# Patient Record
Sex: Female | Born: 1960 | Race: Black or African American | Hispanic: No | Marital: Married | Smoking: Never smoker
Health system: Southern US, Community
[De-identification: ages and names within clinical notes are randomized; demographics above are authoritative.]

## PROBLEM LIST (undated history)

## (undated) DIAGNOSIS — E739 Lactose intolerance, unspecified: Secondary | ICD-10-CM

## (undated) DIAGNOSIS — J439 Emphysema, unspecified: Secondary | ICD-10-CM

## (undated) DIAGNOSIS — M545 Low back pain, unspecified: Secondary | ICD-10-CM

## (undated) DIAGNOSIS — G43909 Migraine, unspecified, not intractable, without status migrainosus: Secondary | ICD-10-CM

## (undated) DIAGNOSIS — I10 Essential (primary) hypertension: Secondary | ICD-10-CM

## (undated) DIAGNOSIS — Z808 Family history of malignant neoplasm of other organs or systems: Secondary | ICD-10-CM

## (undated) DIAGNOSIS — J42 Unspecified chronic bronchitis: Secondary | ICD-10-CM

## (undated) DIAGNOSIS — M109 Gout, unspecified: Secondary | ICD-10-CM

## (undated) DIAGNOSIS — F32A Depression, unspecified: Secondary | ICD-10-CM

## (undated) DIAGNOSIS — K219 Gastro-esophageal reflux disease without esophagitis: Secondary | ICD-10-CM

## (undated) DIAGNOSIS — R609 Edema, unspecified: Secondary | ICD-10-CM

## (undated) DIAGNOSIS — Z8042 Family history of malignant neoplasm of prostate: Secondary | ICD-10-CM

## (undated) DIAGNOSIS — R569 Unspecified convulsions: Secondary | ICD-10-CM

## (undated) DIAGNOSIS — M069 Rheumatoid arthritis, unspecified: Secondary | ICD-10-CM

## (undated) DIAGNOSIS — F419 Anxiety disorder, unspecified: Secondary | ICD-10-CM

## (undated) DIAGNOSIS — F319 Bipolar disorder, unspecified: Secondary | ICD-10-CM

## (undated) DIAGNOSIS — E119 Type 2 diabetes mellitus without complications: Secondary | ICD-10-CM

## (undated) DIAGNOSIS — E785 Hyperlipidemia, unspecified: Secondary | ICD-10-CM

## (undated) DIAGNOSIS — R079 Chest pain, unspecified: Secondary | ICD-10-CM

## (undated) DIAGNOSIS — J449 Chronic obstructive pulmonary disease, unspecified: Secondary | ICD-10-CM

## (undated) DIAGNOSIS — M1990 Unspecified osteoarthritis, unspecified site: Secondary | ICD-10-CM

## (undated) DIAGNOSIS — Z800 Family history of malignant neoplasm of digestive organs: Secondary | ICD-10-CM

## (undated) DIAGNOSIS — J984 Other disorders of lung: Secondary | ICD-10-CM

## (undated) DIAGNOSIS — G8929 Other chronic pain: Secondary | ICD-10-CM

## (undated) DIAGNOSIS — F329 Major depressive disorder, single episode, unspecified: Secondary | ICD-10-CM

## (undated) DIAGNOSIS — Z6841 Body Mass Index (BMI) 40.0 and over, adult: Secondary | ICD-10-CM

## (undated) DIAGNOSIS — C50919 Malignant neoplasm of unspecified site of unspecified female breast: Secondary | ICD-10-CM

## (undated) DIAGNOSIS — Z9289 Personal history of other medical treatment: Secondary | ICD-10-CM

## (undated) DIAGNOSIS — Z803 Family history of malignant neoplasm of breast: Secondary | ICD-10-CM

## (undated) HISTORY — DX: Family history of malignant neoplasm of other organs or systems: Z80.8

## (undated) HISTORY — DX: Body Mass Index (BMI) 40.0 and over, adult: Z684

## (undated) HISTORY — DX: Malignant neoplasm of unspecified site of unspecified female breast: C50.919

## (undated) HISTORY — DX: Edema, unspecified: R60.9

## (undated) HISTORY — DX: Gout, unspecified: M10.9

## (undated) HISTORY — DX: Other disorders of lung: J98.4

## (undated) HISTORY — DX: Gastro-esophageal reflux disease without esophagitis: K21.9

## (undated) HISTORY — DX: Morbid (severe) obesity due to excess calories: E66.01

## (undated) HISTORY — DX: Hyperlipidemia, unspecified: E78.5

## (undated) HISTORY — DX: Lactose intolerance, unspecified: E73.9

## (undated) HISTORY — PX: BREAST LUMPECTOMY: SHX2

## (undated) HISTORY — DX: Bipolar disorder, unspecified: F31.9

## (undated) HISTORY — DX: Emphysema, unspecified: J43.9

## (undated) HISTORY — DX: Chest pain, unspecified: R07.9

## (undated) HISTORY — DX: Family history of malignant neoplasm of prostate: Z80.42

## (undated) HISTORY — DX: Other disorders of lung: J44.9

## (undated) HISTORY — DX: Family history of malignant neoplasm of breast: Z80.3

## (undated) HISTORY — DX: Chronic obstructive pulmonary disease, unspecified: J44.9

## (undated) HISTORY — DX: Personal history of other medical treatment: Z92.89

## (undated) HISTORY — PX: CRANIOTOMY: SHX93

## (undated) HISTORY — DX: Family history of malignant neoplasm of digestive organs: Z80.0

---

## 1998-02-14 ENCOUNTER — Encounter: Payer: Self-pay | Admitting: Pulmonary Disease

## 1998-02-14 ENCOUNTER — Ambulatory Visit (HOSPITAL_COMMUNITY): Admission: RE | Admit: 1998-02-14 | Discharge: 1998-02-14 | Payer: Self-pay | Admitting: Pulmonary Disease

## 2003-11-27 ENCOUNTER — Ambulatory Visit (HOSPITAL_COMMUNITY): Admission: RE | Admit: 2003-11-27 | Discharge: 2003-11-27 | Payer: Self-pay | Admitting: Family Medicine

## 2003-12-05 ENCOUNTER — Ambulatory Visit: Payer: Self-pay | Admitting: *Deleted

## 2003-12-07 ENCOUNTER — Emergency Department (HOSPITAL_COMMUNITY): Admission: EM | Admit: 2003-12-07 | Discharge: 2003-12-07 | Payer: Self-pay | Admitting: *Deleted

## 2003-12-19 ENCOUNTER — Ambulatory Visit: Payer: Self-pay | Admitting: *Deleted

## 2003-12-25 ENCOUNTER — Ambulatory Visit: Payer: Self-pay | Admitting: *Deleted

## 2004-01-23 ENCOUNTER — Ambulatory Visit: Payer: Self-pay | Admitting: Internal Medicine

## 2004-02-26 ENCOUNTER — Ambulatory Visit: Payer: Self-pay | Admitting: Internal Medicine

## 2004-05-10 ENCOUNTER — Ambulatory Visit (HOSPITAL_COMMUNITY): Admission: RE | Admit: 2004-05-10 | Discharge: 2004-05-10 | Payer: Self-pay | Admitting: Internal Medicine

## 2004-05-10 ENCOUNTER — Ambulatory Visit: Payer: Self-pay | Admitting: Family Medicine

## 2004-05-16 ENCOUNTER — Ambulatory Visit: Payer: Self-pay | Admitting: Family Medicine

## 2004-07-30 ENCOUNTER — Ambulatory Visit: Payer: Self-pay | Admitting: Internal Medicine

## 2004-08-01 ENCOUNTER — Ambulatory Visit: Payer: Self-pay | Admitting: Internal Medicine

## 2004-10-15 ENCOUNTER — Ambulatory Visit: Payer: Self-pay | Admitting: Internal Medicine

## 2004-12-23 ENCOUNTER — Ambulatory Visit: Payer: Self-pay | Admitting: Internal Medicine

## 2005-01-06 ENCOUNTER — Ambulatory Visit: Payer: Self-pay | Admitting: Family Medicine

## 2005-01-09 ENCOUNTER — Ambulatory Visit (HOSPITAL_COMMUNITY): Admission: RE | Admit: 2005-01-09 | Discharge: 2005-01-09 | Payer: Self-pay | Admitting: Internal Medicine

## 2005-04-21 ENCOUNTER — Ambulatory Visit: Payer: Self-pay | Admitting: Internal Medicine

## 2005-05-19 ENCOUNTER — Ambulatory Visit: Payer: Self-pay | Admitting: Internal Medicine

## 2005-05-20 ENCOUNTER — Ambulatory Visit: Payer: Self-pay | Admitting: Internal Medicine

## 2005-05-20 ENCOUNTER — Encounter: Payer: Self-pay | Admitting: Internal Medicine

## 2005-05-26 ENCOUNTER — Ambulatory Visit: Payer: Self-pay | Admitting: Family Medicine

## 2005-05-28 ENCOUNTER — Ambulatory Visit (HOSPITAL_COMMUNITY): Admission: RE | Admit: 2005-05-28 | Discharge: 2005-05-28 | Payer: Self-pay | Admitting: Family Medicine

## 2005-06-26 ENCOUNTER — Ambulatory Visit: Payer: Self-pay | Admitting: Internal Medicine

## 2005-07-09 ENCOUNTER — Ambulatory Visit: Payer: Self-pay | Admitting: Internal Medicine

## 2005-07-09 ENCOUNTER — Ambulatory Visit (HOSPITAL_COMMUNITY): Admission: RE | Admit: 2005-07-09 | Discharge: 2005-07-09 | Payer: Self-pay | Admitting: Internal Medicine

## 2005-10-29 ENCOUNTER — Ambulatory Visit: Payer: Self-pay | Admitting: Family Medicine

## 2005-12-23 ENCOUNTER — Ambulatory Visit: Payer: Self-pay | Admitting: Internal Medicine

## 2005-12-24 ENCOUNTER — Ambulatory Visit: Payer: Self-pay | Admitting: Family Medicine

## 2006-05-29 ENCOUNTER — Ambulatory Visit (HOSPITAL_COMMUNITY): Admission: RE | Admit: 2006-05-29 | Discharge: 2006-05-29 | Payer: Self-pay | Admitting: Family Medicine

## 2006-06-12 ENCOUNTER — Encounter: Admission: RE | Admit: 2006-06-12 | Discharge: 2006-06-12 | Payer: Self-pay | Admitting: Family Medicine

## 2006-06-16 ENCOUNTER — Ambulatory Visit: Payer: Self-pay | Admitting: Internal Medicine

## 2006-07-01 ENCOUNTER — Ambulatory Visit: Payer: Self-pay | Admitting: Family Medicine

## 2006-07-29 ENCOUNTER — Ambulatory Visit: Payer: Self-pay | Admitting: Family Medicine

## 2006-10-19 ENCOUNTER — Ambulatory Visit: Payer: Self-pay | Admitting: Internal Medicine

## 2006-10-20 DIAGNOSIS — I10 Essential (primary) hypertension: Secondary | ICD-10-CM

## 2006-10-20 DIAGNOSIS — E1159 Type 2 diabetes mellitus with other circulatory complications: Secondary | ICD-10-CM | POA: Insufficient documentation

## 2006-11-03 DIAGNOSIS — F3289 Other specified depressive episodes: Secondary | ICD-10-CM | POA: Insufficient documentation

## 2006-11-03 DIAGNOSIS — F329 Major depressive disorder, single episode, unspecified: Secondary | ICD-10-CM

## 2006-12-16 ENCOUNTER — Encounter (INDEPENDENT_AMBULATORY_CARE_PROVIDER_SITE_OTHER): Payer: Self-pay | Admitting: *Deleted

## 2007-01-26 ENCOUNTER — Ambulatory Visit: Payer: Self-pay | Admitting: Internal Medicine

## 2007-06-28 ENCOUNTER — Ambulatory Visit: Payer: Self-pay | Admitting: Internal Medicine

## 2007-07-15 ENCOUNTER — Encounter (INDEPENDENT_AMBULATORY_CARE_PROVIDER_SITE_OTHER): Payer: Self-pay | Admitting: Family Medicine

## 2007-07-15 ENCOUNTER — Ambulatory Visit: Payer: Self-pay | Admitting: Internal Medicine

## 2007-07-15 LAB — CONVERTED CEMR LAB
Albumin: 3.6 g/dL (ref 3.5–5.2)
CO2: 24 meq/L (ref 19–32)
Calcium: 8.9 mg/dL (ref 8.4–10.5)
Chloride: 105 meq/L (ref 96–112)
Creatinine, Ser: 0.76 mg/dL (ref 0.40–1.20)
Glucose, Bld: 96 mg/dL (ref 70–99)
Total Protein: 7.6 g/dL (ref 6.0–8.3)
Triglycerides: 182 mg/dL — ABNORMAL HIGH (ref <150)
VLDL: 36 mg/dL (ref 0–40)

## 2007-08-24 ENCOUNTER — Emergency Department (HOSPITAL_COMMUNITY): Admission: EM | Admit: 2007-08-24 | Discharge: 2007-08-24 | Payer: Self-pay | Admitting: Emergency Medicine

## 2007-09-08 ENCOUNTER — Ambulatory Visit: Payer: Self-pay | Admitting: Internal Medicine

## 2008-01-11 ENCOUNTER — Ambulatory Visit: Payer: Self-pay | Admitting: Internal Medicine

## 2008-01-11 LAB — CONVERTED CEMR LAB
ALT: 18 units/L (ref 0–35)
Albumin: 3.8 g/dL (ref 3.5–5.2)
Chloride: 105 meq/L (ref 96–112)
Creatinine, Ser: 0.71 mg/dL (ref 0.40–1.20)
Eosinophils Absolute: 0.1 10*3/uL (ref 0.0–0.7)
Glucose, Bld: 96 mg/dL (ref 70–99)
Hemoglobin: 12.6 g/dL (ref 12.0–15.0)
Lymphocytes Relative: 39 % (ref 12–46)
MCHC: 32.9 g/dL (ref 30.0–36.0)
MCV: 93 fL (ref 78.0–100.0)
Monocytes Relative: 10 % (ref 3–12)
Potassium: 4.3 meq/L (ref 3.5–5.3)
RDW: 15.8 % — ABNORMAL HIGH (ref 11.5–15.5)
Sodium: 139 meq/L (ref 135–145)
WBC: 7.1 10*3/uL (ref 4.0–10.5)

## 2008-01-14 ENCOUNTER — Encounter: Admission: RE | Admit: 2008-01-14 | Discharge: 2008-01-14 | Payer: Self-pay | Admitting: Internal Medicine

## 2008-01-31 ENCOUNTER — Ambulatory Visit: Payer: Self-pay | Admitting: Family Medicine

## 2008-02-15 ENCOUNTER — Ambulatory Visit: Payer: Self-pay | Admitting: Family Medicine

## 2008-03-02 ENCOUNTER — Ambulatory Visit: Payer: Self-pay | Admitting: Internal Medicine

## 2008-03-02 ENCOUNTER — Encounter (INDEPENDENT_AMBULATORY_CARE_PROVIDER_SITE_OTHER): Payer: Self-pay | Admitting: Adult Health

## 2008-03-02 LAB — CONVERTED CEMR LAB
Basophils Relative: 0 % (ref 0–1)
HCT: 41 % (ref 36.0–46.0)
Hemoglobin: 13.7 g/dL (ref 12.0–15.0)
Lymphocytes Relative: 45 % (ref 12–46)
MCHC: 33.4 g/dL (ref 30.0–36.0)
Platelets: 351 10*3/uL (ref 150–400)
RBC: 4.34 M/uL (ref 3.87–5.11)
WBC: 8.5 10*3/uL (ref 4.0–10.5)

## 2008-03-08 ENCOUNTER — Ambulatory Visit (HOSPITAL_COMMUNITY): Admission: RE | Admit: 2008-03-08 | Discharge: 2008-03-08 | Payer: Self-pay | Admitting: Internal Medicine

## 2008-03-14 ENCOUNTER — Ambulatory Visit (HOSPITAL_COMMUNITY): Admission: RE | Admit: 2008-03-14 | Discharge: 2008-03-14 | Payer: Self-pay | Admitting: Internal Medicine

## 2008-03-14 ENCOUNTER — Encounter: Payer: Self-pay | Admitting: Internal Medicine

## 2008-04-10 ENCOUNTER — Ambulatory Visit (HOSPITAL_COMMUNITY): Admission: RE | Admit: 2008-04-10 | Discharge: 2008-04-10 | Payer: Self-pay | Admitting: Internal Medicine

## 2008-08-18 ENCOUNTER — Ambulatory Visit: Payer: Self-pay | Admitting: Internal Medicine

## 2008-08-22 ENCOUNTER — Ambulatory Visit: Payer: Self-pay | Admitting: Internal Medicine

## 2008-09-29 ENCOUNTER — Ambulatory Visit: Payer: Self-pay | Admitting: Internal Medicine

## 2008-10-04 ENCOUNTER — Ambulatory Visit: Payer: Self-pay | Admitting: Internal Medicine

## 2008-11-23 ENCOUNTER — Telehealth (INDEPENDENT_AMBULATORY_CARE_PROVIDER_SITE_OTHER): Payer: Self-pay | Admitting: *Deleted

## 2008-11-23 ENCOUNTER — Ambulatory Visit: Payer: Self-pay | Admitting: Internal Medicine

## 2008-11-24 ENCOUNTER — Encounter (INDEPENDENT_AMBULATORY_CARE_PROVIDER_SITE_OTHER): Payer: Self-pay | Admitting: Adult Health

## 2008-11-24 ENCOUNTER — Ambulatory Visit: Payer: Self-pay | Admitting: Internal Medicine

## 2008-11-24 LAB — CONVERTED CEMR LAB
Lymphs Abs: 4 10*3/uL (ref 0.7–4.0)
MCV: 95.8 fL (ref 78.0–100.0)
Neutrophils Relative %: 42 % — ABNORMAL LOW (ref 43–77)
Platelets: 317 10*3/uL (ref 150–400)
RBC: 4.05 M/uL (ref 3.87–5.11)
RDW: 15.4 % (ref 11.5–15.5)
WBC: 7.6 10*3/uL (ref 4.0–10.5)

## 2008-11-28 ENCOUNTER — Encounter: Admission: RE | Admit: 2008-11-28 | Discharge: 2008-11-28 | Payer: Self-pay | Admitting: Internal Medicine

## 2008-12-07 ENCOUNTER — Ambulatory Visit: Payer: Self-pay | Admitting: Internal Medicine

## 2008-12-07 ENCOUNTER — Encounter (INDEPENDENT_AMBULATORY_CARE_PROVIDER_SITE_OTHER): Payer: Self-pay | Admitting: Adult Health

## 2008-12-07 LAB — CONVERTED CEMR LAB
ALT: 21 units/L (ref 0–35)
Albumin: 3.6 g/dL (ref 3.5–5.2)
BUN: 9 mg/dL (ref 6–23)
Calcium: 8.8 mg/dL (ref 8.4–10.5)
Cholesterol: 138 mg/dL (ref 0–200)
Glucose, Bld: 104 mg/dL — ABNORMAL HIGH (ref 70–99)
HDL: 71 mg/dL (ref >39)
LDL Cholesterol: 39 mg/dL (ref 0–99)
Sodium: 141 meq/L (ref 135–145)
Total Bilirubin: 0.2 mg/dL — ABNORMAL LOW (ref 0.3–1.2)
Total CHOL/HDL Ratio: 1.9
Triglycerides: 139 mg/dL (ref <150)
VLDL: 28 mg/dL (ref 0–40)

## 2009-01-01 ENCOUNTER — Ambulatory Visit: Payer: Self-pay | Admitting: Family Medicine

## 2009-01-31 ENCOUNTER — Ambulatory Visit: Payer: Self-pay | Admitting: Family Medicine

## 2009-01-31 ENCOUNTER — Encounter (INDEPENDENT_AMBULATORY_CARE_PROVIDER_SITE_OTHER): Payer: Self-pay | Admitting: Adult Health

## 2009-02-07 ENCOUNTER — Ambulatory Visit (HOSPITAL_COMMUNITY): Admission: RE | Admit: 2009-02-07 | Discharge: 2009-02-07 | Payer: Self-pay | Admitting: Internal Medicine

## 2009-02-14 ENCOUNTER — Ambulatory Visit: Payer: Self-pay | Admitting: Internal Medicine

## 2009-02-21 ENCOUNTER — Ambulatory Visit: Payer: Self-pay | Admitting: Internal Medicine

## 2009-02-21 ENCOUNTER — Encounter (INDEPENDENT_AMBULATORY_CARE_PROVIDER_SITE_OTHER): Payer: Self-pay | Admitting: Adult Health

## 2009-02-21 LAB — CONVERTED CEMR LAB: Valproic Acid Lvl: 50.8 ug/mL (ref 50.0–100.0)

## 2009-03-09 ENCOUNTER — Ambulatory Visit: Payer: Self-pay | Admitting: Family Medicine

## 2009-03-27 ENCOUNTER — Ambulatory Visit: Payer: Self-pay | Admitting: Internal Medicine

## 2009-05-08 ENCOUNTER — Encounter (INDEPENDENT_AMBULATORY_CARE_PROVIDER_SITE_OTHER): Payer: Self-pay | Admitting: Adult Health

## 2009-05-08 ENCOUNTER — Ambulatory Visit: Payer: Self-pay | Admitting: Internal Medicine

## 2009-05-08 LAB — CONVERTED CEMR LAB
Albumin: 4.1 g/dL (ref 3.5–5.2)
BUN: 7 mg/dL (ref 6–23)
HDL: 84 mg/dL (ref >39)
LDL Cholesterol: 35 mg/dL (ref 0–99)
Sodium: 140 meq/L (ref 135–145)
Total CHOL/HDL Ratio: 1.6
VLDL: 16 mg/dL (ref 0–40)

## 2009-05-11 ENCOUNTER — Ambulatory Visit: Payer: Self-pay | Admitting: Internal Medicine

## 2009-06-08 ENCOUNTER — Ambulatory Visit: Payer: Self-pay | Admitting: Internal Medicine

## 2009-06-08 ENCOUNTER — Encounter (INDEPENDENT_AMBULATORY_CARE_PROVIDER_SITE_OTHER): Payer: Self-pay | Admitting: Adult Health

## 2009-06-08 LAB — CONVERTED CEMR LAB
ALT: 19 U/L
AST: 20 U/L
Albumin: 3.8 g/dL
Alkaline Phosphatase: 59 U/L
BUN: 12 mg/dL
Basophils Absolute: 0 K/uL
Basophils Relative: 0 %
CO2: 24 meq/L
Calcium: 9.3 mg/dL
Chloride: 103 meq/L
Creatinine, Ser: 0.83 mg/dL
Eosinophils Absolute: 0.1 K/uL
Eosinophils Relative: 2 %
FSH: 39.9 m[IU]/mL
Glucose, Bld: 100 mg/dL — ABNORMAL HIGH
HCT: 38.7 %
Hemoglobin: 12.7 g/dL
LH: 23.2 m[IU]/mL
Lymphocytes Relative: 46 %
Lymphs Abs: 3 K/uL
MCHC: 32.8 g/dL
MCV: 94.4 fL
Monocytes Absolute: 0.6 K/uL
Monocytes Relative: 8 %
Neutro Abs: 2.8 K/uL
Neutrophils Relative %: 43 %
Platelets: 269 K/uL
Potassium: 3.9 meq/L
RBC: 4.1 M/uL
RDW: 14.3 %
Sodium: 138 meq/L
TSH: 1.296 u[IU]/mL
Total Bilirubin: 0.3 mg/dL
Total Protein: 7.3 g/dL
WBC: 6.5 10*3/microliter

## 2009-06-14 ENCOUNTER — Ambulatory Visit: Payer: Self-pay | Admitting: Internal Medicine

## 2009-07-16 ENCOUNTER — Ambulatory Visit: Payer: Self-pay | Admitting: Internal Medicine

## 2009-07-27 ENCOUNTER — Ambulatory Visit: Payer: Self-pay | Admitting: Internal Medicine

## 2009-07-31 ENCOUNTER — Ambulatory Visit: Payer: Self-pay | Admitting: Internal Medicine

## 2009-08-07 ENCOUNTER — Ambulatory Visit: Payer: Self-pay | Admitting: Internal Medicine

## 2009-08-08 ENCOUNTER — Ambulatory Visit: Payer: Self-pay | Admitting: Internal Medicine

## 2009-10-12 ENCOUNTER — Ambulatory Visit: Payer: Self-pay | Admitting: Internal Medicine

## 2009-10-17 ENCOUNTER — Ambulatory Visit: Payer: Self-pay | Admitting: Internal Medicine

## 2009-11-08 ENCOUNTER — Ambulatory Visit: Payer: Self-pay | Admitting: Internal Medicine

## 2009-12-12 ENCOUNTER — Ambulatory Visit: Payer: Self-pay | Admitting: Internal Medicine

## 2010-01-16 ENCOUNTER — Encounter (INDEPENDENT_AMBULATORY_CARE_PROVIDER_SITE_OTHER): Payer: Self-pay | Admitting: *Deleted

## 2010-01-16 LAB — CONVERTED CEMR LAB
ALT: 19 units/L (ref 0–35)
AST: 23 units/L (ref 0–37)
Albumin: 3.6 g/dL (ref 3.5–5.2)
Alkaline Phosphatase: 71 units/L (ref 39–117)
BUN: 13 mg/dL (ref 6–23)
Basophils Absolute: 0 10*3/uL (ref 0.0–0.1)
CO2: 25 meq/L (ref 19–32)
Calcium: 9.1 mg/dL (ref 8.4–10.5)
Chloride: 105 meq/L (ref 96–112)
Neutro Abs: 4.2 10*3/uL (ref 1.7–7.7)
Platelets: 346 10*3/uL (ref 150–400)
Sodium: 139 meq/L (ref 135–145)
Total Protein: 6.7 g/dL (ref 6.0–8.3)
Valproic Acid Lvl: 47.8 ug/mL — ABNORMAL LOW (ref 50.0–100.0)

## 2010-02-08 ENCOUNTER — Ambulatory Visit (HOSPITAL_COMMUNITY): Admission: RE | Admit: 2010-02-08 | Discharge: 2010-02-08 | Payer: Self-pay | Admitting: Internal Medicine

## 2010-02-15 ENCOUNTER — Ambulatory Visit (HOSPITAL_BASED_OUTPATIENT_CLINIC_OR_DEPARTMENT_OTHER)
Admission: RE | Admit: 2010-02-15 | Discharge: 2010-02-15 | Payer: Self-pay | Source: Home / Self Care | Admitting: Family Medicine

## 2010-02-23 ENCOUNTER — Ambulatory Visit: Payer: Self-pay | Admitting: Internal Medicine

## 2010-03-13 ENCOUNTER — Encounter (INDEPENDENT_AMBULATORY_CARE_PROVIDER_SITE_OTHER): Payer: Self-pay | Admitting: Family Medicine

## 2010-03-13 LAB — CONVERTED CEMR LAB
Alkaline Phosphatase: 79 units/L (ref 39–117)
CO2: 28 meq/L (ref 19–32)
Eosinophils Absolute: 0.1 10*3/uL (ref 0.0–0.7)
Eosinophils Relative: 2 % (ref 0–5)
HCT: 39.1 % (ref 36.0–46.0)
Hemoglobin: 12.6 g/dL (ref 12.0–15.0)
Lymphocytes Relative: 39 % (ref 12–46)
MCHC: 32.2 g/dL (ref 30.0–36.0)
MCV: 97 fL (ref 78.0–100.0)
Monocytes Relative: 9 % (ref 3–12)
Neutrophils Relative %: 50 % (ref 43–77)
Platelets: 358 10*3/uL (ref 150–400)
RBC: 4.03 M/uL (ref 3.87–5.11)
RDW: 14.9 % (ref 11.5–15.5)
Sodium: 141 meq/L (ref 135–145)
WBC: 7.7 10*3/uL (ref 4.0–10.5)

## 2010-07-19 ENCOUNTER — Ambulatory Visit (INDEPENDENT_AMBULATORY_CARE_PROVIDER_SITE_OTHER): Payer: Self-pay

## 2010-07-19 ENCOUNTER — Inpatient Hospital Stay (INDEPENDENT_AMBULATORY_CARE_PROVIDER_SITE_OTHER)
Admission: RE | Admit: 2010-07-19 | Discharge: 2010-07-19 | Disposition: A | Discharge status: Home / Self Care | Payer: Self-pay | Source: Ambulatory Visit | Attending: Family Medicine | Admitting: Family Medicine

## 2010-07-19 DIAGNOSIS — R05 Cough: Secondary | ICD-10-CM

## 2010-07-19 DIAGNOSIS — R059 Cough, unspecified: Secondary | ICD-10-CM

## 2010-07-19 LAB — POCT I-STAT, CHEM 8
Calcium, Ion: 1.21 mmol/L (ref 1.12–1.32)
Creatinine, Ser: 1.1 mg/dL (ref 0.4–1.2)
Glucose, Bld: 80 mg/dL (ref 70–99)
Sodium: 141 mEq/L (ref 135–145)

## 2010-07-19 LAB — GLUCOSE, CAPILLARY: Glucose-Capillary: 79 mg/dL (ref 70–99)

## 2010-07-21 ENCOUNTER — Emergency Department (HOSPITAL_COMMUNITY)
Admission: EM | Admit: 2010-07-21 | Discharge: 2010-07-21 | Disposition: A | Discharge status: Home / Self Care | Payer: Self-pay | Attending: Emergency Medicine | Admitting: Emergency Medicine

## 2010-07-21 DIAGNOSIS — R609 Edema, unspecified: Secondary | ICD-10-CM | POA: Insufficient documentation

## 2010-07-21 DIAGNOSIS — R071 Chest pain on breathing: Secondary | ICD-10-CM | POA: Insufficient documentation

## 2010-07-21 DIAGNOSIS — E785 Hyperlipidemia, unspecified: Secondary | ICD-10-CM | POA: Insufficient documentation

## 2010-07-21 DIAGNOSIS — R079 Chest pain, unspecified: Secondary | ICD-10-CM | POA: Insufficient documentation

## 2010-07-21 DIAGNOSIS — R51 Headache: Secondary | ICD-10-CM | POA: Insufficient documentation

## 2010-07-21 DIAGNOSIS — R634 Abnormal weight loss: Secondary | ICD-10-CM | POA: Insufficient documentation

## 2010-07-21 DIAGNOSIS — Z79899 Other long term (current) drug therapy: Secondary | ICD-10-CM | POA: Insufficient documentation

## 2010-07-21 DIAGNOSIS — R059 Cough, unspecified: Secondary | ICD-10-CM | POA: Insufficient documentation

## 2010-07-21 DIAGNOSIS — K117 Disturbances of salivary secretion: Secondary | ICD-10-CM | POA: Insufficient documentation

## 2010-07-21 DIAGNOSIS — R05 Cough: Secondary | ICD-10-CM | POA: Insufficient documentation

## 2010-07-21 DIAGNOSIS — R350 Frequency of micturition: Secondary | ICD-10-CM | POA: Insufficient documentation

## 2010-07-21 DIAGNOSIS — H16219 Exposure keratoconjunctivitis, unspecified eye: Secondary | ICD-10-CM | POA: Insufficient documentation

## 2010-07-21 DIAGNOSIS — E119 Type 2 diabetes mellitus without complications: Secondary | ICD-10-CM | POA: Insufficient documentation

## 2010-07-21 DIAGNOSIS — I10 Essential (primary) hypertension: Secondary | ICD-10-CM | POA: Insufficient documentation

## 2010-07-21 DIAGNOSIS — R3915 Urgency of urination: Secondary | ICD-10-CM | POA: Insufficient documentation

## 2010-07-21 LAB — URINALYSIS, ROUTINE W REFLEX MICROSCOPIC
Bilirubin Urine: NEGATIVE
Glucose, UA: NEGATIVE mg/dL
Hgb urine dipstick: NEGATIVE
Ketones, ur: NEGATIVE mg/dL
Nitrite: NEGATIVE
Protein, ur: NEGATIVE mg/dL
Urobilinogen, UA: 0.2 mg/dL (ref 0.0–1.0)
pH: 7.5 (ref 5.0–8.0)

## 2010-07-21 LAB — DIFFERENTIAL
Basophils Absolute: 0 10*3/uL (ref 0.0–0.1)
Basophils Relative: 0 % (ref 0–1)
Eosinophils Relative: 2 % (ref 0–5)
Lymphocytes Relative: 41 % (ref 12–46)
Monocytes Absolute: 0.7 10*3/uL (ref 0.1–1.0)
Neutrophils Relative %: 49 % (ref 43–77)

## 2010-07-21 LAB — BASIC METABOLIC PANEL
GFR calc Af Amer: >60 mL/min (ref >60)
GFR calc non Af Amer: >60 mL/min (ref >60)
Glucose, Bld: 89 mg/dL (ref 70–99)
Potassium: 3.5 mEq/L (ref 3.5–5.1)
Sodium: 139 mEq/L (ref 135–145)

## 2010-07-21 LAB — CBC
Hemoglobin: 13.9 g/dL (ref 12.0–15.0)
MCH: 32.2 pg (ref 26.0–34.0)
MCV: 92.8 fL (ref 78.0–100.0)
WBC: 9.2 10*3/uL (ref 4.0–10.5)

## 2010-07-21 LAB — D-DIMER, QUANTITATIVE: D-Dimer, Quant: <0.22 ug/mL-FEU (ref 0.00–0.48)

## 2010-10-30 HISTORY — PX: CATARACT EXTRACTION: SUR2

## 2010-11-20 ENCOUNTER — Ambulatory Visit (HOSPITAL_COMMUNITY)
Admission: RE | Admit: 2010-11-20 | Discharge: 2010-11-20 | Disposition: A | Discharge status: Home / Self Care | Payer: Self-pay | Source: Ambulatory Visit | Attending: Ophthalmology | Admitting: Ophthalmology

## 2010-11-20 DIAGNOSIS — Z01812 Encounter for preprocedural laboratory examination: Secondary | ICD-10-CM | POA: Insufficient documentation

## 2010-11-20 DIAGNOSIS — H269 Unspecified cataract: Secondary | ICD-10-CM | POA: Insufficient documentation

## 2010-11-20 DIAGNOSIS — G4733 Obstructive sleep apnea (adult) (pediatric): Secondary | ICD-10-CM | POA: Insufficient documentation

## 2010-11-20 DIAGNOSIS — K449 Diaphragmatic hernia without obstruction or gangrene: Secondary | ICD-10-CM | POA: Insufficient documentation

## 2010-11-20 DIAGNOSIS — I10 Essential (primary) hypertension: Secondary | ICD-10-CM | POA: Insufficient documentation

## 2010-11-20 DIAGNOSIS — Z01810 Encounter for preprocedural cardiovascular examination: Secondary | ICD-10-CM | POA: Insufficient documentation

## 2010-11-20 DIAGNOSIS — F319 Bipolar disorder, unspecified: Secondary | ICD-10-CM | POA: Insufficient documentation

## 2010-11-20 DIAGNOSIS — E119 Type 2 diabetes mellitus without complications: Secondary | ICD-10-CM | POA: Insufficient documentation

## 2010-11-20 LAB — BASIC METABOLIC PANEL
CO2: 28 mEq/L (ref 19–32)
Chloride: 105 mEq/L (ref 96–112)
Creatinine, Ser: 0.7 mg/dL (ref 0.50–1.10)
Potassium: 3.9 mEq/L (ref 3.5–5.1)
Sodium: 143 mEq/L (ref 135–145)

## 2010-11-20 LAB — CBC
Hemoglobin: 13.8 g/dL (ref 12.0–15.0)
MCH: 32.9 pg (ref 26.0–34.0)
RBC: 4.2 MIL/uL (ref 3.87–5.11)
WBC: 8 10*3/uL (ref 4.0–10.5)

## 2010-11-20 LAB — GLUCOSE, CAPILLARY
Glucose-Capillary: 58 mg/dL — ABNORMAL LOW (ref 70–99)
Glucose-Capillary: 59 mg/dL — ABNORMAL LOW (ref 70–99)
Glucose-Capillary: 92 mg/dL (ref 70–99)

## 2010-11-20 LAB — HCG, SERUM, QUALITATIVE: Preg, Serum: NEGATIVE

## 2010-12-18 NOTE — Op Note (Signed)
  NAMEBROOKS, STOTZ               ACCOUNT NO.:  [id number]  MEDICAL RECORD NO.:  [id number]  LOCATION:  SDSC                         FACILITY:  [facility]  PHYSICIAN:  Shade Flood, MD       DATE OF BIRTH:  [birth year]  DATE OF PROCEDURE:  11/20/2010 DATE OF DISCHARGE:                              OPERATIVE REPORT   PREOPERATIVE DIAGNOSIS:  Cataract, left eye.  POSTOPERATIVE DIAGNOSIS:  Cataract, left eye.  PROCEDURE PERFORMED:  Phacoemulsification with posterior chamber intraocular lens, left eye.  SURGEON:  Shade Flood, MD.  ESTIMATED BLOOD LOSS:  Less than 1 mL.  There no complications and no specimens for pathology.  The patient was prepared and draped in the usual fashion for ocular surgery on the left eye and a solid lid speculum was placed.  A peripheral clear corneal incision was made centered at the 11 o'clock meridian for 3.5 mm used using a Grieshaber O1 blade.  A separate stab incision was made at the 2:30 meridian of the clear cornea using a 15- degree blade.  The keratome was then used to make a self-sealing corneal incision centered at the 11 o'clock meridian.  Provisc was instilled into the anterior chamber.  A bent 25-gauge needle was used to perform a capsulorrhexis.  The lens was then hydrodissected and hydro-delineated using the irrigation cannula.  The Chang chopper was inserted and used to rotate the lens within the capsular bag.  The outer cortex was noted to be very adherent, though I was able to remove the nucleus within the outer cortical shell.  The Chang chopper and phaco handpiece were inserted and a combined phaco chop technique was employed fracturing the lens nucleus into sections with subsequent removal with the phaco handpiece.  The IA cannula was then used to remove the thick cortical shell.  It was very adherent to the capsule, but we were able to remove it uneventfully.  The wound was then enlarged to its full extent of 3.5 mm and Amvisc  was instilled.  A Monarch injector was used to place folded AcrySOf MA50BM lens +20.5 into the capsule bag.  The Sinskey lens hook was then used to dial the trailing haptic into the capsular bag. The IA cannula was then used to remove viscoelastic from the anterior chamber.  Balanced salt solution was placed through the stab incision at 2:30 and used to reconstitute globe volume.  The incision at 11 o'clock was tested with Weck-cel was found to be watertight.  The patient was given 4 mg of Decadron subconjunctivally, 100 mg of Ancef subconjunctivally using a 30-gauge needle with the tip of the needle visualized.  The lid speculum was then removed and the patient's eye was patched using polymyxin, bacitracin ophthalmic ointment.  A plastic shield was placed and she was transferred alert and conversant from the operating room to postoperative recovery area.          ______________________________ Shade Flood, MD     GG/MEDQ  D:  11/20/2010  T:  11/21/2010  Job:  [id number]  Electronically Signed by Shade Flood MD on 12/18/2010 02:36:48 PM

## 2010-12-25 LAB — CBC
Platelets: 359
RDW: 15.2
WBC: 9.7

## 2010-12-25 LAB — BASIC METABOLIC PANEL
BUN: 6
Creatinine, Ser: 0.72
GFR calc non Af Amer: >60
Glucose, Bld: 111 — ABNORMAL HIGH

## 2011-01-21 ENCOUNTER — Ambulatory Visit: Payer: Self-pay | Attending: Family Medicine | Admitting: Rehabilitative and Restorative Service Providers"

## 2011-01-21 DIAGNOSIS — IMO0001 Reserved for inherently not codable concepts without codable children: Secondary | ICD-10-CM | POA: Insufficient documentation

## 2011-01-21 DIAGNOSIS — R262 Difficulty in walking, not elsewhere classified: Secondary | ICD-10-CM | POA: Insufficient documentation

## 2011-01-21 DIAGNOSIS — M6281 Muscle weakness (generalized): Secondary | ICD-10-CM | POA: Insufficient documentation

## 2011-01-21 DIAGNOSIS — M25559 Pain in unspecified hip: Secondary | ICD-10-CM | POA: Insufficient documentation

## 2011-01-23 ENCOUNTER — Encounter: Payer: Self-pay | Admitting: Rehabilitative and Restorative Service Providers"

## 2011-01-29 ENCOUNTER — Ambulatory Visit: Payer: Self-pay | Admitting: Rehabilitation

## 2011-01-30 ENCOUNTER — Other Ambulatory Visit (HOSPITAL_COMMUNITY): Payer: Self-pay | Admitting: Family Medicine

## 2011-01-30 DIAGNOSIS — Z1231 Encounter for screening mammogram for malignant neoplasm of breast: Secondary | ICD-10-CM

## 2011-01-31 ENCOUNTER — Ambulatory Visit: Payer: Self-pay | Attending: Family Medicine | Admitting: Rehabilitation

## 2011-01-31 DIAGNOSIS — M25559 Pain in unspecified hip: Secondary | ICD-10-CM | POA: Insufficient documentation

## 2011-01-31 DIAGNOSIS — M6281 Muscle weakness (generalized): Secondary | ICD-10-CM | POA: Insufficient documentation

## 2011-01-31 DIAGNOSIS — R262 Difficulty in walking, not elsewhere classified: Secondary | ICD-10-CM | POA: Insufficient documentation

## 2011-01-31 DIAGNOSIS — IMO0001 Reserved for inherently not codable concepts without codable children: Secondary | ICD-10-CM | POA: Insufficient documentation

## 2011-02-03 ENCOUNTER — Ambulatory Visit: Payer: Self-pay | Admitting: Obstetrics and Gynecology

## 2011-02-03 ENCOUNTER — Ambulatory Visit: Payer: Self-pay | Admitting: Rehabilitative and Restorative Service Providers"

## 2011-02-05 ENCOUNTER — Ambulatory Visit: Payer: Self-pay | Admitting: Rehabilitative and Restorative Service Providers"

## 2011-02-10 ENCOUNTER — Ambulatory Visit: Payer: Self-pay | Admitting: Rehabilitative and Restorative Service Providers"

## 2011-02-12 ENCOUNTER — Ambulatory Visit: Payer: Self-pay | Admitting: Rehabilitative and Restorative Service Providers"

## 2011-02-17 ENCOUNTER — Ambulatory Visit: Payer: Self-pay | Admitting: Rehabilitative and Restorative Service Providers"

## 2011-02-18 ENCOUNTER — Ambulatory Visit: Payer: Self-pay | Admitting: Physical Therapy

## 2011-02-21 ENCOUNTER — Ambulatory Visit (HOSPITAL_COMMUNITY): Payer: Self-pay | Attending: Family Medicine

## 2011-02-24 ENCOUNTER — Ambulatory Visit: Payer: Self-pay

## 2011-02-28 ENCOUNTER — Ambulatory Visit: Payer: Self-pay | Admitting: Rehabilitation

## 2011-03-04 ENCOUNTER — Ambulatory Visit: Payer: Self-pay | Attending: Family Medicine | Admitting: Rehabilitative and Restorative Service Providers"

## 2011-03-04 DIAGNOSIS — R262 Difficulty in walking, not elsewhere classified: Secondary | ICD-10-CM | POA: Insufficient documentation

## 2011-03-04 DIAGNOSIS — M25559 Pain in unspecified hip: Secondary | ICD-10-CM | POA: Insufficient documentation

## 2011-03-04 DIAGNOSIS — M6281 Muscle weakness (generalized): Secondary | ICD-10-CM | POA: Insufficient documentation

## 2011-03-04 DIAGNOSIS — IMO0001 Reserved for inherently not codable concepts without codable children: Secondary | ICD-10-CM | POA: Insufficient documentation

## 2011-03-06 ENCOUNTER — Ambulatory Visit: Payer: Self-pay | Admitting: Rehabilitative and Restorative Service Providers"

## 2011-05-21 ENCOUNTER — Emergency Department (INDEPENDENT_AMBULATORY_CARE_PROVIDER_SITE_OTHER)
Admission: EM | Admit: 2011-05-21 | Discharge: 2011-05-21 | Disposition: A | Discharge status: Home / Self Care | Payer: Self-pay | Source: Home / Self Care | Attending: Emergency Medicine | Admitting: Emergency Medicine

## 2011-05-21 ENCOUNTER — Encounter (HOSPITAL_COMMUNITY): Payer: Self-pay | Admitting: *Deleted

## 2011-05-21 DIAGNOSIS — J069 Acute upper respiratory infection, unspecified: Secondary | ICD-10-CM

## 2011-05-21 HISTORY — DX: Essential (primary) hypertension: I10

## 2011-05-21 HISTORY — DX: Unspecified osteoarthritis, unspecified site: M19.90

## 2011-05-21 LAB — POCT I-STAT, CHEM 8
BUN: 12 mg/dL (ref 6–23)
Creatinine, Ser: 0.8 mg/dL (ref 0.50–1.10)
Hemoglobin: 14.3 g/dL (ref 12.0–15.0)
Potassium: 3.7 mEq/L (ref 3.5–5.1)
Sodium: 142 mEq/L (ref 135–145)
TCO2: 29 mmol/L (ref 0–100)

## 2011-05-21 LAB — POCT URINALYSIS DIP (DEVICE)
Glucose, UA: NEGATIVE mg/dL
Ketones, ur: NEGATIVE mg/dL
Leukocytes, UA: NEGATIVE
Specific Gravity, Urine: 1.015 (ref 1.005–1.030)
Urobilinogen, UA: 0.2 mg/dL (ref 0.0–1.0)

## 2011-05-21 LAB — RAPID URINE DRUG SCREEN, HOSP PERFORMED
Amphetamines: NOT DETECTED
Opiates: NOT DETECTED

## 2011-05-21 MED ORDER — BENZONATATE 100 MG PO CAPS: 100.0000 mg | ORAL_CAPSULE | Freq: Three times a day (TID) | ORAL | Status: AC

## 2011-05-21 NOTE — ED Notes (Signed)
Pt  Reports  For  The  Last  4  Days  She  Has  Had   Symptoms  Of  Cough/ congestion  With   Body  achea  And  Malaise         She  Also  Reports  Frequent  Urination as  Well       The  Pt  Reports  Pain in her  Chest  Associated  With  The  Coughing  Spells  She  Is  Awake  As  Well as   Alert  Oriented

## 2011-05-21 NOTE — Discharge Instructions (Signed)
You have been coughing for about 4 days with some respiratory congestion. Your exam was consistent with an upper respiratory viral infection this medicine can help you with your cough. As recommended you seem to be very sleepy and fatigue and tiredness U. been working at night have recommended that she sleep the next 24 hours and to not drive.    Antibiotic Nonuse  Your caregiver felt that the infection or problem was not one that would be helped with an antibiotic. Infections may be caused by viruses or bacteria. Only a caregiver can tell which one of these is the likely cause of an illness. A cold is the most common cause of infection in both adults and children. A cold is a virus. Antibiotic treatment will have no effect on a viral infection. Viruses can lead to many lost days of work caring for sick children and many missed days of school. Children may catch as many as 10 "colds" or "flus" per year during which they can be tearful, cranky, and uncomfortable. The goal of treating a virus is aimed at keeping the ill person comfortable. Antibiotics are medications used to help the body fight bacterial infections. There are relatively few types of bacteria that cause infections but there are hundreds of viruses. While both viruses and bacteria cause infection they are very different types of germs. A viral infection will typically go away by itself within 7 to 10 days. Bacterial infections may spread or get worse without antibiotic treatment. Examples of bacterial infections are:  Sore throats (like strep throat or tonsillitis).   Infection in the lung (pneumonia).   Ear and skin infections.  Examples of viral infections are:  Colds or flus.   Most coughs and bronchitis.   Sore throats not caused by Strep.   Runny noses.  It is often best not to take an antibiotic when a viral infection is the cause of the problem. Antibiotics can kill off the helpful bacteria that we have inside our body and  allow harmful bacteria to start growing. Antibiotics can cause side effects such as allergies, nausea, and diarrhea without helping to improve the symptoms of the viral infection. Additionally, repeated uses of antibiotics can cause bacteria inside of our body to become resistant. That resistance can be passed onto harmful bacterial. The next time you have an infection it may be harder to treat if antibiotics are used when they are not needed. Not treating with antibiotics allows our own immune system to develop and take care of infections more efficiently. Also, antibiotics will work better for [place] when they are prescribed for bacterial infections. Treatments for a child that is ill may include:  Give extra fluids throughout the day to stay hydrated.   Get plenty of rest.   Only give your child over-the-counter or prescription medicines for pain, discomfort, or fever as directed by your caregiver.   The use of a cool mist humidifier may help stuffy noses.   Cold medications if suggested by your caregiver.  Your caregiver may decide to start you on an antibiotic if:  The problem you were seen for today continues for a longer length of time than expected.   You develop a secondary bacterial infection.  SEEK MEDICAL CARE IF:  Fever lasts longer than 5 days.   Symptoms continue to get worse after 5 to 7 days or become severe.   Difficulty in breathing develops.   Signs of dehydration develop (poor drinking, rare urinating, dark colored urine).  Changes in behavior or worsening tiredness (listlessness or lethargy).  Document Released: 05/26/2001 Document Revised: 11/27/2010 Document Reviewed: 11/22/2008 [facility] Patient Information 2012 [place], [place].

## 2011-05-21 NOTE — ED Provider Notes (Signed)
History     CSN: [id number]  Arrival date & time 05/21/11  1147   First MD Initiated Contact with Patient 05/21/11 1320      Chief Complaint  Patient presents with  . Cough    (Consider location/radiation/quality/duration/timing/severity/associated sxs/prior treatment) HPI Comments: Patient presented to urgent care with a chief complaint of coughing episodes mainly trying nature been having some upper congestion especially of her nose, also some body aches and feeling a bit tired and fatigued. She describes she works third shift been feeling very tired and sleepy and she's been working long hours in early hours of the morning. Patient describes that when she's cough her chest hurts.  Patient is a 51 y.o. female presenting with cough. The history is provided by the patient.  Cough This is a new problem. The current episode started more than 2 days ago. The problem occurs every few minutes. The problem has been gradually worsening. The cough is non-productive. Associated symptoms include chest pain, rhinorrhea and myalgias. Pertinent negatives include no chills, no sweats, no weight loss, no ear congestion, no shortness of breath and no wheezing. She has tried decongestants for the symptoms. She is not a smoker.    Past Medical History  Diagnosis Date  . Diabetes mellitus   . Hypertension   . Arthritis   . Gout     Past Surgical History  Procedure Date  . Arthe     No family history on file.  History  Substance Use Topics  . Smoking status: Not on file  . Smokeless tobacco: Not on file  . Alcohol Use:     OB History    Grav Para Term Preterm Abortions TAB SAB Ect Mult Living                  Review of Systems  Constitutional: Negative for fever, chills, weight loss, diaphoresis and fatigue.  HENT: Positive for rhinorrhea.   Respiratory: Positive for cough. Negative for shortness of breath and wheezing.   Cardiovascular: Positive for chest pain.  Musculoskeletal:  Positive for myalgias.    Allergies  Oxycodone-acetaminophen and Penicillins  Home Medications   Current Outpatient Rx  Name Route Sig Dispense Refill  . ATENOLOL 25 MG PO TABS Oral Take 100 mg by mouth daily.     . BUPROPION HCL ER (SR) 150 MG PO TB12 Oral Take 150 mg by mouth 2 (two) times daily.    Marland Kitchen CLONIDINE HCL 0.1 MG PO TABS Oral Take 0.2 mg by mouth 2 (two) times daily.     Marland Kitchen DIVALPROEX SODIUM ER 500 MG PO TB24 Oral Take 500 mg by mouth daily.    . FUROSEMIDE 40 MG PO TABS Oral Take 20 mg by mouth daily.     Marland Kitchen HYDROXYZINE HCL 25 MG PO TABS Oral Take 25 mg by mouth at bedtime as needed.    Marland Kitchen LORATADINE 10 MG PO TABS Oral Take 10 mg by mouth daily.    Marland Kitchen METFORMIN HCL 500 MG PO TABS Oral Take 850 mg by mouth daily with breakfast.     . ROSUVASTATIN CALCIUM 40 MG PO TABS Oral Take 10 mg by mouth daily.     Marland Kitchen ZOLPIDEM TARTRATE 10 MG PO TABS Oral Take 10 mg by mouth at bedtime as needed.    Marland Kitchen BENZONATATE 100 MG PO CAPS Oral Take 1 capsule (100 mg total) by mouth every 8 (eight) hours. 21 capsule 0    BP 157/91  Pulse 63  Temp(Src) 98.6 F (37 C) (Oral)  Resp 18  SpO2 100%  Physical Exam  Nursing note and vitals reviewed. Constitutional: She appears well-developed and well-nourished. No distress.  HENT:  Head: Normocephalic.  Mouth/Throat: No oropharyngeal exudate.  Eyes: Conjunctivae are normal. Right eye exhibits no discharge. Left eye exhibits no discharge.  Neck: Neck supple. No JVD present.  Cardiovascular: Normal rate.   Pulmonary/Chest: Effort normal. No respiratory distress. She has no wheezes. She has no rales. She exhibits no tenderness.  Abdominal: Bowel sounds are normal. There is no tenderness. There is no rebound and no guarding.  Lymphadenopathy:    She has no cervical adenopathy.  Neurological: She is alert.  Skin: Skin is warm.    ED Course  Procedures (including critical care time)   Labs Reviewed  POCT URINALYSIS DIP (DEVICE)  POCT I-STAT, CHEM  8  URINE RAPID DRUG SCREEN (HOSP PERFORMED)   No results found.   1. Upper respiratory infection       MDM  Patient with upper respiratory symptoms which mainly include nasal congestion or stuffiness and mild dry cough. Patient suffered afebrile with a normal lung exam. Not dyspneic.        Jimmie Molly, MD 05/21/11 [phone]

## 2011-09-18 ENCOUNTER — Emergency Department (HOSPITAL_COMMUNITY)
Admission: EM | Admit: 2011-09-18 | Discharge: 2011-09-18 | Disposition: A | Discharge status: Home / Self Care | Payer: Self-pay | Source: Home / Self Care | Attending: Family Medicine | Admitting: Family Medicine

## 2011-09-18 ENCOUNTER — Encounter (HOSPITAL_COMMUNITY): Payer: Self-pay | Admitting: *Deleted

## 2011-09-18 DIAGNOSIS — S91309A Unspecified open wound, unspecified foot, initial encounter: Secondary | ICD-10-CM

## 2011-09-18 DIAGNOSIS — S91331A Puncture wound without foreign body, right foot, initial encounter: Secondary | ICD-10-CM

## 2011-09-18 MED ORDER — TETANUS-DIPHTH-ACELL PERTUSSIS 5-2.5-18.5 LF-MCG/0.5 IM SUSP
INTRAMUSCULAR | Status: AC
  Filled 2011-09-18: qty 0.5

## 2011-09-18 MED ORDER — TETANUS-DIPHTH-ACELL PERTUSSIS 5-2.5-18.5 LF-MCG/0.5 IM SUSP
0.5000 mL | Freq: Once | INTRAMUSCULAR | Status: AC
  Administered 2011-09-18: 0.5 mL via INTRAMUSCULAR

## 2011-09-18 NOTE — ED Notes (Signed)
Pt states she stepped on nail that punctured her right foot through her shoe today.  Pt states wound did not bleed.  No obvious injury.  Pt reports right great toe tingling, but says that could be from her gout or diabetes.

## 2011-09-18 NOTE — Discharge Instructions (Signed)
Return if further problems

## 2011-09-18 NOTE — ED Provider Notes (Signed)
History     CSN: [id number]  Arrival date & time 09/18/11  1706   First MD Initiated Contact with Patient 09/18/11 1715      Chief Complaint  Patient presents with  . Foot Injury    (Consider location/radiation/quality/duration/timing/severity/associated sxs/prior treatment) Patient is a 51 y.o. female presenting with foot injury. The history is provided by the patient.  Foot Injury  The incident occurred 6 to 12 hours ago. The incident occurred at work. Injury mechanism: puncture wound from nail  The pain is present in the right foot. Pertinent negatives include no inability to bear weight. She reports no foreign bodies (no bleeding.) present.    Past Medical History  Diagnosis Date  . Diabetes mellitus   . Hypertension   . Arthritis   . Gout     Past Surgical History  Procedure Date  . Arthe     History reviewed. No pertinent family history.  History  Substance Use Topics  . Smoking status: Never Smoker   . Smokeless tobacco: Not on file  . Alcohol Use: No    OB History    Grav Para Term Preterm Abortions TAB SAB Ect Mult Living                  Review of Systems  Constitutional: Negative.   Musculoskeletal: Negative.     Allergies  Oxycodone-acetaminophen and Penicillins  Home Medications   Current Outpatient Rx  Name Route Sig Dispense Refill  . ATENOLOL 25 MG PO TABS Oral Take 100 mg by mouth daily.     . BUPROPION HCL ER (SR) 150 MG PO TB12 Oral Take 150 mg by mouth 2 (two) times daily.    Marland Kitchen CLONIDINE HCL 0.1 MG PO TABS Oral Take 0.2 mg by mouth 2 (two) times daily.     Marland Kitchen DIVALPROEX SODIUM ER 500 MG PO TB24 Oral Take 500 mg by mouth daily.    . FUROSEMIDE 40 MG PO TABS Oral Take 20 mg by mouth daily.     Marland Kitchen HYDROXYZINE HCL 25 MG PO TABS Oral Take 25 mg by mouth at bedtime as needed.    Marland Kitchen LORATADINE 10 MG PO TABS Oral Take 10 mg by mouth daily.    Marland Kitchen METFORMIN HCL 500 MG PO TABS Oral Take 850 mg by mouth daily with breakfast.     . ROSUVASTATIN  CALCIUM 40 MG PO TABS Oral Take 10 mg by mouth daily.     Marland Kitchen ZOLPIDEM TARTRATE 10 MG PO TABS Oral Take 10 mg by mouth at bedtime as needed.      BP 127/73  Pulse 81  Temp 99.2 F (37.3 C) (Oral)  Resp 16  SpO2 96%  Physical Exam  Nursing note and vitals reviewed. Constitutional: She is oriented to person, place, and time. She appears well-developed and well-nourished.  Musculoskeletal: She exhibits edema. She exhibits no tenderness.  Neurological: She is alert and oriented to person, place, and time.  Skin: Skin is warm and dry.       No visible puncture wound on plantar surface of right foot, no bleeding, no erythema, nontender,     ED Course  Procedures (including critical care time)  Labs Reviewed - No data to display No results found.   1. Puncture wound of foot, right       MDM  Betadine soak of foot.        Linna Hoff, MD 09/18/11 [phone]

## 2012-01-26 ENCOUNTER — Encounter: Payer: Self-pay | Admitting: Internal Medicine

## 2012-01-26 ENCOUNTER — Ambulatory Visit (INDEPENDENT_AMBULATORY_CARE_PROVIDER_SITE_OTHER): Payer: Self-pay | Admitting: Internal Medicine

## 2012-01-26 VITALS — BP 160/95 | HR 50 | Temp 98.2°F | Ht 63.0 in | Wt 260.4 lb

## 2012-01-26 DIAGNOSIS — I10 Essential (primary) hypertension: Secondary | ICD-10-CM

## 2012-01-26 DIAGNOSIS — E785 Hyperlipidemia, unspecified: Secondary | ICD-10-CM

## 2012-01-26 DIAGNOSIS — F319 Bipolar disorder, unspecified: Secondary | ICD-10-CM

## 2012-01-26 DIAGNOSIS — Z0000 Encounter for general adult medical examination without abnormal findings: Secondary | ICD-10-CM

## 2012-01-26 DIAGNOSIS — E1169 Type 2 diabetes mellitus with other specified complication: Secondary | ICD-10-CM | POA: Insufficient documentation

## 2012-01-26 DIAGNOSIS — E669 Obesity, unspecified: Secondary | ICD-10-CM

## 2012-01-26 DIAGNOSIS — M069 Rheumatoid arthritis, unspecified: Secondary | ICD-10-CM

## 2012-01-26 DIAGNOSIS — N750 Cyst of Bartholin's gland: Secondary | ICD-10-CM

## 2012-01-26 DIAGNOSIS — Z23 Encounter for immunization: Secondary | ICD-10-CM

## 2012-01-26 DIAGNOSIS — Z124 Encounter for screening for malignant neoplasm of cervix: Secondary | ICD-10-CM

## 2012-01-26 MED ORDER — ROSUVASTATIN CALCIUM 10 MG PO TABS: 10.0000 mg | ORAL_TABLET | Freq: Every day | ORAL | Status: DC

## 2012-01-26 NOTE — Progress Notes (Signed)
Subjective:   Patient ID: Sabrina [name] female   DOB: 1961/01/17 51 y.o.   MRN: [id number]  HPI: [name] is a 51 y.o. woman with history of DM, HTN, ?RA, gout and HLD presents to establish herself as a patient, she was formerly followed at [facility]. She stresses that financial limitations play a large role in the difficulties in appropriately managing her medical conditions.    Her only acute complaint is a bump on her vaginal labia that has been growing and is irritating.  It sometimes itches, but she has not noticed any drainage.    Regarding her bipolar disorder, she is followed at [facility].    Past Medical History  Diagnosis Date  . Diabetes mellitus   . Hypertension   . Arthritis     [facility] indicate Rheumat[facility]  . Hyperlipidemia LDL goal < 100   . Gout   . Mixed restrictive and obstructive lung disease     [facility] chart suggests PFTs done 1/10  . Bipolar disorder     2 breakdowns - 1998, 2000 had to be hospitalized, followed at [facility]  . GERD (gastroesophageal reflux disease)    Current Outpatient Prescriptions  Medication Sig Dispense Refill  . atenolol (TENORMIN) 25 MG tablet Take 100 mg by mouth daily.       Marland Kitchen buPROPion (WELLBUTRIN SR) 150 MG 12 hr tablet Take 150 mg by mouth 2 (two) times daily.      . cloNIDine (CATAPRES) 0.1 MG tablet Take 0.2 mg by mouth 2 (two) times daily.       . divalproex (DEPAKOTE ER) 500 MG 24 hr tablet Take 500 mg by mouth daily.      . furosemide (LASIX) 40 MG tablet Take 20 mg by mouth daily.       . hydrOXYzine (ATARAX/VISTARIL) 25 MG tablet Take 25 mg by mouth at bedtime as needed.      . loratadine (CLARITIN) 10 MG tablet Take 10 mg by mouth daily.      . metFORMIN (GLUCOPHAGE) 500 MG tablet Take 850 mg by mouth daily with breakfast.       . rosuvastatin (CRESTOR) 40 MG tablet Take 10 mg by mouth daily.       Marland Kitchen zolpidem (AMBIEN) 10 MG tablet Take 10 mg by mouth at bedtime as needed.       No  family history on file. History   Social History  . Marital Status: Single    Spouse Name: N/A    Number of Children: N/A  . Years of Education: N/A   Social History Main Topics  . Smoking status: Never Smoker   . Smokeless tobacco: None  . Alcohol Use: No  . Drug Use: No  . Sexually Active: No   Other Topics Concern  . None   Social History Narrative  . None   Review of Systems: General: no fevers, chills, changes in weight, changes in appetite Skin: no rash HEENT: no blurry vision, hearing changes, sore throat Pulm: no dyspnea, coughing, wheezing CV: no chest pain, palpitations, shortness of breath Abd: no abdominal pain, nausea/vomiting, diarrhea/constipation GU: no dysuria, hematuria, polyuria Ext: no arthralgias, myalgias Neuro: no weakness, numbness, or tingling   Objective:  Physical Exam: Filed Vitals:   01/26/12 1554  BP: 160/95  Pulse: 50  Temp: 98.2 F (36.8 C)  TempSrc: Oral  Height: 5\' 3"  (1.6 m)  Weight: 260 lb 6.4 oz (118.117 kg)  SpO2: 100%  Constitutional: Vital signs reviewed.  Patient is an obese woman in no acute distress and cooperative with exam.  Mouth: no erythema or exudates, MMM Eyes: PERRL, EOMI, conjunctivae normal, No scleral icterus.   Cardiovascular: RRR, S1 normal, S2 normal, no MRG, pulses symmetric and intact bilaterally Pulmonary/Chest: CTAB, no wheezes, rales, or rhonchi Abdominal: Soft. Non-tender, non-distended, bowel sounds are normal, no masses, organomegaly, or guarding present.  Vaginal exam: 1 cm oval nodule at about 4-5 oclock (left side) without induration and unable to express any fluid.  No cervical motion tenderness.  No inguinal adenopathy. Neurological: A&O x3, Strength is normal and symmetric bilaterally, cranial nerve II-XII are grossly intact, no focal motor deficit, sensory intact to light touch bilaterally.  Skin: Warm, dry and intact. No rash, cyanosis, or clubbing.  Psychiatric: Depressed mood and  affect. speech and behavior is normal. Judgment and thought content normal. Cognition and memory are normal.   Assessment & Plan:  Case and care discussed with Dr. Criselda Peaches. Patient to return in approximately 1 month for further mgmt of BP and DM - this visit was limited as I needed to reconcile our records with [facility].

## 2012-01-26 NOTE — Assessment & Plan Note (Addendum)
She is not on a traditional regimen - clonidine 0.2 BID, atenlol 100 daily and lasix 20 daily.  [facility] note that she experienced cough with lisinopril.  I did not see that HCTZ was tried upon chart review, though possibly related to history of urinary incontinence (?urge, history of being on detrol but could not afford), but, she is also no lasix without history of heart or renal failure. I suspect lasix was start d/t complaints of b/l LE edema (for which compression stockings have been suggested in the past), but may consider d/c in setting of urinary incontinence.  Amlodipine would likely be a good choice for her, though she will have to be okay with side effect of LE edema.  I suspect clonidine was started when she began having menopausal symptoms ([facility] in [facility] suggest she was menopausal).  At one point, she was instructed to take atenolol at night d/t fatigue.    Her blood pressure is controlled, and changes will need to be made slowly, with financial constraints kept in mind. -She will return for labs, as we had to pursue a vaginal exam and did not have time to get labs today (CMET, lipids) -At follow up, after discussion with patient, consider d/c lasix and start amlodipine

## 2012-01-26 NOTE — Assessment & Plan Note (Signed)
She will return for lipid profile.  Continue crestor 10.

## 2012-01-26 NOTE — Patient Instructions (Addendum)
-  Please return later this week for labs.  -Continue your medications as you have been taking them, I have sent refills to [facility] on [address].  Please be sure to bring all of your medications with you to every visit.  Should you have any new or worsening symptoms, please be sure to call the clinic at [phone].

## 2012-01-26 NOTE — Assessment & Plan Note (Signed)
Managed at [facility].  Stable on depakote & trazodone.

## 2012-01-26 NOTE — Assessment & Plan Note (Addendum)
No recent A1c, but [facility] chart review suggsts A1c<7.  She has been taking metformin 850mg  daily.  Patient will return for A1c.  Continue mgmt with metformin 850mg  daily for now.

## 2012-01-27 ENCOUNTER — Encounter: Payer: Self-pay | Admitting: Internal Medicine

## 2012-01-27 DIAGNOSIS — Z0000 Encounter for general adult medical examination without abnormal findings: Secondary | ICD-10-CM | POA: Insufficient documentation

## 2012-01-27 DIAGNOSIS — D239 Other benign neoplasm of skin, unspecified: Secondary | ICD-10-CM | POA: Insufficient documentation

## 2012-01-27 DIAGNOSIS — M069 Rheumatoid arthritis, unspecified: Secondary | ICD-10-CM | POA: Insufficient documentation

## 2012-01-27 LAB — MICROALBUMIN / CREATININE URINE RATIO
Creatinine, Urine: 84.5 mg/dL
Microalb Creat Ratio: 5.9 mg/g (ref 0.0–30.0)

## 2012-01-27 MED ORDER — METFORMIN HCL 850 MG PO TABS: 850.0000 mg | ORAL_TABLET | Freq: Every day | ORAL | Status: DC

## 2012-01-27 NOTE — Assessment & Plan Note (Signed)
Records from [facility] suggest elevated RF, though I did not find a CCP.  Records also suggested she had been started on MTX 2.5mg  weekly with goal of 7.5weekly as well as sulindac, but not sure why this medication has fallen off, possibly d/t cost?  May consider anti CCP to confirm diagnosis if symptoms flare.

## 2012-01-27 NOTE — Assessment & Plan Note (Signed)
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  Patient is interested in drainage so we will refer to GYN.

## 2012-01-27 NOTE — Assessment & Plan Note (Signed)
Pap smear done today

## 2012-01-29 ENCOUNTER — Encounter: Payer: Self-pay | Admitting: Obstetrics & Gynecology

## 2012-02-16 ENCOUNTER — Ambulatory Visit (INDEPENDENT_AMBULATORY_CARE_PROVIDER_SITE_OTHER): Payer: Self-pay | Admitting: Obstetrics & Gynecology

## 2012-02-16 ENCOUNTER — Encounter: Payer: Self-pay | Admitting: Obstetrics & Gynecology

## 2012-02-16 VITALS — BP 183/91 | HR 50 | Temp 97.7°F | Resp 12 | Ht 64.0 in | Wt 256.3 lb

## 2012-02-16 DIAGNOSIS — Z0000 Encounter for general adult medical examination without abnormal findings: Secondary | ICD-10-CM

## 2012-02-16 DIAGNOSIS — D219 Benign neoplasm of connective and other soft tissue, unspecified: Secondary | ICD-10-CM

## 2012-02-16 DIAGNOSIS — Z01419 Encounter for gynecological examination (general) (routine) without abnormal findings: Secondary | ICD-10-CM

## 2012-02-16 LAB — POCT PREGNANCY, URINE: Preg Test, Ur: NEGATIVE

## 2012-02-16 LAB — GLUCOSE, CAPILLARY: Glucose-Capillary: 96 mg/dL (ref 70–99)

## 2012-02-16 NOTE — Progress Notes (Signed)
  Subjective:    Patient ID: Sabrina [name], female    DOB: 02-22-1961, 51 y.o.   MRN: [id number]  HPI [name] is a pleasant morbidly obese non-controlled diabetic who is seen here today with a 2 year history of a lump in her left labia majora. It is not painful, but annoying. She is also concerned that she could be pregnant. She has unprotected intercourse and has had no period for three years and has hot flashes. She is adament that she wants a pregnancy test.   Review of Systems     Objective:   Physical Exam  2-3 cm firm nodule in her left labium majus. Normal bimanual exam.      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 I have also mentioned that she may need surgical clearance for surgery (if we use anything other than just local anesthesia). She wants to take time to consider if she really wants to take on those risks.

## 2012-04-24 ENCOUNTER — Encounter (HOSPITAL_COMMUNITY): Payer: Self-pay | Admitting: Physical Medicine and Rehabilitation

## 2012-04-24 ENCOUNTER — Emergency Department (HOSPITAL_COMMUNITY)
Admission: EM | Admit: 2012-04-24 | Discharge: 2012-04-24 | Disposition: A | Discharge status: Home / Self Care | Payer: Worker's Compensation | Attending: Emergency Medicine | Admitting: Emergency Medicine

## 2012-04-24 DIAGNOSIS — Z8739 Personal history of other diseases of the musculoskeletal system and connective tissue: Secondary | ICD-10-CM | POA: Insufficient documentation

## 2012-04-24 DIAGNOSIS — Z8639 Personal history of other endocrine, nutritional and metabolic disease: Secondary | ICD-10-CM | POA: Insufficient documentation

## 2012-04-24 DIAGNOSIS — F319 Bipolar disorder, unspecified: Secondary | ICD-10-CM | POA: Insufficient documentation

## 2012-04-24 DIAGNOSIS — S39012A Strain of muscle, fascia and tendon of lower back, initial encounter: Secondary | ICD-10-CM

## 2012-04-24 DIAGNOSIS — S335XXA Sprain of ligaments of lumbar spine, initial encounter: Secondary | ICD-10-CM | POA: Insufficient documentation

## 2012-04-24 DIAGNOSIS — E785 Hyperlipidemia, unspecified: Secondary | ICD-10-CM | POA: Insufficient documentation

## 2012-04-24 DIAGNOSIS — Y92410 Unspecified street and highway as the place of occurrence of the external cause: Secondary | ICD-10-CM | POA: Insufficient documentation

## 2012-04-24 DIAGNOSIS — Y9389 Activity, other specified: Secondary | ICD-10-CM | POA: Insufficient documentation

## 2012-04-24 DIAGNOSIS — I10 Essential (primary) hypertension: Secondary | ICD-10-CM | POA: Insufficient documentation

## 2012-04-24 DIAGNOSIS — Z8709 Personal history of other diseases of the respiratory system: Secondary | ICD-10-CM | POA: Insufficient documentation

## 2012-04-24 DIAGNOSIS — Z862 Personal history of diseases of the blood and blood-forming organs and certain disorders involving the immune mechanism: Secondary | ICD-10-CM | POA: Insufficient documentation

## 2012-04-24 DIAGNOSIS — E119 Type 2 diabetes mellitus without complications: Secondary | ICD-10-CM | POA: Insufficient documentation

## 2012-04-24 DIAGNOSIS — Z79899 Other long term (current) drug therapy: Secondary | ICD-10-CM | POA: Insufficient documentation

## 2012-04-24 DIAGNOSIS — K219 Gastro-esophageal reflux disease without esophagitis: Secondary | ICD-10-CM | POA: Insufficient documentation

## 2012-04-24 MED ORDER — IBUPROFEN 400 MG PO TABS
600.0000 mg | ORAL_TABLET | Freq: Once | ORAL | Status: AC
  Administered 2012-04-24: 600 mg via ORAL
  Filled 2012-04-24: qty 2

## 2012-04-24 MED ORDER — METHOCARBAMOL 500 MG PO TABS: 500.0000 mg | ORAL_TABLET | Freq: Two times a day (BID) | ORAL | Status: DC

## 2012-04-24 MED ORDER — IBUPROFEN 600 MG PO TABS: 600.0000 mg | ORAL_TABLET | Freq: Four times a day (QID) | ORAL | Status: DC | PRN
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
## 2012-04-24 NOTE — ED Notes (Signed)
Pt educated on Robaxin and not to drive on medication.
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
## 2012-05-19 ENCOUNTER — Encounter: Payer: Self-pay | Admitting: Internal Medicine

## 2012-06-23 ENCOUNTER — Ambulatory Visit (INDEPENDENT_AMBULATORY_CARE_PROVIDER_SITE_OTHER): Payer: Self-pay | Admitting: Internal Medicine

## 2012-06-23 ENCOUNTER — Encounter: Payer: Self-pay | Admitting: Internal Medicine

## 2012-06-23 VITALS — BP 130/84 | HR 65 | Temp 98.1°F | Wt 261.9 lb

## 2012-06-23 DIAGNOSIS — E119 Type 2 diabetes mellitus without complications: Secondary | ICD-10-CM

## 2012-06-23 DIAGNOSIS — E785 Hyperlipidemia, unspecified: Secondary | ICD-10-CM

## 2012-06-23 DIAGNOSIS — E1169 Type 2 diabetes mellitus with other specified complication: Secondary | ICD-10-CM

## 2012-06-23 DIAGNOSIS — R5383 Other fatigue: Secondary | ICD-10-CM

## 2012-06-23 DIAGNOSIS — R5381 Other malaise: Secondary | ICD-10-CM

## 2012-06-23 DIAGNOSIS — D219 Benign neoplasm of connective and other soft tissue, unspecified: Secondary | ICD-10-CM

## 2012-06-23 DIAGNOSIS — E669 Obesity, unspecified: Secondary | ICD-10-CM

## 2012-06-23 DIAGNOSIS — I10 Essential (primary) hypertension: Secondary | ICD-10-CM

## 2012-06-23 DIAGNOSIS — Z0000 Encounter for general adult medical examination without abnormal findings: Secondary | ICD-10-CM

## 2012-06-23 DIAGNOSIS — M069 Rheumatoid arthritis, unspecified: Secondary | ICD-10-CM

## 2012-06-23 DIAGNOSIS — D239 Other benign neoplasm of skin, unspecified: Secondary | ICD-10-CM

## 2012-06-23 LAB — POCT GLYCOSYLATED HEMOGLOBIN (HGB A1C): Hemoglobin A1C: 5.7

## 2012-06-23 NOTE — Patient Instructions (Addendum)
-  Continue taking your medications as you have been  -Please get your orange card in order so we can work on your health maintenance   -Please make an appointment with the gynecologist for removal of the bump on your vagina --> [phone]  Please be sure to bring all of your medications with you to every visit.  Should you have any new or worsening symptoms, please be sure to call the clinic at [phone].
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
## 2012-06-24 LAB — COMPREHENSIVE METABOLIC PANEL
ALT: 16 U/L (ref 0–35)
AST: 19 U/L (ref 0–37)
Albumin: 3.7 g/dL (ref 3.5–5.2)
Alkaline Phosphatase: 62 U/L (ref 39–117)
Calcium: 9 mg/dL (ref 8.4–10.5)
Chloride: 105 mEq/L (ref 96–112)
Potassium: 3.9 mEq/L (ref 3.5–5.3)
Sodium: 140 mEq/L (ref 135–145)
Total Protein: 7.2 g/dL (ref 6.0–8.3)

## 2012-06-24 LAB — LIPID PANEL
Cholesterol: 185 mg/dL (ref 0–200)
LDL Cholesterol: 54 mg/dL (ref 0–99)
VLDL: 59 mg/dL — ABNORMAL HIGH (ref 0–40)

## 2012-06-24 NOTE — Assessment & Plan Note (Signed)
Lab Results  Component Value Date   HGBA1C 5.7 06/23/2012   HGBA1C 6.7* 05/08/2009     Assessment:  Diabetes control:  controlled  Progress toward A1C goal:   improved  Plan:  Medications:  continue current medications - may consider discontinuing at next visit if controlled.  Home glucose monitoring: only as needed   Instruction/counseling given: reminded to bring blood glucose meter & log to each visit, reminded to bring medications to each visit and discussed the need for weight loss  Educational resources provided: brochure

## 2012-06-24 NOTE — Assessment & Plan Note (Signed)
BP Readings from Last 3 Encounters:  06/23/12 130/84  04/24/12 173/80  02/16/12 183/91    Lab Results  Component Value Date   NA 140 06/23/2012   K 3.9 06/23/2012   CREATININE 0.87 06/23/2012    Assessment:  Blood pressure control:  controlled  Progress toward BP goal:   at goal  Comments: Please see discussion on 01/27/12 in regards to patient's BP regimen.  Plan:  Medications:  continue current medications - currently well controlled on clonidine 0.2 BID, atenolol 100 daily and lasix 20 daily.  Given her financial stressors, and she seems to be well controlled, I will not make changes now, but may consider d/c lasix and adding amlodipine in the future.  Educational resources provided: brochure

## 2012-06-24 NOTE — Assessment & Plan Note (Signed)
Per pt, Colonoscopy deferred due to cost at this time.

## 2012-06-24 NOTE — Assessment & Plan Note (Signed)
Has been seen by Gyn in 01/2012.  She would like to pursue removal since that visit (deferred at that time due to risks).  I asked that she schedule at an appt with GYN.

## 2012-06-24 NOTE — Assessment & Plan Note (Signed)
Patient's vague symptoms may be related to RA, but no obvious progressive disease. She may be at risk for other autoimmune disease, so I checked TSH which was wnl.  Symptoms may also be related to depression, but she is followed at mental health for that.  Currently, she is under quite a bit of financial constraint, and does not want to continue to be billed. We will defer further work up until she gets the orange card.

## 2012-09-08 ENCOUNTER — Encounter: Payer: Self-pay | Admitting: Dietician

## 2012-10-07 ENCOUNTER — Other Ambulatory Visit: Payer: Self-pay

## 2012-10-22 ENCOUNTER — Encounter (HOSPITAL_COMMUNITY): Payer: Self-pay | Admitting: Emergency Medicine

## 2012-10-22 ENCOUNTER — Emergency Department (HOSPITAL_COMMUNITY)
Admission: EM | Admit: 2012-10-22 | Discharge: 2012-10-22 | Disposition: A | Discharge status: Home / Self Care | Payer: Self-pay | Attending: Emergency Medicine | Admitting: Emergency Medicine

## 2012-10-22 ENCOUNTER — Emergency Department (HOSPITAL_COMMUNITY): Payer: Self-pay

## 2012-10-22 DIAGNOSIS — E785 Hyperlipidemia, unspecified: Secondary | ICD-10-CM | POA: Insufficient documentation

## 2012-10-22 DIAGNOSIS — J3489 Other specified disorders of nose and nasal sinuses: Secondary | ICD-10-CM | POA: Insufficient documentation

## 2012-10-22 DIAGNOSIS — M129 Arthropathy, unspecified: Secondary | ICD-10-CM | POA: Insufficient documentation

## 2012-10-22 DIAGNOSIS — M109 Gout, unspecified: Secondary | ICD-10-CM | POA: Insufficient documentation

## 2012-10-22 DIAGNOSIS — E119 Type 2 diabetes mellitus without complications: Secondary | ICD-10-CM | POA: Insufficient documentation

## 2012-10-22 DIAGNOSIS — F319 Bipolar disorder, unspecified: Secondary | ICD-10-CM | POA: Insufficient documentation

## 2012-10-22 DIAGNOSIS — I10 Essential (primary) hypertension: Secondary | ICD-10-CM | POA: Insufficient documentation

## 2012-10-22 DIAGNOSIS — E669 Obesity, unspecified: Secondary | ICD-10-CM | POA: Insufficient documentation

## 2012-10-22 DIAGNOSIS — J984 Other disorders of lung: Secondary | ICD-10-CM | POA: Insufficient documentation

## 2012-10-22 DIAGNOSIS — J4489 Other specified chronic obstructive pulmonary disease: Secondary | ICD-10-CM | POA: Insufficient documentation

## 2012-10-22 DIAGNOSIS — Z880 Allergy status to penicillin: Secondary | ICD-10-CM | POA: Insufficient documentation

## 2012-10-22 DIAGNOSIS — J069 Acute upper respiratory infection, unspecified: Secondary | ICD-10-CM

## 2012-10-22 DIAGNOSIS — Z79899 Other long term (current) drug therapy: Secondary | ICD-10-CM | POA: Insufficient documentation

## 2012-10-22 DIAGNOSIS — J449 Chronic obstructive pulmonary disease, unspecified: Secondary | ICD-10-CM | POA: Insufficient documentation

## 2012-10-22 DIAGNOSIS — K219 Gastro-esophageal reflux disease without esophagitis: Secondary | ICD-10-CM | POA: Insufficient documentation

## 2012-10-22 MED ORDER — BENZONATATE 100 MG PO CAPS: 200.0000 mg | ORAL_CAPSULE | Freq: Two times a day (BID) | ORAL | Status: DC | PRN

## 2012-10-22 NOTE — ED Notes (Signed)
Patient states she is homeless and has lost her voice x a few days.   Patient states she needs an antibiotic.   Patient asked for something to drink.  I advised nothing until PA comes and sees her.

## 2012-10-22 NOTE — ED Notes (Signed)
Is homeless and has lost voice and has a cough  1- 2  Weeks place where she is staying has this URI going around
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
## 2012-10-26 NOTE — ED Provider Notes (Signed)
Medical screening examination/treatment/procedure(s) were performed by non-physician practitioner and as supervising physician I was immediately available for consultation/collaboration.   Ranita Stjulien Ann Claudy Abdallah, MD 10/26/12 0713 

## 2012-12-30 ENCOUNTER — Encounter (HOSPITAL_BASED_OUTPATIENT_CLINIC_OR_DEPARTMENT_OTHER): Payer: Self-pay | Admitting: *Deleted

## 2012-12-30 ENCOUNTER — Emergency Department (HOSPITAL_BASED_OUTPATIENT_CLINIC_OR_DEPARTMENT_OTHER)
Admission: EM | Admit: 2012-12-30 | Discharge: 2012-12-31 | Disposition: A | Discharge status: Home / Self Care | Payer: Self-pay | Attending: Emergency Medicine | Admitting: Emergency Medicine

## 2012-12-30 DIAGNOSIS — E119 Type 2 diabetes mellitus without complications: Secondary | ICD-10-CM | POA: Insufficient documentation

## 2012-12-30 DIAGNOSIS — R251 Tremor, unspecified: Secondary | ICD-10-CM

## 2012-12-30 DIAGNOSIS — I10 Essential (primary) hypertension: Secondary | ICD-10-CM | POA: Insufficient documentation

## 2012-12-30 DIAGNOSIS — R259 Unspecified abnormal involuntary movements: Secondary | ICD-10-CM | POA: Insufficient documentation

## 2012-12-30 DIAGNOSIS — K219 Gastro-esophageal reflux disease without esophagitis: Secondary | ICD-10-CM | POA: Insufficient documentation

## 2012-12-30 DIAGNOSIS — Z8739 Personal history of other diseases of the musculoskeletal system and connective tissue: Secondary | ICD-10-CM | POA: Insufficient documentation

## 2012-12-30 DIAGNOSIS — J3489 Other specified disorders of nose and nasal sinuses: Secondary | ICD-10-CM | POA: Insufficient documentation

## 2012-12-30 DIAGNOSIS — Z862 Personal history of diseases of the blood and blood-forming organs and certain disorders involving the immune mechanism: Secondary | ICD-10-CM | POA: Insufficient documentation

## 2012-12-30 DIAGNOSIS — Z8639 Personal history of other endocrine, nutritional and metabolic disease: Secondary | ICD-10-CM | POA: Insufficient documentation

## 2012-12-30 DIAGNOSIS — Z880 Allergy status to penicillin: Secondary | ICD-10-CM | POA: Insufficient documentation

## 2012-12-30 DIAGNOSIS — R51 Headache: Secondary | ICD-10-CM | POA: Insufficient documentation

## 2012-12-30 DIAGNOSIS — Z8709 Personal history of other diseases of the respiratory system: Secondary | ICD-10-CM | POA: Insufficient documentation

## 2012-12-30 DIAGNOSIS — Z79899 Other long term (current) drug therapy: Secondary | ICD-10-CM | POA: Insufficient documentation

## 2012-12-30 DIAGNOSIS — Z8659 Personal history of other mental and behavioral disorders: Secondary | ICD-10-CM | POA: Insufficient documentation

## 2012-12-30 DIAGNOSIS — R519 Headache, unspecified: Secondary | ICD-10-CM

## 2012-12-30 DIAGNOSIS — R509 Fever, unspecified: Secondary | ICD-10-CM | POA: Insufficient documentation

## 2012-12-30 LAB — GLUCOSE, CAPILLARY: Glucose-Capillary: 86 mg/dL (ref 70–99)

## 2012-12-30 LAB — URINALYSIS, ROUTINE W REFLEX MICROSCOPIC
Bilirubin Urine: NEGATIVE
Hgb urine dipstick: NEGATIVE
Ketones, ur: NEGATIVE mg/dL
Nitrite: NEGATIVE
Urobilinogen, UA: 0.2 mg/dL (ref 0.0–1.0)

## 2012-12-30 MED ORDER — KETOROLAC TROMETHAMINE 60 MG/2ML IM SOLN
60.0000 mg | Freq: Once | INTRAMUSCULAR | Status: AC
  Administered 2012-12-31: 60 mg via INTRAMUSCULAR
  Filled 2012-12-30: qty 2

## 2012-12-30 MED ORDER — METOCLOPRAMIDE HCL 10 MG PO TABS
10.0000 mg | ORAL_TABLET | Freq: Once | ORAL | Status: AC
  Administered 2012-12-31: 10 mg via ORAL
  Filled 2012-12-30: qty 1

## 2012-12-30 NOTE — ED Notes (Signed)
Attempted to obtain labs x 2 by this RN and by second RN, both unsucessful, order obtained for arterial stick by RT
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
## 2012-12-30 NOTE — ED Notes (Addendum)
Pt c/o uncontrolled tremors bil arms and legs x 6 hrs ago pt also c/o h/a

## 2012-12-30 NOTE — ED Notes (Signed)
Pt reports that she had chinese food earlier in day, went to work and started to develop these tremors, pt states that she is having a lot of stress at work, she is having a hard time catching on to work, currently is homeless and living in homeless shelter

## 2012-12-31 MED ORDER — MOMETASONE FUROATE 50 MCG/ACT NA SUSP: 2.0000 | Freq: Every day | NASAL | Status: DC

## 2012-12-31 MED ORDER — OXYMETAZOLINE HCL 0.05 % NA SOLN: 2.0000 | Freq: Two times a day (BID) | NASAL | Status: DC

## 2012-12-31 NOTE — ED Notes (Signed)
Pt provided with meal prior to discharge.

## 2013-02-02 ENCOUNTER — Encounter (HOSPITAL_COMMUNITY): Payer: Self-pay | Admitting: Emergency Medicine

## 2013-02-02 ENCOUNTER — Emergency Department (HOSPITAL_COMMUNITY)
Admission: EM | Admit: 2013-02-02 | Discharge: 2013-02-02 | Disposition: A | Discharge status: Home / Self Care | Payer: Self-pay | Attending: Emergency Medicine | Admitting: Emergency Medicine

## 2013-02-02 DIAGNOSIS — K612 Anorectal abscess: Secondary | ICD-10-CM | POA: Insufficient documentation

## 2013-02-02 DIAGNOSIS — M129 Arthropathy, unspecified: Secondary | ICD-10-CM | POA: Insufficient documentation

## 2013-02-02 DIAGNOSIS — L0291 Cutaneous abscess, unspecified: Secondary | ICD-10-CM

## 2013-02-02 DIAGNOSIS — J4489 Other specified chronic obstructive pulmonary disease: Secondary | ICD-10-CM | POA: Insufficient documentation

## 2013-02-02 DIAGNOSIS — I10 Essential (primary) hypertension: Secondary | ICD-10-CM | POA: Insufficient documentation

## 2013-02-02 DIAGNOSIS — Z79899 Other long term (current) drug therapy: Secondary | ICD-10-CM | POA: Insufficient documentation

## 2013-02-02 DIAGNOSIS — F319 Bipolar disorder, unspecified: Secondary | ICD-10-CM | POA: Insufficient documentation

## 2013-02-02 DIAGNOSIS — Z880 Allergy status to penicillin: Secondary | ICD-10-CM | POA: Insufficient documentation

## 2013-02-02 DIAGNOSIS — E119 Type 2 diabetes mellitus without complications: Secondary | ICD-10-CM | POA: Insufficient documentation

## 2013-02-02 DIAGNOSIS — IMO0002 Reserved for concepts with insufficient information to code with codable children: Secondary | ICD-10-CM | POA: Insufficient documentation

## 2013-02-02 DIAGNOSIS — Z6841 Body Mass Index (BMI) 40.0 and over, adult: Secondary | ICD-10-CM | POA: Insufficient documentation

## 2013-02-02 DIAGNOSIS — J449 Chronic obstructive pulmonary disease, unspecified: Secondary | ICD-10-CM | POA: Insufficient documentation

## 2013-02-02 DIAGNOSIS — J984 Other disorders of lung: Secondary | ICD-10-CM | POA: Insufficient documentation

## 2013-02-02 DIAGNOSIS — K219 Gastro-esophageal reflux disease without esophagitis: Secondary | ICD-10-CM | POA: Insufficient documentation

## 2013-02-02 DIAGNOSIS — K625 Hemorrhage of anus and rectum: Secondary | ICD-10-CM | POA: Insufficient documentation

## 2013-02-02 LAB — COMPREHENSIVE METABOLIC PANEL
AST: 29 U/L (ref 0–37)
Albumin: 3.2 g/dL — ABNORMAL LOW (ref 3.5–5.2)
Alkaline Phosphatase: 57 U/L (ref 39–117)
BUN: 12 mg/dL (ref 6–23)
CO2: 24 mEq/L (ref 19–32)
Chloride: 101 mEq/L (ref 96–112)
Creatinine, Ser: 0.76 mg/dL (ref 0.50–1.10)
GFR calc non Af Amer: >90 mL/min (ref >90)
Potassium: 3.9 mEq/L (ref 3.5–5.1)
Total Bilirubin: 0.1 mg/dL — ABNORMAL LOW (ref 0.3–1.2)

## 2013-02-02 LAB — CBC WITH DIFFERENTIAL/PLATELET
Basophils Absolute: 0 10*3/uL (ref 0.0–0.1)
HCT: 37.6 % (ref 36.0–46.0)
Hemoglobin: 13.2 g/dL (ref 12.0–15.0)
Lymphocytes Relative: 46 % (ref 12–46)
Lymphs Abs: 4.3 10*3/uL — ABNORMAL HIGH (ref 0.7–4.0)
Monocytes Absolute: 0.9 10*3/uL (ref 0.1–1.0)
Monocytes Relative: 10 % (ref 3–12)
Neutro Abs: 4 10*3/uL (ref 1.7–7.7)
Neutrophils Relative %: 43 % (ref 43–77)
WBC: 9.4 10*3/uL (ref 4.0–10.5)

## 2013-02-02 LAB — URINALYSIS, ROUTINE W REFLEX MICROSCOPIC
Bilirubin Urine: NEGATIVE
Glucose, UA: NEGATIVE mg/dL
Hgb urine dipstick: NEGATIVE
Ketones, ur: NEGATIVE mg/dL
Protein, ur: NEGATIVE mg/dL
Urobilinogen, UA: 0.2 mg/dL (ref 0.0–1.0)
pH: 6.5 (ref 5.0–8.0)

## 2013-02-02 LAB — URINE MICROSCOPIC-ADD ON
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
## 2013-02-02 NOTE — ED Notes (Signed)
The pt has had bright red  Rectal bleeding for 4 days.  She just saw her regular doctor in the office earlier today but they did not treat her.  She does not know if she has hemorrhoida

## 2013-02-28 ENCOUNTER — Emergency Department (HOSPITAL_COMMUNITY): Payer: Self-pay

## 2013-02-28 ENCOUNTER — Encounter (HOSPITAL_COMMUNITY): Payer: Self-pay | Admitting: Emergency Medicine

## 2013-02-28 ENCOUNTER — Emergency Department (HOSPITAL_COMMUNITY)
Admission: EM | Admit: 2013-02-28 | Discharge: 2013-02-28 | Disposition: A | Discharge status: Home / Self Care | Payer: Self-pay | Attending: Emergency Medicine | Admitting: Emergency Medicine

## 2013-02-28 DIAGNOSIS — K5289 Other specified noninfective gastroenteritis and colitis: Secondary | ICD-10-CM | POA: Insufficient documentation

## 2013-02-28 DIAGNOSIS — M129 Arthropathy, unspecified: Secondary | ICD-10-CM | POA: Insufficient documentation

## 2013-02-28 DIAGNOSIS — K219 Gastro-esophageal reflux disease without esophagitis: Secondary | ICD-10-CM | POA: Insufficient documentation

## 2013-02-28 DIAGNOSIS — F319 Bipolar disorder, unspecified: Secondary | ICD-10-CM | POA: Insufficient documentation

## 2013-02-28 DIAGNOSIS — Z8709 Personal history of other diseases of the respiratory system: Secondary | ICD-10-CM | POA: Insufficient documentation

## 2013-02-28 DIAGNOSIS — Z880 Allergy status to penicillin: Secondary | ICD-10-CM | POA: Insufficient documentation

## 2013-02-28 DIAGNOSIS — E119 Type 2 diabetes mellitus without complications: Secondary | ICD-10-CM | POA: Insufficient documentation

## 2013-02-28 DIAGNOSIS — K529 Noninfective gastroenteritis and colitis, unspecified: Secondary | ICD-10-CM

## 2013-02-28 DIAGNOSIS — R51 Headache: Secondary | ICD-10-CM | POA: Insufficient documentation

## 2013-02-28 DIAGNOSIS — I10 Essential (primary) hypertension: Secondary | ICD-10-CM | POA: Insufficient documentation

## 2013-02-28 DIAGNOSIS — Z79899 Other long term (current) drug therapy: Secondary | ICD-10-CM | POA: Insufficient documentation

## 2013-02-28 DIAGNOSIS — R609 Edema, unspecified: Secondary | ICD-10-CM | POA: Insufficient documentation

## 2013-02-28 DIAGNOSIS — R519 Headache, unspecified: Secondary | ICD-10-CM

## 2013-02-28 DIAGNOSIS — E669 Obesity, unspecified: Secondary | ICD-10-CM | POA: Insufficient documentation

## 2013-02-28 LAB — URINALYSIS, ROUTINE W REFLEX MICROSCOPIC
Bilirubin Urine: NEGATIVE
Hgb urine dipstick: NEGATIVE
Ketones, ur: NEGATIVE mg/dL
Leukocytes, UA: NEGATIVE
Nitrite: NEGATIVE
Specific Gravity, Urine: 1.018 (ref 1.005–1.030)
Urobilinogen, UA: 0.2 mg/dL (ref 0.0–1.0)
pH: 7.5 (ref 5.0–8.0)

## 2013-02-28 LAB — GLUCOSE, CAPILLARY: Glucose-Capillary: 119 mg/dL — ABNORMAL HIGH (ref 70–99)

## 2013-02-28 MED ORDER — MORPHINE SULFATE 4 MG/ML IJ SOLN
4.0000 mg | Freq: Once | INTRAMUSCULAR | Status: AC
  Administered 2013-02-28: 4 mg via INTRAVENOUS
  Filled 2013-02-28: qty 1

## 2013-02-28 MED ORDER — PROMETHAZINE HCL 25 MG PO TABS: 25.0000 mg | ORAL_TABLET | Freq: Four times a day (QID) | ORAL | Status: DC | PRN

## 2013-02-28 MED ORDER — CLONIDINE HCL 0.1 MG PO TABS
0.2000 mg | ORAL_TABLET | Freq: Once | ORAL | Status: AC
  Administered 2013-02-28: 0.2 mg via ORAL
  Filled 2013-02-28: qty 2

## 2013-02-28 MED ORDER — SODIUM CHLORIDE 0.9 % IV BOLUS (SEPSIS)
1000.0000 mL | Freq: Once | INTRAVENOUS | Status: AC
  Administered 2013-02-28: 1000 mL via INTRAVENOUS

## 2013-02-28 MED ORDER — METOCLOPRAMIDE HCL 5 MG/ML IJ SOLN
10.0000 mg | Freq: Once | INTRAMUSCULAR | Status: AC
  Administered 2013-02-28: 10 mg via INTRAVENOUS
  Filled 2013-02-28: qty 2

## 2013-02-28 MED ORDER — PANTOPRAZOLE SODIUM 40 MG IV SOLR
40.0000 mg | Freq: Once | INTRAVENOUS | Status: AC
  Administered 2013-02-28: 40 mg via INTRAVENOUS
  Filled 2013-02-28: qty 40

## 2013-02-28 MED ORDER — ONDANSETRON HCL 4 MG/2ML IJ SOLN
4.0000 mg | Freq: Once | INTRAMUSCULAR | Status: DC
  Filled 2013-02-28: qty 2

## 2013-02-28 MED ORDER — HYDROCODONE-ACETAMINOPHEN 5-325 MG PO TABS: 1.0000 | ORAL_TABLET | ORAL | Status: DC | PRN

## 2013-02-28 MED ORDER — ONDANSETRON 8 MG PO TBDP
8.0000 mg | ORAL_TABLET | Freq: Once | ORAL | Status: AC
  Administered 2013-02-28: 8 mg via ORAL
  Filled 2013-02-28: qty 1

## 2013-02-28 NOTE — ED Provider Notes (Signed)
Medical screening examination/treatment/procedure(s) were performed by non-physician practitioner and as supervising physician I was immediately available for consultation/collaboration.  EKG Interpretation   None         Gavin Pound. Oletta Lamas, MD 02/28/13 [phone]
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
## 2013-02-28 NOTE — ED Notes (Signed)
Assisted pt to bathroom-pt states large amount loose stool-actively vomiting yellow colored fluid-PA at bedside

## 2013-02-28 NOTE — ED Notes (Signed)
Pt reports she had leftovers sitting in her car for several hours today before eating them, pt has been nauseated and vomited x6, pt denies diarrhea. Reports headache and abdominal pain. Pt also states she took her medication without food tonight.

## 2013-02-28 NOTE — ED Notes (Signed)
Bed: YN82 Expected date:  Expected time:  Means of arrival:  Comments: EMS/52 yo female with N/V

## 2013-04-20 ENCOUNTER — Other Ambulatory Visit: Payer: Self-pay | Admitting: Internal Medicine

## 2013-04-20 ENCOUNTER — Encounter: Payer: Self-pay | Admitting: Dietician

## 2013-04-20 ENCOUNTER — Ambulatory Visit (INDEPENDENT_AMBULATORY_CARE_PROVIDER_SITE_OTHER): Payer: No Typology Code available for payment source | Admitting: Dietician

## 2013-04-20 ENCOUNTER — Ambulatory Visit: Payer: Self-pay | Admitting: Internal Medicine

## 2013-04-20 DIAGNOSIS — E1169 Type 2 diabetes mellitus with other specified complication: Secondary | ICD-10-CM

## 2013-04-20 DIAGNOSIS — E119 Type 2 diabetes mellitus without complications: Secondary | ICD-10-CM

## 2013-04-20 DIAGNOSIS — E669 Obesity, unspecified: Secondary | ICD-10-CM

## 2013-04-20 LAB — GLUCOSE, CAPILLARY: Glucose-Capillary: 118 mg/dL — ABNORMAL HIGH (ref 70–99)

## 2013-04-20 LAB — POCT GLYCOSYLATED HEMOGLOBIN (HGB A1C): HEMOGLOBIN A1C: 5.6
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
## 2013-04-21 LAB — MICROALBUMIN / CREATININE URINE RATIO
CREATININE, URINE: 61.4 mg/dL
MICROALB UR: 0.5 mg/dL (ref 0.00–1.89)
Microalb Creat Ratio: 8.1 mg/g (ref 0.0–30.0)

## 2013-04-22 ENCOUNTER — Encounter: Payer: Self-pay | Admitting: Internal Medicine

## 2013-04-22 ENCOUNTER — Ambulatory Visit (INDEPENDENT_AMBULATORY_CARE_PROVIDER_SITE_OTHER): Payer: No Typology Code available for payment source | Admitting: Internal Medicine

## 2013-04-22 ENCOUNTER — Encounter (INDEPENDENT_AMBULATORY_CARE_PROVIDER_SITE_OTHER): Payer: Self-pay

## 2013-04-22 VITALS — BP 169/96 | HR 64 | Temp 97.5°F | Ht 64.0 in | Wt 263.8 lb

## 2013-04-22 DIAGNOSIS — IMO0001 Reserved for inherently not codable concepts without codable children: Secondary | ICD-10-CM

## 2013-04-22 DIAGNOSIS — A749 Chlamydial infection, unspecified: Secondary | ICD-10-CM

## 2013-04-22 DIAGNOSIS — D219 Benign neoplasm of connective and other soft tissue, unspecified: Secondary | ICD-10-CM

## 2013-04-22 DIAGNOSIS — E1169 Type 2 diabetes mellitus with other specified complication: Secondary | ICD-10-CM

## 2013-04-22 DIAGNOSIS — E669 Obesity, unspecified: Secondary | ICD-10-CM

## 2013-04-22 DIAGNOSIS — Z0000 Encounter for general adult medical examination without abnormal findings: Secondary | ICD-10-CM

## 2013-04-22 DIAGNOSIS — E119 Type 2 diabetes mellitus without complications: Secondary | ICD-10-CM

## 2013-04-22 DIAGNOSIS — R197 Diarrhea, unspecified: Secondary | ICD-10-CM

## 2013-04-22 DIAGNOSIS — I10 Essential (primary) hypertension: Secondary | ICD-10-CM

## 2013-04-22 DIAGNOSIS — D239 Other benign neoplasm of skin, unspecified: Secondary | ICD-10-CM

## 2013-04-22 DIAGNOSIS — Z1239 Encounter for other screening for malignant neoplasm of breast: Secondary | ICD-10-CM

## 2013-04-22 LAB — RPR

## 2013-04-22 LAB — GLUCOSE, CAPILLARY: GLUCOSE-CAPILLARY: 94 mg/dL (ref 70–99)

## 2013-04-22 LAB — HIV ANTIBODY (ROUTINE TESTING W REFLEX): HIV: NONREACTIVE
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
## 2013-04-22 NOTE — Assessment & Plan Note (Addendum)
Number for mammo scholarship given. PNA shot and pap deferred due to cost. Colon also deferred due to cost; retinal camera eye exam to be done in clinic once orange card approved - patient to meet with Marlana Latus for orange card.

## 2013-04-22 NOTE — Patient Instructions (Addendum)
Thanks for coming to see [place] today!  We covered a lot!!!!  -Regarding your diabetes, you're doing great!  Let us do a trial OFF of metformin - do not take this medication for the next 3 months. -We did a vaginal exam today, and I will check gonorrhea, chlamydia and trichomonas.  I will check your blood for syphilis and HIV.   -Today you received your pneumonia shot -We need your insurance situated so we can refer you for a colonoscopy, given your long history of diarrhea, but the good news is, your rectal tone is strong! -Please call the number given to you regarding the scholarship for mammogram - please have this done. -Regarding the bump on your vagina,  please call your gynecologist to re-discuss having it removed --> [phone]   Please be sure to bring all of your medications with you to every visit.  Should you have any new or worsening symptoms, please be sure to call the clinic at [phone].

## 2013-04-22 NOTE — Assessment & Plan Note (Addendum)
BP Readings from Last 3 Encounters:  04/22/13 169/96  02/28/13 175/90  02/02/13 159/91    Lab Results  Component Value Date   NA 137 02/02/2013   K 3.9 02/02/2013   CREATININE 0.76 02/02/2013    Assessment: Blood pressure control:  elevated Progress toward BP goal:   unchanged Comments: Not enough time to address this today - chronic issue  Plan: Medications:  continue current medications - she will continue clonidine, atenlol and furosemide for now; may consider changing furosemide to HCTZ for better ctrl (lasix on for LE edema, no h/o heart or kidney failure); eventually try to taper off clonidine ([facility] for [facility] sx) Educational resources provided: brochure;handout;video

## 2013-04-22 NOTE — Assessment & Plan Note (Signed)
Lab Results  Component Value Date   HGBA1C 5.6 04/20/2013   HGBA1C 5.7 06/23/2012   HGBA1C 6.7* 05/08/2009     Assessment: Diabetes control:  at goal Progress toward A1C goal:   unchanged Comments: doesn't check CBGs due to living situation  Plan: Medications:  trial off of metformin (has been taking 850mg  daily) Home glucose monitoring: not needed Instruction/counseling given:reminded to bring medications to each visit, discussed the need for weight loss and discussed diet Educational resources provided: brochure;handout

## 2013-04-22 NOTE — Assessment & Plan Note (Signed)
Re-refer to GYN, fibroma seems a bit larger.

## 2013-04-22 NOTE — Assessment & Plan Note (Addendum)
Patient reports chronic diarrhea (>1 year) with urge incontinence.  We will need to delve deeper into this issue, but of greatest concern is colorectal cancer - she is to meet with Marlana Latus - once orange card approved, will refer to GI for colonoscopy.  To note, strong rectal tone on exam.  Also, d/c metformin and monitor for resolution.

## 2013-04-22 NOTE — Progress Notes (Signed)
Case discussed with Dr. Sharda soon after the resident saw the patient.  We reviewed the resident's history and exam and pertinent patient test results.  I agree with the assessment, diagnosis, and plan of care documented in the resident's note. 

## 2013-04-27 DIAGNOSIS — A749 Chlamydial infection, unspecified: Secondary | ICD-10-CM | POA: Insufficient documentation

## 2013-04-27 MED ORDER — AZITHROMYCIN 500 MG PO TABS: 1000.0000 mg | ORAL_TABLET | Freq: Once | ORAL | Status: DC

## 2013-04-27 NOTE — Assessment & Plan Note (Signed)
Patient + for chlamydia, but - for gonorrhea.  Tried to call patient several times, no answer.  Phone went to voice mail, but there is no msg to indicate that is patient's phone. I called her pharmacy and confirmed her phone number. I also informed the pharmacy that I am sending in Azithro 1000mg  x 1 dose and if they get in touch with the patient, to please have her call our office.  I will also flag the front desk to try to call her.

## 2013-04-27 NOTE — Addendum Note (Signed)
Addended by: Othella Boyer on: 04/27/2013 09:55 AM   Modules accepted: Orders

## 2013-05-25 ENCOUNTER — Ambulatory Visit: Payer: Self-pay | Admitting: Internal Medicine

## 2013-05-31 ENCOUNTER — Encounter: Payer: Self-pay | Admitting: *Deleted

## 2013-07-07 ENCOUNTER — Encounter: Payer: Self-pay | Admitting: Obstetrics & Gynecology

## 2013-07-12 NOTE — Addendum Note (Signed)
Addended by: Hulan Fray on: 07/12/2013 06:16 PM   Modules accepted: Orders

## 2013-07-29 ENCOUNTER — Emergency Department (HOSPITAL_COMMUNITY)
Admission: EM | Admit: 2013-07-29 | Discharge: 2013-07-30 | Disposition: A | Discharge status: Home / Self Care | Payer: No Typology Code available for payment source | Attending: Emergency Medicine | Admitting: Emergency Medicine

## 2013-07-29 ENCOUNTER — Emergency Department (HOSPITAL_COMMUNITY): Payer: No Typology Code available for payment source

## 2013-07-29 ENCOUNTER — Encounter (HOSPITAL_COMMUNITY): Payer: Self-pay | Admitting: Emergency Medicine

## 2013-07-29 DIAGNOSIS — E119 Type 2 diabetes mellitus without complications: Secondary | ICD-10-CM | POA: Insufficient documentation

## 2013-07-29 DIAGNOSIS — R0789 Other chest pain: Secondary | ICD-10-CM | POA: Insufficient documentation

## 2013-07-29 DIAGNOSIS — E785 Hyperlipidemia, unspecified: Secondary | ICD-10-CM | POA: Insufficient documentation

## 2013-07-29 DIAGNOSIS — I10 Essential (primary) hypertension: Secondary | ICD-10-CM | POA: Insufficient documentation

## 2013-07-29 DIAGNOSIS — M109 Gout, unspecified: Secondary | ICD-10-CM | POA: Insufficient documentation

## 2013-07-29 DIAGNOSIS — F319 Bipolar disorder, unspecified: Secondary | ICD-10-CM | POA: Insufficient documentation

## 2013-07-29 DIAGNOSIS — Z880 Allergy status to penicillin: Secondary | ICD-10-CM | POA: Insufficient documentation

## 2013-07-29 DIAGNOSIS — K219 Gastro-esophageal reflux disease without esophagitis: Secondary | ICD-10-CM | POA: Insufficient documentation

## 2013-07-29 DIAGNOSIS — M129 Arthropathy, unspecified: Secondary | ICD-10-CM | POA: Insufficient documentation

## 2013-07-29 DIAGNOSIS — J984 Other disorders of lung: Secondary | ICD-10-CM | POA: Insufficient documentation

## 2013-07-29 DIAGNOSIS — Z6841 Body Mass Index (BMI) 40.0 and over, adult: Secondary | ICD-10-CM | POA: Insufficient documentation

## 2013-07-29 LAB — BASIC METABOLIC PANEL
BUN: 14 mg/dL (ref 6–23)
CO2: 25 mEq/L (ref 19–32)
CREATININE: 0.64 mg/dL (ref 0.50–1.10)
Calcium: 9.1 mg/dL (ref 8.4–10.5)
Chloride: 103 mEq/L (ref 96–112)
GFR calc Af Amer: >90 mL/min (ref >90)
GLUCOSE: 103 mg/dL — AB (ref 70–99)
POTASSIUM: 3.7 meq/L (ref 3.7–5.3)
Sodium: 141 mEq/L (ref 137–147)

## 2013-07-29 LAB — CBC
HEMATOCRIT: 36.2 % (ref 36.0–46.0)
HEMOGLOBIN: 12.6 g/dL (ref 12.0–15.0)
MCH: 32.2 pg (ref 26.0–34.0)
MCHC: 34.8 g/dL (ref 30.0–36.0)
MCV: 92.6 fL (ref 78.0–100.0)
Platelets: 307 10*3/uL (ref 150–400)
RBC: 3.91 MIL/uL (ref 3.87–5.11)
RDW: 13.9 % (ref 11.5–15.5)
WBC: 9.5 10*3/uL (ref 4.0–10.5)

## 2013-07-29 LAB — I-STAT TROPONIN, ED: Troponin i, poc: 0 ng/mL (ref 0.00–0.08)

## 2013-07-29 NOTE — ED Notes (Signed)
Dr. Lita Mains at Bedside.
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
## 2013-07-29 NOTE — ED Notes (Signed)
XR ar Bedside

## 2013-07-29 NOTE — ED Notes (Signed)
Pt has been living in her car and in the homeless shelter for a year trying very hard at her job so that she can get a place of her own, she states that she has been very stressed
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
## 2013-07-29 NOTE — ED Notes (Signed)
Patient is requesting a [place] sandwhich and drink.

## 2013-07-30 LAB — TROPONIN I

## 2013-07-30 LAB — PRO B NATRIURETIC PEPTIDE: Pro B Natriuretic peptide (BNP): 110.4 pg/mL (ref 0–125)

## 2013-07-30 MED ORDER — ACETAMINOPHEN 325 MG PO TABS
650.0000 mg | ORAL_TABLET | Freq: Once | ORAL | Status: AC
  Administered 2013-07-30: 650 mg via ORAL
  Filled 2013-07-30: qty 2

## 2013-07-30 NOTE — ED Notes (Signed)
Pt. Took shower. Appreciative.

## 2013-07-30 NOTE — Discharge Instructions (Signed)

## 2013-08-19 ENCOUNTER — Encounter: Payer: Self-pay | Admitting: Internal Medicine

## 2013-08-19 ENCOUNTER — Ambulatory Visit (INDEPENDENT_AMBULATORY_CARE_PROVIDER_SITE_OTHER): Payer: No Typology Code available for payment source | Admitting: Internal Medicine

## 2013-08-19 VITALS — BP 165/115 | HR 54 | Temp 97.4°F | Ht 64.0 in | Wt 263.2 lb

## 2013-08-19 DIAGNOSIS — E785 Hyperlipidemia, unspecified: Secondary | ICD-10-CM

## 2013-08-19 DIAGNOSIS — Z0000 Encounter for general adult medical examination without abnormal findings: Secondary | ICD-10-CM

## 2013-08-19 DIAGNOSIS — E1169 Type 2 diabetes mellitus with other specified complication: Secondary | ICD-10-CM

## 2013-08-19 DIAGNOSIS — E119 Type 2 diabetes mellitus without complications: Secondary | ICD-10-CM

## 2013-08-19 DIAGNOSIS — I10 Essential (primary) hypertension: Secondary | ICD-10-CM

## 2013-08-19 DIAGNOSIS — Z23 Encounter for immunization: Secondary | ICD-10-CM

## 2013-08-19 DIAGNOSIS — A749 Chlamydial infection, unspecified: Secondary | ICD-10-CM

## 2013-08-19 DIAGNOSIS — E669 Obesity, unspecified: Secondary | ICD-10-CM

## 2013-08-19 LAB — LIPID PANEL
CHOLESTEROL: 193 mg/dL (ref 0–200)
HDL: 89 mg/dL (ref >39)
LDL Cholesterol: 81 mg/dL (ref 0–99)
TRIGLYCERIDES: 114 mg/dL (ref <150)
Total CHOL/HDL Ratio: 2.2 Ratio
VLDL: 23 mg/dL (ref 0–40)

## 2013-08-19 LAB — GLUCOSE, CAPILLARY: Glucose-Capillary: 80 mg/dL (ref 70–99)

## 2013-08-19 LAB — POCT GLYCOSYLATED HEMOGLOBIN (HGB A1C): Hemoglobin A1C: 5.6

## 2013-08-19 MED ORDER — HYDROCHLOROTHIAZIDE 25 MG PO TABS: 25.0000 mg | ORAL_TABLET | Freq: Every day | ORAL | Status: DC

## 2013-08-19 MED ORDER — METFORMIN HCL 850 MG PO TABS: 850.0000 mg | ORAL_TABLET | Freq: Every morning | ORAL | Status: DC

## 2013-08-19 NOTE — Assessment & Plan Note (Signed)
Doesn't seem to be on crestor now - was taking in the past.  Lipid panel today.

## 2013-08-19 NOTE — Assessment & Plan Note (Signed)
BP Readings from Last 3 Encounters:  08/19/13 165/115  07/30/13 160/81  04/22/13 169/96    Lab Results  Component Value Date   NA 141 07/29/2013   K 3.7 07/29/2013   CREATININE 0.64 07/29/2013    Assessment: Blood pressure control: moderately elevated Progress toward BP goal:  unchanged Comments: Meds not taken today, likely experiencing a component of rebound HTN from missing clonidine dose  Plan: Medications:  d/c lasix, start hctz 25 daily, cont atenolol and clonidine for now with plans to change to coreg for better BP control, or ARB (lisinopril caused cough) Educational resources provided: brochure;handout;video

## 2013-08-19 NOTE — Assessment & Plan Note (Addendum)
I'm not clear if she received the treatment I sent in for chlamydia.  Vaginal exam revealed malodorous white discharge - wet prep and GC/C sent.  She confirmed her current phone number is correct.  ADDENDUM 08/25/13: patient positive for chlamydia and BV.  Needs Azithro 1000mg  x 1 dose and Flagyl 500 bid x 7days - attempted to call patient on 08/23/13 and 08/25/13 - no answer at mobile number, and she does not work at the work number listed.  Letter printed and mailed.
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
## 2013-08-19 NOTE — Progress Notes (Signed)
Case discussed with Dr. Sharda soon after the resident saw the patient.  We reviewed the resident's history and exam and pertinent patient test results.  I agree with the assessment, diagnosis, and plan of care documented in the resident's note. 

## 2013-08-19 NOTE — Assessment & Plan Note (Signed)
Lab Results  Component Value Date   HGBA1C 5.6 08/19/2013   HGBA1C 5.6 04/20/2013   HGBA1C 5.7 06/23/2012     Assessment: Diabetes control: good control (HgbA1C at goal) Progress toward A1C goal:  at goal Comments: we d/c metformin at last visit, but she reports she is still taking  Plan: Medications:  continue metformin 850 daily given patient's wishes.  We had d/c because I thought diarrhea may be secondary to metformin, but diarrhea seems to have improved Home glucose monitoring: Frequency: no home glucose monitoring Timing:   Instruction/counseling given: reminded to bring medications to each visit Educational resources provided: brochure;handout

## 2013-08-19 NOTE — Patient Instructions (Signed)
General Instructions: -Regarding your blood pressure, it is very elevated today.  Please discontinue lasix (also called furosemide) and start hydrochlorothiazide 25mg  daily.  When you return in 1 month, please bring your medications with you, and take your medications on day of appointment.  -I am checking your urine for gonorrhea and chlamydia, if it is positive and you need treatment, I will call you.  -Regarding your diabetes, we will continue metformin,but if you start to have worsening diarrhea, we can try stopping this medication  -Today we will give you your pneumonia shot  -Please schedule your mammogram.  Next time we will discuss colonoscopy.    Please bring your medicines with you each time you come to clinic.  Medicines may include prescription medications, over-the-counter medications, herbal remedies, eye drops, vitamins, or other pills.   Progress Toward Treatment Goals:  Treatment Goal 08/19/2013  Hemoglobin A1C at goal  Blood pressure unchanged    Self Care Goals & Plans:  Self Care Goal 08/19/2013  Manage my medications take my medicines as prescribed; bring my medications to every visit; refill my medications on time; follow the sick day instructions if I am sick  Eat healthy foods eat more vegetables; eat fruit for snacks and desserts; eat baked foods instead of fried foods; eat foods that are low in salt  Be physically active find an activity I enjoy  Meeting treatment goals maintain the current self-care plan    Home Blood Glucose Monitoring 08/19/2013  Check my blood sugar no home glucose monitoring

## 2013-08-19 NOTE — Assessment & Plan Note (Signed)
Patient to schedule mammo. Pneumovax today. Colonoscopy when patient feels she is able to schedule this.  Retinal camera broken today - retry next month.

## 2013-08-25 ENCOUNTER — Encounter: Payer: Self-pay | Admitting: Internal Medicine

## 2013-12-30 ENCOUNTER — Encounter (HOSPITAL_COMMUNITY): Payer: Self-pay | Admitting: Emergency Medicine

## 2013-12-30 ENCOUNTER — Emergency Department (HOSPITAL_COMMUNITY)
Admission: EM | Admit: 2013-12-30 | Discharge: 2013-12-30 | Disposition: A | Discharge status: Home / Self Care | Payer: Self-pay | Attending: Emergency Medicine | Admitting: Emergency Medicine

## 2013-12-30 DIAGNOSIS — R197 Diarrhea, unspecified: Secondary | ICD-10-CM | POA: Insufficient documentation

## 2013-12-30 DIAGNOSIS — I10 Essential (primary) hypertension: Secondary | ICD-10-CM | POA: Insufficient documentation

## 2013-12-30 DIAGNOSIS — M549 Dorsalgia, unspecified: Secondary | ICD-10-CM | POA: Insufficient documentation

## 2013-12-30 DIAGNOSIS — F32A Depression, unspecified: Secondary | ICD-10-CM

## 2013-12-30 DIAGNOSIS — E119 Type 2 diabetes mellitus without complications: Secondary | ICD-10-CM | POA: Insufficient documentation

## 2013-12-30 DIAGNOSIS — Z8709 Personal history of other diseases of the respiratory system: Secondary | ICD-10-CM | POA: Insufficient documentation

## 2013-12-30 DIAGNOSIS — F329 Major depressive disorder, single episode, unspecified: Secondary | ICD-10-CM | POA: Insufficient documentation

## 2013-12-30 DIAGNOSIS — R21 Rash and other nonspecific skin eruption: Secondary | ICD-10-CM | POA: Insufficient documentation

## 2013-12-30 DIAGNOSIS — K219 Gastro-esophageal reflux disease without esophagitis: Secondary | ICD-10-CM | POA: Insufficient documentation

## 2013-12-30 DIAGNOSIS — Z79899 Other long term (current) drug therapy: Secondary | ICD-10-CM | POA: Insufficient documentation

## 2013-12-30 DIAGNOSIS — Z880 Allergy status to penicillin: Secondary | ICD-10-CM | POA: Insufficient documentation

## 2013-12-30 DIAGNOSIS — R110 Nausea: Secondary | ICD-10-CM | POA: Insufficient documentation

## 2013-12-30 DIAGNOSIS — R45851 Suicidal ideations: Secondary | ICD-10-CM

## 2013-12-30 LAB — COMPREHENSIVE METABOLIC PANEL
ALT: 28 U/L (ref 0–35)
ANION GAP: 13 (ref 5–15)
AST: 35 U/L (ref 0–37)
Albumin: 3.4 g/dL — ABNORMAL LOW (ref 3.5–5.2)
Alkaline Phosphatase: 88 U/L (ref 39–117)
BUN: 9 mg/dL (ref 6–23)
CHLORIDE: 101 meq/L (ref 96–112)
CO2: 26 meq/L (ref 19–32)
CREATININE: 0.76 mg/dL (ref 0.50–1.10)
Calcium: 9.4 mg/dL (ref 8.4–10.5)
GFR calc Af Amer: >90 mL/min (ref >90)
GLUCOSE: 108 mg/dL — AB (ref 70–99)
Potassium: 4.3 mEq/L (ref 3.7–5.3)
Sodium: 140 mEq/L (ref 137–147)
Total Protein: 8.4 g/dL — ABNORMAL HIGH (ref 6.0–8.3)

## 2013-12-30 LAB — CBC
HEMATOCRIT: 36.5 % (ref 36.0–46.0)
Hemoglobin: 12.6 g/dL (ref 12.0–15.0)
MCH: 31.8 pg (ref 26.0–34.0)
MCHC: 34.5 g/dL (ref 30.0–36.0)
MCV: 92.2 fL (ref 78.0–100.0)
Platelets: 328 10*3/uL (ref 150–400)
RBC: 3.96 MIL/uL (ref 3.87–5.11)
RDW: 13.9 % (ref 11.5–15.5)
WBC: 7.6 10*3/uL (ref 4.0–10.5)

## 2013-12-30 LAB — URINALYSIS, ROUTINE W REFLEX MICROSCOPIC
Bilirubin Urine: NEGATIVE
Glucose, UA: NEGATIVE mg/dL
Hgb urine dipstick: NEGATIVE
KETONES UR: NEGATIVE mg/dL
LEUKOCYTES UA: NEGATIVE
NITRITE: NEGATIVE
PH: 6.5 (ref 5.0–8.0)
Protein, ur: NEGATIVE mg/dL
Specific Gravity, Urine: 1.021 (ref 1.005–1.030)
Urobilinogen, UA: 0.2 mg/dL (ref 0.0–1.0)

## 2013-12-30 LAB — RAPID URINE DRUG SCREEN, HOSP PERFORMED
Amphetamines: NOT DETECTED
BARBITURATES: NOT DETECTED
BENZODIAZEPINES: NOT DETECTED
Cocaine: NOT DETECTED
Opiates: NOT DETECTED
Tetrahydrocannabinol: NOT DETECTED

## 2013-12-30 LAB — ETHANOL

## 2013-12-30 LAB — ACETAMINOPHEN LEVEL: Acetaminophen (Tylenol), Serum: <15 ug/mL (ref 10–30)

## 2013-12-30 LAB — SALICYLATE LEVEL: Salicylate Lvl: <2 mg/dL — ABNORMAL LOW (ref 2.8–20.0)

## 2013-12-30 LAB — VALPROIC ACID LEVEL: Valproic Acid Lvl: 52.8 ug/mL (ref 50.0–100.0)

## 2013-12-30 NOTE — Discharge Instructions (Signed)
You are being discharged to [facility] for treatment.

## 2013-12-30 NOTE — ED Notes (Signed)
Lab work faxed to [facility].

## 2013-12-30 NOTE — ED Notes (Addendum)
Pt presents w/ [employer], after being IVC'd.  Pt needs medical clearance and can go back to [facility].  Per IVC paperwork, Pt has Bipolar Disorder and PTSD.  Pt reports increased depressive symptoms.  Pt is having difficulty coping with the loss of her mother x 2 weeks ago.  Pt reports hearing a voice that says "why can't you make it, what's wrong with you." Pt is stating "I just feel like giving up."  Pt is unable to contract for safety and is considered a danger to herself.     Pt c/o throbbing L flank pain x over a month.
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
## 2013-12-31 NOTE — ED Provider Notes (Signed)
Medical screening examination/treatment/procedure(s) were performed by non-physician practitioner and as supervising physician I was immediately available for consultation/collaboration.   Dorie Rank, MD 12/31/13 [phone]

## 2014-01-01 ENCOUNTER — Emergency Department (HOSPITAL_COMMUNITY)
Admission: EM | Admit: 2014-01-01 | Discharge: 2014-01-01 | Disposition: A | Discharge status: Home / Self Care | Payer: Self-pay | Attending: Emergency Medicine | Admitting: Emergency Medicine

## 2014-01-01 ENCOUNTER — Encounter (HOSPITAL_COMMUNITY): Payer: Self-pay | Admitting: Emergency Medicine

## 2014-01-01 DIAGNOSIS — Z880 Allergy status to penicillin: Secondary | ICD-10-CM | POA: Insufficient documentation

## 2014-01-01 DIAGNOSIS — I10 Essential (primary) hypertension: Secondary | ICD-10-CM | POA: Insufficient documentation

## 2014-01-01 DIAGNOSIS — E119 Type 2 diabetes mellitus without complications: Secondary | ICD-10-CM | POA: Insufficient documentation

## 2014-01-01 DIAGNOSIS — Z8709 Personal history of other diseases of the respiratory system: Secondary | ICD-10-CM | POA: Insufficient documentation

## 2014-01-01 DIAGNOSIS — Z6841 Body Mass Index (BMI) 40.0 and over, adult: Secondary | ICD-10-CM | POA: Insufficient documentation

## 2014-01-01 DIAGNOSIS — Z79899 Other long term (current) drug therapy: Secondary | ICD-10-CM | POA: Insufficient documentation

## 2014-01-01 DIAGNOSIS — M1990 Unspecified osteoarthritis, unspecified site: Secondary | ICD-10-CM | POA: Insufficient documentation

## 2014-01-01 DIAGNOSIS — E662 Morbid (severe) obesity with alveolar hypoventilation: Secondary | ICD-10-CM | POA: Insufficient documentation

## 2014-01-01 DIAGNOSIS — R45851 Suicidal ideations: Secondary | ICD-10-CM

## 2014-01-01 LAB — RAPID URINE DRUG SCREEN, HOSP PERFORMED
AMPHETAMINES: NOT DETECTED
BENZODIAZEPINES: NOT DETECTED
Barbiturates: NOT DETECTED
Cocaine: NOT DETECTED
OPIATES: NOT DETECTED
Tetrahydrocannabinol: NOT DETECTED

## 2014-01-01 LAB — CBC
HCT: 37.6 % (ref 36.0–46.0)
Hemoglobin: 12.8 g/dL (ref 12.0–15.0)
MCH: 31.6 pg (ref 26.0–34.0)
MCHC: 34 g/dL (ref 30.0–36.0)
MCV: 92.8 fL (ref 78.0–100.0)
PLATELETS: 319 10*3/uL (ref 150–400)
RBC: 4.05 MIL/uL (ref 3.87–5.11)
RDW: 14 % (ref 11.5–15.5)
WBC: 8 10*3/uL (ref 4.0–10.5)

## 2014-01-01 LAB — SALICYLATE LEVEL

## 2014-01-01 LAB — COMPREHENSIVE METABOLIC PANEL
ALT: 27 U/L (ref 0–35)
AST: 29 U/L (ref 0–37)
Albumin: 3.2 g/dL — ABNORMAL LOW (ref 3.5–5.2)
Alkaline Phosphatase: 77 U/L (ref 39–117)
Anion gap: 14 (ref 5–15)
BUN: 11 mg/dL (ref 6–23)
CALCIUM: 9.2 mg/dL (ref 8.4–10.5)
CO2: 25 mEq/L (ref 19–32)
CREATININE: 0.64 mg/dL (ref 0.50–1.10)
Chloride: 100 mEq/L (ref 96–112)
GFR calc Af Amer: >90 mL/min (ref >90)
GFR calc non Af Amer: >90 mL/min (ref >90)
Glucose, Bld: 104 mg/dL — ABNORMAL HIGH (ref 70–99)
Potassium: 3.9 mEq/L (ref 3.7–5.3)
Sodium: 139 mEq/L (ref 137–147)
Total Bilirubin: 0.2 mg/dL — ABNORMAL LOW (ref 0.3–1.2)
Total Protein: 7.9 g/dL (ref 6.0–8.3)

## 2014-01-01 LAB — ETHANOL

## 2014-01-01 LAB — ACETAMINOPHEN LEVEL: Acetaminophen (Tylenol), Serum: <15 ug/mL (ref 10–30)

## 2014-01-01 NOTE — Discharge Instructions (Signed)
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
## 2014-02-22 ENCOUNTER — Encounter: Payer: Self-pay | Admitting: Internal Medicine

## 2014-02-22 ENCOUNTER — Ambulatory Visit: Payer: Self-pay | Attending: Internal Medicine | Admitting: Internal Medicine

## 2014-02-22 VITALS — BP 147/85 | HR 56 | Temp 98.2°F | Resp 16 | Ht 64.0 in | Wt 273.0 lb

## 2014-02-22 DIAGNOSIS — R5383 Other fatigue: Secondary | ICD-10-CM | POA: Insufficient documentation

## 2014-02-22 DIAGNOSIS — E669 Obesity, unspecified: Secondary | ICD-10-CM

## 2014-02-22 DIAGNOSIS — L299 Pruritus, unspecified: Secondary | ICD-10-CM | POA: Insufficient documentation

## 2014-02-22 DIAGNOSIS — Z833 Family history of diabetes mellitus: Secondary | ICD-10-CM | POA: Insufficient documentation

## 2014-02-22 DIAGNOSIS — I10 Essential (primary) hypertension: Secondary | ICD-10-CM

## 2014-02-22 DIAGNOSIS — E668 Other obesity: Secondary | ICD-10-CM | POA: Insufficient documentation

## 2014-02-22 DIAGNOSIS — Z590 Homelessness: Secondary | ICD-10-CM | POA: Insufficient documentation

## 2014-02-22 DIAGNOSIS — F319 Bipolar disorder, unspecified: Secondary | ICD-10-CM | POA: Insufficient documentation

## 2014-02-22 DIAGNOSIS — L989 Disorder of the skin and subcutaneous tissue, unspecified: Secondary | ICD-10-CM | POA: Insufficient documentation

## 2014-02-22 DIAGNOSIS — R32 Unspecified urinary incontinence: Secondary | ICD-10-CM

## 2014-02-22 DIAGNOSIS — Z79899 Other long term (current) drug therapy: Secondary | ICD-10-CM | POA: Insufficient documentation

## 2014-02-22 DIAGNOSIS — E119 Type 2 diabetes mellitus without complications: Secondary | ICD-10-CM | POA: Insufficient documentation

## 2014-02-22 DIAGNOSIS — Z6841 Body Mass Index (BMI) 40.0 and over, adult: Secondary | ICD-10-CM | POA: Insufficient documentation

## 2014-02-22 DIAGNOSIS — E1169 Type 2 diabetes mellitus with other specified complication: Secondary | ICD-10-CM

## 2014-02-22 DIAGNOSIS — R0602 Shortness of breath: Secondary | ICD-10-CM | POA: Insufficient documentation

## 2014-02-22 DIAGNOSIS — K219 Gastro-esophageal reflux disease without esophagitis: Secondary | ICD-10-CM | POA: Insufficient documentation

## 2014-02-22 LAB — GLUCOSE, POCT (MANUAL RESULT ENTRY): POC Glucose: 104 mg/dl — AB (ref 70–99)

## 2014-02-22 LAB — POCT GLYCOSYLATED HEMOGLOBIN (HGB A1C): Hemoglobin A1C: 5.8

## 2014-02-22 MED ORDER — FUROSEMIDE 20 MG PO TABS: 20.0000 mg | ORAL_TABLET | Freq: Every day | ORAL | Status: DC

## 2014-02-22 MED ORDER — OXYBUTYNIN CHLORIDE 5 MG PO TABS: 5.0000 mg | ORAL_TABLET | Freq: Two times a day (BID) | ORAL | Status: DC

## 2014-02-22 MED ORDER — METFORMIN HCL 850 MG PO TABS: 850.0000 mg | ORAL_TABLET | Freq: Every morning | ORAL | Status: DC

## 2014-02-22 NOTE — Progress Notes (Signed)
Establish Care Complaining Joint pain. Itching on feet Swelling ankles

## 2014-02-22 NOTE — Patient Instructions (Signed)
Diabetes and Foot Care Diabetes may cause you to have problems because of poor blood supply (circulation) to your feet and legs. This may cause the skin on your feet to become thinner, break easier, and heal more slowly. Your skin may become dry, and the skin may peel and crack. You may also have nerve damage in your legs and feet causing decreased feeling in them. You may not notice minor injuries to your feet that could lead to infections or more serious problems. Taking care of your feet is one of the most important things you can do for yourself.  HOME CARE INSTRUCTIONS  Wear shoes at all times, even in the house. Do not go barefoot. Bare feet are easily injured.  Check your feet daily for blisters, cuts, and redness. If you cannot see the bottom of your feet, use a mirror or ask someone for help.  Wash your feet with warm water (do not use hot water) and mild soap. Then pat your feet and the areas between your toes until they are completely dry. Do not soak your feet as this can dry your skin.  Apply a moisturizing lotion or petroleum jelly (that does not contain alcohol and is unscented) to the skin on your feet and to dry, brittle toenails. Do not apply lotion between your toes.  Trim your toenails straight across. Do not dig under them or around the cuticle. File the edges of your nails with an emery board or nail file.  Do not cut corns or calluses or try to remove them with medicine.  Wear clean socks or stockings every day. Make sure they are not too tight. Do not wear knee-high stockings since they may decrease blood flow to your legs.  Wear shoes that fit properly and have enough cushioning. To break in new shoes, wear them for just a few hours a day. This prevents you from injuring your feet. Always look in your shoes before you put them on to be sure there are no objects inside.  Do not cross your legs. This may decrease the blood flow to your feet.  If you find a minor scrape,  cut, or break in the skin on your feet, keep it and the skin around it clean and dry. These areas may be cleansed with mild soap and water. Do not cleanse the area with peroxide, alcohol, or iodine.  When you remove an adhesive bandage, be sure not to damage the skin around it.  If you have a wound, look at it several times a day to make sure it is healing.  Do not use heating pads or hot water bottles. They may burn your skin. If you have lost feeling in your feet or legs, you may not know it is happening until it is too late.  Make sure your health care provider performs a complete foot exam at least annually or more often if you have foot problems. Report any cuts, sores, or bruises to your health care provider immediately. SEEK MEDICAL CARE IF:   You have an injury that is not healing.  You have cuts or breaks in the skin.  You have an ingrown nail.  You notice redness on your legs or feet.  You feel burning or tingling in your legs or feet.  You have pain or cramps in your legs and feet.  Your legs or feet are numb.  Your feet always feel cold. SEEK IMMEDIATE MEDICAL CARE IF:   There is increasing redness,   swelling, or pain in or around a wound.  There is a red line that goes up your leg.  Pus is coming from a wound.  You develop a fever or as directed by your health care provider.  You notice a bad smell coming from an ulcer or wound. Document Released: 03/14/2000 Document Revised: 11/17/2012 Document Reviewed: 08/24/2012 ExitCare Patient Information 2015 [employer]. This information is not intended to replace advice given to you by your health care provider. Make sure you discuss any questions you have with your health care provider.  
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
## 2014-02-27 ENCOUNTER — Ambulatory Visit: Payer: Self-pay | Attending: Internal Medicine | Admitting: Internal Medicine

## 2014-02-27 ENCOUNTER — Encounter: Payer: Self-pay | Admitting: Internal Medicine

## 2014-02-27 VITALS — BP 141/81 | HR 57 | Temp 97.9°F | Resp 18 | Ht 64.0 in | Wt 277.0 lb

## 2014-02-27 DIAGNOSIS — Z0001 Encounter for general adult medical examination with abnormal findings: Secondary | ICD-10-CM | POA: Insufficient documentation

## 2014-02-27 DIAGNOSIS — R609 Edema, unspecified: Secondary | ICD-10-CM | POA: Insufficient documentation

## 2014-02-27 DIAGNOSIS — E1169 Type 2 diabetes mellitus with other specified complication: Secondary | ICD-10-CM

## 2014-02-27 DIAGNOSIS — N750 Cyst of Bartholin's gland: Secondary | ICD-10-CM | POA: Insufficient documentation

## 2014-02-27 DIAGNOSIS — E669 Obesity, unspecified: Secondary | ICD-10-CM

## 2014-02-27 DIAGNOSIS — Z0000 Encounter for general adult medical examination without abnormal findings: Secondary | ICD-10-CM

## 2014-02-27 DIAGNOSIS — E119 Type 2 diabetes mellitus without complications: Secondary | ICD-10-CM | POA: Insufficient documentation

## 2014-02-27 DIAGNOSIS — Z6841 Body Mass Index (BMI) 40.0 and over, adult: Secondary | ICD-10-CM | POA: Insufficient documentation

## 2014-02-27 LAB — CERVICOVAGINAL ANCILLARY ONLY
WET PREP (BD AFFIRM): NEGATIVE
WET PREP (BD AFFIRM): POSITIVE — AB
Wet Prep (BD Affirm): NEGATIVE

## 2014-02-27 LAB — GLUCOSE, POCT (MANUAL RESULT ENTRY): POC GLUCOSE: 84 mg/dL (ref 70–99)

## 2014-02-27 MED ORDER — GLUCOSE BLOOD VI STRP: ORAL_STRIP | Status: DC

## 2014-02-27 MED ORDER — FREESTYLE SYSTEM KIT: 1.0000 | PACK | Status: DC | PRN

## 2014-02-27 MED ORDER — FREESTYLE LANCETS MISC: Status: DC

## 2014-02-27 NOTE — Patient Instructions (Signed)
Bartholin's Cyst or Abscess °Bartholin's glands are small glands located within the folds of skin (labia) along the sides of the lower opening of the vagina (birth canal). A cyst may develop when the duct of the gland becomes blocked. When this happens, fluid that accumulates within the cyst can become infected. This is known as an abscess. The Bartholin gland produces a mucous fluid to lubricate the outside of the vagina during sexual intercourse. °SYMPTOMS  °· Patients with a small cyst may not have any symptoms. °· Mild discomfort to severe pain depending on the size of the cyst and if it is infected (abscess). °· Pain, redness, and swelling around the lower opening of the vagina. °· Painful intercourse. °· Pressure in the perineal area. °· Swelling of the lips of the vagina (labia). °· The cyst or abscess can be on one side or both sides of the vagina. °DIAGNOSIS  °· A large swelling is seen in the lower vagina area by your caregiver. °· Painful to touch. °· Redness and pain, if it is an abscess. °TREATMENT  °· Sometimes the cyst will go away on its own. °· Apply warm wet compresses to the area or take hot sitz baths several times a day. °· An incision to drain the cyst or abscess with local anesthesia. °· Culture the pus, if it is an abscess. °· Antibiotic treatment, if it is an abscess. °· Cut open the gland and suture the edges to make the opening of the gland bigger (marsupialization). °· Remove the whole gland if the cyst or abscess returns. °PREVENTION  °· Practice good hygiene. °· Clean the vaginal area with a mild soap and soft cloth when bathing. °· Do not rub hard in the vaginal area when bathing. °· Protect the crotch area with a padded cushion if you take long bike rides or ride horses. °· Be sure you are well lubricated when you have sexual intercourse. °HOME CARE INSTRUCTIONS  °· If your cyst or abscess was opened, a small piece of gauze, or a drain, may have been placed in the wound to allow  drainage. Do not remove this gauze or drain unless directed by your caregiver. °· Wear feminine pads, not tampons, as needed for any drainage or bleeding. °· If antibiotics were prescribed, take them exactly as directed. Finish the entire course. °· Only take over-the-counter or prescription medicines for pain, discomfort, or fever as directed by your caregiver. °SEEK IMMEDIATE MEDICAL CARE IF:  °· You have an increase in pain, redness, swelling, or drainage. °· You have bleeding from the wound which results in the use of more than the number of pads suggested by your caregiver in 24 hours. °· You have chills. °· You have a fever. °· You develop any new problems (symptoms) or aggravation of your existing condition. °MAKE SURE YOU:  °· Understand these instructions. °· Will watch your condition. °· Will get help right away if you are not doing well or get worse. °Document Released: 03/17/2005 Document Revised: 06/09/2011 Document Reviewed: 11/03/2007 °ExitCare® Patient Information ©2015 [employer]. This information is not intended to replace advice given to you by your health care provider. Make sure you discuss any questions you have with your health care provider. ° °
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
## 2014-02-27 NOTE — Progress Notes (Signed)
Annual physical and pap smear Stated has a sore on vaginal area, itching, no pain

## 2014-02-28 ENCOUNTER — Ambulatory Visit: Payer: Self-pay | Attending: Internal Medicine

## 2014-02-28 DIAGNOSIS — Z0000 Encounter for general adult medical examination without abnormal findings: Secondary | ICD-10-CM

## 2014-02-28 LAB — CYTOLOGY - PAP

## 2014-02-28 LAB — LIPID PANEL
CHOL/HDL RATIO: 2.4 ratio
Cholesterol: 205 mg/dL — ABNORMAL HIGH (ref 0–200)
HDL: 87 mg/dL (ref >39)
LDL Cholesterol: 87 mg/dL (ref 0–99)
TRIGLYCERIDES: 155 mg/dL — AB (ref <150)
VLDL: 31 mg/dL (ref 0–40)

## 2014-02-28 LAB — CERVICOVAGINAL ANCILLARY ONLY
CHLAMYDIA, DNA PROBE: NEGATIVE
Neisseria Gonorrhea: NEGATIVE

## 2014-02-28 LAB — RPR

## 2014-03-01 ENCOUNTER — Encounter: Payer: Self-pay | Admitting: Obstetrics and Gynecology

## 2014-03-01 LAB — HIV ANTIBODY (ROUTINE TESTING W REFLEX): HIV: NONREACTIVE

## 2014-03-07 ENCOUNTER — Telehealth: Payer: Self-pay | Admitting: Emergency Medicine

## 2014-03-07 MED ORDER — METRONIDAZOLE 500 MG PO TABS: 500.0000 mg | ORAL_TABLET | Freq: Two times a day (BID) | ORAL | Status: DC

## 2014-03-07 NOTE — Telephone Encounter (Signed)
-----   Message from Lance Bosch, NP sent at 02/28/2014 10:44 AM EST ----- Patient positive for Bacterial Vaginosis . Please explain this is not a STD, but a imbalance of the vaginal pH. Please send Flagyl 500 mg BID for 7 days. No refills, no alcohol while on this medication.  Still awaiting other pap results

## 2014-03-07 NOTE — Telephone Encounter (Signed)
Pt given pap smear results Medication Flagyl 500 mg tab BID e-scribed to [facility] pharmacy Informed to refrain from alcohol while taking medication

## 2014-03-09 ENCOUNTER — Ambulatory Visit: Payer: Self-pay | Attending: Internal Medicine | Admitting: Internal Medicine

## 2014-03-09 ENCOUNTER — Encounter: Payer: Self-pay | Admitting: Internal Medicine

## 2014-03-09 VITALS — BP 149/105 | HR 64 | Temp 98.0°F | Resp 16 | Ht 64.0 in | Wt 276.0 lb

## 2014-03-09 DIAGNOSIS — Z79899 Other long term (current) drug therapy: Secondary | ICD-10-CM | POA: Insufficient documentation

## 2014-03-09 DIAGNOSIS — M069 Rheumatoid arthritis, unspecified: Secondary | ICD-10-CM | POA: Insufficient documentation

## 2014-03-09 DIAGNOSIS — I10 Essential (primary) hypertension: Secondary | ICD-10-CM | POA: Insufficient documentation

## 2014-03-09 DIAGNOSIS — Z880 Allergy status to penicillin: Secondary | ICD-10-CM | POA: Insufficient documentation

## 2014-03-09 DIAGNOSIS — Z885 Allergy status to narcotic agent status: Secondary | ICD-10-CM | POA: Insufficient documentation

## 2014-03-09 DIAGNOSIS — H538 Other visual disturbances: Secondary | ICD-10-CM | POA: Insufficient documentation

## 2014-03-09 DIAGNOSIS — J449 Chronic obstructive pulmonary disease, unspecified: Secondary | ICD-10-CM | POA: Insufficient documentation

## 2014-03-09 DIAGNOSIS — E785 Hyperlipidemia, unspecified: Secondary | ICD-10-CM | POA: Insufficient documentation

## 2014-03-09 DIAGNOSIS — Z888 Allergy status to other drugs, medicaments and biological substances status: Secondary | ICD-10-CM | POA: Insufficient documentation

## 2014-03-09 DIAGNOSIS — E119 Type 2 diabetes mellitus without complications: Secondary | ICD-10-CM | POA: Insufficient documentation

## 2014-03-09 DIAGNOSIS — Z6841 Body Mass Index (BMI) 40.0 and over, adult: Secondary | ICD-10-CM | POA: Insufficient documentation

## 2014-03-09 DIAGNOSIS — M109 Gout, unspecified: Secondary | ICD-10-CM | POA: Insufficient documentation

## 2014-03-09 DIAGNOSIS — K219 Gastro-esophageal reflux disease without esophagitis: Secondary | ICD-10-CM | POA: Insufficient documentation

## 2014-03-09 DIAGNOSIS — F319 Bipolar disorder, unspecified: Secondary | ICD-10-CM | POA: Insufficient documentation

## 2014-03-09 LAB — GLUCOSE, POCT (MANUAL RESULT ENTRY): POC Glucose: 116 mg/dl — AB (ref 70–99)
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
## 2014-03-09 NOTE — Progress Notes (Signed)
Pt is here today following up on her HTN, GOUTand diabetes. Pt wants a referral to a eye doctor. Pt has a puss filled bump on her vaginal area that has been there for 3 years.

## 2014-03-09 NOTE — Patient Instructions (Addendum)
Please call Ruth to get pricing on eye exam  Increase Clonidine 0.2mg  to three times per day

## 2014-03-10 ENCOUNTER — Ambulatory Visit (HOSPITAL_COMMUNITY)
Admission: RE | Admit: 2014-03-10 | Discharge: 2014-03-10 | Disposition: A | Discharge status: Home / Self Care | Payer: Self-pay | Source: Ambulatory Visit | Attending: Internal Medicine | Admitting: Internal Medicine

## 2014-03-10 DIAGNOSIS — R4182 Altered mental status, unspecified: Secondary | ICD-10-CM | POA: Insufficient documentation

## 2014-03-10 DIAGNOSIS — I10 Essential (primary) hypertension: Secondary | ICD-10-CM | POA: Insufficient documentation

## 2014-03-10 DIAGNOSIS — R609 Edema, unspecified: Secondary | ICD-10-CM | POA: Insufficient documentation

## 2014-03-10 DIAGNOSIS — I369 Nonrheumatic tricuspid valve disorder, unspecified: Secondary | ICD-10-CM

## 2014-03-10 DIAGNOSIS — E785 Hyperlipidemia, unspecified: Secondary | ICD-10-CM | POA: Insufficient documentation

## 2014-03-10 DIAGNOSIS — E119 Type 2 diabetes mellitus without complications: Secondary | ICD-10-CM | POA: Insufficient documentation

## 2014-03-10 NOTE — Progress Notes (Signed)
Echocardiogram 2D Echocardiogram has been performed.  Sabrina [name] 03/10/2014, 9:21 AM

## 2014-03-15 ENCOUNTER — Telehealth: Payer: Self-pay | Admitting: Emergency Medicine

## 2014-03-15 NOTE — Telephone Encounter (Signed)
Left message with normal Echo results 

## 2014-03-15 NOTE — Telephone Encounter (Signed)
-----   Message from Lance Bosch, NP sent at 03/12/2014 10:17 PM EST ----- Normal 2D echo.  Heart function is normal. Her heart has nothing to do with her BLE edema.

## 2014-03-21 ENCOUNTER — Telehealth: Payer: Self-pay | Admitting: Emergency Medicine

## 2014-03-21 NOTE — Telephone Encounter (Signed)
-----   Message from Lance Bosch, NP sent at 03/02/2014 11:29 PM EST ----- Labs are within normal limits Patient pap is negative for malignancies and infections. Will repeat in 3 years.

## 2014-03-21 NOTE — Telephone Encounter (Signed)
Left message for pt to call for lab results 

## 2014-04-03 ENCOUNTER — Ambulatory Visit: Payer: Self-pay

## 2014-04-13 ENCOUNTER — Encounter: Payer: Self-pay | Admitting: Obstetrics and Gynecology

## 2014-05-01 ENCOUNTER — Ambulatory Visit: Payer: Self-pay | Attending: Internal Medicine | Admitting: Internal Medicine

## 2014-05-01 ENCOUNTER — Other Ambulatory Visit: Payer: Self-pay | Admitting: Obstetrics and Gynecology

## 2014-05-01 ENCOUNTER — Encounter: Payer: Self-pay | Admitting: Internal Medicine

## 2014-05-01 VITALS — BP 167/83 | HR 74 | Temp 98.1°F | Ht 64.0 in | Wt 268.2 lb

## 2014-05-01 DIAGNOSIS — N750 Cyst of Bartholin's gland: Secondary | ICD-10-CM

## 2014-05-01 DIAGNOSIS — K219 Gastro-esophageal reflux disease without esophagitis: Secondary | ICD-10-CM

## 2014-05-01 DIAGNOSIS — I10 Essential (primary) hypertension: Secondary | ICD-10-CM

## 2014-05-01 DIAGNOSIS — Z1231 Encounter for screening mammogram for malignant neoplasm of breast: Secondary | ICD-10-CM

## 2014-05-01 MED ORDER — OMEPRAZOLE 20 MG PO CPDR: 20.0000 mg | DELAYED_RELEASE_CAPSULE | Freq: Every morning | ORAL | Status: DC

## 2014-05-01 NOTE — Progress Notes (Signed)
Patient presents today as a walk in for HTN. today's BP reading is 167/83. Patient states that yesterday reading was 820/813 and that systolic number has been staying at 213

## 2014-05-01 NOTE — Patient Instructions (Signed)
Please increase your Clonidine to three times per day. Please come back in 2 weeks for me to check your pressure while on the increased dose of medication.  Please take your medication on the day of your BP recheck so I may get a accurate reading of the medication.   DASH Eating Plan DASH stands for "Dietary Approaches to Stop Hypertension." The DASH eating plan is a healthy eating plan that has been shown to reduce high blood pressure (hypertension). Additional health benefits may include reducing the risk of type 2 diabetes mellitus, heart disease, and stroke. The DASH eating plan may also help with weight loss. WHAT DO I NEED TO KNOW ABOUT THE DASH EATING PLAN? For the DASH eating plan, you will follow these general guidelines:  Choose foods with a percent daily value for sodium of less than 5% (as listed on the food label).  Use salt-free seasonings or herbs instead of table salt or sea salt.  Check with your health care provider or pharmacist before using salt substitutes.  Eat lower-sodium products, often labeled as "lower sodium" or "no salt added."  Eat fresh foods.  Eat more vegetables, fruits, and low-fat dairy products.  Choose whole grains. Look for the word "whole" as the first word in the ingredient list.  Choose fish and skinless chicken or [place] more often than red meat. Limit fish, poultry, and meat to 6 oz (170 g) each day.  Limit sweets, desserts, sugars, and sugary drinks.  Choose heart-healthy fats.  Limit cheese to 1 oz (28 g) per day.  Eat more home-cooked food and less restaurant, buffet, and fast food.  Limit fried foods.  Cook foods using methods other than frying.  Limit canned vegetables. If you do use them, rinse them well to decrease the sodium.  When eating at a restaurant, ask that your food be prepared with less salt, or no salt if possible. WHAT FOODS CAN I EAT? Seek help from a dietitian for individual calorie needs. Grains Whole grain or  whole wheat bread. Brown rice. Whole grain or whole wheat pasta. Quinoa, bulgur, and whole grain cereals. Low-sodium cereals. Corn or whole wheat flour tortillas. Whole grain cornbread. Whole grain crackers. Low-sodium crackers. Vegetables Fresh or frozen vegetables (raw, steamed, roasted, or grilled). Low-sodium or reduced-sodium tomato and vegetable juices. Low-sodium or reduced-sodium tomato sauce and paste. Low-sodium or reduced-sodium canned vegetables.  Fruits All fresh, canned (in natural juice), or frozen fruits. Meat and Other Protein Products Ground beef (85% or leaner), grass-fed beef, or beef trimmed of fat. Skinless chicken or [place]. Ground chicken or [place]. Pork trimmed of fat. All fish and seafood. Eggs. Dried beans, peas, or lentils. Unsalted nuts and seeds. Unsalted canned beans. Dairy Low-fat dairy products, such as skim or 1% milk, 2% or reduced-fat cheeses, low-fat ricotta or cottage cheese, or plain low-fat yogurt. Low-sodium or reduced-sodium cheeses. Fats and Oils Tub margarines without trans fats. Light or reduced-fat mayonnaise and salad dressings (reduced sodium). Avocado. Safflower, olive, or canola oils. Natural peanut or almond butter. Other Unsalted popcorn and pretzels. The items listed above may not be a complete list of recommended foods or beverages. Contact your dietitian for more options. WHAT FOODS ARE NOT RECOMMENDED? Grains White bread. White pasta. White rice. Refined cornbread. Bagels and croissants. Crackers that contain trans fat. Vegetables Creamed or fried vegetables. Vegetables in a cheese sauce. Regular canned vegetables. Regular canned tomato sauce and paste. Regular tomato and vegetable juices. Fruits Dried fruits. Canned fruit in light or  heavy syrup. Fruit juice. Meat and Other Protein Products Fatty cuts of meat. Ribs, chicken wings, bacon, sausage, bologna, salami, chitterlings, fatback, hot dogs, bratwurst, and packaged luncheon meats.  Salted nuts and seeds. Canned beans with salt. Dairy Whole or 2% milk, cream, half-and-half, and cream cheese. Whole-fat or sweetened yogurt. Full-fat cheeses or blue cheese. Nondairy creamers and whipped toppings. Processed cheese, cheese spreads, or cheese curds. Condiments Onion and garlic salt, seasoned salt, table salt, and sea salt. Canned and packaged gravies. Worcestershire sauce. Tartar sauce. Barbecue sauce. Teriyaki sauce. Soy sauce, including reduced sodium. Steak sauce. Fish sauce. Oyster sauce. Cocktail sauce. Horseradish. Ketchup and mustard. Meat flavorings and tenderizers. Bouillon cubes. Hot sauce. Tabasco sauce. Marinades. Taco seasonings. Relishes. Fats and Oils Butter, stick margarine, lard, shortening, ghee, and bacon fat. Coconut, palm kernel, or palm oils. Regular salad dressings. Other Pickles and olives. Salted popcorn and pretzels. The items listed above may not be a complete list of foods and beverages to avoid. Contact your dietitian for more information. WHERE CAN I FIND MORE INFORMATION? National Heart, Lung, and Blood Institute: travelstabloid.com Document Released: 03/06/2011 Document Revised: 08/01/2013 Document Reviewed: 01/19/2013 [facility] Patient Information 2015 [place], [place]. This information is not intended to replace advice given to you by your health care provider. Make sure you discuss any questions you have with your health care provider.
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
## 2014-05-17 ENCOUNTER — Telehealth: Payer: Self-pay | Admitting: *Deleted

## 2014-05-17 NOTE — Telephone Encounter (Signed)
Pt left message stating that she had a referral appt due to bartholin's cyst which she did not keep. She would like to re-schedule her appt. Her doctor is Chari Manning @ [facility].  I called pt and advised her of appt rescheduled to 3/2 @ 1415.  Pt voiced understanding.

## 2014-05-18 ENCOUNTER — Ambulatory Visit (HOSPITAL_COMMUNITY)
Admission: RE | Admit: 2014-05-18 | Discharge: 2014-05-18 | Disposition: A | Discharge status: Home / Self Care | Payer: Self-pay | Source: Ambulatory Visit | Attending: Obstetrics and Gynecology | Admitting: Obstetrics and Gynecology

## 2014-05-18 ENCOUNTER — Encounter (HOSPITAL_COMMUNITY): Payer: Self-pay

## 2014-05-18 VITALS — BP 140/86 | Temp 97.9°F | Ht 64.0 in | Wt 267.4 lb

## 2014-05-18 DIAGNOSIS — Z1231 Encounter for screening mammogram for malignant neoplasm of breast: Secondary | ICD-10-CM

## 2014-05-18 DIAGNOSIS — Z1239 Encounter for other screening for malignant neoplasm of breast: Secondary | ICD-10-CM

## 2014-05-18 HISTORY — DX: Depression, unspecified: F32.A

## 2014-05-18 HISTORY — DX: Major depressive disorder, single episode, unspecified: F32.9

## 2014-05-18 NOTE — Progress Notes (Signed)
No complaints today.  Pap Smear:  Pap smear not completed today. Last Pap smear was 02/27/2014 at [facility] and normal. Per patient has no history of an abnormal Pap smear. Last two Pap smear results are in EPIC.  Physical exam: Breasts Breasts symmetrical. No skin abnormalities bilateral breasts. No nipple retraction bilateral breasts. No nipple discharge bilateral breasts. No lymphadenopathy. No lumps palpated bilateral breasts. No complaints of pain or tenderness on exam. Patient escorted to mammography for a screening mammogram.        Pelvic/Bimanual No Pap smear completed today since last Pap smear was 02/27/2014. Pap smear not indicated per BCCCP guidelines.

## 2014-05-18 NOTE — Patient Instructions (Signed)
Education materials on self breast awareness given. Explained to Sabrina [name] that she did not need a Pap smear today due to last Pap smear was 02/27/2014. Let her know BCCCP will cover Pap smears every 3 years unless has a history of abnormal Pap smears. Let [facility] know the [facility] will follow up with her within the next couple weeks with results of mammogram by letter or phone. [name] verbalized understanding. Patient escorted to mammography for a screening mammogram.  Genie Mirabal, Arvil Chaco, RN 9:02 AM

## 2014-05-29 ENCOUNTER — Telehealth: Payer: Self-pay | Admitting: Internal Medicine

## 2014-05-29 ENCOUNTER — Other Ambulatory Visit: Payer: Self-pay | Admitting: *Deleted

## 2014-05-29 ENCOUNTER — Other Ambulatory Visit: Payer: Self-pay | Admitting: Internal Medicine

## 2014-05-29 DIAGNOSIS — E1169 Type 2 diabetes mellitus with other specified complication: Secondary | ICD-10-CM

## 2014-05-29 DIAGNOSIS — R32 Unspecified urinary incontinence: Secondary | ICD-10-CM

## 2014-05-29 DIAGNOSIS — E669 Obesity, unspecified: Principal | ICD-10-CM

## 2014-05-29 MED ORDER — METFORMIN HCL 850 MG PO TABS: 850.0000 mg | ORAL_TABLET | Freq: Every morning | ORAL | Status: DC

## 2014-05-29 MED ORDER — OXYBUTYNIN CHLORIDE 5 MG PO TABS: 5.0000 mg | ORAL_TABLET | Freq: Two times a day (BID) | ORAL | Status: DC

## 2014-05-29 NOTE — Telephone Encounter (Signed)
Patient came into facility to request a med refill for Metformin, Oxybutynin, and Omeprazole. Please f/u with pt.

## 2014-05-31 ENCOUNTER — Telehealth: Payer: Self-pay | Admitting: Internal Medicine

## 2014-05-31 ENCOUNTER — Ambulatory Visit (INDEPENDENT_AMBULATORY_CARE_PROVIDER_SITE_OTHER): Payer: Self-pay | Admitting: Obstetrics & Gynecology

## 2014-05-31 ENCOUNTER — Encounter: Payer: Self-pay | Admitting: Obstetrics & Gynecology

## 2014-05-31 VITALS — BP 141/75 | HR 65 | Temp 98.1°F | Ht 64.0 in | Wt 269.5 lb

## 2014-05-31 DIAGNOSIS — D239 Other benign neoplasm of skin, unspecified: Secondary | ICD-10-CM

## 2014-05-31 DIAGNOSIS — D219 Benign neoplasm of connective and other soft tissue, unspecified: Secondary | ICD-10-CM

## 2014-05-31 DIAGNOSIS — R350 Frequency of micturition: Secondary | ICD-10-CM

## 2014-05-31 LAB — POCT URINALYSIS DIP (DEVICE)
BILIRUBIN URINE: NEGATIVE
Glucose, UA: NEGATIVE mg/dL
Hgb urine dipstick: NEGATIVE
Leukocytes, UA: NEGATIVE
Nitrite: NEGATIVE
PH: 6 (ref 5.0–8.0)
Protein, ur: NEGATIVE mg/dL
Specific Gravity, Urine: 1.015 (ref 1.005–1.030)
Urobilinogen, UA: 0.2 mg/dL (ref 0.0–1.0)

## 2014-05-31 NOTE — Progress Notes (Signed)
Here for bartholin cyst. Also c/o urinary frequency and wants urine checked for uti.

## 2014-05-31 NOTE — Patient Instructions (Signed)
Vulva Biopsy A vulva biopsy is a procedure where a small piece of tissue is taken from the vulva, or outside of the female genital area. The vulva includes the folds of skin (labia), the clitoris, and the openings of the urethra and vagina. This sample is taken to obtain more information or a diagnosis regarding a lesion, growth, rash, blister, or some abnormal discoloration. It can also be done to remove an unwanted mole or wart. LET YOUR CAREGIVER KNOW ABOUT:  Any allergies to food or medicine.  Medicines taken, including vitamins, herbs, eyedrops, and over-the-counter medicines and creams.  Use of steroids (by mouth or creams).  Previous problems with anesthetics or numbing medicines.  History of bleeding problems or blood clots.  Previous surgery.  Other health problems, including diabetes and kidney problems.  Possibility of pregnancy, if this applies. RISKS AND COMPLICATIONS Generally, vulva biopsy is a safe procedure. However, as with any surgical procedure, complications can occur. Possible complications include:  Bleeding from the biopsy site.  Infection.  Injury to organs or structures near the biopsy site.  Long-lasting (chronic) pain at the biopsy site. BEFORE THE PROCEDURE  Wear loose and comfortable pants and underwear to the hospital or clinic. This will lessen rubbing on the biopsy site once the procedure is finished.  Bring sanitary pads and panty liners to use after the procedure. PROCEDURE  The biopsy site will be cleaned with an antiseptic. You will be given an injection of an anesthetic medicine in the biopsy site using a needle. A small tissue sample will be removed (excised). This sample may be sent for further examination depending on why you are having a biopsy. A medicine may be applied to the biopsy site to help stop the bleeding. Finally, the biopsy site may be closed with dissolvable stitches (sutures).  AFTER THE PROCEDURE  You may be given pain  medicine to help with any discomfort.  You may notice a small amount of bleeding from the biopsy area. This is normal. Wear a sanitary pad.  Do not rub the biopsy area after urinating. Gently pat the area dry or use a bottle filled with warm water (peri-bottle) to clean the area.  Your sutures should dissolve within one week or may need to be removed. Document Released: 03/03/2012 Document Reviewed: 03/03/2012 [facility] Patient Information 2015 [place]. This information is not intended to replace advice given to you by your health care provider. Make sure you discuss any questions you have with your health care provider.

## 2014-05-31 NOTE — Telephone Encounter (Signed)
Nurse from Dr[facility]) needs medical clearance for pt's surgery. Please f/u with clinic at phone number provided.
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
## 2014-06-01 NOTE — Telephone Encounter (Signed)
Please call them and see if they have a form they want me to sign off on.

## 2014-06-03 ENCOUNTER — Emergency Department (HOSPITAL_COMMUNITY): Payer: Self-pay

## 2014-06-03 ENCOUNTER — Encounter (HOSPITAL_COMMUNITY): Payer: Self-pay | Admitting: *Deleted

## 2014-06-03 ENCOUNTER — Emergency Department (HOSPITAL_COMMUNITY)
Admission: EM | Admit: 2014-06-03 | Discharge: 2014-06-03 | Disposition: A | Discharge status: Home / Self Care | Payer: Self-pay | Attending: Emergency Medicine | Admitting: Emergency Medicine

## 2014-06-03 DIAGNOSIS — Z5900 Homelessness unspecified: Secondary | ICD-10-CM

## 2014-06-03 DIAGNOSIS — R52 Pain, unspecified: Secondary | ICD-10-CM

## 2014-06-03 DIAGNOSIS — J449 Chronic obstructive pulmonary disease, unspecified: Secondary | ICD-10-CM | POA: Insufficient documentation

## 2014-06-03 DIAGNOSIS — I10 Essential (primary) hypertension: Secondary | ICD-10-CM | POA: Insufficient documentation

## 2014-06-03 DIAGNOSIS — R079 Chest pain, unspecified: Secondary | ICD-10-CM | POA: Insufficient documentation

## 2014-06-03 DIAGNOSIS — Z79899 Other long term (current) drug therapy: Secondary | ICD-10-CM | POA: Insufficient documentation

## 2014-06-03 DIAGNOSIS — M1990 Unspecified osteoarthritis, unspecified site: Secondary | ICD-10-CM | POA: Insufficient documentation

## 2014-06-03 DIAGNOSIS — Z880 Allergy status to penicillin: Secondary | ICD-10-CM | POA: Insufficient documentation

## 2014-06-03 DIAGNOSIS — K219 Gastro-esophageal reflux disease without esophagitis: Secondary | ICD-10-CM | POA: Insufficient documentation

## 2014-06-03 DIAGNOSIS — F319 Bipolar disorder, unspecified: Secondary | ICD-10-CM | POA: Insufficient documentation

## 2014-06-03 DIAGNOSIS — E119 Type 2 diabetes mellitus without complications: Secondary | ICD-10-CM | POA: Insufficient documentation

## 2014-06-03 LAB — URINALYSIS, ROUTINE W REFLEX MICROSCOPIC
Bilirubin Urine: NEGATIVE
Glucose, UA: NEGATIVE mg/dL
HGB URINE DIPSTICK: NEGATIVE
KETONES UR: NEGATIVE mg/dL
LEUKOCYTES UA: NEGATIVE
Nitrite: NEGATIVE
PH: 7.5 (ref 5.0–8.0)
Protein, ur: NEGATIVE mg/dL
SPECIFIC GRAVITY, URINE: 1.006 (ref 1.005–1.030)
UROBILINOGEN UA: 0.2 mg/dL (ref 0.0–1.0)

## 2014-06-03 LAB — I-STAT CHEM 8, ED
BUN: 17 mg/dL (ref 6–23)
CREATININE: 0.8 mg/dL (ref 0.50–1.10)
Calcium, Ion: 1.17 mmol/L (ref 1.12–1.23)
Chloride: 103 mmol/L (ref 96–112)
Glucose, Bld: 100 mg/dL — ABNORMAL HIGH (ref 70–99)
HEMATOCRIT: 41 % (ref 36.0–46.0)
HEMOGLOBIN: 13.9 g/dL (ref 12.0–15.0)
Potassium: 4.3 mmol/L (ref 3.5–5.1)
Sodium: 141 mmol/L (ref 135–145)
TCO2: 27 mmol/L (ref 0–100)

## 2014-06-03 LAB — I-STAT TROPONIN, ED: TROPONIN I, POC: 0 ng/mL (ref 0.00–0.08)

## 2014-06-03 NOTE — Discharge Instructions (Signed)

## 2014-06-03 NOTE — ED Notes (Signed)
Pt. Is homeless and states yesterday she was having centralized chest pain with no radiation. 7/10 discomfort. Pt. States she has just been having chills and just needs to be checked out

## 2014-06-03 NOTE — ED Notes (Signed)
Pt to take a shower prior to discharge while waiting on SW to speak with her about referrals for homeless shelter
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
## 2014-06-06 ENCOUNTER — Encounter: Payer: Self-pay | Admitting: Internal Medicine

## 2014-06-06 NOTE — Telephone Encounter (Signed)
Please call patient and have her to come in for a nurse visit t9o have her BP rechecked. Please explain to patient that if her BP is not within normal limits I cannot release her for GYN surgical clearance. So make sure she takes her medication as prescribed for a few days before coming in for a nurse visit so I may get a accurate reading.

## 2014-06-06 NOTE — Progress Notes (Signed)
Patient ID: Sabrina [name], female   DOB: 1960-10-09, 53 y.o.   MRN: [id number]  Spoke with Nurse from [facility] and stated that if patients blood pressure is WNL she may have surgical clearance. I will attempt to call patient and have her come back for a BP recheck since last medication changes before releasing medical clearance.

## 2014-06-20 ENCOUNTER — Telehealth: Payer: Self-pay | Admitting: Internal Medicine

## 2014-06-20 ENCOUNTER — Other Ambulatory Visit: Payer: Self-pay | Admitting: *Deleted

## 2014-06-20 MED ORDER — CLONIDINE HCL 0.2 MG PO TABS: 0.2000 mg | ORAL_TABLET | Freq: Three times a day (TID) | ORAL | Status: DC

## 2014-06-20 NOTE — Telephone Encounter (Signed)
Patient came into facility to request a med refill for her blood pressure medication, patient states that she has a headache. Please f/u with pt.

## 2014-06-20 NOTE — Telephone Encounter (Signed)
Pt came into office requesting medication refill for cloNIDine (CATAPRES) 0.2 MG tablet. Pt states she is having headaches without this medication. Please f/u with pt

## 2014-06-21 ENCOUNTER — Ambulatory Visit: Payer: Self-pay | Attending: Internal Medicine | Admitting: Internal Medicine

## 2014-06-21 ENCOUNTER — Encounter: Payer: Self-pay | Admitting: Internal Medicine

## 2014-06-21 ENCOUNTER — Ambulatory Visit: Payer: Self-pay | Admitting: Internal Medicine

## 2014-06-21 VITALS — BP 129/87 | HR 73 | Temp 98.7°F | Resp 16 | Ht 64.0 in | Wt 274.0 lb

## 2014-06-21 DIAGNOSIS — E119 Type 2 diabetes mellitus without complications: Secondary | ICD-10-CM | POA: Insufficient documentation

## 2014-06-21 DIAGNOSIS — R609 Edema, unspecified: Secondary | ICD-10-CM | POA: Insufficient documentation

## 2014-06-21 DIAGNOSIS — I10 Essential (primary) hypertension: Secondary | ICD-10-CM | POA: Insufficient documentation

## 2014-06-21 LAB — POCT GLYCOSYLATED HEMOGLOBIN (HGB A1C): Hemoglobin A1C: 5.7

## 2014-06-21 LAB — GLUCOSE, POCT (MANUAL RESULT ENTRY): POC GLUCOSE: 117 mg/dL — AB (ref 70–99)

## 2014-06-21 NOTE — Progress Notes (Signed)
Pt is here following up on her diabetes and HTN. Pt injured her right foot and wants it to be checked out.
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
## 2014-06-21 NOTE — Patient Instructions (Signed)
Please increase your lasix to one pill twice per day for the next 5 days. You will return in 6-7 days for a follow up with RN for re-evaluation and blood work. It is very important that you have your potassium checked at this time.

## 2014-06-29 ENCOUNTER — Ambulatory Visit: Payer: Self-pay | Attending: Internal Medicine

## 2014-06-29 ENCOUNTER — Ambulatory Visit: Payer: Self-pay

## 2014-06-29 DIAGNOSIS — R609 Edema, unspecified: Secondary | ICD-10-CM

## 2014-06-29 LAB — BASIC METABOLIC PANEL
BUN: 11 mg/dL (ref 6–23)
CALCIUM: 8.8 mg/dL (ref 8.4–10.5)
CO2: 26 meq/L (ref 19–32)
Chloride: 104 mEq/L (ref 96–112)
Creat: 0.68 mg/dL (ref 0.50–1.10)
GLUCOSE: 100 mg/dL — AB (ref 70–99)
Potassium: 4.1 mEq/L (ref 3.5–5.3)
SODIUM: 140 meq/L (ref 135–145)

## 2014-06-29 NOTE — Progress Notes (Unsigned)
Patient ID: Sabrina [name], female   DOB: October 02, 1960, 54 y.o.   MRN: [id number]  Pt came in today for blood work today. While waiting in lobby, Environmental education officer noticed pt tired and weak. Pt is homeless living in her car. States she didn't sleep well.swelling noted more in the left lower leg. Denies Sob or chest pain. States she is complaint with taking all medications daily BP- 128/87 73 Pt given warm blankets to rest before catching her bus

## 2014-06-30 ENCOUNTER — Telehealth: Payer: Self-pay | Admitting: *Deleted

## 2014-06-30 NOTE — Telephone Encounter (Signed)
-----   Message from Lance Bosch, NP sent at 06/29/2014 11:12 PM EDT ----- Potassium and kidney function is normal

## 2014-06-30 NOTE — Telephone Encounter (Signed)
Left VM to return my call

## 2014-07-14 ENCOUNTER — Encounter (HOSPITAL_COMMUNITY): Payer: Self-pay | Admitting: Emergency Medicine

## 2014-07-14 ENCOUNTER — Emergency Department (HOSPITAL_COMMUNITY): Payer: Self-pay

## 2014-07-14 ENCOUNTER — Emergency Department (HOSPITAL_COMMUNITY)
Admission: EM | Admit: 2014-07-14 | Discharge: 2014-07-14 | Disposition: A | Discharge status: Home / Self Care | Payer: Self-pay | Attending: Emergency Medicine | Admitting: Emergency Medicine

## 2014-07-14 DIAGNOSIS — F329 Major depressive disorder, single episode, unspecified: Secondary | ICD-10-CM | POA: Insufficient documentation

## 2014-07-14 DIAGNOSIS — W010XXA Fall on same level from slipping, tripping and stumbling without subsequent striking against object, initial encounter: Secondary | ICD-10-CM | POA: Insufficient documentation

## 2014-07-14 DIAGNOSIS — S6991XA Unspecified injury of right wrist, hand and finger(s), initial encounter: Secondary | ICD-10-CM | POA: Insufficient documentation

## 2014-07-14 DIAGNOSIS — Y9389 Activity, other specified: Secondary | ICD-10-CM | POA: Insufficient documentation

## 2014-07-14 DIAGNOSIS — M1990 Unspecified osteoarthritis, unspecified site: Secondary | ICD-10-CM | POA: Insufficient documentation

## 2014-07-14 DIAGNOSIS — Z880 Allergy status to penicillin: Secondary | ICD-10-CM | POA: Insufficient documentation

## 2014-07-14 DIAGNOSIS — E119 Type 2 diabetes mellitus without complications: Secondary | ICD-10-CM | POA: Insufficient documentation

## 2014-07-14 DIAGNOSIS — Y998 Other external cause status: Secondary | ICD-10-CM | POA: Insufficient documentation

## 2014-07-14 DIAGNOSIS — K219 Gastro-esophageal reflux disease without esophagitis: Secondary | ICD-10-CM | POA: Insufficient documentation

## 2014-07-14 DIAGNOSIS — Z79899 Other long term (current) drug therapy: Secondary | ICD-10-CM | POA: Insufficient documentation

## 2014-07-14 DIAGNOSIS — Y92091 Bathroom in other non-institutional residence as the place of occurrence of the external cause: Secondary | ICD-10-CM | POA: Insufficient documentation

## 2014-07-14 DIAGNOSIS — I10 Essential (primary) hypertension: Secondary | ICD-10-CM | POA: Insufficient documentation

## 2014-07-14 DIAGNOSIS — W19XXXA Unspecified fall, initial encounter: Secondary | ICD-10-CM

## 2014-07-14 DIAGNOSIS — M109 Gout, unspecified: Secondary | ICD-10-CM | POA: Insufficient documentation

## 2014-07-14 MED ORDER — HYDROCODONE-ACETAMINOPHEN 5-325 MG PO TABS
2.0000 | ORAL_TABLET | Freq: Once | ORAL | Status: AC
  Administered 2014-07-14: 2 via ORAL
  Filled 2014-07-14: qty 2

## 2014-07-14 MED ORDER — NAPROXEN 500 MG PO TABS: 500.0000 mg | ORAL_TABLET | Freq: Two times a day (BID) | ORAL | Status: DC

## 2014-07-14 MED ORDER — ACETAMINOPHEN 325 MG PO TABS: 650.0000 mg | ORAL_TABLET | Freq: Once | ORAL | Status: DC

## 2014-07-14 MED ORDER — HYDROCODONE-ACETAMINOPHEN 5-325 MG PO TABS: 2.0000 | ORAL_TABLET | ORAL | Status: DC | PRN

## 2014-07-14 NOTE — ED Notes (Signed)
[facility] presents with a 54 yo female, client at [facility] when pt was coming out of bathroom slipped and fell on unknown liquid on floor.  Pt got up with assistance from floor and walked approximately 15 feet to chair.  Pt has swelling in lower extremities from HTN but pt feels like swelling in right knee is somewhat more than usual.  Pt c/o of right knee and right hand pain from fall.  Rates pain at 8/10 on 0 to 10 pain scale. No LOC, No neck/back pain, No obvious deformities and can stand and pivot per [facility].  Pt hypertensive but stable upon arrival.

## 2014-07-14 NOTE — Discharge Instructions (Signed)
Fall Prevention and Home Safety Follow up with your primary care provider. Take naproxen and hydrocodone for pain.  Falls cause injuries and can affect all age groups. It is possible to prevent falls.  HOW TO PREVENT FALLS  Wear shoes with rubber soles that do not have an opening for your toes.  Keep the inside and outside of your house well lit.  Use night lights throughout your home.  Remove clutter from floors.  Clean up floor spills.  Remove throw rugs or fasten them to the floor with carpet tape.  Do not place electrical cords across pathways.  Put grab bars by your tub, shower, and toilet. Do not use towel bars as grab bars.  Put handrails on both sides of the stairway. Fix loose handrails.  Do not climb on stools or stepladders, if possible.  Do not wax your floors.  Repair uneven or unsafe sidewalks, walkways, or stairs.  Keep items you use a lot within reach.  Be aware of pets.  Keep emergency numbers next to the telephone.  Put smoke detectors in your home and near bedrooms. Ask your doctor what other things you can do to prevent falls. Document Released: 01/11/2009 Document Revised: 09/16/2011 Document Reviewed: 06/17/2011 [facility] Patient Information 2015 [place], [place]. This information is not intended to replace advice given to you by your health care provider. Make sure you discuss any questions you have with your health care provider.
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
## 2014-07-14 NOTE — ED Notes (Signed)
Bed: WA21 Expected date:  Expected time:  Means of arrival:  Comments: EMS- slip and fall, knee pain

## 2014-07-18 ENCOUNTER — Ambulatory Visit: Payer: Self-pay | Admitting: Internal Medicine

## 2014-07-18 ENCOUNTER — Telehealth: Payer: Self-pay | Admitting: Internal Medicine

## 2014-07-18 MED ORDER — HYDROXYZINE PAMOATE 25 MG PO CAPS: 25.0000 mg | ORAL_CAPSULE | Freq: Every day | ORAL | Status: DC

## 2014-07-18 NOTE — Telephone Encounter (Signed)
Pt missed her appointment. Pt made another appointment next week. I refilled her medication.

## 2014-07-18 NOTE — Telephone Encounter (Signed)
Patient is requesting a med refill for hydrOXYzine (VISTARIL) 25 MG capsule. Please f/u with pt.

## 2014-07-25 ENCOUNTER — Ambulatory Visit: Payer: Self-pay | Admitting: Internal Medicine

## 2014-08-07 ENCOUNTER — Encounter: Payer: Self-pay | Admitting: Internal Medicine

## 2014-08-07 ENCOUNTER — Ambulatory Visit: Payer: Self-pay | Attending: Internal Medicine | Admitting: Internal Medicine

## 2014-08-07 VITALS — BP 128/75 | HR 68 | Temp 98.6°F | Resp 16 | Ht 64.0 in | Wt 275.0 lb

## 2014-08-07 DIAGNOSIS — E669 Obesity, unspecified: Secondary | ICD-10-CM | POA: Insufficient documentation

## 2014-08-07 DIAGNOSIS — I10 Essential (primary) hypertension: Secondary | ICD-10-CM | POA: Insufficient documentation

## 2014-08-07 DIAGNOSIS — R600 Localized edema: Secondary | ICD-10-CM

## 2014-08-07 DIAGNOSIS — F319 Bipolar disorder, unspecified: Secondary | ICD-10-CM | POA: Insufficient documentation

## 2014-08-07 DIAGNOSIS — Z6841 Body Mass Index (BMI) 40.0 and over, adult: Secondary | ICD-10-CM | POA: Insufficient documentation

## 2014-08-07 DIAGNOSIS — Z590 Homelessness: Secondary | ICD-10-CM | POA: Insufficient documentation

## 2014-08-07 DIAGNOSIS — E119 Type 2 diabetes mellitus without complications: Secondary | ICD-10-CM

## 2014-08-07 DIAGNOSIS — Z79899 Other long term (current) drug therapy: Secondary | ICD-10-CM

## 2014-08-07 DIAGNOSIS — K219 Gastro-esophageal reflux disease without esophagitis: Secondary | ICD-10-CM | POA: Insufficient documentation

## 2014-08-07 DIAGNOSIS — Z791 Long term (current) use of non-steroidal anti-inflammatories (NSAID): Secondary | ICD-10-CM | POA: Insufficient documentation

## 2014-08-07 LAB — POCT URINALYSIS DIPSTICK
Bilirubin, UA: NEGATIVE
Blood, UA: NEGATIVE
GLUCOSE UA: NEGATIVE
Ketones, UA: NEGATIVE
LEUKOCYTES UA: NEGATIVE
Nitrite, UA: NEGATIVE
SPEC GRAV UA: 1.025
UROBILINOGEN UA: 0.2
pH, UA: 5.5

## 2014-08-07 LAB — GLUCOSE, POCT (MANUAL RESULT ENTRY): POC GLUCOSE: 112 mg/dL — AB (ref 70–99)
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
## 2014-08-07 NOTE — Progress Notes (Signed)
Pt is here today for med management.  Pt is c/o urinary frequency.

## 2014-08-08 LAB — BASIC METABOLIC PANEL
BUN: 13 mg/dL (ref 6–23)
CHLORIDE: 105 meq/L (ref 96–112)
CO2: 26 mEq/L (ref 19–32)
CREATININE: 0.88 mg/dL (ref 0.50–1.10)
Calcium: 9.1 mg/dL (ref 8.4–10.5)
Glucose, Bld: 90 mg/dL (ref 70–99)
POTASSIUM: 4.5 meq/L (ref 3.5–5.3)
Sodium: 141 mEq/L (ref 135–145)

## 2014-08-09 ENCOUNTER — Telehealth: Payer: Self-pay | Admitting: *Deleted

## 2014-08-09 NOTE — Telephone Encounter (Signed)
-----   Message from Lance Bosch, NP sent at 08/08/2014  7:45 PM EDT ----- Potassium level is normal

## 2014-08-09 NOTE — Telephone Encounter (Signed)
Left VM for pt to return my call  

## 2014-08-14 ENCOUNTER — Encounter (HOSPITAL_COMMUNITY)
Admission: RE | Admit: 2014-08-14 | Discharge: 2014-08-14 | Disposition: A | Discharge status: Home / Self Care | Payer: Self-pay | Source: Ambulatory Visit | Attending: Obstetrics & Gynecology | Admitting: Obstetrics & Gynecology

## 2014-08-14 DIAGNOSIS — Z01812 Encounter for preprocedural laboratory examination: Secondary | ICD-10-CM | POA: Insufficient documentation

## 2014-08-14 LAB — BASIC METABOLIC PANEL WITH GFR
Anion gap: 9 (ref 5–15)
BUN: 12 mg/dL (ref 6–20)
CO2: 26 mmol/L (ref 22–32)
Calcium: 9 mg/dL (ref 8.9–10.3)
Chloride: 103 mmol/L (ref 101–111)
Creatinine, Ser: 0.73 mg/dL (ref 0.44–1.00)
GFR calc Af Amer: >60 mL/min
GFR calc non Af Amer: >60 mL/min
Glucose, Bld: 104 mg/dL — ABNORMAL HIGH (ref 65–99)
Potassium: 3.7 mmol/L (ref 3.5–5.1)
Sodium: 138 mmol/L (ref 135–145)

## 2014-08-14 LAB — CBC
HCT: 35.8 % — ABNORMAL LOW (ref 36.0–46.0)
Hemoglobin: 12.2 g/dL (ref 12.0–15.0)
MCH: 32.1 pg (ref 26.0–34.0)
MCHC: 34.1 g/dL (ref 30.0–36.0)
MCV: 94.2 fL (ref 78.0–100.0)
Platelets: 314 10*3/uL (ref 150–400)
RBC: 3.8 MIL/uL — ABNORMAL LOW (ref 3.87–5.11)
RDW: 14.5 % (ref 11.5–15.5)
WBC: 7.8 10*3/uL (ref 4.0–10.5)

## 2014-08-14 NOTE — Progress Notes (Signed)
PT WAS FALLING ASLEEP DURING PRE-OP VISIT.. THE PT WAS ASKED IF SHE WAS OK AND SHE STATED" I DIDN'T EAT A LOT TODAY" PT ALERT WHEN TALKING TO HER DIRECTLY AND VOICED SHE UNDERSTOOD HER INSTRUCTIONS. PT STATED "I WILL WORK ON GETTING SOMEONE TO DRIVE ME HOME AFTER MY SURGERY". [place] (IN THE CLINIC) WAS NOTIFIED.

## 2014-08-14 NOTE — Patient Instructions (Addendum)
   Your procedure is scheduled on: MAY 26 AT 145PM  Enter through the Main Entrance of [facility] at: 1215PM  Pick up the phone at the desk and dial [phone] and inform us of your arrival.  Please call this number if you have any problems the morning of surgery: [phone]  Remember: Do not eat food after midnight: 5AM DAY OF SURGERY (SMALL BREAKFAST)   Do not drink clear liquids after:930AM DAY OF SURGERY   Take these medicines the morning of surgery with a SIP OF WATER: DO NOT TAKE METFORMIN DAY OF SURGERY.Marland Kitchen   TAKE ATENOLOL AND CLONDINE DAY OF SURGERY  Do not wear jewelry, make-up, or FINGER nail polish No metal in your hair or on your body. Do not wear lotions, powders, perfumes.  You may wear deodorant.  Do not bring valuables to the hospital. Contacts, dentures or bridgework may not be worn into surgery.  Leave suitcase in the car. After Surgery it may be brought to your room. For patients being admitted to the hospital, checkout time is 11:00am the day of discharge.    Patients discharged on the day of surgery will not be allowed to drive home.

## 2014-08-18 ENCOUNTER — Other Ambulatory Visit: Payer: Self-pay | Admitting: *Deleted

## 2014-08-18 DIAGNOSIS — I10 Essential (primary) hypertension: Secondary | ICD-10-CM

## 2014-08-18 MED ORDER — FUROSEMIDE 20 MG PO TABS: 20.0000 mg | ORAL_TABLET | Freq: Every day | ORAL | Status: DC

## 2014-08-22 NOTE — Anesthesia Preprocedure Evaluation (Addendum)
Anesthesia Evaluation  Patient identified by MRN, date of birth, ID band Patient awake    Reviewed: Allergy & Precautions, NPO status , Patient's Chart, lab work & pertinent test results, reviewed documented beta blocker date and time   Airway Mallampati: III   Neck ROM: Full    Dental  (+) Partial Upper, Dental Advisory Given, Teeth Intact   Pulmonary COPD breath sounds clear to auscultation        Cardiovascular hypertension, Pt. on medications Rhythm:Regular  ECHO 2015 EF 60%, EKG LVH   Neuro/Psych Depression Bipolar Disorder    GI/Hepatic Neg liver ROS, GERD-  Medicated,  Endo/Other  diabetes, Type 2, Oral Hypoglycemic AgentsMorbid obesity  Renal/GU negative Renal ROS     Musculoskeletal   Abdominal (+) + obese,   Peds  Hematology   Anesthesia Other Findings   Reproductive/Obstetrics                            Anesthesia Physical Anesthesia Plan  ASA: III  Anesthesia Plan: General   Post-op Pain Management:    Induction: Intravenous and Rapid sequence  Airway Management Planned: Oral ETT and Video Laryngoscope Planned  Additional Equipment:   Intra-op Plan:   Post-operative Plan: Extubation in OR  Informed Consent: I have reviewed the patients History and Physical, chart, labs and discussed the procedure including the risks, benefits and alternatives for the proposed anesthesia with the patient or authorized representative who has indicated his/her understanding and acceptance.     Plan Discussed with:   Anesthesia Plan Comments: (Glide available, modified rapid sequence)        Anesthesia Quick Evaluation

## 2014-08-24 ENCOUNTER — Encounter (HOSPITAL_COMMUNITY)
Admission: RE | Disposition: A | Discharge status: Home / Self Care | Payer: Self-pay | Source: Ambulatory Visit | Attending: Obstetrics & Gynecology

## 2014-08-24 ENCOUNTER — Ambulatory Visit (HOSPITAL_COMMUNITY): Payer: Self-pay | Admitting: Anesthesiology

## 2014-08-24 ENCOUNTER — Observation Stay (HOSPITAL_COMMUNITY)
Admission: RE | Admit: 2014-08-24 | Discharge: 2014-08-25 | Disposition: A | Discharge status: Home / Self Care | Payer: Self-pay | Source: Ambulatory Visit | Attending: Obstetrics & Gynecology | Admitting: Obstetrics & Gynecology

## 2014-08-24 ENCOUNTER — Encounter (HOSPITAL_COMMUNITY): Payer: Self-pay | Admitting: Anesthesiology

## 2014-08-24 DIAGNOSIS — R001 Bradycardia, unspecified: Secondary | ICD-10-CM | POA: Insufficient documentation

## 2014-08-24 DIAGNOSIS — M109 Gout, unspecified: Secondary | ICD-10-CM | POA: Insufficient documentation

## 2014-08-24 DIAGNOSIS — R079 Chest pain, unspecified: Secondary | ICD-10-CM | POA: Diagnosis not present

## 2014-08-24 DIAGNOSIS — R0789 Other chest pain: Secondary | ICD-10-CM | POA: Insufficient documentation

## 2014-08-24 DIAGNOSIS — D280 Benign neoplasm of vulva: Secondary | ICD-10-CM

## 2014-08-24 DIAGNOSIS — M069 Rheumatoid arthritis, unspecified: Secondary | ICD-10-CM | POA: Insufficient documentation

## 2014-08-24 DIAGNOSIS — Z888 Allergy status to other drugs, medicaments and biological substances status: Secondary | ICD-10-CM | POA: Insufficient documentation

## 2014-08-24 DIAGNOSIS — F319 Bipolar disorder, unspecified: Secondary | ICD-10-CM | POA: Insufficient documentation

## 2014-08-24 DIAGNOSIS — Z885 Allergy status to narcotic agent status: Secondary | ICD-10-CM | POA: Insufficient documentation

## 2014-08-24 DIAGNOSIS — N9489 Other specified conditions associated with female genital organs and menstrual cycle: Secondary | ICD-10-CM

## 2014-08-24 DIAGNOSIS — J449 Chronic obstructive pulmonary disease, unspecified: Secondary | ICD-10-CM | POA: Insufficient documentation

## 2014-08-24 DIAGNOSIS — E119 Type 2 diabetes mellitus without complications: Secondary | ICD-10-CM

## 2014-08-24 DIAGNOSIS — I129 Hypertensive chronic kidney disease with stage 1 through stage 4 chronic kidney disease, or unspecified chronic kidney disease: Secondary | ICD-10-CM | POA: Insufficient documentation

## 2014-08-24 DIAGNOSIS — N189 Chronic kidney disease, unspecified: Secondary | ICD-10-CM

## 2014-08-24 DIAGNOSIS — K219 Gastro-esophageal reflux disease without esophagitis: Secondary | ICD-10-CM | POA: Insufficient documentation

## 2014-08-24 DIAGNOSIS — E785 Hyperlipidemia, unspecified: Secondary | ICD-10-CM | POA: Insufficient documentation

## 2014-08-24 DIAGNOSIS — Z880 Allergy status to penicillin: Secondary | ICD-10-CM | POA: Insufficient documentation

## 2014-08-24 DIAGNOSIS — N9089 Other specified noninflammatory disorders of vulva and perineum: Secondary | ICD-10-CM

## 2014-08-24 DIAGNOSIS — Z6841 Body Mass Index (BMI) 40.0 and over, adult: Secondary | ICD-10-CM | POA: Insufficient documentation

## 2014-08-24 HISTORY — PX: LESION REMOVAL: SHX5196

## 2014-08-24 LAB — GLUCOSE, CAPILLARY
Glucose-Capillary: 91 mg/dL (ref 65–99)
Glucose-Capillary: 72 mg/dL (ref 65–99)
Glucose-Capillary: 112 mg/dL — ABNORMAL HIGH (ref 65–99)

## 2014-08-24 LAB — TROPONIN I: Troponin I: <0.03 ng/mL (ref <0.031)

## 2014-08-24 LAB — MRSA PCR SCREENING: MRSA by PCR: POSITIVE — AB

## 2014-08-24 SURGERY — EXCISION, LESION, VAGINA
Anesthesia: General | Site: Vagina | Laterality: Left

## 2014-08-24 MED ORDER — SCOPOLAMINE 1 MG/3DAYS TD PT72: 1.0000 | MEDICATED_PATCH | Freq: Once | TRANSDERMAL | Status: DC

## 2014-08-24 MED ORDER — FUROSEMIDE 20 MG PO TABS
20.0000 mg | ORAL_TABLET | Freq: Every day | ORAL | Status: DC
  Administered 2014-08-25: 20 mg via ORAL
  Filled 2014-08-24 (×2): qty 1

## 2014-08-24 MED ORDER — MORPHINE SULFATE 4 MG/ML IJ SOLN
2.0000 mg | INTRAMUSCULAR | Status: DC | PRN
  Administered 2014-08-24 (×4): 2 mg via INTRAVENOUS

## 2014-08-24 MED ORDER — METFORMIN HCL 850 MG PO TABS
850.0000 mg | ORAL_TABLET | Freq: Every day | ORAL | Status: DC
Start: 2014-08-25 — End: 2014-08-25
  Administered 2014-08-25: 850 mg via ORAL
  Filled 2014-08-24 (×2): qty 1

## 2014-08-24 MED ORDER — LIDOCAINE HCL (CARDIAC) 20 MG/ML IV SOLN
INTRAVENOUS | Status: AC
  Filled 2014-08-24: qty 5

## 2014-08-24 MED ORDER — FENTANYL CITRATE (PF) 100 MCG/2ML IJ SOLN: 25.0000 ug | INTRAMUSCULAR | Status: DC | PRN

## 2014-08-24 MED ORDER — HYDROXYZINE HCL 25 MG PO TABS
25.0000 mg | ORAL_TABLET | Freq: Four times a day (QID) | ORAL | Status: DC | PRN
  Filled 2014-08-24: qty 1

## 2014-08-24 MED ORDER — TRAZODONE 25 MG HALF TABLET
25.0000 mg | ORAL_TABLET | Freq: Every day | ORAL | Status: DC | PRN
  Filled 2014-08-24: qty 1

## 2014-08-24 MED ORDER — PROPOFOL 10 MG/ML IV BOLUS
INTRAVENOUS | Status: AC
  Filled 2014-08-24: qty 20

## 2014-08-24 MED ORDER — MORPHINE SULFATE 4 MG/ML IJ SOLN
INTRAMUSCULAR | Status: AC
  Filled 2014-08-24: qty 1

## 2014-08-24 MED ORDER — LIDOCAINE HCL (CARDIAC) 20 MG/ML IV SOLN
INTRAVENOUS | Status: DC | PRN
  Administered 2014-08-24: 100 mg via INTRAVENOUS

## 2014-08-24 MED ORDER — HYDROMORPHONE HCL 1 MG/ML IJ SOLN
INTRAMUSCULAR | Status: DC | PRN
  Administered 2014-08-24: .1 mg via INTRAVENOUS
  Administered 2014-08-24: .3 mg via INTRAVENOUS
  Administered 2014-08-24: .2 mg via INTRAVENOUS
  Administered 2014-08-24: .1 mg via INTRAVENOUS

## 2014-08-24 MED ORDER — ONDANSETRON HCL 4 MG/2ML IJ SOLN
INTRAMUSCULAR | Status: AC
  Filled 2014-08-24: qty 2

## 2014-08-24 MED ORDER — FENTANYL CITRATE (PF) 250 MCG/5ML IJ SOLN
INTRAMUSCULAR | Status: AC
  Filled 2014-08-24: qty 5

## 2014-08-24 MED ORDER — BUPIVACAINE-EPINEPHRINE (PF) 0.5% -1:200000 IJ SOLN
INTRAMUSCULAR | Status: AC
  Filled 2014-08-24: qty 30

## 2014-08-24 MED ORDER — PANTOPRAZOLE SODIUM 40 MG PO TBEC
40.0000 mg | DELAYED_RELEASE_TABLET | Freq: Every day | ORAL | Status: DC
  Administered 2014-08-25: 40 mg via ORAL
  Filled 2014-08-24: qty 1

## 2014-08-24 MED ORDER — DEXAMETHASONE SODIUM PHOSPHATE 4 MG/ML IJ SOLN
INTRAMUSCULAR | Status: AC
  Filled 2014-08-24: qty 1

## 2014-08-24 MED ORDER — ONDANSETRON HCL 4 MG/2ML IJ SOLN
INTRAMUSCULAR | Status: AC
Start: 2014-08-24 — End: 2014-08-24
  Filled 2014-08-24: qty 2

## 2014-08-24 MED ORDER — HYDRALAZINE HCL 20 MG/ML IJ SOLN
INTRAMUSCULAR | Status: DC | PRN
  Administered 2014-08-24 (×2): 10 mg via INTRAVENOUS

## 2014-08-24 MED ORDER — MORPHINE SULFATE 4 MG/ML IJ SOLN
INTRAMUSCULAR | Status: AC
  Administered 2014-08-24: 2 mg
  Filled 2014-08-24: qty 1

## 2014-08-24 MED ORDER — PROPOFOL 10 MG/ML IV BOLUS
INTRAVENOUS | Status: DC | PRN
  Administered 2014-08-24: 200 mg via INTRAVENOUS

## 2014-08-24 MED ORDER — MIDAZOLAM HCL 2 MG/2ML IJ SOLN
INTRAMUSCULAR | Status: AC
  Filled 2014-08-24: qty 2

## 2014-08-24 MED ORDER — FENTANYL CITRATE (PF) 100 MCG/2ML IJ SOLN
INTRAMUSCULAR | Status: DC | PRN
Start: 2014-08-24 — End: 2014-08-24
  Administered 2014-08-24: 25 ug via INTRAVENOUS

## 2014-08-24 MED ORDER — HYDROXYZINE HCL 25 MG PO TABS
25.0000 mg | ORAL_TABLET | Freq: Every day | ORAL | Status: DC
Start: 2014-08-24 — End: 2014-08-24
  Filled 2014-08-24: qty 1

## 2014-08-24 MED ORDER — LACTATED RINGERS IV SOLN
INTRAVENOUS | Status: DC
  Administered 2014-08-24 (×3): via INTRAVENOUS

## 2014-08-24 MED ORDER — MIDAZOLAM HCL 2 MG/2ML IJ SOLN
INTRAMUSCULAR | Status: DC | PRN
  Administered 2014-08-24: 1 mg via INTRAVENOUS

## 2014-08-24 MED ORDER — MEPERIDINE HCL 25 MG/ML IJ SOLN: 6.2500 mg | INTRAMUSCULAR | Status: DC | PRN

## 2014-08-24 MED ORDER — IBUPROFEN 600 MG PO TABS: 600.0000 mg | ORAL_TABLET | Freq: Four times a day (QID) | ORAL | Status: DC | PRN

## 2014-08-24 MED ORDER — ONDANSETRON HCL 4 MG/2ML IJ SOLN
INTRAMUSCULAR | Status: DC | PRN
  Administered 2014-08-24: 4 mg via INTRAVENOUS

## 2014-08-24 MED ORDER — ATENOLOL 25 MG PO TABS
50.0000 mg | ORAL_TABLET | Freq: Every day | ORAL | Status: DC
  Filled 2014-08-24 (×2): qty 1

## 2014-08-24 MED ORDER — LACTATED RINGERS IV SOLN: INTRAVENOUS | Status: DC

## 2014-08-24 MED ORDER — NITROGLYCERIN 0.4 MG SL SUBL
0.4000 mg | SUBLINGUAL_TABLET | SUBLINGUAL | Status: AC | PRN
  Administered 2014-08-24 (×3): 0.4 mg via SUBLINGUAL

## 2014-08-24 MED ORDER — NAPROXEN 500 MG PO TABS
500.0000 mg | ORAL_TABLET | Freq: Two times a day (BID) | ORAL | Status: DC
  Administered 2014-08-25 (×2): 500 mg via ORAL
  Filled 2014-08-24 (×4): qty 1

## 2014-08-24 MED ORDER — HYDROMORPHONE HCL 1 MG/ML IJ SOLN
INTRAMUSCULAR | Status: AC
  Filled 2014-08-24: qty 1

## 2014-08-24 MED ORDER — ROCURONIUM BROMIDE 100 MG/10ML IV SOLN
INTRAVENOUS | Status: AC
  Filled 2014-08-24: qty 1

## 2014-08-24 MED ORDER — NITROGLYCERIN 0.4 MG SL SUBL
SUBLINGUAL_TABLET | SUBLINGUAL | Status: AC
  Filled 2014-08-24: qty 1

## 2014-08-24 MED ORDER — ATENOLOL 50 MG PO TABS
50.0000 mg | ORAL_TABLET | Freq: Every day | ORAL | Status: DC
  Administered 2014-08-25: 50 mg via ORAL
  Filled 2014-08-24 (×2): qty 1

## 2014-08-24 MED ORDER — PROMETHAZINE HCL 25 MG/ML IJ SOLN
6.2500 mg | INTRAMUSCULAR | Status: DC | PRN
  Administered 2014-08-24: 12.5 mg via INTRAVENOUS

## 2014-08-24 MED ORDER — MORPHINE SULFATE 4 MG/ML IJ SOLN
4.0000 mg | Freq: Once | INTRAMUSCULAR | Status: AC
  Administered 2014-08-24: 2 mg via INTRAVENOUS

## 2014-08-24 MED ORDER — DIVALPROEX SODIUM ER 500 MG PO TB24
1000.0000 mg | ORAL_TABLET | Freq: Every day | ORAL | Status: DC
  Administered 2014-08-24: 1000 mg via ORAL
  Filled 2014-08-24 (×2): qty 2

## 2014-08-24 MED ORDER — BUPIVACAINE HCL (PF) 0.5 % IJ SOLN
INTRAMUSCULAR | Status: DC | PRN
  Administered 2014-08-24: 8 mL

## 2014-08-24 MED ORDER — PRENATAL MULTIVITAMIN CH
1.0000 | ORAL_TABLET | Freq: Every day | ORAL | Status: DC
  Administered 2014-08-25: 1 via ORAL
  Filled 2014-08-24: qty 1

## 2014-08-24 MED ORDER — PROMETHAZINE HCL 25 MG/ML IJ SOLN
INTRAMUSCULAR | Status: AC
  Administered 2014-08-24: 12.5 mg via INTRAVENOUS
  Filled 2014-08-24: qty 1

## 2014-08-24 MED ORDER — BUPIVACAINE HCL (PF) 0.5 % IJ SOLN
INTRAMUSCULAR | Status: AC
  Filled 2014-08-24: qty 30

## 2014-08-24 MED ORDER — CLONIDINE HCL 0.2 MG PO TABS
0.2000 mg | ORAL_TABLET | Freq: Three times a day (TID) | ORAL | Status: DC
  Administered 2014-08-24 – 2014-08-25 (×3): 0.2 mg via ORAL
  Filled 2014-08-24 (×5): qty 1

## 2014-08-24 SURGICAL SUPPLY — 13 items
BLADE SURG 15 STRL LF C SS BP (BLADE) ×1 IMPLANT
BLADE SURG 15 STRL SS (BLADE) ×2
CLOTH BEACON ORANGE TIMEOUT ST (SAFETY) ×2 IMPLANT
CONTAINER PREFILL 10% NBF 60ML (FORM) ×1 IMPLANT
GLOVE BIO SURGEON STRL SZ 6.5 (GLOVE) ×2 IMPLANT
GOWN STRL REUS W/TWL LRG LVL3 (GOWN DISPOSABLE) ×4 IMPLANT
PACK VAGINAL MINOR WOMEN LF (CUSTOM PROCEDURE TRAY) ×2 IMPLANT
PAD PREP 24X48 CUFFED NSTRL (MISCELLANEOUS) ×2 IMPLANT
SUT CHROMIC 3 0 CT 36 (SUTURE) ×1 IMPLANT
SUT CHROMIC 3 0 SH 27 (SUTURE) IMPLANT
SUT VIC AB 4-0 PS2 27 (SUTURE) ×1 IMPLANT
TOWEL OR 17X24 6PK STRL BLUE (TOWEL DISPOSABLE) ×4 IMPLANT
WATER STERILE IRR 1000ML POUR (IV SOLUTION) ×2 IMPLANT
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
## 2014-08-24 NOTE — Anesthesia Procedure Notes (Signed)
Procedure Name: LMA Insertion Date/Time: 08/24/2014 2:19 PM Performed by: Flossie Dibble Pre-anesthesia Checklist: Patient identified, Timeout performed, Emergency Drugs available, Patient being monitored and Suction available Patient Re-evaluated:Patient Re-evaluated prior to inductionOxygen Delivery Method: Circle system utilized Preoxygenation: Pre-oxygenation with 100% oxygen Intubation Type: IV induction LMA: LMA with gastric port inserted LMA Size: 4.0 Number of attempts: 1 Placement Confirmation: breath sounds checked- equal and bilateral and positive ETCO2 Tube secured with: Tape Dental Injury: Teeth and Oropharynx as per pre-operative assessment

## 2014-08-24 NOTE — Anesthesia Postprocedure Evaluation (Addendum)
  Anesthesia Post-op Note  Patient: Sabrina [name]  Procedure(s) Performed: Procedure(s): EXCISION VAGINAL LESION (Left)  Patient Location: PACU  Anesthesia Type:General  Level of Consciousness: awake  Airway and Oxygen Therapy: Patient Spontanous Breathing  Post-op Pain: mild  Post-op Assessment: Post-op Vital signs reviewed  Post-op Vital Signs: Reviewed and stable  Last Vitals:  Filed Vitals:   08/24/14 1519  BP:   Temp: 36.4 C  Resp:     Complications: No apparent anesthesia complications. After about 45 min in PACU the patient started complaining of substernal chest pain non-radiating, 8/10. 12 lead EKG obtained which shows inverted T waves in II,III, aVF, V2-V5 which are new changes from 12 lead EKG done in 05/2014. She was given NTG SL without relief. She was also given Morphine SO4 with slight improvement of her pain. Troponins were ordered and drawn. Discussed with Dr. Roselie Awkward, Cardiology consult requested.

## 2014-08-24 NOTE — Op Note (Signed)
Procedure: Resection of left labial mass Preoperative diagnosis: Solid mass on the left labium minor Postoperative diagnosis: Same Surgeon: Dr. Emeterio Reeve Anesthesia: Gen. with LMA and local anesthetic Specimen: Mass from left labium Estimated blood loss: Less than 10 mL Drains: None Complications: None Counts correct  Patient gave written consent for resection of a left labial mass that had been in place for about 2 years. It was not tender but was solid and measured about 2.5 x 2 cm. Patient identification was confirmed and she was brought to the operating room and placed in dorsal lithotomy position. Gen. anesthesia with LMA was induced. The perineum and vagina were sterilely prepped and draped. Previous described labial mass was identified. Percent Marcaine with 1 200,000 epinephrine was infiltrated around the mass. #13 blade was used to incise the skin. The mass was quite adherent to the skin. Made a elliptical incision and the knife dissected the mass and removed it. Skin was trimmed so that no portions of the lesion were remaining. The deep layer of the defect was closed with interrupted sutures with 3-0 chromic gut. Skin was closed with subcuticular suture with 4-0 Vicryl. Specimen was sent to pathology. There was minimal bleeding. Was no bleeding at the end of the procedure. She was brought in stable condition to PACU  Dr. Emeterio Reeve  08/24/2014  4:23 PM
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
## 2014-08-24 NOTE — Progress Notes (Signed)
Day of Surgery Procedure(s) (LRB): EXCISION VAGINAL LESION (Left)  Subjective: Patient reports nausea.  Chest discomfort similar to what she experiences with exertion  Objective: I have reviewed patient's vital signs and intake and output.  General: alert, cooperative and no distress Resp: clear to auscultation bilaterally  Assessment: s/p Procedure(s): EXCISION VAGINAL LESION (Left): Chest discomfort and EKG change   Plan: Cardiology consult and cardiac enzymes ordered, NTG and morphine now     Jaidin Ugarte 08/24/2014, 5:02 PM

## 2014-08-24 NOTE — Transfer of Care (Signed)
Immediate Anesthesia Transfer of Care Note  Patient: Ardean Larsen  Procedure(s) Performed: Procedure(s): EXCISION VAGINAL LESION (Left)  Patient Location: PACU  Anesthesia Type:General  Level of Consciousness: awake, alert  and oriented  Airway & Oxygen Therapy: Patient Spontanous Breathing and Patient connected to face mask oxygen  Post-op Assessment: Report given to RN and Post -op Vital signs reviewed and stable  Post vital signs: Reviewed and stable  Last Vitals:  Filed Vitals:   08/24/14 1519  BP:   Temp: 36.4 C  Resp:     Complications: No apparent anesthesia complications

## 2014-08-25 ENCOUNTER — Encounter (HOSPITAL_COMMUNITY): Payer: Self-pay | Admitting: Obstetrics & Gynecology

## 2014-08-25 DIAGNOSIS — N9089 Other specified noninflammatory disorders of vulva and perineum: Secondary | ICD-10-CM

## 2014-08-25 DIAGNOSIS — R079 Chest pain, unspecified: Secondary | ICD-10-CM

## 2014-08-25 DIAGNOSIS — R072 Precordial pain: Secondary | ICD-10-CM

## 2014-08-25 LAB — GLUCOSE, CAPILLARY
Glucose-Capillary: 86 mg/dL (ref 65–99)
Glucose-Capillary: 109 mg/dL — ABNORMAL HIGH (ref 65–99)

## 2014-08-25 LAB — TROPONIN I: Troponin I: <0.03 ng/mL (ref <0.031)

## 2014-08-25 MED ORDER — MUPIROCIN 2 % EX OINT
1.0000 "application " | TOPICAL_OINTMENT | Freq: Two times a day (BID) | CUTANEOUS | Status: DC
  Administered 2014-08-25: 1 via NASAL
  Filled 2014-08-25: qty 22

## 2014-08-25 MED ORDER — LIDOCAINE 5 % EX OINT
TOPICAL_OINTMENT | Freq: Four times a day (QID) | CUTANEOUS | Status: DC | PRN
  Filled 2014-08-25: qty 35.44

## 2014-08-25 MED ORDER — BENZOCAINE-MENTHOL 20-0.5 % EX AERO
1.0000 "application " | INHALATION_SPRAY | Freq: Four times a day (QID) | CUTANEOUS | Status: DC | PRN
  Administered 2014-08-25: 1 via TOPICAL
  Filled 2014-08-25: qty 56

## 2014-08-25 MED ORDER — CHLORHEXIDINE GLUCONATE CLOTH 2 % EX PADS
6.0000 | MEDICATED_PAD | Freq: Every day | CUTANEOUS | Status: DC
  Administered 2014-08-25: 6 via TOPICAL

## 2014-08-25 MED ORDER — MUPIROCIN 2 % EX OINT: 1.0000 "application " | TOPICAL_OINTMENT | Freq: Two times a day (BID) | CUTANEOUS | Status: DC

## 2014-08-25 MED ORDER — BENZOCAINE-MENTHOL 20-0.5 % EX AERO: 1.0000 "application " | INHALATION_SPRAY | Freq: Four times a day (QID) | CUTANEOUS | Status: DC | PRN

## 2014-08-25 MED ORDER — LIDOCAINE 5 % EX OINT: TOPICAL_OINTMENT | Freq: Four times a day (QID) | CUTANEOUS | Status: DC | PRN

## 2014-08-25 NOTE — Progress Notes (Signed)
CSW received call from [facility] requesting [place] visiting since patient is currently living in a homeless shelter and RN was concerned about access to care once patient is discharged.   CSW spent approximately one hour with patient in order to provide supportive listening.  Patient presented as easily engaged and receptive to the visit. She displayed a full range in affect.  Patient displayed a tangential thought process and required frequently re-direction and guidance since she was otherwise unable to have a linear and goal orientated thought process.  Patient openly processed and shared her numerous psychosocial stressors since childhood, including family relational stress, mental health concerns, grief and loss, unemployment, and homelessness.  She shared that she cannot understand why she continues to be homeless and unemployed since she is a "good person", and expressed frustration with family members and friends in life who are "not good people" but continue to have good luck and fortune.  She admits that she has previously (not recently) thought about wanting to "end it all", but she shared belief that there continues to be hope for her to have successes in life.  CSW assisted the patient to review personal strengths in order to help her to continue to remain motivated to put forth effort to secure independent housing and employment.  Patient presents as persistent and motivated to continue to apply for jobs.   Patient confirmed that she is currently staying at [facility] (homeless shelter) and is able to return at time of discharge.  Patient shared that she needs to call them today (5/27) in order to ensure that her bed will remain available to her.  Patient was calling shelter as CSW left patient's room.  Patient confirmed that she has access to transportation. She stated that at time of discharge her cousin can pick her up, and she shared that her cousin will ensure that she attends all follow up  appointments.   No additional CSW interventions needed at this time.  CSW updated RN with information.  Patient expressed appreciation for CSW's time since she found it helpful to have someone talk to about her story.    Contact CSW if additional needs arise.  Lucita Ferrara, [place] [facility] [phone]

## 2014-08-25 NOTE — Progress Notes (Signed)
Patient Profile: 54 y.o. obese black female with DM, HTN and HLD. No previous CAD. S/P fibroma resection from left vulva 08/24/14. Post operative course complicated by SSCP and labile BP.   Subjective: Feeling better. Still with intermittent atypical, sharp left sided chest pain. Non radiating. No dyspnea. Has a HA.   Objective: Vital signs in last 24 hours: Temp:  [97.6 F (36.4 C)-98.8 F (37.1 C)] 98.3 F (36.8 C) (05/27 0500) Pulse Rate:  [55-89] 55 (05/27 0742) Resp:  [13-29] 18 (05/27 0742) BP: (114-190)/(47-104) 118/65 mmHg (05/27 0700) SpO2:  [96 %-100 %] 100 % (05/27 0742) Weight:  [270 lb 4.8 oz (122.607 kg)] 270 lb 4.8 oz (122.607 kg) (05/26 2000) Last BM Date: 08/22/14  Intake/Output from previous day: 05/26 0701 - 05/27 0700 In: 2630 [P.O.:480; I.V.:2150] Out: 2150 [Urine:2150] Intake/Output this shift:    Medications Current Facility-Administered Medications  Medication Dose Route Frequency Provider Last Rate Last Dose  . atenolol (TENORMIN) tablet 50 mg  50 mg Oral Daily Woodroe Mode, MD      . Chlorhexidine Gluconate Cloth 2 % PADS 6 each  6 each Topical Q0600 Woodroe Mode, MD   6 each at 08/25/14 [phone]  . cloNIDine (CATAPRES) tablet 0.2 mg  0.2 mg Oral TID Woodroe Mode, MD   0.2 mg at 08/24/14 2143  . divalproex (DEPAKOTE ER) 24 hr tablet 1,000 mg  1,000 mg Oral QHS Woodroe Mode, MD   1,000 mg at 08/24/14 2143  . furosemide (LASIX) tablet 20 mg  20 mg Oral Daily Woodroe Mode, MD      . hydrOXYzine (ATARAX/VISTARIL) tablet 25 mg  25 mg Oral Q6H PRN Manya Silvas, CNM      . metFORMIN (GLUCOPHAGE) tablet 850 mg  850 mg Oral Q breakfast Woodroe Mode, MD      . mupirocin ointment (BACTROBAN) 2 % 1 application  1 application Nasal BID Woodroe Mode, MD      . naproxen (NAPROSYN) tablet 500 mg  500 mg Oral BID Woodroe Mode, MD   500 mg at 08/25/14 0515  . pantoprazole (PROTONIX) EC tablet 40 mg  40 mg Oral Daily Woodroe Mode, MD      . prenatal  multivitamin tablet 1 tablet  1 tablet Oral Q1200 Woodroe Mode, MD      . traZODone (DESYREL) tablet 25 mg  25 mg Oral Daily PRN Woodroe Mode, MD        PE: General appearance: alert, cooperative, no distress and moderately obese Neck: no carotid bruit and no JVD Lungs: clear to auscultation bilaterally Heart: regular rate and rhythm, S1, S2 normal, no murmur, click, rub or gallop Extremities: no LEE Pulses: 2+ and symmetric Skin: warm and dry Neurologic: Grossly normal  Lab Results:  No results for input(s): WBC, HGB, HCT, PLT in the last 72 hours. BMET No results for input(s): NA, K, CL, CO2, GLUCOSE, BUN, CREATININE, CALCIUM in the last 72 hours. PT/INR No results for input(s): LABPROT, INR in the last 72 hours. Cholesterol No results for input(s): CHOL in the last 72 hours. Cardiac Panel (last 3 results)  Recent Labs  08/24/14 1705  TROPONINI <0.03    Assessment/Plan  Active Problems:   Vulvar lesion   Chest pain   1. Chest Pain: Aytypical. Sharp intermittent pain. No dyspnea.  O2 sats 100%. EKG this am shows sinus bradycardia. HR 53 bpm. No ischemia.  Telemetry shows NSR with rate in  the 60s. No arrhythmias. Troponin negative x 1. Serial enzymes were ordered but not cycled x 3. Will reorder x 1. If second troponin is normal, ok for discharge with outpatient 2 day nuclear stress test.   2. HTN: Systolic BP better controlled. Continue atenolol, clonidine and furosemide.   MD to follow with further recommendations.   Brittainy M. Ladoris Gene 08/25/2014 8:02 AM   I have personally seen and examined this patient with Lyda Jester, PA-C. I agree with the assessment and plan as outlined above. Chest pain is atypical. If troponin is negative today, could d/c home from cardiac standpoint. Will need f/u with DR. Nishan. I will contact our office to call her for f/u appt and to arrange stress testing. If questions arise later today, call Dr. Haroldine Laws our on call  doctor at [phone] until 6:30 then on call after that for the night will be [phone]. Will sign off.   MCALHANY,CHRISTOPHER 08/25/2014 12:35 PM

## 2014-08-25 NOTE — Progress Notes (Signed)
Pt discharged back to [facility] accompanied by cousin, Eliane Decree, who was present to review d/c instructions & will make sure that pt goes to follow up appts.

## 2014-08-25 NOTE — Progress Notes (Signed)
1 Day Post-Op Procedure(s) (LRB): EXCISION VAGINAL LESION (Left)  Subjective: Patient reports minimal pain in chest.  She denies exertional pain. She reports some soreness in her vulva but no other sx.  She is eating well. No N/V.  She is voiding without difficulty.      Her troponins overnight were normal Objective: I have reviewed patient's vital signs, intake and output, medications and labs.  General: alert and no distress Resp: clear to auscultation bilaterally Cardio: regular rate and rhythm, S1, S2 normal, no murmur, click, rub or gallop GI: soft, non-tender; bowel sounds normal; no masses,  no organomegaly Extremities: extremities normal, atraumatic, no cyanosis or edema   CBG (last 3)   Recent Labs  08/24/14 1222 08/24/14 1538 08/24/14 1831  GLUCAP 91 72 112*     Assessment: s/p Procedure(s): EXCISION VAGINAL LESION (Left): progressing well Acute chest pain after procedure- Cardiology ot come and round on pt this am  Plan: cont current meds  Cont observation until seen by Cardiology. Discharge planning per cardiologist recommendation       [name], Sabrina Leas 08/25/2014, 7:25 AM

## 2014-08-25 NOTE — Discharge Instructions (Signed)
Vulva Biopsy Care After Refer to this sheet in the next few weeks. These instructions provide you with information on caring for yourself after your procedure. Your caregiver may also give you more specific instructions. Your treatment has been planned according to current medical practices, but problems sometimes occur. Call your caregiver if you have any problems or questions after your procedure.  HOME CARE INSTRUCTIONS  Only take over-the-counter or prescription medicines for pain or discomfort as directed by your caregiver.  Do not rub the biopsy area after urinating. Gently pat the area dry or use a bottle filled with warm water (peri-bottle) to clean the area. Gently wipe from front to back.  Clean the area using water and mild soap. Gently pat the area dry.  Check your biopsy site with a handheld mirror daily to be sure it is healing properly.  Do not have intercourse for 3-4 days or as directed by your caregiver.  Avoid exercise, such as running or biking, for 2-3 days or as directed by your caregiver.  You may shower after the procedure. Avoid bathing and swimming until the sutures dissolve and healing is complete.  Wear loose cotton underwear. Do not wear tight pants.  Keep all follow-up appointments with your caregiver.  Contact your caregiver to obtain your test results. SEEK IMMEDIATE MEDICAL CARE IF:  Your pain increases and medicine does not help it.  Your vaginal area becomes swollen or red.  You have fluid or an abnormally bad smell coming from your vagina.  You have a fever. MAKE SURE YOU:  Understand these instructions.  Will watch your condition.  Will get help right away if you are not doing well or get worse. Document Released: 03/03/2012 Document Reviewed: 03/03/2012 [facility] Patient Information 2015 [place]. This information is not intended to replace advice given to you by your health care provider. Make sure you discuss any questions you  have with your health care provider. Chest Pain (Nonspecific) It is often hard to give a specific diagnosis for the cause of chest pain. There is always a chance that your pain could be related to something serious, such as a heart attack or a blood clot in the lungs. You need to follow up with your health care provider for further evaluation. CAUSES   Heartburn.  Pneumonia or bronchitis.  Anxiety or stress.  Inflammation around your heart (pericarditis) or lung (pleuritis or pleurisy).  A blood clot in the lung.  A collapsed lung (pneumothorax). It can develop suddenly on its own (spontaneous pneumothorax) or from trauma to the chest.  Shingles infection (herpes zoster virus). The chest wall is composed of bones, muscles, and cartilage. Any of these can be the source of the pain.  The bones can be bruised by injury.  The muscles or cartilage can be strained by coughing or overwork.  The cartilage can be affected by inflammation and become sore (costochondritis). DIAGNOSIS  Lab tests or other studies may be needed to find the cause of your pain. Your health care provider may have you take a test called an ambulatory electrocardiogram (ECG). An ECG records your heartbeat patterns over a 24-hour period. You may also have other tests, such as:  Transthoracic echocardiogram (TTE). During echocardiography, sound waves are used to evaluate how blood flows through your heart.  Transesophageal echocardiogram (TEE).  Cardiac monitoring. This allows your health care provider to monitor your heart rate and rhythm in real time.  Holter monitor. This is a portable device that records your  heartbeat and can help diagnose heart arrhythmias. It allows your health care provider to track your heart activity for several days, if needed.  Stress tests by exercise or by giving medicine that makes the heart beat faster. TREATMENT   Treatment depends on what may be causing your chest pain. Treatment  may include:  Acid blockers for heartburn.  Anti-inflammatory medicine.  Pain medicine for inflammatory conditions.  Antibiotics if an infection is present.  You may be advised to change lifestyle habits. This includes stopping smoking and avoiding alcohol, caffeine, and chocolate.  You may be advised to keep your head raised (elevated) when sleeping. This reduces the chance of acid going backward from your stomach into your esophagus. Most of the time, nonspecific chest pain will improve within 2-3 days with rest and mild pain medicine.  HOME CARE INSTRUCTIONS   If antibiotics were prescribed, take them as directed. Finish them even if you start to feel better.  For the next few days, avoid physical activities that bring on chest pain. Continue physical activities as directed.  Do not use any tobacco products, including cigarettes, chewing tobacco, or electronic cigarettes.  Avoid drinking alcohol.  Only take medicine as directed by your health care provider.  Follow your health care provider's suggestions for further testing if your chest pain does not go away.  Keep any follow-up appointments you made. If you do not go to an appointment, you could develop lasting (chronic) problems with pain. If there is any problem keeping an appointment, call to reschedule. SEEK MEDICAL CARE IF:   Your chest pain does not go away, even after treatment.  You have a rash with blisters on your chest.  You have a fever. SEEK IMMEDIATE MEDICAL CARE IF:   You have increased chest pain or pain that spreads to your arm, neck, jaw, back, or abdomen.  You have shortness of breath.  You have an increasing cough, or you cough up blood.  You have severe back or abdominal pain.  You feel nauseous or vomit.  You have severe weakness.  You faint.  You have chills. This is an emergency. Do not wait to see if the pain will go away. Get medical help at once. Call your local emergency services  (911 in [place] Do not drive yourself to the hospital. MAKE SURE YOU:   Understand these instructions.  Will watch your condition.  Will get help right away if you are not doing well or get worse. Document Released: 12/25/2004 Document Revised: 03/22/2013 Document Reviewed: 10/21/2007 [facility] Patient Information 2015 [place], [place]. This information is not intended to replace advice given to you by your health care provider. Make sure you discuss any questions you have with your health care provider.

## 2014-08-25 NOTE — Plan of Care (Signed)
Problem: Phase II Progression Outcomes Goal: Stress Test if indicated Outcome: Not Applicable Date Met:  26/94/85 To be done as Outpatient

## 2014-08-25 NOTE — Anesthesia Postprocedure Evaluation (Signed)
  Anesthesia Post-op Note  Patient: Sabrina [name]  Procedure(s) Performed: Procedure(s): EXCISION VAGINAL LESION (Left)  Patient Location: A-ICU  Anesthesia Type:General  Level of Consciousness: awake, alert , oriented and patient cooperative  Airway and Oxygen Therapy: Patient Spontanous Breathing and Patient connected to nasal cannula oxygen  Post-op Pain: none  Post-op Assessment: Post-op Vital signs reviewed, Patient's Cardiovascular Status Stable, Respiratory Function Stable, Patent Airway, No signs of Nausea or vomiting, Adequate PO intake, Pain level controlled, No headache, No backache, No residual numbness and No residual motor weakness  Post-op Vital Signs: Reviewed and stable  Last Vitals:  Filed Vitals:   08/25/14 0809  BP:   Pulse:   Temp: 36.4 C  Resp:     Complications: No apparent anesthesia complications

## 2014-08-25 NOTE — Discharge Summary (Signed)
Physician Discharge Summary  Patient ID: Sabrina [name] MRN: [id number] DOB/AGE: 05-26-1960 54 y.o.  Admit date: 08/24/2014 Discharge date:   Admission Diagnoses:  Active Problems:   Vulvar lesion   Chest pain   Discharge Diagnoses:  Same  Past Medical History  Diagnosis Date  . Diabetes mellitus   . Hypertension   . Arthritis     Health serve records indicate Rheumatoid  . Hyperlipidemia LDL goal < 100   . Gout   . Mixed restrictive and obstructive lung disease     Health serve chart suggests PFTs done 1/10  . Bipolar disorder     2 breakdowns - 1998, 2000 had to be hospitalized, followed at [facility]  . GERD (gastroesophageal reflux disease)   . Morbid obesity with BMI of 40.0-44.9, adult   . Depression     Surgeries: Procedure(s): EXCISION VAGINAL LESION on 08/24/2014   Consultants: Treatment Team:  Rounding Lbcardiology, MD  Discharged Condition: Improved  Hospital Course: COLLINS DIMARIA is an 54 y.o. female [place] who was admitted 08/24/2014 with a chief complaint of vulvar lesion. They were brought to the operating room on 08/24/2014 and underwent the above named procedures.   They were given sequential compression devices, early ambulation, and chemoprophylaxis for DVT prophylaxis. Postoperatively she developed chest pain. She was given NTG and Morphine. Cardiology consult was obtained.  She had serial EKG's and Troponin's and was ruled out for AMI. She will f/u with them for further w/u and testing on an outpatient basis. Pain control was done with Dermoplast.  They benefited maximally from their hospital stay and there were no complications.    Recent vital signs:  Filed Vitals:   08/25/14 1425  BP:   Pulse:   Temp:   Resp: 14    Recent laboratory studies:  Results for orders placed or performed during the hospital encounter of 08/24/14  MRSA PCR Screening  Result Value Ref Range   MRSA by PCR POSITIVE (A) NEGATIVE  Glucose, capillary  Result  Value Ref Range   Glucose-Capillary 91 65 - 99 mg/dL  Glucose, capillary  Result Value Ref Range   Glucose-Capillary 72 65 - 99 mg/dL  Troponin I (q 6hr x 3)  Result Value Ref Range   Troponin I <0.03 <0.031 ng/mL  Glucose, capillary  Result Value Ref Range   Glucose-Capillary 112 (H) 65 - 99 mg/dL  Glucose, capillary  Result Value Ref Range   Glucose-Capillary 86 65 - 99 mg/dL  Troponin I  Result Value Ref Range   Troponin I <0.03 <0.031 ng/mL  Glucose, capillary  Result Value Ref Range   Glucose-Capillary 109 (H) 65 - 99 mg/dL   Comment 1 Notify RN    Comment 2 Document in Chart     Discharge Medications:     Medication List    TAKE these medications        atenolol 100 MG tablet  Commonly known as:  TENORMIN  Take 100 mg by mouth every morning.     benzocaine-Menthol 20-0.5 % Aero  Commonly known as:  DERMOPLAST  Apply 1 application topically 4 (four) times daily as needed for irritation.     cloNIDine 0.2 MG tablet  Commonly known as:  CATAPRES  Take 1 tablet (0.2 mg total) by mouth 3 (three) times daily.     divalproex 500 MG 24 hr tablet  Commonly known as:  DEPAKOTE ER  Take 1,000 mg by mouth at bedtime.     furosemide 20 MG  tablet  Commonly known as:  LASIX  Take 1 tablet (20 mg total) by mouth daily.     HYDROcodone-acetaminophen 5-325 MG per tablet  Commonly known as:  NORCO/VICODIN  Take 2 tablets by mouth every 4 (four) hours as needed.     hydrOXYzine 25 MG capsule  Commonly known as:  VISTARIL  Take 1 capsule (25 mg total) by mouth at bedtime.     ibuprofen 600 MG tablet  Commonly known as:  ADVIL,MOTRIN  Take 1 tablet (600 mg total) by mouth every 6 (six) hours as needed.     lidocaine 5 % ointment  Commonly known as:  XYLOCAINE  Apply topically 4 (four) times daily as needed (Apply to vulva incision if pain unrelieved by Dermoplast spray).     loratadine 10 MG tablet  Commonly known as:  CLARITIN  Take 10 mg by mouth daily as needed  for allergies.     metFORMIN 850 MG tablet  Commonly known as:  GLUCOPHAGE  Take 1 tablet (850 mg total) by mouth every morning.     mupirocin ointment 2 %  Commonly known as:  BACTROBAN  Place 1 application into the nose 2 (two) times daily.     naproxen 500 MG tablet  Commonly known as:  NAPROSYN  Take 1 tablet (500 mg total) by mouth 2 (two) times daily.     omeprazole 20 MG capsule  Commonly known as:  PRILOSEC  Take 1 capsule (20 mg total) by mouth every morning.     oxybutynin 5 MG tablet  Commonly known as:  DITROPAN  Take 1 tablet (5 mg total) by mouth 2 (two) times daily.     traZODone 50 MG tablet  Commonly known as:  DESYREL  Take 25 mg by mouth daily as needed for sleep (sleep).        Diagnostic Studies: troponins < 0.03 x 2, CBG 109 MRSA positive  Disposition: 01-Home or Self Care      Discharge Instructions    Call MD for:  persistant nausea and vomiting    Complete by:  As directed      Call MD for:  severe uncontrolled pain    Complete by:  As directed      Call MD for:  temperature >100.4    Complete by:  As directed      Diet - low sodium heart healthy    Complete by:  As directed      Increase activity slowly    Complete by:  As directed      No dressing needed    Complete by:  As directed   Keep area clean and dry           Follow-up Information    Follow up with [facility] In 2 weeks.   Specialty:  Obstetrics and Gynecology   Why:  postop check, they will call you with appointment   Contact information:   [place] [phone]      Follow up with [facility].   Why:  Hospital follow-up, they will call you further instructions   Contact information:   [address] [place] [address] [phone]       Signed: Donnamae Jude 08/25/2014, 2:45 PM

## 2014-08-29 ENCOUNTER — Other Ambulatory Visit: Payer: Self-pay | Admitting: *Deleted

## 2014-08-29 ENCOUNTER — Telehealth: Payer: Self-pay | Admitting: Internal Medicine

## 2014-08-29 ENCOUNTER — Other Ambulatory Visit: Payer: Self-pay | Admitting: Internal Medicine

## 2014-08-29 DIAGNOSIS — E1165 Type 2 diabetes mellitus with hyperglycemia: Secondary | ICD-10-CM

## 2014-08-29 DIAGNOSIS — R32 Unspecified urinary incontinence: Secondary | ICD-10-CM

## 2014-08-29 DIAGNOSIS — R0789 Other chest pain: Secondary | ICD-10-CM

## 2014-08-29 MED ORDER — OXYBUTYNIN CHLORIDE 5 MG PO TABS: 5.0000 mg | ORAL_TABLET | Freq: Two times a day (BID) | ORAL | Status: DC

## 2014-08-29 NOTE — Telephone Encounter (Signed)
Please refill.

## 2014-08-29 NOTE — Telephone Encounter (Signed)
Patient came into office requesting medication refill for metFORMIN (GLUCOPHAGE) 850 MG tablet,oxybutynin (DITROPAN) 5 MG tablet. Please f/u with patient.  Patient is in clinic

## 2014-09-06 ENCOUNTER — Encounter: Payer: Self-pay | Admitting: Obstetrics & Gynecology

## 2014-09-06 ENCOUNTER — Ambulatory Visit (INDEPENDENT_AMBULATORY_CARE_PROVIDER_SITE_OTHER): Payer: Self-pay | Admitting: Obstetrics & Gynecology

## 2014-09-06 VITALS — BP 144/88 | HR 69 | Temp 98.4°F | Ht 62.0 in | Wt 272.2 lb

## 2014-09-06 DIAGNOSIS — Z9889 Other specified postprocedural states: Secondary | ICD-10-CM

## 2014-09-06 DIAGNOSIS — D219 Benign neoplasm of connective and other soft tissue, unspecified: Secondary | ICD-10-CM

## 2014-09-06 NOTE — Progress Notes (Signed)
Patient ID: Sabrina [name], female   DOB: 04/24/1960, 54 y.o.   MRN: [id number] Subjective:   cc: still sore at op site left vulva 2 week postop  Sabrina [name] is a 54 y.o. 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 Eating a regular diet without difficulty. Bowel movements are normal. Pain is controlled without any medications.  The following portions of the patient's history were reviewed and updated as appropriate: allergies, current medications, past family history, past medical history, past social history, past surgical history and problem list.  Review of Systems  SOB and CP with exertion mild vulvar soreness   Objective:    BP 144/88 mmHg  Pulse 69  Temp(Src) 98.4 F (36.9 C) (Oral)  Ht 5\' 2"  (1.575 m)  Wt 123.469 kg (272 lb 3.2 oz)  BMI 49.77 kg/m2 General:  alert, cooperative and no distress     Incision:   healing well, no drainage, no erythema, no hernia, no seroma, no swelling, no dehiscence, incision well approximated     Assessment:    Doing well postoperatively. Operative findings again reviewed. Pathology report discussed. Not malignant  atypical CP evaluated postop Plan:    1. Continue any current medications. 2. Wound care discussed. 3. Activity restrictions: no climbing stairs 4. Anticipated return to work: 1-2 weeks. 5. Follow up: 6 mo prn IM and cards f/u  Woodroe Mode, MD 09/06/2014

## 2014-09-06 NOTE — Patient Instructions (Signed)

## 2014-09-13 ENCOUNTER — Telehealth (HOSPITAL_COMMUNITY): Payer: Self-pay | Admitting: *Deleted

## 2014-09-13 NOTE — Telephone Encounter (Signed)
Left message on voicemail in reference to upcoming appointment scheduled for 09/18/14. Phone number given for a call back so details instructions can be given. Hubbard Mandler, RN

## 2014-09-18 ENCOUNTER — Ambulatory Visit (HOSPITAL_COMMUNITY): Payer: No Typology Code available for payment source | Attending: Cardiology

## 2014-09-18 DIAGNOSIS — R9439 Abnormal result of other cardiovascular function study: Secondary | ICD-10-CM | POA: Insufficient documentation

## 2014-09-18 DIAGNOSIS — R0789 Other chest pain: Secondary | ICD-10-CM

## 2014-09-18 MED ORDER — TECHNETIUM TC 99M SESTAMIBI GENERIC - CARDIOLITE
33.0000 | Freq: Once | INTRAVENOUS | Status: AC | PRN
  Administered 2014-09-18: 33 via INTRAVENOUS

## 2014-09-19 ENCOUNTER — Encounter: Payer: Self-pay | Admitting: Cardiovascular Disease

## 2014-09-19 ENCOUNTER — Ambulatory Visit (HOSPITAL_COMMUNITY): Payer: No Typology Code available for payment source | Attending: Cardiology

## 2014-09-19 DIAGNOSIS — R0789 Other chest pain: Secondary | ICD-10-CM | POA: Insufficient documentation

## 2014-09-19 LAB — MYOCARDIAL PERFUSION IMAGING
CHL CUP NUCLEAR SDS: 5
CHL CUP RESTING HR STRESS: 53 {beats}/min
LHR: 0.21
LV sys vol: 30 mL
LVDIAVOL: 79 mL
Peak HR: 93 {beats}/min
SRS: 3
SSS: 8
TID: 1.02

## 2014-09-19 MED ORDER — REGADENOSON 0.4 MG/5ML IV SOLN
0.4000 mg | Freq: Once | INTRAVENOUS | Status: AC
  Administered 2014-09-19: 0.4 mg via INTRAVENOUS

## 2014-09-19 MED ORDER — TECHNETIUM TC 99M SESTAMIBI GENERIC - CARDIOLITE
33.0000 | Freq: Once | INTRAVENOUS | Status: AC | PRN
  Administered 2014-09-19: 33 via INTRAVENOUS

## 2014-09-25 ENCOUNTER — Telehealth: Payer: Self-pay | Admitting: Cardiovascular Disease

## 2014-09-25 NOTE — Telephone Encounter (Signed)
New message      Pt returning call regarding test results.  Please advise

## 2014-09-25 NOTE — Telephone Encounter (Signed)
[email or web address] 

## 2014-09-29 NOTE — Telephone Encounter (Signed)
I attempted to call the patient at her contact #. Phone rang multiple times and then beeped- unsure if this was for voice mail, but attempted to leave a voice mail for the patient to call back.

## 2014-10-03 ENCOUNTER — Emergency Department (HOSPITAL_COMMUNITY): Payer: No Typology Code available for payment source

## 2014-10-03 ENCOUNTER — Encounter (HOSPITAL_COMMUNITY): Payer: Self-pay | Admitting: *Deleted

## 2014-10-03 ENCOUNTER — Emergency Department (HOSPITAL_COMMUNITY)
Admission: EM | Admit: 2014-10-03 | Discharge: 2014-10-03 | Disposition: A | Discharge status: Home / Self Care | Payer: No Typology Code available for payment source | Attending: Emergency Medicine | Admitting: Emergency Medicine

## 2014-10-03 DIAGNOSIS — R059 Cough, unspecified: Secondary | ICD-10-CM

## 2014-10-03 DIAGNOSIS — F329 Major depressive disorder, single episode, unspecified: Secondary | ICD-10-CM | POA: Insufficient documentation

## 2014-10-03 DIAGNOSIS — Z8739 Personal history of other diseases of the musculoskeletal system and connective tissue: Secondary | ICD-10-CM | POA: Insufficient documentation

## 2014-10-03 DIAGNOSIS — Z880 Allergy status to penicillin: Secondary | ICD-10-CM | POA: Insufficient documentation

## 2014-10-03 DIAGNOSIS — H9203 Otalgia, bilateral: Secondary | ICD-10-CM | POA: Insufficient documentation

## 2014-10-03 DIAGNOSIS — Z791 Long term (current) use of non-steroidal anti-inflammatories (NSAID): Secondary | ICD-10-CM | POA: Insufficient documentation

## 2014-10-03 DIAGNOSIS — M1990 Unspecified osteoarthritis, unspecified site: Secondary | ICD-10-CM | POA: Insufficient documentation

## 2014-10-03 DIAGNOSIS — K219 Gastro-esophageal reflux disease without esophagitis: Secondary | ICD-10-CM | POA: Insufficient documentation

## 2014-10-03 DIAGNOSIS — I10 Essential (primary) hypertension: Secondary | ICD-10-CM | POA: Insufficient documentation

## 2014-10-03 DIAGNOSIS — R0789 Other chest pain: Secondary | ICD-10-CM | POA: Insufficient documentation

## 2014-10-03 DIAGNOSIS — R05 Cough: Secondary | ICD-10-CM

## 2014-10-03 DIAGNOSIS — Z79899 Other long term (current) drug therapy: Secondary | ICD-10-CM | POA: Insufficient documentation

## 2014-10-03 DIAGNOSIS — E119 Type 2 diabetes mellitus without complications: Secondary | ICD-10-CM | POA: Insufficient documentation

## 2014-10-03 DIAGNOSIS — J069 Acute upper respiratory infection, unspecified: Secondary | ICD-10-CM

## 2014-10-03 LAB — BASIC METABOLIC PANEL
ANION GAP: 8 (ref 5–15)
BUN: 8 mg/dL (ref 6–20)
CALCIUM: 9.2 mg/dL (ref 8.9–10.3)
CO2: 26 mmol/L (ref 22–32)
CREATININE: 0.8 mg/dL (ref 0.44–1.00)
Chloride: 102 mmol/L (ref 101–111)
Glucose, Bld: 107 mg/dL — ABNORMAL HIGH (ref 65–99)
Potassium: 4 mmol/L (ref 3.5–5.1)
SODIUM: 136 mmol/L (ref 135–145)

## 2014-10-03 LAB — CBC
HEMATOCRIT: 38.4 % (ref 36.0–46.0)
HEMOGLOBIN: 12.9 g/dL (ref 12.0–15.0)
MCH: 31.3 pg (ref 26.0–34.0)
MCHC: 33.6 g/dL (ref 30.0–36.0)
MCV: 93.2 fL (ref 78.0–100.0)
Platelets: 289 10*3/uL (ref 150–400)
RBC: 4.12 MIL/uL (ref 3.87–5.11)
RDW: 14 % (ref 11.5–15.5)
WBC: 9 10*3/uL (ref 4.0–10.5)

## 2014-10-03 LAB — I-STAT TROPONIN, ED: Troponin i, poc: 0 ng/mL (ref 0.00–0.08)

## 2014-10-03 MED ORDER — BENZONATATE 100 MG PO CAPS: 100.0000 mg | ORAL_CAPSULE | Freq: Three times a day (TID) | ORAL | Status: DC | PRN

## 2014-10-03 MED ORDER — BENZONATATE 100 MG PO CAPS
100.0000 mg | ORAL_CAPSULE | Freq: Once | ORAL | Status: AC
  Administered 2014-10-03: 100 mg via ORAL
  Filled 2014-10-03: qty 1

## 2014-10-03 NOTE — ED Notes (Signed)
The pt is c/o body aches headache  Cough especially at night.  She lives in a shelter but has been out -of -town for the past few days
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
## 2014-10-03 NOTE — Discharge Instructions (Signed)
Chest Wall Pain Chest wall pain is pain in or around the bones and muscles of your chest. It may take up to 6 weeks to get better. It may take longer if you must stay physically active in your work and activities.  CAUSES  Chest wall pain may happen on its own. However, it may be caused by:  A viral illness like the flu.  Injury.  Coughing.  Exercise.  Arthritis.  Fibromyalgia.  Shingles. HOME CARE INSTRUCTIONS   Avoid overtiring physical activity. Try not to strain or perform activities that cause pain. This includes any activities using your chest or your abdominal and side muscles, especially if heavy weights are used.  Put ice on the sore area.  Put ice in a plastic bag.  Place a towel between your skin and the bag.  Leave the ice on for 15-20 minutes per hour while awake for the first 2 days.  Only take over-the-counter or prescription medicines for pain, discomfort, or fever as directed by your caregiver. SEEK IMMEDIATE MEDICAL CARE IF:   Your pain increases, or you are very uncomfortable.  You have a fever.  Your chest pain becomes worse.  You have new, unexplained symptoms.  You have nausea or vomiting.  You feel sweaty or lightheaded.  You have a cough with phlegm (sputum), or you cough up blood. MAKE SURE YOU:   Understand these instructions.  Will watch your condition.  Will get help right away if you are not doing well or get worse. Document Released: 03/17/2005 Document Revised: 06/09/2011 Document Reviewed: 11/11/2010 [facility] Patient Information 2015 [place], [place]. This information is not intended to replace advice given to you by your health care provider. Make sure you discuss any questions you have with your health care provider.  Cough, Adult  A cough is a reflex that helps clear your throat and airways. It can help heal the body or may be a reaction to an irritated airway. A cough may only last 2 or 3 weeks (acute) or may last more than 8  weeks (chronic).  CAUSES Acute cough:  Viral or bacterial infections. Chronic cough:  Infections.  Allergies.  Asthma.  Post-nasal drip.  Smoking.  Heartburn or acid reflux.  Some medicines.  Chronic lung problems (COPD).  Cancer. SYMPTOMS   Cough.  Fever.  Chest pain.  Increased breathing rate.  High-pitched whistling sound when breathing (wheezing).  Colored mucus that you cough up (sputum). TREATMENT   A bacterial cough may be treated with antibiotic medicine.  A viral cough must run its course and will not respond to antibiotics.  Your caregiver may recommend other treatments if you have a chronic cough. HOME CARE INSTRUCTIONS   Only take over-the-counter or prescription medicines for pain, discomfort, or fever as directed by your caregiver. Use cough suppressants only as directed by your caregiver.  Use a cold steam vaporizer or humidifier in your bedroom or home to help loosen secretions.  Sleep in a semi-upright position if your cough is worse at night.  Rest as needed.  Stop smoking if you smoke. SEEK IMMEDIATE MEDICAL CARE IF:   You have pus in your sputum.  Your cough starts to worsen.  You cannot control your cough with suppressants and are losing sleep.  You begin coughing up blood.  You have difficulty breathing.  You develop pain which is getting worse or is uncontrolled with medicine.  You have a fever. MAKE SURE YOU:   Understand these instructions.  Will watch your  condition.  Will get help right away if you are not doing well or get worse. Document Released: 09/13/2010 Document Revised: 06/09/2011 Document Reviewed: 09/13/2010 [facility] Patient Information 2015 [place], [place]. This information is not intended to replace advice given to you by your health care provider. Make sure you discuss any questions you have with your health care provider.  Upper Respiratory Infection, Adult An upper respiratory infection (URI) is  also sometimes known as the common cold. The upper respiratory tract includes the nose, sinuses, throat, trachea, and bronchi. Bronchi are the airways leading to the lungs. Most people improve within 1 week, but symptoms can last up to 2 weeks. A residual cough may last even longer.  CAUSES Many different viruses can infect the tissues lining the upper respiratory tract. The tissues become irritated and inflamed and often become very moist. Mucus production is also common. A cold is contagious. You can easily spread the virus to others by oral contact. This includes kissing, sharing a glass, coughing, or sneezing. Touching your mouth or nose and then touching a surface, which is then touched by another person, can also spread the virus. SYMPTOMS  Symptoms typically develop 1 to 3 days after you come in contact with a cold virus. Symptoms vary from person to person. They may include:  Runny nose.  Sneezing.  Nasal congestion.  Sinus irritation.  Sore throat.  Loss of voice (laryngitis).  Cough.  Fatigue.  Muscle aches.  Loss of appetite.  Headache.  Low-grade fever. DIAGNOSIS  You might diagnose your own cold based on familiar symptoms, since most people get a cold 2 to 3 times a year. Your caregiver can confirm this based on your exam. Most importantly, your caregiver can check that your symptoms are not due to another disease such as strep throat, sinusitis, pneumonia, asthma, or epiglottitis. Blood tests, throat tests, and X-rays are not necessary to diagnose a common cold, but they may sometimes be helpful in excluding other more serious diseases. Your caregiver will decide if any further tests are required. RISKS AND COMPLICATIONS  You may be at risk for a more severe case of the common cold if you smoke cigarettes, have chronic heart disease (such as heart failure) or lung disease (such as asthma), or if you have a weakened immune system. The very young and very old are also at  risk for more serious infections. Bacterial sinusitis, middle ear infections, and bacterial pneumonia can complicate the common cold. The common cold can worsen asthma and chronic obstructive pulmonary disease (COPD). Sometimes, these complications can require emergency medical care and may be life-threatening. PREVENTION  The best way to protect against getting a cold is to practice good hygiene. Avoid oral or hand contact with people with cold symptoms. Wash your hands often if contact occurs. There is no clear evidence that vitamin C, vitamin E, echinacea, or exercise reduces the chance of developing a cold. However, it is always recommended to get plenty of rest and practice good nutrition. TREATMENT  Treatment is directed at relieving symptoms. There is no cure. Antibiotics are not effective, because the infection is caused by a virus, not by bacteria. Treatment may include:  Increased fluid intake. Sports drinks offer valuable electrolytes, sugars, and fluids.  Breathing heated mist or steam (vaporizer or shower).  Eating chicken soup or other clear broths, and maintaining good nutrition.  Getting plenty of rest.  Using gargles or lozenges for comfort.  Controlling fevers with ibuprofen or acetaminophen as directed by your  caregiver.  Increasing usage of your inhaler if you have asthma. Zinc gel and zinc lozenges, taken in the first 24 hours of the common cold, can shorten the duration and lessen the severity of symptoms. Pain medicines may help with fever, muscle aches, and throat pain. A variety of non-prescription medicines are available to treat congestion and runny nose. Your caregiver can make recommendations and may suggest nasal or lung inhalers for other symptoms.  HOME CARE INSTRUCTIONS   Only take over-the-counter or prescription medicines for pain, discomfort, or fever as directed by your caregiver.  Use a warm mist humidifier or inhale steam from a shower to increase air  moisture. This may keep secretions moist and make it easier to breathe.  Drink enough water and fluids to keep your urine clear or pale yellow.  Rest as needed.  Return to work when your temperature has returned to normal or as your caregiver advises. You may need to stay home longer to avoid infecting others. You can also use a face mask and careful hand washing to prevent spread of the virus. SEEK MEDICAL CARE IF:   After the first few days, you feel you are getting worse rather than better.  You need your caregiver's advice about medicines to control symptoms.  You develop chills, worsening shortness of breath, or brown or red sputum. These may be signs of pneumonia.  You develop yellow or brown nasal discharge or pain in the face, especially when you bend forward. These may be signs of sinusitis.  You develop a fever, swollen neck glands, pain with swallowing, or white areas in the back of your throat. These may be signs of strep throat. SEEK IMMEDIATE MEDICAL CARE IF:   You have a fever.  You develop severe or persistent headache, ear pain, sinus pain, or chest pain.  You develop wheezing, a prolonged cough, cough up blood, or have a change in your usual mucus (if you have chronic lung disease).  You develop sore muscles or a stiff neck. Document Released: 09/10/2000 Document Revised: 06/09/2011 Document Reviewed: 06/22/2013 [facility] Patient Information 2015 [place], [place]. This information is not intended to replace advice given to you by your health care provider. Make sure you discuss any questions you have with your health care provider.

## 2014-10-03 NOTE — ED Notes (Signed)
Pt c/o cough x 3 days with associated chest pain and chills.

## 2014-10-03 NOTE — ED Notes (Signed)
The pt is  Sleeping and her sats drop.  She denies sleep apnea she reports that she has been checked for that.  sats 97% when she is not sleeping

## 2014-10-03 NOTE — ED Notes (Signed)
Pt asking for  A note to give the shelter [facility]  Inside instgead of staying outside.  No from dr Lita Mains

## 2014-10-03 NOTE — ED Notes (Signed)
Hoarse cough

## 2014-10-05 ENCOUNTER — Ambulatory Visit: Payer: No Typology Code available for payment source | Attending: Internal Medicine | Admitting: Internal Medicine

## 2014-10-05 ENCOUNTER — Encounter: Payer: Self-pay | Admitting: Internal Medicine

## 2014-10-05 VITALS — BP 134/92 | HR 85 | Temp 94.0°F | Ht 64.0 in | Wt 269.0 lb

## 2014-10-05 DIAGNOSIS — J302 Other seasonal allergic rhinitis: Secondary | ICD-10-CM | POA: Insufficient documentation

## 2014-10-05 DIAGNOSIS — E119 Type 2 diabetes mellitus without complications: Secondary | ICD-10-CM | POA: Insufficient documentation

## 2014-10-05 LAB — GLUCOSE, POCT (MANUAL RESULT ENTRY): POC Glucose: 127 mg/dl — AB (ref 70–99)

## 2014-10-05 LAB — POCT GLYCOSYLATED HEMOGLOBIN (HGB A1C): Hemoglobin A1C: 5.7

## 2014-10-05 MED ORDER — LORATADINE 10 MG PO TABS: 10.0000 mg | ORAL_TABLET | Freq: Every day | ORAL | Status: DC
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
## 2014-10-05 NOTE — Progress Notes (Signed)
Patient following up after trip to ED for URI. She was given tessalon pearls for cough Needs refill on her lidocaine ointment

## 2014-10-05 NOTE — Telephone Encounter (Signed)
Attempted to call pt back X 3. Phone rings several times, then stops no answer service. Pt has been call several times since 09/20/14 to give the Myocardial Perfusion Imaging which  is abnormal. Pt needs to be follow by a PA/NP asap. Pt has an appointment with Dr. Johnsie Cancel on 11/30/14. Pt needs to be seen sooner.

## 2014-10-05 NOTE — Patient Instructions (Signed)
Begin Claritin for allergy symptoms of cough, watery eyes, and itchy throat. Continue taking cough medication

## 2014-10-05 NOTE — Telephone Encounter (Signed)
Follow Up  Pt returning RN phone call

## 2014-10-06 LAB — MICROALBUMIN, URINE: Microalb, Ur: 0.9 mg/dL (ref <2.0)

## 2014-10-06 NOTE — Telephone Encounter (Signed)
MAY HAVE  LEFT  VOICE MAIL  RE THE  NEED FOR  PT TO CALL BACK  TO  SCHEDULE  A FOLLOW UP APPT .Adonis Housekeeper

## 2014-10-11 ENCOUNTER — Encounter: Payer: Self-pay | Admitting: *Deleted

## 2014-10-11 NOTE — Telephone Encounter (Signed)
ANOTHER  MESSAGE  WAS LEFT  RESULTS LETTER  MAILED TO  PT   UNABLE TO REACH PT .Adonis Housekeeper

## 2014-10-13 ENCOUNTER — Telehealth: Payer: Self-pay | Admitting: Cardiovascular Disease

## 2014-10-13 NOTE — Telephone Encounter (Signed)
New message      Returning a nurses call to get lab results.  Please call before 2:45 or after 4

## 2014-10-13 NOTE — Telephone Encounter (Signed)
Called patient back to give Myoview perfusion results and let her know that it is abnormal and Dr. Johnsie Cancel would like to see her in the office.  VM left to call back office

## 2014-10-17 ENCOUNTER — Encounter: Payer: Self-pay | Admitting: Internal Medicine

## 2014-10-19 NOTE — Telephone Encounter (Signed)
LEFT MESSAGE ONCE AGAIN   AS  WELL AS   RESULTS  LETTER HAS  BEEN  MAILED TO   PT RE MYOVIEW  RESULTS    WILL AWAIT   RETURN CALL FROM PT .Adonis Housekeeper

## 2014-11-02 ENCOUNTER — Other Ambulatory Visit: Payer: Self-pay | Admitting: Internal Medicine

## 2014-11-03 ENCOUNTER — Other Ambulatory Visit: Payer: Self-pay | Admitting: Internal Medicine

## 2014-11-03 ENCOUNTER — Ambulatory Visit: Payer: No Typology Code available for payment source | Attending: Internal Medicine | Admitting: Pharmacist

## 2014-11-03 ENCOUNTER — Telehealth: Payer: Self-pay | Admitting: Cardiovascular Disease

## 2014-11-03 ENCOUNTER — Encounter (HOSPITAL_BASED_OUTPATIENT_CLINIC_OR_DEPARTMENT_OTHER): Payer: No Typology Code available for payment source | Admitting: Clinical

## 2014-11-03 VITALS — BP 161/96 | HR 52 | Wt 268.2 lb

## 2014-11-03 DIAGNOSIS — Z659 Problem related to unspecified psychosocial circumstances: Secondary | ICD-10-CM

## 2014-11-03 DIAGNOSIS — I10 Essential (primary) hypertension: Secondary | ICD-10-CM

## 2014-11-03 MED ORDER — CLONIDINE HCL 0.2 MG PO TABS: 0.2000 mg | ORAL_TABLET | Freq: Three times a day (TID) | ORAL | Status: DC
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
## 2014-11-03 NOTE — Telephone Encounter (Signed)
LM TO CALL BACK ./CY 

## 2014-11-03 NOTE — Patient Instructions (Signed)
It was great to see you today!  Keep going - we are here to help you!  Make an appointment with cardiology NOW  Make an appointment with our social worker, Roselyn Reef.   Come back in 2 weeks for a blood pressure check

## 2014-11-03 NOTE — Telephone Encounter (Signed)
SPOKE WITH  PT  EXPLAINED  HAVE BEEN  TRYING TO REACH  SINCE END OF  June  RE  ABNORMAL  MYOVIEW  PER  DR Lawrence  NEEDS APPT  TO DISCUSS  CATH   APPT  MADE  WITH [facility]  FOR  11-10-14 AT  11:00 AM  INSTRUCTED  IF  S/S  WORSEN TO  GO  TO ER  FOR EVAL AND  TREATMENT PT  VERBALIZED  UNDERSTANDING .CY

## 2014-11-03 NOTE — Progress Notes (Signed)
S:    Patient arrives in poor spirits. She presents to the clinic for hypertension evaluation.   Patient denies complete adherence with medications. She reports that she will sometimes skip doses of her medications because she does not want to take them due to feeling down. She also does not take furosemide on Wednesday due to work - furosemide increases her urination and it makes it difficult to work.   Current BP Medications include:  Atenolol 100 mg daily, clonidine 0.2 mg TID, furosemide 20 mg daily  Patient took her 2nd dose of clonidine for the day during the visit.   Patient reports significant stress due to her living conditions and inability to find a job. She reports that this makes her feel very depressed and stressed which she feels makes her health problems worse.    O:   Last 3 Office BP readings: BP Readings from Last 3 Encounters:  11/03/14 161/96  10/05/14 134/92  10/03/14 180/88    BMET    Component Value Date/Time   NA 136 10/03/2014 0047   K 4.0 10/03/2014 0047   CL 102 10/03/2014 0047   CO2 26 10/03/2014 0047   GLUCOSE 107* 10/03/2014 0047   BUN 8 10/03/2014 0047   CREATININE 0.80 10/03/2014 0047   CREATININE 0.88 08/07/2014 1522   CALCIUM 9.2 10/03/2014 0047   GFRNONAA >60 10/03/2014 0047   GFRAA >60 10/03/2014 0047    A/P:  Hypertension: currently is uncontrolled on current medications likely due to noncompliance and stress. The full effect of the second dose of the clonidine was unlikely able to be seen during the visit. Patient needs a follow up with cardiology so recommended that patient follow up ASAP with them. Also referred patient to our social worker, Roselyn Reef, for psychosocial issues. Will not make any changes to blood pressure medications at this time. Patient also reports issues with voiding bladder - recommended that she follow up with Chari Manning.     Results reviewed and written information provided.   F/U Clinic Visit with nurse/pharmacist  in two weeks for blood pressure check.  Total time in face-to-face counseling 35 minutes.  Patient seen with Bennye Alm, PharmD Resident.

## 2014-11-03 NOTE — Telephone Encounter (Signed)
New message    Pt having some SOB and labored breathing  Pt c/o Shortness Of Breath: STAT if SOB developed within the last 24 hours or pt is noticeably SOB on the phone  1. Are you currently SOB (can you hear that pt is SOB on the phone)? No  2. How long have you been experiencing SOB? 1 month  3. Are you SOB when sitting or when up moving around? Both but more when walking  4. Are you currently experiencing any other symptoms? Pain around heart  Please call to discuss

## 2014-11-06 ENCOUNTER — Ambulatory Visit: Payer: No Typology Code available for payment source | Attending: Internal Medicine | Admitting: Clinical

## 2014-11-06 DIAGNOSIS — F319 Bipolar disorder, unspecified: Secondary | ICD-10-CM

## 2014-11-06 DIAGNOSIS — Z659 Problem related to unspecified psychosocial circumstances: Secondary | ICD-10-CM

## 2014-11-06 NOTE — Progress Notes (Signed)
ASSESSMENT: Pt currently experiencing symptoms of bipolar disorder, along with psychosocial problems. She needs to f/u with PCP and keep [facility] appointment. She would benefit from continued supportive counseling to cope with symptoms of bipolar disorder and psychosocial problems. Stage of Change: precontemplative  PLAN: 1. F/U with behavioral health consultant in as needed 2. Psychiatric Medications: Trazadone. 3. Behavioral recommendation(s):   -Go to upcoming [facility] appointment on 11-07-14 -Schedule financial counseling appointment with [employer] to obtain orange card (to qualify for reduced fare bus and orange card transportation) - Social worker housing -Consider [facility] for [employer] referral (clothing, shoes) -Optometrist counseling ([employer] thrift store voucher for shoes)  SUBJECTIVE: Pt. referred by self for talk therapy  Pt. reports the following symptoms/concerns: Pt states that she is still at [employer], and is thinking about other housing options, but does not have financial means for other housing at this time; needs boots for upcoming winter, and does not have transportation. Pt is talking non-stop for entire hour, says she is crocheting to help her stay relaxed, and will see psychiatrist at [facility] in the morning.  Duration of problem: one month Severity: mild  OBJECTIVE: Orientation & Cognition: Oriented x3. Thought processes normal and appropriate to situation. Mood: expansive, excessively talkative. Affect: appropriate Appearance: appropriate Risk of harm to self or others: no risk of harm to self or others Substance use: none Assessments administered: PHQ9: 4/ GAD7: 3  Diagnosis: Bipolar disorder / Problem related to psychosocial circumstances CPT Code: F31.9/ Z65.9 -------------------------------------------- Other(s) present in the room: none  Time spent with patient in exam room: 60 minutes
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
## 2014-11-10 ENCOUNTER — Encounter: Payer: Self-pay | Admitting: Physician Assistant

## 2014-11-10 ENCOUNTER — Ambulatory Visit (INDEPENDENT_AMBULATORY_CARE_PROVIDER_SITE_OTHER): Payer: No Typology Code available for payment source | Admitting: Physician Assistant

## 2014-11-10 ENCOUNTER — Encounter: Payer: Self-pay | Admitting: *Deleted

## 2014-11-10 VITALS — BP 142/84 | HR 61 | Ht 64.0 in | Wt 268.4 lb

## 2014-11-10 DIAGNOSIS — E119 Type 2 diabetes mellitus without complications: Secondary | ICD-10-CM

## 2014-11-10 DIAGNOSIS — R0789 Other chest pain: Secondary | ICD-10-CM

## 2014-11-10 DIAGNOSIS — R9439 Abnormal result of other cardiovascular function study: Secondary | ICD-10-CM

## 2014-11-10 DIAGNOSIS — I10 Essential (primary) hypertension: Secondary | ICD-10-CM

## 2014-11-10 DIAGNOSIS — E1169 Type 2 diabetes mellitus with other specified complication: Secondary | ICD-10-CM

## 2014-11-10 DIAGNOSIS — E669 Obesity, unspecified: Secondary | ICD-10-CM

## 2014-11-10 DIAGNOSIS — E785 Hyperlipidemia, unspecified: Secondary | ICD-10-CM

## 2014-11-10 DIAGNOSIS — R931 Abnormal findings on diagnostic imaging of heart and coronary circulation: Secondary | ICD-10-CM

## 2014-11-10 LAB — CBC WITH DIFFERENTIAL/PLATELET
Basophils Absolute: 0 10*3/uL (ref 0.0–0.1)
Basophils Relative: 0.4 % (ref 0.0–3.0)
EOS PCT: 0.9 % (ref 0.0–5.0)
Eosinophils Absolute: 0.1 10*3/uL (ref 0.0–0.7)
HEMATOCRIT: 37.2 % (ref 36.0–46.0)
HEMOGLOBIN: 12.4 g/dL (ref 12.0–15.0)
LYMPHS ABS: 3.7 10*3/uL (ref 0.7–4.0)
LYMPHS PCT: 43.2 % (ref 12.0–46.0)
MCHC: 33.4 g/dL (ref 30.0–36.0)
MCV: 94.8 fl (ref 78.0–100.0)
MONOS PCT: 10.4 % (ref 3.0–12.0)
Monocytes Absolute: 0.9 10*3/uL (ref 0.1–1.0)
Neutro Abs: 3.9 10*3/uL (ref 1.4–7.7)
Neutrophils Relative %: 45.1 % (ref 43.0–77.0)
PLATELETS: 294 10*3/uL (ref 150.0–400.0)
RBC: 3.92 Mil/uL (ref 3.87–5.11)
RDW: 15.3 % (ref 11.5–15.5)
WBC: 8.7 10*3/uL (ref 4.0–10.5)

## 2014-11-10 LAB — BASIC METABOLIC PANEL
BUN: 11 mg/dL (ref 6–23)
CALCIUM: 9.5 mg/dL (ref 8.4–10.5)
CO2: 30 meq/L (ref 19–32)
CREATININE: 0.85 mg/dL (ref 0.40–1.20)
Chloride: 101 mEq/L (ref 96–112)
GFR: 90.01 mL/min (ref >60.00)
GLUCOSE: 87 mg/dL (ref 70–99)
Potassium: 3.5 mEq/L (ref 3.5–5.1)
Sodium: 138 mEq/L (ref 135–145)

## 2014-11-10 LAB — PROTIME-INR
INR: 1.1 ratio — AB (ref 0.8–1.0)
Prothrombin Time: 12.2 s (ref 9.6–13.1)

## 2014-11-10 MED ORDER — NITROGLYCERIN 0.4 MG SL SUBL: 0.4000 mg | SUBLINGUAL_TABLET | SUBLINGUAL | Status: DC | PRN

## 2014-11-10 MED ORDER — ASPIRIN EC 81 MG PO TBEC: 81.0000 mg | DELAYED_RELEASE_TABLET | Freq: Every day | ORAL | Status: DC

## 2014-11-10 NOTE — Patient Instructions (Signed)
Medication Instructions:  1. START ASPIRIN 81 MG DAILY  2. AN RX FOR NITROGLYCERIN HAS BEEN SENT IN AND YOU HAVE BEEN ADVISED BY SCOTT WEAVER, PAC AS TO HOW AND WHEN TO USE NTG  Labwork: TODAY BMET, CBC W/DIFF, PT/INR  Testing/Procedures: Your physician has requested that you have a cardiac catheterization ON 11/15/14. Cardiac catheterization is used to diagnose and/or treat various heart conditions. Doctors may recommend this procedure for a number of different reasons. The most common reason is to evaluate chest pain. Chest pain can be a symptom of coronary artery disease (CAD), and cardiac catheterization can show whether plaque is narrowing or blocking your heart's arteries. This procedure is also used to evaluate the valves, as well as measure the blood flow and oxygen levels in different parts of your heart. For further information please visit [email or web address]. Please follow instruction sheet, as given.   Follow-Up: 11/29/14 @ 12:10 WITH SCOTT WEAVER, PAC; POST CATH APPT  Any Other Special Instructions Will Be Listed Below (If Applicable).

## 2014-11-13 ENCOUNTER — Telehealth: Payer: Self-pay | Admitting: *Deleted

## 2014-11-13 NOTE — Telephone Encounter (Signed)
Pt notfied of lab results ok for cath. Pt verbalized understanding by phone.

## 2014-11-15 ENCOUNTER — Encounter (HOSPITAL_COMMUNITY)
Admission: RE | Disposition: A | Discharge status: Home / Self Care | Payer: Self-pay | Source: Ambulatory Visit | Attending: Cardiovascular Disease

## 2014-11-15 ENCOUNTER — Telehealth: Payer: Self-pay | Admitting: Cardiovascular Disease

## 2014-11-15 ENCOUNTER — Encounter (HOSPITAL_COMMUNITY): Payer: Self-pay | Admitting: General Practice

## 2014-11-15 ENCOUNTER — Observation Stay (HOSPITAL_COMMUNITY)
Admission: RE | Admit: 2014-11-15 | Discharge: 2014-11-16 | Disposition: A | Discharge status: Home / Self Care | Payer: No Typology Code available for payment source | Source: Ambulatory Visit | Attending: Cardiovascular Disease | Admitting: Cardiovascular Disease

## 2014-11-15 ENCOUNTER — Ambulatory Visit: Payer: Self-pay | Admitting: Cardiovascular Disease

## 2014-11-15 DIAGNOSIS — E1165 Type 2 diabetes mellitus with hyperglycemia: Secondary | ICD-10-CM

## 2014-11-15 DIAGNOSIS — F419 Anxiety disorder, unspecified: Secondary | ICD-10-CM | POA: Insufficient documentation

## 2014-11-15 DIAGNOSIS — F319 Bipolar disorder, unspecified: Secondary | ICD-10-CM | POA: Insufficient documentation

## 2014-11-15 DIAGNOSIS — R0789 Other chest pain: Secondary | ICD-10-CM

## 2014-11-15 DIAGNOSIS — K219 Gastro-esophageal reflux disease without esophagitis: Secondary | ICD-10-CM | POA: Insufficient documentation

## 2014-11-15 DIAGNOSIS — I152 Hypertension secondary to endocrine disorders: Secondary | ICD-10-CM | POA: Diagnosis present

## 2014-11-15 DIAGNOSIS — Z79899 Other long term (current) drug therapy: Secondary | ICD-10-CM | POA: Insufficient documentation

## 2014-11-15 DIAGNOSIS — E119 Type 2 diabetes mellitus without complications: Secondary | ICD-10-CM | POA: Insufficient documentation

## 2014-11-15 DIAGNOSIS — E1159 Type 2 diabetes mellitus with other circulatory complications: Secondary | ICD-10-CM | POA: Diagnosis present

## 2014-11-15 DIAGNOSIS — E669 Obesity, unspecified: Secondary | ICD-10-CM

## 2014-11-15 DIAGNOSIS — Z590 Homelessness: Secondary | ICD-10-CM | POA: Insufficient documentation

## 2014-11-15 DIAGNOSIS — Z7982 Long term (current) use of aspirin: Secondary | ICD-10-CM | POA: Insufficient documentation

## 2014-11-15 DIAGNOSIS — R9439 Abnormal result of other cardiovascular function study: Secondary | ICD-10-CM | POA: Insufficient documentation

## 2014-11-15 DIAGNOSIS — Z79891 Long term (current) use of opiate analgesic: Secondary | ICD-10-CM | POA: Insufficient documentation

## 2014-11-15 DIAGNOSIS — R931 Abnormal findings on diagnostic imaging of heart and coronary circulation: Secondary | ICD-10-CM

## 2014-11-15 DIAGNOSIS — E785 Hyperlipidemia, unspecified: Secondary | ICD-10-CM | POA: Insufficient documentation

## 2014-11-15 DIAGNOSIS — R079 Chest pain, unspecified: Principal | ICD-10-CM | POA: Insufficient documentation

## 2014-11-15 DIAGNOSIS — I10 Essential (primary) hypertension: Secondary | ICD-10-CM | POA: Insufficient documentation

## 2014-11-15 DIAGNOSIS — Z791 Long term (current) use of non-steroidal anti-inflammatories (NSAID): Secondary | ICD-10-CM | POA: Insufficient documentation

## 2014-11-15 DIAGNOSIS — E1169 Type 2 diabetes mellitus with other specified complication: Secondary | ICD-10-CM

## 2014-11-15 DIAGNOSIS — Z6841 Body Mass Index (BMI) 40.0 and over, adult: Secondary | ICD-10-CM | POA: Insufficient documentation

## 2014-11-15 DIAGNOSIS — J449 Chronic obstructive pulmonary disease, unspecified: Secondary | ICD-10-CM | POA: Insufficient documentation

## 2014-11-15 DIAGNOSIS — A749 Chlamydial infection, unspecified: Secondary | ICD-10-CM | POA: Diagnosis present

## 2014-11-15 HISTORY — DX: Low back pain, unspecified: M54.50

## 2014-11-15 HISTORY — DX: Type 2 diabetes mellitus without complications: E11.9

## 2014-11-15 HISTORY — DX: Unspecified convulsions: R56.9

## 2014-11-15 HISTORY — PX: CARDIAC CATHETERIZATION: SHX172

## 2014-11-15 HISTORY — DX: Anxiety disorder, unspecified: F41.9

## 2014-11-15 HISTORY — DX: Migraine, unspecified, not intractable, without status migrainosus: G43.909

## 2014-11-15 HISTORY — DX: Rheumatoid arthritis, unspecified: M06.9

## 2014-11-15 HISTORY — DX: Other chronic pain: G89.29

## 2014-11-15 HISTORY — DX: Unspecified chronic bronchitis: J42

## 2014-11-15 HISTORY — DX: Low back pain: M54.5

## 2014-11-15 LAB — GLUCOSE, CAPILLARY
GLUCOSE-CAPILLARY: 90 mg/dL (ref 65–99)
GLUCOSE-CAPILLARY: 74 mg/dL (ref 65–99)

## 2014-11-15 LAB — MRSA PCR SCREENING: MRSA BY PCR: NEGATIVE

## 2014-11-15 SURGERY — LEFT HEART CATH AND CORONARY ANGIOGRAPHY
Anesthesia: LOCAL

## 2014-11-15 MED ORDER — CLONIDINE HCL 0.2 MG PO TABS
0.2000 mg | ORAL_TABLET | Freq: Once | ORAL | Status: AC
  Administered 2014-11-15: 0.2 mg via ORAL

## 2014-11-15 MED ORDER — MIDAZOLAM HCL 2 MG/2ML IJ SOLN
INTRAMUSCULAR | Status: AC
  Filled 2014-11-15: qty 4

## 2014-11-15 MED ORDER — SODIUM CHLORIDE 0.9 % IV SOLN: INTRAVENOUS | Status: DC

## 2014-11-15 MED ORDER — SODIUM CHLORIDE 0.9 % IJ SOLN
INTRAMUSCULAR | Status: DC | PRN
  Administered 2014-11-15: 13:00:00 via INTRA_ARTERIAL

## 2014-11-15 MED ORDER — DIAZEPAM 5 MG PO TABS
ORAL_TABLET | ORAL | Status: AC
  Filled 2014-11-15: qty 1

## 2014-11-15 MED ORDER — DIAZEPAM 5 MG PO TABS
5.0000 mg | ORAL_TABLET | Freq: Once | ORAL | Status: AC
  Administered 2014-11-15: 5 mg via ORAL

## 2014-11-15 MED ORDER — SODIUM CHLORIDE 0.9 % IJ SOLN: 3.0000 mL | Freq: Two times a day (BID) | INTRAMUSCULAR | Status: DC

## 2014-11-15 MED ORDER — SODIUM CHLORIDE 0.9 % IV SOLN: 250.0000 mL | INTRAVENOUS | Status: DC | PRN

## 2014-11-15 MED ORDER — ACETAMINOPHEN 325 MG PO TABS
650.0000 mg | ORAL_TABLET | Freq: Once | ORAL | Status: AC
Start: 2014-11-15 — End: 2014-11-15
  Administered 2014-11-15: 650 mg via ORAL

## 2014-11-15 MED ORDER — ASPIRIN 81 MG PO CHEW
CHEWABLE_TABLET | ORAL | Status: AC
  Filled 2014-11-15: qty 1

## 2014-11-15 MED ORDER — ACETAMINOPHEN 325 MG PO TABS
ORAL_TABLET | ORAL | Status: AC
Start: 2014-11-15 — End: 2014-11-16
  Filled 2014-11-15: qty 2

## 2014-11-15 MED ORDER — SODIUM CHLORIDE 0.9 % IJ SOLN: 3.0000 mL | INTRAMUSCULAR | Status: DC | PRN

## 2014-11-15 MED ORDER — HEPARIN (PORCINE) IN NACL 2-0.9 UNIT/ML-% IJ SOLN
INTRAMUSCULAR | Status: DC | PRN
  Administered 2014-11-15: 14:00:00

## 2014-11-15 MED ORDER — ASPIRIN 81 MG PO CHEW
81.0000 mg | CHEWABLE_TABLET | ORAL | Status: AC
  Administered 2014-11-15: 81 mg via ORAL

## 2014-11-15 MED ORDER — FENTANYL CITRATE (PF) 100 MCG/2ML IJ SOLN
INTRAMUSCULAR | Status: AC
  Filled 2014-11-15: qty 4

## 2014-11-15 MED ORDER — HYDRALAZINE HCL 20 MG/ML IJ SOLN: 20.0000 mg | Freq: Once | INTRAMUSCULAR | Status: DC

## 2014-11-15 MED ORDER — ACETAMINOPHEN 325 MG PO TABS
ORAL_TABLET | ORAL | Status: AC
  Filled 2014-11-15: qty 1

## 2014-11-15 MED ORDER — HEPARIN SODIUM (PORCINE) 1000 UNIT/ML IJ SOLN
INTRAMUSCULAR | Status: DC | PRN
  Administered 2014-11-15: 6000 [IU] via INTRAVENOUS

## 2014-11-15 MED ORDER — SODIUM CHLORIDE 0.9 % IV SOLN
INTRAVENOUS | Status: DC
  Administered 2014-11-15: 10:00:00 via INTRAVENOUS

## 2014-11-15 MED ORDER — MIDAZOLAM HCL 2 MG/2ML IJ SOLN
INTRAMUSCULAR | Status: DC | PRN
  Administered 2014-11-15: 2 mg via INTRAVENOUS

## 2014-11-15 MED ORDER — PROMETHAZINE HCL 25 MG PO TABS: 25.0000 mg | ORAL_TABLET | Freq: Once | ORAL | Status: DC

## 2014-11-15 MED ORDER — LIDOCAINE HCL (PF) 1 % IJ SOLN
INTRAMUSCULAR | Status: AC
  Filled 2014-11-15: qty 30

## 2014-11-15 MED ORDER — IOHEXOL 300 MG/ML  SOLN
INTRAMUSCULAR | Status: DC | PRN
  Administered 2014-11-15: 100 mL via INTRA_ARTERIAL

## 2014-11-15 MED ORDER — FENTANYL CITRATE (PF) 100 MCG/2ML IJ SOLN
INTRAMUSCULAR | Status: DC | PRN
  Administered 2014-11-15: 25 ug via INTRAVENOUS

## 2014-11-15 MED ORDER — HYDRALAZINE HCL 20 MG/ML IJ SOLN
INTRAMUSCULAR | Status: DC | PRN
  Administered 2014-11-15: 20 mg via INTRAVENOUS

## 2014-11-15 SURGICAL SUPPLY — 14 items
CATH INFINITI 5 FR JL3.5 (CATHETERS) ×2 IMPLANT
CATH INFINITI 5FR ANG PIGTAIL (CATHETERS) ×2 IMPLANT
CATH INFINITI JR4 5F (CATHETERS) ×2 IMPLANT
CATH LAUNCHER 5F JL3 (CATHETERS) IMPLANT
CATH OPTITORQUE TIG 4.0 5F (CATHETERS) ×1 IMPLANT
CATHETER LAUNCHER 5F JL3 (CATHETERS) ×2
DEVICE RAD COMP TR BAND LRG (VASCULAR PRODUCTS) ×2 IMPLANT
GLIDESHEATH SLEND SS 6F .021 (SHEATH) ×2 IMPLANT
KIT HEART LEFT (KITS) ×2 IMPLANT
PACK CARDIAC CATHETERIZATION (CUSTOM PROCEDURE TRAY) ×2 IMPLANT
SYR MEDRAD MARK V 150ML (SYRINGE) ×2 IMPLANT
TRANSDUCER W/STOPCOCK (MISCELLANEOUS) ×2 IMPLANT
TUBING CIL FLEX 10 FLL-RA (TUBING) ×2 IMPLANT
WIRE SAFE-T 1.5MM-J .035X260CM (WIRE) ×2 IMPLANT

## 2014-11-15 NOTE — Progress Notes (Signed)
Dr Angelena Form in to see client and client up going to bathroom and breathing labored and O2 sat 100%; notified Dr Angelena Form of B/P and per Dr Angelena Form will not give clonidine at present
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
## 2014-11-15 NOTE — Telephone Encounter (Signed)
Spoke with pt around 8:15 but EPIC went down during documentation.  I told pt she should take AM medications (except Lasix and metformin) with small amount of water.

## 2014-11-15 NOTE — Telephone Encounter (Signed)
New message      Pt is having a cath today.  She has a question about taking her medications without food; and she has a dry mouth----can she have more than a sip of water.  She will not eat candy if she can have more than a sip of water.  Please advise

## 2014-11-15 NOTE — Discharge Instructions (Signed)
Resume metformin on Friday afternoon

## 2014-11-15 NOTE — Progress Notes (Signed)
Transferred via bed to 3-E-02 and report to Tai

## 2014-11-15 NOTE — Progress Notes (Signed)
C/O 8/10 chest pain and states short of breath and Dr Angelena Form notified and orders noted; and med given

## 2014-11-15 NOTE — Research (Signed)
[place] Informed Consent   Subject Name: Sabrina [name]  Subject met inclusion and exclusion criteria.  The informed consent form, study requirements and expectations were reviewed with the subject and questions and concerns were addressed prior to the signing of the consent form.  The subject verbalized understanding of the trail requirements.  The subject agreed to participate in the CADLAB trial and signed the informed consent.  The informed consent was obtained prior to performance of any protocol-specific procedures for the subject.  A copy of the signed informed consent was given to the subject and a copy was placed in the subject's medical record.  Sandie Ano 11/15/2014, 11:00

## 2014-11-15 NOTE — Progress Notes (Signed)
Dr  Angelena Form notified client c/o "chest thumping, headache, and feeling icky"  Orders noted

## 2014-11-15 NOTE — Progress Notes (Signed)
C/O nausea and and states does not want to take anymore pills

## 2014-11-15 NOTE — Progress Notes (Signed)
Called to Short Stay to see patient several times this afternoon for increased anxiety, c/o chest pain dyspnea, headache. Pt states that she feels "icky" with "thumping in chest", also c/o headache. No focal neurological findings. BP stable 140-180. She also has nausea but is refusing Zofran or Phenergan. She does not think she can go home tonight feeling like this. Her cardiac cath today was a simple, uncomplicated procedure. She has normal coronary arteries and normal LV function.   I will admit to telemetry overnight. Resume home meds.   MCALHANY,CHRISTOPHER 11/15/2014 4:19 PM

## 2014-11-15 NOTE — Interval H&P Note (Signed)
History and Physical Interval Note:  11/15/2014 12:47 PM  Sabrina [name]  has presented today for cardiac cath with the diagnosis of unstable angina, abnormal stress myoview  The various methods of treatment have been discussed with the patient and family. After consideration of risks, benefits and other options for treatment, the patient has consented to  Procedure(s): Left Heart Cath and Coronary Angiography (N/A) as a surgical intervention .  The patient's history has been reviewed, patient examined, no change in status, stable for surgery.  I have reviewed the patient's chart and labs.  Questions were answered to the patient's satisfaction.    Cath Lab Visit (complete for each Cath Lab visit)  Clinical Evaluation Leading to the Procedure:   ACS: No.  Non-ACS:    Anginal Classification: CCS III  Anti-ischemic medical therapy: Minimal Therapy (1 class of medications)  Non-Invasive Test Results: Intermediate-risk stress test findings: cardiac mortality 1-3%/year  Prior CABG: No previous CABG         Jerre Vandrunen

## 2014-11-16 ENCOUNTER — Encounter (HOSPITAL_COMMUNITY): Payer: Self-pay | Admitting: Cardiovascular Disease

## 2014-11-16 DIAGNOSIS — F310 Bipolar disorder, current episode hypomanic: Secondary | ICD-10-CM

## 2014-11-16 DIAGNOSIS — E119 Type 2 diabetes mellitus without complications: Secondary | ICD-10-CM

## 2014-11-16 DIAGNOSIS — I209 Angina pectoris, unspecified: Secondary | ICD-10-CM

## 2014-11-16 DIAGNOSIS — E669 Obesity, unspecified: Secondary | ICD-10-CM

## 2014-11-16 LAB — BASIC METABOLIC PANEL
ANION GAP: 7 (ref 5–15)
BUN: 9 mg/dL (ref 6–20)
CO2: 24 mmol/L (ref 22–32)
Calcium: 9.2 mg/dL (ref 8.9–10.3)
Chloride: 107 mmol/L (ref 101–111)
Creatinine, Ser: 0.79 mg/dL (ref 0.44–1.00)
GFR calc Af Amer: >60 mL/min (ref >60)
Glucose, Bld: 126 mg/dL — ABNORMAL HIGH (ref 65–99)
POTASSIUM: 3.3 mmol/L — AB (ref 3.5–5.1)
SODIUM: 138 mmol/L (ref 135–145)

## 2014-11-16 LAB — CBC
HCT: 36.4 % (ref 36.0–46.0)
Hemoglobin: 12.4 g/dL (ref 12.0–15.0)
MCH: 31.6 pg (ref 26.0–34.0)
MCHC: 34.1 g/dL (ref 30.0–36.0)
MCV: 92.6 fL (ref 78.0–100.0)
PLATELETS: 323 10*3/uL (ref 150–400)
RBC: 3.93 MIL/uL (ref 3.87–5.11)
RDW: 14.6 % (ref 11.5–15.5)
WBC: 9.9 10*3/uL (ref 4.0–10.5)

## 2014-11-16 MED ORDER — ACETAMINOPHEN 325 MG PO TABS
650.0000 mg | ORAL_TABLET | ORAL | Status: DC | PRN
  Filled 2014-11-16: qty 2

## 2014-11-16 MED ORDER — OXYBUTYNIN CHLORIDE 5 MG PO TABS
5.0000 mg | ORAL_TABLET | Freq: Two times a day (BID) | ORAL | Status: DC
  Administered 2014-11-16: 5 mg via ORAL
  Filled 2014-11-16: qty 1

## 2014-11-16 MED ORDER — NAPROXEN 500 MG PO TABS
500.0000 mg | ORAL_TABLET | Freq: Every day | ORAL | Status: DC | PRN
  Filled 2014-11-16: qty 1

## 2014-11-16 MED ORDER — DIVALPROEX SODIUM ER 500 MG PO TB24: 1000.0000 mg | ORAL_TABLET | Freq: Every day | ORAL | Status: DC

## 2014-11-16 MED ORDER — ATENOLOL 100 MG PO TABS
100.0000 mg | ORAL_TABLET | Freq: Every morning | ORAL | Status: DC
  Filled 2014-11-16: qty 1

## 2014-11-16 MED ORDER — TRAZODONE HCL 50 MG PO TABS: 25.0000 mg | ORAL_TABLET | Freq: Every day | ORAL | Status: DC | PRN

## 2014-11-16 MED ORDER — PANTOPRAZOLE SODIUM 40 MG PO TBEC
40.0000 mg | DELAYED_RELEASE_TABLET | Freq: Every day | ORAL | Status: DC
  Administered 2014-11-16: 40 mg via ORAL
  Filled 2014-11-16: qty 1

## 2014-11-16 MED ORDER — NITROGLYCERIN 0.4 MG SL SUBL: 0.4000 mg | SUBLINGUAL_TABLET | SUBLINGUAL | Status: DC | PRN

## 2014-11-16 MED ORDER — ASPIRIN EC 81 MG PO TBEC
81.0000 mg | DELAYED_RELEASE_TABLET | Freq: Every day | ORAL | Status: DC
  Administered 2014-11-16: 81 mg via ORAL
  Filled 2014-11-16: qty 1

## 2014-11-16 MED ORDER — ONDANSETRON HCL 4 MG/2ML IJ SOLN: 4.0000 mg | Freq: Four times a day (QID) | INTRAMUSCULAR | Status: DC | PRN

## 2014-11-16 MED ORDER — FUROSEMIDE 20 MG PO TABS
20.0000 mg | ORAL_TABLET | Freq: Every day | ORAL | Status: DC
  Administered 2014-11-16: 20 mg via ORAL
  Filled 2014-11-16: qty 1

## 2014-11-16 MED ORDER — HEPARIN SODIUM (PORCINE) 5000 UNIT/ML IJ SOLN: 5000.0000 [IU] | Freq: Three times a day (TID) | INTRAMUSCULAR | Status: DC

## 2014-11-16 MED ORDER — MUPIROCIN 2 % EX OINT
1.0000 "application " | TOPICAL_OINTMENT | Freq: Two times a day (BID) | CUTANEOUS | Status: DC
  Filled 2014-11-16: qty 22

## 2014-11-16 MED ORDER — PROMETHAZINE HCL 25 MG PO TABS: 25.0000 mg | ORAL_TABLET | Freq: Four times a day (QID) | ORAL | Status: DC | PRN

## 2014-11-16 MED ORDER — METFORMIN HCL 850 MG PO TABS: 850.0000 mg | ORAL_TABLET | Freq: Every morning | ORAL | Status: DC

## 2014-11-16 MED ORDER — CLONIDINE HCL 0.2 MG PO TABS
0.2000 mg | ORAL_TABLET | Freq: Three times a day (TID) | ORAL | Status: DC
  Administered 2014-11-16: 0.2 mg via ORAL
  Filled 2014-11-16 (×2): qty 1

## 2014-11-16 NOTE — Clinical Social Work Note (Signed)
Clinical Social Work Assessment  Patient Details  Name: Sabrina [name] MRN: [id number] Date of Birth: [birth year]  Date of referral:  11/16/14               Reason for consult:  Housing Concerns/Homelessness                Permission sought to share information with:    Permission granted to share information::  No  Name::        Agency::     Relationship::     Contact Information:     Housing/Transportation Living arrangements for the past 2 months:  Barrister's clerk of Information:  Patient Patient Interpreter Needed:  None Criminal Activity/Legal Involvement Pertinent to Current Situation/Hospitalization:  No - Comment as needed Significant Relationships:  Other Family Members Lives with:  Other (Comment) (Shelter) Do you feel safe going back to the place where you live?  Yes Need for family participation in patient care:  No (Coment)  Care giving concerns:  Pt admitted from [facility]  Social Worker assessment / plan:  CSW visited pt room and confirmed with pt she was at [facility] prior to admission and plans to return there today after discharge. Pt reports that her cousin will be coming to [place] around 4pm and will provide transportation back to shelter at this time. CSW did notify pt that should ride not come around that time we may need to look at alternative options. Pt aware that she will not be eligible for a cab voucher but CSW can provide a bus pass if needed. Pt nurse to notify CSW if pt ride is not here by 5pm. At this time, pt has no further hospital social work needs. CSW signing off.  Employment status:  Unemployed Forensic scientist:  Self Pay (Medicaid Pending) PT Recommendations:  Not assessed at this time Information / Referral to community resources:   (None needed)  Patient/Family's Response to care:  Pt plans to return to [employer] at [employer].  Patient/Family's Understanding of and Emotional Response to Diagnosis, Current Treatment, and  Prognosis:  Pt seems to have a good understanding of her medical condition. Pt with appropriate emotional response.   Emotional Assessment Appearance:  Appears stated age, Well-Groomed Attitude/Demeanor/Rapport:  Other (Cooperative) Affect (typically observed):  Calm, Appropriate Orientation:  Oriented to Self, Oriented to Place, Oriented to  Time, Oriented to Situation Alcohol / Substance use:  Not Applicable Psych involvement (Current and /or in the community):  No (Comment)  Discharge Needs  Concerns to be addressed:  Homelessness Readmission within the last 30 days:  No Current discharge risk:  None Barriers to Discharge:  No Barriers Identified   [place], [place]

## 2014-11-16 NOTE — Progress Notes (Signed)
Pt for d/c this afternoon. Ambulating in hallway without difficulty.

## 2014-11-16 NOTE — Discharge Summary (Signed)
Discharge Summary   Patient ID: Sabrina [name] MRN: [id number], DOB/AGE: 53-Nov-1962 53 y.o. Admit date: 11/15/2014 D/C date:     11/16/2014  Primary Cardiologist: Dr. Johnsie Cancel  Principal Problem:   Chest pain Active Problems:   Abnormal nuclear stress test   HTN (hypertension)   HLD (hyperlipidemia)   Bipolar disorder   Diabetes mellitus type 2 in obese   Chlamydia    Admission Dates: 11/15/14-11/16/14 Discharge Diagnosis: chest pain/abnormal myoview s/p LHC with no CAD and normal LV function   HPI: Sabrina [name] is a 54 y.o. female with a history of  DM, HTN, HL, bipolar d/o, homelessness, and chronic LE edema who presented to [facility] on 11/15/14 for outpatient coronary angiography after an abnormal outpatient myoview. After the procedure she c/o chest pain dyspnea, headache and anxiety. Pt stated that she felt "icky" with "thumping in chest," so she was kept overnight for further observation.   She was seen by Dr. Jenkins Rouge at [facility]' in consultation in 07/2014 after Gyn surgery. BP was labile during surgery and Dr. Johnsie Cancel saw her for evaluation of chest pain post op. CEs remained normal. OP myoview was recommended. Myoview was done in 08/2014. This was abnormal with images demonstrating anterior and apical ischemia. She was seen by Richardson Dopp PA-C on 11/09/14 to discuss abnormal stress test and cardiac catheterization was set up for 11/15/14   She is here alone. She lives at the [facility]. She no longer has a car. She struggles with anxiety. She described exertional chest discomfort over the past 6 months associated w/ dyspnea, nausea and diaphoresis. She has chronic dependent LE edema and occasional palpitations. Her palpitations are rapid and last for several minutes.    Hospital Course  Chest pain- she underwent cardiac catheterization on 11/15/14. This was a simple, uncomplicated procedure. She has normal coronary arteries and normal LV function. --  No cardiology follow up required    HTN- continue home meds and follow with PCP  DM- holding metformin for 48 hours post cardiac catheterization.   HA/Anxiety- this is improving. She feels ready to go home today. She does live in a shelter but her cousin can pick her up today. She understands that her heart is healthy and her cardiac catheterization was clean.    The patient has had an uncomplicated hospital course and is recovering well. The radial catheter site is stable. She has been seen by Dr. Tamala Julian today and deemed ready for discharge home. Discharge medications are listed below.   Discharge Vitals: Blood pressure 173/55, pulse 51, temperature 97.9 F (36.6 C), temperature source Oral, resp. rate 18, height 5\' 4"  (1.626 m), weight 261 lb 8 oz (118.616 kg), SpO2 99 %.  Labs: Lab Results  Component Value Date   WBC 9.9 11/16/2014   HGB 12.4 11/16/2014   HCT 36.4 11/16/2014   MCV 92.6 11/16/2014   PLT 323 11/16/2014     Recent Labs Lab 11/10/14 1324  NA 138  K 3.5  CL 101  CO2 30  BUN 11  CREATININE 0.85  CALCIUM 9.5  GLUCOSE 87   No results for input(s): CKTOTAL, CKMB, TROPONINI in the last 72 hours. Lab Results  Component Value Date   CHOL 205* 02/28/2014   HDL 87 02/28/2014   LDLCALC 87 02/28/2014   TRIG 155* 02/28/2014     Diagnostic Studies/Procedures   Myoview 09/19/14  T wave inversion was noted during stress in the inferior and inferolateral leads (II,  III, aVF, V5 and V6).  Defect 1: There is a medium defect of moderate severity present in the basal anterior, mid anterior and apex location.  Findings consistent with ischemia.  This is an intermediate risk study.  The left ventricular ejection fraction is normal (55-65%).  Myocardial perfusion is abnormal.  Sensitivity and specificity of study reduced by breast attenuation.  Echo 03/10/14 Mild LVH, EF 55-60%, mild LAE, PASP 36 mmHg  LHC 11/15/14  Conclusion  The left ventricular systolic  function is normal. 1. No angiographic evidence of CAD 2. Normal LV systolic function Recommendations: No further ischemic workup. Primary care follow up for BP control.   Discharge Medications     Medication List    TAKE these medications        aspirin EC 81 MG tablet  Take 1 tablet (81 mg total) by mouth daily.     atenolol 100 MG tablet  Commonly known as:  TENORMIN  Take 100 mg by mouth every morning.     benzonatate 100 MG capsule  Commonly known as:  TESSALON  Take 1 capsule (100 mg total) by mouth 3 (three) times daily as needed for cough.     cloNIDine 0.2 MG tablet  Commonly known as:  CATAPRES  Take 1 tablet (0.2 mg total) by mouth 3 (three) times daily.     divalproex 500 MG 24 hr tablet  Commonly known as:  DEPAKOTE ER  Take 1,000 mg by mouth at bedtime.     furosemide 20 MG tablet  Commonly known as:  LASIX  Take 1 tablet (20 mg total) by mouth daily.     ibuprofen 600 MG tablet  Commonly known as:  ADVIL,MOTRIN  Take 1 tablet (600 mg total) by mouth every 6 (six) hours as needed.     metFORMIN 850 MG tablet  Commonly known as:  GLUCOPHAGE  Take 1 tablet (850 mg total) by mouth every morning.  Start taking on:  11/18/2014     mupirocin ointment 2 %  Commonly known as:  BACTROBAN  Place 1 application into the nose 2 (two) times daily.     naproxen 500 MG tablet  Commonly known as:  NAPROSYN  Take 500 mg by mouth daily as needed for mild pain, moderate pain or headache.     nitroGLYCERIN 0.4 MG SL tablet  Commonly known as:  NITROSTAT  Place 1 tablet (0.4 mg total) under the tongue every 5 (five) minutes as needed for chest pain.     omeprazole 20 MG capsule  Commonly known as:  PRILOSEC  Take 20 mg by mouth daily as needed (acid reflux).     oxybutynin 5 MG tablet  Commonly known as:  DITROPAN  Take 1 tablet (5 mg total) by mouth 2 (two) times daily.     promethazine 25 MG tablet  Commonly known as:  PHENERGAN  Take 25 mg by mouth every 6  (six) hours as needed for nausea or vomiting.     traZODone 50 MG tablet  Commonly known as:  DESYREL  Take 25 mg by mouth daily as needed for sleep (sleep).        Disposition   The patient will be discharged in stable condition to home.  Follow-up Information    Follow up with Lance Bosch, NP.   Specialty:  Internal Medicine   Why:  Please follow up with PCP to follow your diabetes and high blood pressure   Contact information:   [address]  [place] [address] [phone]         Duration of Discharge Encounter: Greater than 30 minutes including physician and PA time.  Mable Fill R PA-C 11/16/2014, 10:11 AM  ]

## 2014-11-21 ENCOUNTER — Encounter: Payer: No Typology Code available for payment source | Admitting: Pharmacist

## 2014-11-23 ENCOUNTER — Ambulatory Visit: Payer: No Typology Code available for payment source | Attending: Internal Medicine | Admitting: Pharmacist

## 2014-11-23 VITALS — BP 135/88 | HR 51

## 2014-11-23 DIAGNOSIS — I10 Essential (primary) hypertension: Secondary | ICD-10-CM | POA: Insufficient documentation

## 2014-11-23 MED ORDER — LOSARTAN POTASSIUM 50 MG PO TABS: 50.0000 mg | ORAL_TABLET | Freq: Every day | ORAL | Status: DC

## 2014-11-23 NOTE — Patient Instructions (Signed)
Thank you for coming in today Please start Losartan 50 mg by mouth once daily  Please return to clinic in 2 weeks

## 2014-11-23 NOTE — Progress Notes (Signed)
Patient ID: Sabrina [name], female   DOB: November 21, 1960, 54 y.o.   MRN: [id number]   Patient arrives in good spirits. She presents to the clinic for hypertension evaluation.    Patient reports adherence with BP medications. States that misses some of her other medications. Pt states has not been taking ASA 81 mg.   Current BP Medications include: Clonidine 0.2 mg TID, Atenolol 100 mg daily, Furosemide 20 mg daily   Antihypertensives tried in the past include: Lisinopril d/c due to cough   O:   Last 3 Office BP readings: BP Readings from Last 3 Encounters:  11/23/14 135/88  11/16/14 163/80  11/10/14 142/84    BMET    Component Value Date/Time   NA 138 11/16/2014 0835   K 3.3* 11/16/2014 0835   CL 107 11/16/2014 0835   CO2 24 11/16/2014 0835   GLUCOSE 126* 11/16/2014 0835   BUN 9 11/16/2014 0835   CREATININE 0.79 11/16/2014 0835   CREATININE 0.88 08/07/2014 1522   CALCIUM 9.2 11/16/2014 0835   GFRNONAA >60 11/16/2014 0835   GFRAA >60 11/16/2014 0835   10 year ASCVD risk with BP 135/88: 6.7% 10 year ASCVD risk with BP 163/80: 12.5%  A/P:  History of which is currently controlled on current medications. Will add low dose Losartan 50 mg daily for renal protection with diabetes.  Blood pressure should be able to tolerate and patient has had borderline low potassium in the past so this should help to keep it normal. Will f/u BMET in 2 weeks for electrolytes/renal function. Counseled pt on possible side effects including hypotension and angioedema. Pt states she is taking naproxen very rarely. Counseled pt to avoid NSAIDs while taking losartan to avoid kidney injury.   Instructed pt to continue ASA 81 mg daily and counseled on the decreased risk of ASCVD with ASA and the importance of adherence with her medications.  Will consider statin at next visit for ASCVD risk reduction with diabetes.   Pt requested refill of tessalon and note for elevator use at the homeless shelter due to CV  conditions.  Pt's only CV condition is HTN and tessalon was prescribed for possible URI on 7/5.  Instructed pt to follow-up on both issues with Chari Manning, NP for her next scheduled visit on 11/27/14.    Results reviewed and written information provided.   F/U Clinic Visit with pharmacist in 2 weeks.  Total time in face-to-face counseling 30 minutes.  Patient seen with Bennye Alm, PharmD, Pharmacy Resident

## 2014-11-27 ENCOUNTER — Ambulatory Visit: Payer: No Typology Code available for payment source | Attending: Internal Medicine | Admitting: Internal Medicine

## 2014-11-27 ENCOUNTER — Encounter: Payer: Self-pay | Admitting: Internal Medicine

## 2014-11-27 VITALS — BP 148/81 | HR 61 | Temp 98.0°F | Resp 16 | Ht 64.0 in | Wt 262.2 lb

## 2014-11-27 DIAGNOSIS — K5900 Constipation, unspecified: Secondary | ICD-10-CM

## 2014-11-27 DIAGNOSIS — E669 Obesity, unspecified: Secondary | ICD-10-CM

## 2014-11-27 DIAGNOSIS — R682 Dry mouth, unspecified: Secondary | ICD-10-CM

## 2014-11-27 DIAGNOSIS — E1169 Type 2 diabetes mellitus with other specified complication: Secondary | ICD-10-CM

## 2014-11-27 DIAGNOSIS — E119 Type 2 diabetes mellitus without complications: Secondary | ICD-10-CM

## 2014-11-27 LAB — GLUCOSE, POCT (MANUAL RESULT ENTRY): POC Glucose: 105 mg/dl — AB (ref 70–99)

## 2014-11-27 NOTE — Progress Notes (Signed)
Patient here for follow up Complains of having a dry mouth Some constipation with black tarry stools and a queasy feeling in  Her stomach

## 2014-11-27 NOTE — Patient Instructions (Signed)
Drink plenty of water to help with dry mouth. If you suffer from occasional constipation may try a over the counter stool softener.   Call me when you get the hospital discount and I will place order for colonoscopy.
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
## 2014-11-29 ENCOUNTER — Ambulatory Visit (INDEPENDENT_AMBULATORY_CARE_PROVIDER_SITE_OTHER): Payer: No Typology Code available for payment source | Admitting: Physician Assistant

## 2014-11-29 ENCOUNTER — Encounter: Payer: Self-pay | Admitting: Physician Assistant

## 2014-11-29 VITALS — BP 140/78 | HR 54 | Ht 64.0 in | Wt 264.8 lb

## 2014-11-29 DIAGNOSIS — R0683 Snoring: Secondary | ICD-10-CM

## 2014-11-29 DIAGNOSIS — Z9889 Other specified postprocedural states: Secondary | ICD-10-CM

## 2014-11-29 DIAGNOSIS — R079 Chest pain, unspecified: Secondary | ICD-10-CM

## 2014-11-29 DIAGNOSIS — I10 Essential (primary) hypertension: Secondary | ICD-10-CM

## 2014-11-29 NOTE — Patient Instructions (Signed)
Medication Instructions:  Your physician recommends that you continue on your current medications as directed. Please refer to the Current Medication list given to you today.  Labwork: NONE  Testing/Procedures: NONE  Follow-Up: Your physician recommends that you schedule a follow-up appointment as needed with your Cardiologist.   Any Other Special Instructions Will Be Listed Below (If Applicable).
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
## 2014-11-30 ENCOUNTER — Encounter: Payer: No Typology Code available for payment source | Admitting: Cardiovascular Disease

## 2014-12-05 ENCOUNTER — Ambulatory Visit: Payer: No Typology Code available for payment source | Attending: Internal Medicine

## 2014-12-05 ENCOUNTER — Other Ambulatory Visit: Payer: Self-pay | Admitting: Internal Medicine

## 2014-12-06 ENCOUNTER — Encounter: Payer: Self-pay | Admitting: Cardiovascular Disease

## 2014-12-07 ENCOUNTER — Ambulatory Visit: Payer: No Typology Code available for payment source | Attending: Internal Medicine | Admitting: Pharmacist

## 2014-12-07 ENCOUNTER — Other Ambulatory Visit: Payer: Self-pay | Admitting: Internal Medicine

## 2014-12-07 ENCOUNTER — Encounter: Payer: Self-pay | Admitting: Pharmacist

## 2014-12-07 VITALS — BP 138/86 | Wt 263.0 lb

## 2014-12-07 DIAGNOSIS — E1165 Type 2 diabetes mellitus with hyperglycemia: Secondary | ICD-10-CM

## 2014-12-07 DIAGNOSIS — I10 Essential (primary) hypertension: Secondary | ICD-10-CM

## 2014-12-07 LAB — BASIC METABOLIC PANEL
BUN: 10 mg/dL (ref 7–25)
CALCIUM: 9.3 mg/dL (ref 8.6–10.4)
CO2: 26 mmol/L (ref 20–31)
CREATININE: 0.69 mg/dL (ref 0.50–1.05)
Chloride: 105 mmol/L (ref 98–110)
GLUCOSE: 97 mg/dL (ref 65–99)
Potassium: 4.6 mmol/L (ref 3.5–5.3)
SODIUM: 140 mmol/L (ref 135–146)

## 2014-12-07 MED ORDER — METFORMIN HCL 850 MG PO TABS
850.0000 mg | ORAL_TABLET | Freq: Every morning | ORAL | Status: DC
Start: 2014-12-07 — End: 2015-05-21

## 2014-12-07 NOTE — Patient Instructions (Signed)
It was great to see you today!  I know you are having to deal with a lot a stress but I think your positive attitude is really amazing!  Keep taking your medications as directed.  We will call you with your lab work.  Come back and see [place] as needed.

## 2014-12-07 NOTE — Progress Notes (Signed)
S:    Patient arrives talkative and in good spirits. She seems to have a dry, hacking cough and states it has been present for the past couple of months. Patient also expresses some concern over her slight weight gain today. She presents to the clinic for hypertension evaluation.  Diagnosed with Hypertension in the year of 2008.    Patient reports adherence with medications. However, she continues to skip her furosemide dose on Wednesdays when she is working so that she doesn't have to urinate as much.   Current BP Medications include:  Losartan 50mg  daily, atenolol 100mg  daily, and clonidine 0.2mg  TID  Antihypertensives tried in the past include: do not see lisinopril on her med history but has a listed intolerance to it.  Patient continues to report significant stress in trying to find a job and from living in the shelter. She is very frustrated with her situation but is trying to find ways to remain positive.  Unfortunately diet is limited to what she is provided with at the shelter. She tries to avoid pork but otherwise has to eat what she is provided with.    O:   Last 3 Office BP readings: BP Readings from Last 3 Encounters:  12/07/14 138/86  11/29/14 140/78  11/27/14 148/81    BMET    Component Value Date/Time   NA 138 11/16/2014 0835   K 3.3* 11/16/2014 0835   CL 107 11/16/2014 0835   CO2 24 11/16/2014 0835   GLUCOSE 126* 11/16/2014 0835   BUN 9 11/16/2014 0835   CREATININE 0.79 11/16/2014 0835   CREATININE 0.88 08/07/2014 1522   CALCIUM 9.2 11/16/2014 0835   GFRNONAA >60 11/16/2014 0835   GFRAA >60 11/16/2014 0835    A/P:  History of hypertension since 2008 which currently is controlled on current medications. Patient had a BMP  Drawn last month with a potassium level of 3.3 however, the specimen was hemolyzed. Losartan was started at previous visit for blood pressure control and to aid in boosting her potassium. BP controlled today at 138/86 - I expect it is lower  out of the office when she isn't talking about stressful situations. I do not think the cough is related to losartan as this occurred prior to initiation of the medication. Will continue her on her current regimen and draw an additional BMP for confirmation of previous results. Will make any changes if needed once the lab results are received.     Results reviewed and written information provided.   F/U PRN or with Chari Manning in regard to cough.  Total time in face-to-face counseling 35 minutes.  Patient seen with Stephens November, PharmD Resident.

## 2014-12-14 ENCOUNTER — Encounter (HOSPITAL_COMMUNITY): Payer: Self-pay | Admitting: Emergency Medicine

## 2014-12-14 ENCOUNTER — Emergency Department (HOSPITAL_COMMUNITY)
Admission: EM | Admit: 2014-12-14 | Discharge: 2014-12-14 | Disposition: A | Discharge status: Home / Self Care | Payer: No Typology Code available for payment source | Attending: Emergency Medicine | Admitting: Emergency Medicine

## 2014-12-14 DIAGNOSIS — E119 Type 2 diabetes mellitus without complications: Secondary | ICD-10-CM | POA: Insufficient documentation

## 2014-12-14 DIAGNOSIS — S8992XA Unspecified injury of left lower leg, initial encounter: Secondary | ICD-10-CM | POA: Insufficient documentation

## 2014-12-14 DIAGNOSIS — W19XXXA Unspecified fall, initial encounter: Secondary | ICD-10-CM

## 2014-12-14 DIAGNOSIS — Y9289 Other specified places as the place of occurrence of the external cause: Secondary | ICD-10-CM | POA: Insufficient documentation

## 2014-12-14 DIAGNOSIS — G8929 Other chronic pain: Secondary | ICD-10-CM | POA: Insufficient documentation

## 2014-12-14 DIAGNOSIS — F419 Anxiety disorder, unspecified: Secondary | ICD-10-CM | POA: Insufficient documentation

## 2014-12-14 DIAGNOSIS — Z7982 Long term (current) use of aspirin: Secondary | ICD-10-CM | POA: Insufficient documentation

## 2014-12-14 DIAGNOSIS — M79605 Pain in left leg: Secondary | ICD-10-CM

## 2014-12-14 DIAGNOSIS — Z880 Allergy status to penicillin: Secondary | ICD-10-CM | POA: Insufficient documentation

## 2014-12-14 DIAGNOSIS — I10 Essential (primary) hypertension: Secondary | ICD-10-CM | POA: Insufficient documentation

## 2014-12-14 DIAGNOSIS — W1839XA Other fall on same level, initial encounter: Secondary | ICD-10-CM | POA: Insufficient documentation

## 2014-12-14 DIAGNOSIS — E785 Hyperlipidemia, unspecified: Secondary | ICD-10-CM | POA: Insufficient documentation

## 2014-12-14 DIAGNOSIS — Z79899 Other long term (current) drug therapy: Secondary | ICD-10-CM | POA: Insufficient documentation

## 2014-12-14 DIAGNOSIS — M79604 Pain in right leg: Secondary | ICD-10-CM

## 2014-12-14 DIAGNOSIS — K219 Gastro-esophageal reflux disease without esophagitis: Secondary | ICD-10-CM | POA: Insufficient documentation

## 2014-12-14 DIAGNOSIS — G43909 Migraine, unspecified, not intractable, without status migrainosus: Secondary | ICD-10-CM | POA: Insufficient documentation

## 2014-12-14 DIAGNOSIS — F319 Bipolar disorder, unspecified: Secondary | ICD-10-CM | POA: Insufficient documentation

## 2014-12-14 DIAGNOSIS — Y998 Other external cause status: Secondary | ICD-10-CM | POA: Insufficient documentation

## 2014-12-14 DIAGNOSIS — S8991XA Unspecified injury of right lower leg, initial encounter: Secondary | ICD-10-CM | POA: Insufficient documentation

## 2014-12-14 DIAGNOSIS — Y9389 Activity, other specified: Secondary | ICD-10-CM | POA: Insufficient documentation

## 2014-12-14 NOTE — ED Notes (Signed)
Pt sts fall on Monday with leg pain; pt sts continued pain so wanted to have checked; pt sts lymphedema per norm and did not take htn meds this am

## 2014-12-14 NOTE — ED Notes (Signed)
Pt stable, ambulatory, states understanding of discharge instructions 
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
## 2014-12-14 NOTE — Discharge Instructions (Signed)
Naprosyn for pain and inflammation. Tylenol for additional pain relief. Must elevate legs as much as able. Avoid any increased/strenuous activity. Restricted for 5-7 days. Follow up with primary care doctor as needed.   Muscle Strain A muscle strain is an injury that occurs when a muscle is stretched beyond its normal length. Usually a small number of muscle fibers are torn when this happens. Muscle strain is rated in degrees. First-degree strains have the least amount of muscle fiber tearing and pain. Second-degree and third-degree strains have increasingly more tearing and pain.  Usually, recovery from muscle strain takes 1-2 weeks. Complete healing takes 5-6 weeks.  CAUSES  Muscle strain happens when a sudden, violent force placed on a muscle stretches it too far. This may occur with lifting, sports, or a fall.  RISK FACTORS Muscle strain is especially common in athletes.  SIGNS AND SYMPTOMS At the site of the muscle strain, there may be:  Pain.  Bruising.  Swelling.  Difficulty using the muscle due to pain or lack of normal function. DIAGNOSIS  Your health care provider will perform a physical exam and ask about your medical history. TREATMENT  Often, the best treatment for a muscle strain is resting, icing, and applying cold compresses to the injured area.  HOME CARE INSTRUCTIONS   Use the PRICE method of treatment to promote muscle healing during the first 2-3 days after your injury. The PRICE method involves:  Protecting the muscle from being injured again.  Restricting your activity and resting the injured body part.  Icing your injury. To do this, put ice in a plastic bag. Place a towel between your skin and the bag. Then, apply the ice and leave it on from 15-20 minutes each hour. After the third day, switch to moist heat packs.  Apply compression to the injured area with a splint or elastic bandage. Be careful not to wrap it too tightly. This may interfere with blood  circulation or increase swelling.  Elevate the injured body part above the level of your heart as often as you can.  Only take over-the-counter or prescription medicines for pain, discomfort, or fever as directed by your health care provider.  Warming up prior to exercise helps to prevent future muscle strains. SEEK MEDICAL CARE IF:   You have increasing pain or swelling in the injured area.  You have numbness, tingling, or a significant loss of strength in the injured area. MAKE SURE YOU:   Understand these instructions.  Will watch your condition.  Will get help right away if you are not doing well or get worse. Document Released: 03/17/2005 Document Revised: 01/05/2013 Document Reviewed: 10/14/2012 [facility] Patient Information 2015 [place], [place]. This information is not intended to replace advice given to you by your health care provider. Make sure you discuss any questions you have with your health care provider.

## 2014-12-26 ENCOUNTER — Encounter: Payer: Self-pay | Admitting: Physician Assistant

## 2014-12-26 ENCOUNTER — Ambulatory Visit (INDEPENDENT_AMBULATORY_CARE_PROVIDER_SITE_OTHER): Payer: No Typology Code available for payment source | Admitting: Physician Assistant

## 2014-12-26 VITALS — BP 160/90 | HR 52 | Ht 64.0 in | Wt 262.0 lb

## 2014-12-26 DIAGNOSIS — R931 Abnormal findings on diagnostic imaging of heart and coronary circulation: Secondary | ICD-10-CM

## 2014-12-26 DIAGNOSIS — R9439 Abnormal result of other cardiovascular function study: Secondary | ICD-10-CM

## 2014-12-26 DIAGNOSIS — Z5900 Homelessness unspecified: Secondary | ICD-10-CM

## 2014-12-26 DIAGNOSIS — I10 Essential (primary) hypertension: Secondary | ICD-10-CM

## 2014-12-26 NOTE — Assessment & Plan Note (Signed)
Patient's blood pressure is elevated today. She did not take her losartan because she only had one to left. She needs to go get it filled. She also had 2 hot dogs for lunch. It's difficult living in a homeless shelter and trying to eat a low-salt diet. Recommend 2 g sodium diet. Continue to take her medications on a regular basis. Follow-up with primary care

## 2014-12-26 NOTE — Assessment & Plan Note (Signed)
Follow-up cardiac cath showed normal coronary arteries and LV function
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
## 2014-12-26 NOTE — Patient Instructions (Signed)
Medication Instructions:  Your physician recommends that you continue on your current medications as directed. Please refer to the Current Medication list given to you today.  Labwork: NONE  Testing/Procedures: NONE  Follow-Up: Your physician recommends that you schedule a follow-up appointment with your primary care doctor.

## 2014-12-26 NOTE — Assessment & Plan Note (Signed)
This is a major obstacle in her taking care of herself. She is trying to find more work so she can live on her own and cook for herself.

## 2015-01-01 ENCOUNTER — Other Ambulatory Visit: Payer: Self-pay | Admitting: Internal Medicine

## 2015-01-15 ENCOUNTER — Other Ambulatory Visit: Payer: Self-pay | Admitting: Family Medicine

## 2015-01-23 ENCOUNTER — Telehealth: Payer: Self-pay

## 2015-01-23 NOTE — Telephone Encounter (Signed)
Nurse called patient, reached voicemail. Left message for patient to call Sabrina [name] with [facility], at [phone].  

## 2015-01-23 NOTE — Telephone Encounter (Signed)
-----   Message from Sabrina Garter, MD sent at 12/15/2014  9:42 AM EDT ----- Please inform patient that her lab result is normal including her potassium level.

## 2015-01-26 ENCOUNTER — Other Ambulatory Visit: Payer: Self-pay | Admitting: Internal Medicine

## 2015-01-29 NOTE — Telephone Encounter (Signed)
CMA called pt, pt didn't answer. Pt will be sent out a letter with lab results as this the 2nd attempt to reach patient.

## 2015-02-09 ENCOUNTER — Ambulatory Visit: Payer: No Typology Code available for payment source | Admitting: Cardiovascular Disease

## 2015-02-28 ENCOUNTER — Encounter: Payer: Self-pay | Admitting: Internal Medicine

## 2015-02-28 ENCOUNTER — Ambulatory Visit: Payer: No Typology Code available for payment source | Attending: Internal Medicine | Admitting: Internal Medicine

## 2015-02-28 VITALS — BP 134/80 | HR 58 | Temp 98.0°F | Resp 16 | Ht 64.0 in | Wt 256.0 lb

## 2015-02-28 DIAGNOSIS — E119 Type 2 diabetes mellitus without complications: Secondary | ICD-10-CM

## 2015-02-28 DIAGNOSIS — M79675 Pain in left toe(s): Secondary | ICD-10-CM

## 2015-02-28 DIAGNOSIS — E1169 Type 2 diabetes mellitus with other specified complication: Secondary | ICD-10-CM

## 2015-02-28 DIAGNOSIS — E669 Obesity, unspecified: Secondary | ICD-10-CM

## 2015-02-28 DIAGNOSIS — L84 Corns and callosities: Secondary | ICD-10-CM

## 2015-02-28 DIAGNOSIS — M79674 Pain in right toe(s): Secondary | ICD-10-CM

## 2015-02-28 LAB — URIC ACID: URIC ACID, SERUM: 6.8 mg/dL (ref 2.4–7.0)

## 2015-02-28 LAB — BASIC METABOLIC PANEL
BUN: 14 mg/dL (ref 7–25)
CHLORIDE: 105 mmol/L (ref 98–110)
CO2: 26 mmol/L (ref 20–31)
Calcium: 9.1 mg/dL (ref 8.6–10.4)
Creat: 0.64 mg/dL (ref 0.50–1.05)
Glucose, Bld: 88 mg/dL (ref 65–99)
POTASSIUM: 4.1 mmol/L (ref 3.5–5.3)
Sodium: 139 mmol/L (ref 135–146)

## 2015-02-28 LAB — GLUCOSE, POCT (MANUAL RESULT ENTRY): POC Glucose: 112 mg/dl — AB (ref 70–99)

## 2015-02-28 LAB — POCT GLYCOSYLATED HEMOGLOBIN (HGB A1C): HEMOGLOBIN A1C: 5.6

## 2015-02-28 NOTE — Patient Instructions (Signed)
Someone will call you with a date for the podiatry clinic

## 2015-02-28 NOTE — Progress Notes (Signed)
Patient ID: Sabrina [name], female   DOB: December 15, 1960, 54 y.o.   MRN: [id number] SUBJECTIVE: 54 y.o. female for follow up of diabetes and toe pain/. She has a past medical history of bipolar disorder, homelessness, and hypertension. Diabetic Review of Systems - medication compliance: compliant all of the time, diabetic diet compliance: noncompliant some of the time, further diabetic ROS: no polyuria or polydipsia, no chest pain, dyspnea or TIA's, no unusual visual symptoms, no hypoglycemia.  Other symptoms and concerns: Patient complains of pain in her right and left great toe for 2 months after getting a pedicure at a nail salon. She states that the pain is sharp and shooting.   Current Outpatient Prescriptions  Medication Sig Dispense Refill  . aspirin EC 81 MG tablet Take 1 tablet (81 mg total) by mouth daily.    Marland Kitchen atenolol (TENORMIN) 100 MG tablet Take 100 mg by mouth every morning.    Marland Kitchen atenolol (TENORMIN) 50 MG tablet TAKE 2 TABLETS BY MOUTH DAILY 180 tablet 2  . cloNIDine (CATAPRES) 0.2 MG tablet Take 1 tablet (0.2 mg total) by mouth 3 (three) times daily. 90 tablet 3  . divalproex (DEPAKOTE ER) 500 MG 24 hr tablet Take 1,000 mg by mouth at bedtime.     . furosemide (LASIX) 20 MG tablet TAKE 1 TABLET BY MOUTH DAILY. 30 tablet 2  . ibuprofen (ADVIL,MOTRIN) 600 MG tablet Take 1 tablet (600 mg total) by mouth every 6 (six) hours as needed. 30 tablet 0  . losartan (COZAAR) 50 MG tablet Take 1 tablet (50 mg total) by mouth daily. 90 tablet 3  . metFORMIN (GLUCOPHAGE) 850 MG tablet Take 1 tablet (850 mg total) by mouth every morning. 30 tablet 4  . naproxen (NAPROSYN) 500 MG tablet Take 500 mg by mouth daily as needed for mild pain, moderate pain or headache.    . nitroGLYCERIN (NITROSTAT) 0.4 MG SL tablet Place 1 tablet (0.4 mg total) under the tongue every 5 (five) minutes as needed for chest pain. 25 tablet 3  . omeprazole (PRILOSEC) 20 MG capsule Take 20 mg by mouth daily as needed (acid reflux).     Marland Kitchen oxybutynin (DITROPAN) 5 MG tablet TAKE 1 TABLET BY MOUTH TWICE DAILY. 60 tablet 2  . traZODone (DESYREL) 50 MG tablet Take 25 mg by mouth daily as needed for sleep (sleep).     Marland Kitchen omeprazole (PRILOSEC) 20 MG capsule TAKE ONE CAPSULE BY MOUTH EVERY MORNING (Patient not taking: Reported on 02/28/2015) 30 capsule 5  . promethazine (PHENERGAN) 25 MG tablet Take 25 mg by mouth every 6 (six) hours as needed for nausea or vomiting.     No current facility-administered medications for this visit.    OBJECTIVE: Appearance: alert, well appearing, and in no distress, oriented to person, place, and time and overweight. BP 134/80 mmHg  Pulse 58  Temp(Src) 98 F (36.7 C) (Oral)  Resp 16  Ht 5\' 4"  (1.626 m)  Wt 256 lb (116.121 kg)  BMI 43.92 kg/m2  SpO2 98%  Exam: heart sounds normal rate, regular rhythm, normal S1, S2, no murmurs, rubs, clicks or gallops, no JVD, chest clear, no hepatosplenomegaly, no carotid bruits, feet: warm, good capillary refill, no trophic changes or ulcerative lesions, normal DP and PT pulses, normal monofilament exam, normal sensory exam and callus.  ASSESSMENT: Sabrina [name] was seen today for toe pain.  Diagnoses and all orders for this visit:  Toe pain, bilateral -     Uric Acid I will check for  gout. Does not appear to be paronychia.   Callus of foot -     Ambulatory referral to Podiatry Will refer to podiatry, needs foot care  Diabetes mellitus type 2 in obese ([place]) -     POCT glycosylated hemoglobin (Hb A1C) -     POCT glucose (manual entry) -     Vitamin D, 25-hydroxy -     Basic Metabolic Panel Patients diabetes is well control as evidence by consistently low a1c.  Patient will continue with current therapy and continue to make necessary lifestyle changes.  Reviewed foot care, diet, exercise, annual health maintenance with patient.    Return in about 3 months (around 05/29/2015) for Diabetes Mellitus.   Lance Bosch, NP 02/28/2015 6:18 PM

## 2015-02-28 NOTE — Addendum Note (Signed)
Addended by: Chari Manning A on: 02/28/2015 06:18 PM   Modules accepted: Level of Service

## 2015-02-28 NOTE — Progress Notes (Signed)
C/C swelling on ankles and rt and lt toe  F/U DM  Pain scale #3 No tobacco user  No suicide thought in the past two weeks

## 2015-03-01 LAB — VITAMIN D 25 HYDROXY (VIT D DEFICIENCY, FRACTURES): VIT D 25 HYDROXY: 14 ng/mL — AB (ref 30–100)

## 2015-03-02 ENCOUNTER — Telehealth: Payer: Self-pay

## 2015-03-02 MED ORDER — VITAMIN D (ERGOCALCIFEROL) 1.25 MG (50000 UNIT) PO CAPS: 50000.0000 [IU] | ORAL_CAPSULE | ORAL | Status: DC

## 2015-03-02 NOTE — Telephone Encounter (Signed)
-----   Message from Lance Bosch, NP sent at 03/01/2015  8:33 AM EST ----- Vitamin D is low. Please send drisdol 50,000 IU to take once weekly for 12 weeks. 12 tablets no refills. This may contribute to fatigue.  All other labs normal

## 2015-03-02 NOTE — Telephone Encounter (Signed)
Attempted to contact patient  Patient not available Left message on voice mail to return our call

## 2015-03-19 ENCOUNTER — Other Ambulatory Visit: Payer: Self-pay | Admitting: Internal Medicine

## 2015-03-20 ENCOUNTER — Encounter (HOSPITAL_COMMUNITY): Payer: Self-pay

## 2015-03-20 ENCOUNTER — Telehealth: Payer: Self-pay

## 2015-03-20 ENCOUNTER — Emergency Department (HOSPITAL_COMMUNITY): Payer: No Typology Code available for payment source

## 2015-03-20 ENCOUNTER — Emergency Department (HOSPITAL_COMMUNITY): Payer: Self-pay

## 2015-03-20 ENCOUNTER — Ambulatory Visit (INDEPENDENT_AMBULATORY_CARE_PROVIDER_SITE_OTHER): Payer: No Typology Code available for payment source | Admitting: Cardiovascular Disease

## 2015-03-20 ENCOUNTER — Emergency Department (HOSPITAL_COMMUNITY)
Admission: EM | Admit: 2015-03-20 | Discharge: 2015-03-20 | Disposition: A | Discharge status: Home / Self Care | Payer: Self-pay | Attending: Emergency Medicine | Admitting: Emergency Medicine

## 2015-03-20 DIAGNOSIS — I10 Essential (primary) hypertension: Secondary | ICD-10-CM | POA: Insufficient documentation

## 2015-03-20 DIAGNOSIS — F8081 Childhood onset fluency disorder: Secondary | ICD-10-CM | POA: Insufficient documentation

## 2015-03-20 DIAGNOSIS — Z8709 Personal history of other diseases of the respiratory system: Secondary | ICD-10-CM | POA: Insufficient documentation

## 2015-03-20 DIAGNOSIS — M069 Rheumatoid arthritis, unspecified: Secondary | ICD-10-CM | POA: Insufficient documentation

## 2015-03-20 DIAGNOSIS — E785 Hyperlipidemia, unspecified: Secondary | ICD-10-CM

## 2015-03-20 DIAGNOSIS — E119 Type 2 diabetes mellitus without complications: Secondary | ICD-10-CM | POA: Insufficient documentation

## 2015-03-20 DIAGNOSIS — R0989 Other specified symptoms and signs involving the circulatory and respiratory systems: Secondary | ICD-10-CM

## 2015-03-20 DIAGNOSIS — Z7982 Long term (current) use of aspirin: Secondary | ICD-10-CM | POA: Insufficient documentation

## 2015-03-20 DIAGNOSIS — K219 Gastro-esophageal reflux disease without esophagitis: Secondary | ICD-10-CM | POA: Insufficient documentation

## 2015-03-20 DIAGNOSIS — F319 Bipolar disorder, unspecified: Secondary | ICD-10-CM | POA: Insufficient documentation

## 2015-03-20 DIAGNOSIS — Z880 Allergy status to penicillin: Secondary | ICD-10-CM | POA: Insufficient documentation

## 2015-03-20 DIAGNOSIS — I639 Cerebral infarction, unspecified: Secondary | ICD-10-CM

## 2015-03-20 DIAGNOSIS — M159 Polyosteoarthritis, unspecified: Secondary | ICD-10-CM | POA: Insufficient documentation

## 2015-03-20 DIAGNOSIS — F419 Anxiety disorder, unspecified: Secondary | ICD-10-CM | POA: Insufficient documentation

## 2015-03-20 DIAGNOSIS — R4781 Slurred speech: Secondary | ICD-10-CM

## 2015-03-20 DIAGNOSIS — R51 Headache: Secondary | ICD-10-CM

## 2015-03-20 DIAGNOSIS — M109 Gout, unspecified: Secondary | ICD-10-CM | POA: Insufficient documentation

## 2015-03-20 DIAGNOSIS — G8929 Other chronic pain: Secondary | ICD-10-CM | POA: Insufficient documentation

## 2015-03-20 DIAGNOSIS — G43909 Migraine, unspecified, not intractable, without status migrainosus: Secondary | ICD-10-CM | POA: Insufficient documentation

## 2015-03-20 DIAGNOSIS — Z7984 Long term (current) use of oral hypoglycemic drugs: Secondary | ICD-10-CM | POA: Insufficient documentation

## 2015-03-20 DIAGNOSIS — Z79899 Other long term (current) drug therapy: Secondary | ICD-10-CM | POA: Insufficient documentation

## 2015-03-20 DIAGNOSIS — R519 Headache, unspecified: Secondary | ICD-10-CM

## 2015-03-20 DIAGNOSIS — Z9889 Other specified postprocedural states: Secondary | ICD-10-CM | POA: Insufficient documentation

## 2015-03-20 LAB — COMPREHENSIVE METABOLIC PANEL
ALBUMIN: 3.7 g/dL (ref 3.5–5.0)
ALT: 36 U/L (ref 14–54)
ANION GAP: 7 (ref 5–15)
AST: 40 U/L (ref 15–41)
Alkaline Phosphatase: 75 U/L (ref 38–126)
BUN: 9 mg/dL (ref 6–20)
CO2: 27 mmol/L (ref 22–32)
Calcium: 9.5 mg/dL (ref 8.9–10.3)
Chloride: 107 mmol/L (ref 101–111)
Creatinine, Ser: 0.75 mg/dL (ref 0.44–1.00)
GFR calc Af Amer: >60 mL/min (ref >60)
GFR calc non Af Amer: >60 mL/min (ref >60)
GLUCOSE: 84 mg/dL (ref 65–99)
POTASSIUM: 4 mmol/L (ref 3.5–5.1)
SODIUM: 141 mmol/L (ref 135–145)
Total Bilirubin: 0.5 mg/dL (ref 0.3–1.2)
Total Protein: 7.7 g/dL (ref 6.5–8.1)

## 2015-03-20 LAB — CBC WITH DIFFERENTIAL/PLATELET
BASOS ABS: 0 10*3/uL (ref 0.0–0.1)
BASOS PCT: 0 %
Eosinophils Absolute: 0 10*3/uL (ref 0.0–0.7)
Eosinophils Relative: 1 %
HCT: 39.2 % (ref 36.0–46.0)
HEMOGLOBIN: 12.9 g/dL (ref 12.0–15.0)
LYMPHS PCT: 41 %
Lymphs Abs: 3.2 10*3/uL (ref 0.7–4.0)
MCH: 31.6 pg (ref 26.0–34.0)
MCHC: 32.9 g/dL (ref 30.0–36.0)
MCV: 96.1 fL (ref 78.0–100.0)
MONOS PCT: 8 %
Monocytes Absolute: 0.6 10*3/uL (ref 0.1–1.0)
NEUTROS ABS: 3.8 10*3/uL (ref 1.7–7.7)
NEUTROS PCT: 50 %
PLATELETS: 290 10*3/uL (ref 150–400)
RBC: 4.08 MIL/uL (ref 3.87–5.11)
RDW: 14 % (ref 11.5–15.5)
WBC: 7.6 10*3/uL (ref 4.0–10.5)

## 2015-03-20 LAB — URINALYSIS, ROUTINE W REFLEX MICROSCOPIC
Bilirubin Urine: NEGATIVE
GLUCOSE, UA: NEGATIVE mg/dL
Hgb urine dipstick: NEGATIVE
Ketones, ur: NEGATIVE mg/dL
LEUKOCYTES UA: NEGATIVE
Nitrite: NEGATIVE
PH: 7 (ref 5.0–8.0)
PROTEIN: NEGATIVE mg/dL
Specific Gravity, Urine: 1.005 (ref 1.005–1.030)

## 2015-03-20 LAB — PROTIME-INR
INR: 1.07 (ref 0.00–1.49)
PROTHROMBIN TIME: 14.1 s (ref 11.6–15.2)

## 2015-03-20 LAB — I-STAT TROPONIN, ED
TROPONIN I, POC: 0 ng/mL (ref 0.00–0.08)
Troponin i, poc: 0 ng/mL (ref 0.00–0.08)

## 2015-03-20 LAB — RAPID URINE DRUG SCREEN, HOSP PERFORMED
Amphetamines: NOT DETECTED
BARBITURATES: NOT DETECTED
BENZODIAZEPINES: NOT DETECTED
Cocaine: NOT DETECTED
Opiates: NOT DETECTED
TETRAHYDROCANNABINOL: NOT DETECTED

## 2015-03-20 LAB — APTT: APTT: 34 s (ref 24–37)

## 2015-03-20 LAB — ETHANOL

## 2015-03-20 MED ORDER — LORAZEPAM 2 MG/ML IJ SOLN
1.0000 mg | Freq: Once | INTRAMUSCULAR | Status: DC
  Filled 2015-03-20: qty 1

## 2015-03-20 MED ORDER — CLONIDINE HCL 0.2 MG PO TABS
0.2000 mg | ORAL_TABLET | Freq: Once | ORAL | Status: AC
  Administered 2015-03-20: 0.2 mg via ORAL
  Filled 2015-03-20: qty 1

## 2015-03-20 MED ORDER — HYDRALAZINE HCL 20 MG/ML IJ SOLN
10.0000 mg | Freq: Once | INTRAMUSCULAR | Status: AC
  Administered 2015-03-20: 10 mg via INTRAVENOUS
  Filled 2015-03-20: qty 1

## 2015-03-20 MED ORDER — CLONIDINE HCL 0.2 MG PO TABS: 0.2000 mg | ORAL_TABLET | Freq: Two times a day (BID) | ORAL | Status: DC

## 2015-03-20 NOTE — Telephone Encounter (Signed)
Nurse called patient, reached voicemail. Left message for patient to call Marli Diego with [facility], at [phone].  

## 2015-03-20 NOTE — ED Provider Notes (Addendum)
The patient is a 54 year old female, she has a known history of hypertension, she presents today with severe hypertension and a headache, she is also having some stuttering speech and mild expressive aphasia. On exam the patient has bilateral equal grips, has preserved ability to lift all 4 extremities with normal strength, normal finger-nose-finger, she has stuttering speech and some difficulty expressing herself however she is able to get the correct words out eventually. There is no facial droop, cranial nerves III through XII are grossly intact. We'll pursue stroke workup, discussed with neurology, possibly psychiatric cause. Certainly she has severe hypertension which will need attention with both oral medications that she has not taken her clonidine and some time as well as hydralazine, if this does not work she may need a drip.   EKG Interpretation  Date/Time:  Tuesday March 20 2015 11:13:40 EST Ventricular Rate:  50 PR Interval:  132 QRS Duration: 80 QT Interval:  448 QTC Calculation: 408 R Axis:   20 Text Interpretation:  Sinus bradycardia Minimal voltage criteria for LVH, may be normal variant Cannot rule out Anterior infarct , age undetermined Abnormal ECG since last tracing no significant change Confirmed by Sabra Heck  MD, Nene Aranas ([id number]) on 03/20/2015 11:42:56 AM      Medical screening examination/treatment/procedure(s) were conducted as a shared visit with non-physician practitioner(s) and myself.  I personally evaluated the patient during the encounter.  Clinical Impression:   Final diagnoses:  Essential hypertension  Nonintractable headache, unspecified chronicity pattern, unspecified headache type  .       Noemi Chapel, MD 03/21/15 2126  Noemi Chapel, MD 03/31/15 2204

## 2015-03-20 NOTE — Discharge Planning (Signed)
Spoke to patient regarding primary care resources and her [place] orange card. Patient is a current orange card holder and established with care at the [facility]. Follow up appointment made for the patient for Tuesday Jan 3,2017 @ 2:00pm with her pcp, pt verbalized understanding. [facility] [place] [facility] provided as well as my contact information for any future questions or concerns. No other [place] [facility]ist needs identified at this time.  [place] Specialist [facility] [phone]
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
## 2015-03-20 NOTE — ED Notes (Signed)
CT called and made aware of pt

## 2015-03-20 NOTE — ED Notes (Addendum)
Patient here with complaint of hypertension. Reports that she has been out of 1 of her BP meds clonidine x 3 days; normally takes that 3 times a day. Complains of headache and diaphoresis x 3 days. No CP, alert and oriented

## 2015-03-20 NOTE — ED Provider Notes (Signed)
Care of patient taken over from Alecia Lemming at 34.  54 yo who presents with headache for 2 days with increased BP with systolics in the [id number].  During interview patient had differing speech patterns.  Due to this CT head performed which was unchanged.  Neuro has seen patient and advised MRI brain.  If negative patient can be discharged home with clonidine.  Pt currently with improved BPs after hydralazine and oral clonidine.  Headache has improved.  MRI without significant new abnormality.  D/C home in stable condition.  Follow up with PCP.  Esaw Grandchild, MD 03/20/15 [place], MD 03/20/15 [phone]

## 2015-03-20 NOTE — ED Notes (Signed)
Neurology at bedside.
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
## 2015-03-20 NOTE — ED Notes (Signed)
Miller MD at bedside. 

## 2015-03-20 NOTE — Telephone Encounter (Signed)
Pt. Called requesting to speak to the nurse because her BP his high it was 233. Pt. Stated she does not know what to do, has hot flashes and thinks it can be symptoms of a stroke. Please f/u with pt.

## 2015-03-20 NOTE — Discharge Instructions (Signed)
Please read and follow all provided instructions.  Your diagnoses today include:  1. Essential hypertension   2. CVA (cerebral infarction)   3. Nonintractable headache, unspecified chronicity pattern, unspecified headache type     Tests performed today include:  MRI of your brain  Vital signs. See below for your results today.   Medications:   Clonidine - medication for blood pressure  Take any prescribed medications only as directed.  Additional information:  Follow any educational materials contained in this packet.  You are having a headache. No specific cause was found today for your headache. It may have been a migraine or other cause of headache. Stress, anxiety, fatigue, and depression are common triggers for headaches.   Your headache today does not appear to be life-threatening or require hospitalization, but often the exact cause of headaches is not determined in the emergency department. Therefore, follow-up with your doctor is very important to find out what may have caused your headache and whether or not you need any further diagnostic testing or treatment.   Sometimes headaches can appear benign (not harmful), but then more serious symptoms can develop which should prompt an immediate re-evaluation by your doctor or the emergency department.  BE VERY CAREFUL not to take multiple medicines containing Tylenol (also called acetaminophen). Doing so can lead to an overdose which can damage your liver and cause liver failure and possibly death.   Follow-up instructions: Please follow-up with your primary care provider in the next 3 days for further evaluation of your symptoms.   Return instructions:   Please return to the Emergency Department if you experience worsening symptoms.  Return if the medications do not resolve your headache, if it recurs, or if you have multiple episodes of vomiting or cannot keep down fluids.  Return if you have a change from the usual  headache.  RETURN IMMEDIATELY IF you:  Develop a sudden, severe headache  Develop confusion or become poorly responsive or faint  Develop a fever above 100.56F or problem breathing  Have a change in speech, vision, swallowing, or understanding  Develop new weakness, numbness, tingling, incoordination in your arms or legs  Have a seizure  Please return if you have any other emergent concerns.  Additional Information:  Your vital signs today were: BP 173/85 mmHg   Pulse 48   Temp(Src) 98.1 F (36.7 C) (Oral)   Resp 22   SpO2 100% If your blood pressure (BP) was elevated above 135/85 this visit, please have this repeated by your doctor within one month. --------------

## 2015-03-20 NOTE — ED Notes (Addendum)
PA came to RN and reports pt was now having difficulty speaking.  When pt brought back to room pt was fluent with no aphasia and was able to follow all commands.  Upon reassessment pt is having trouble getting her words out but her responses are appropriate.

## 2015-03-20 NOTE — ED Provider Notes (Signed)
Signed out by Dr Sabra Heck at [phone] that d/c instructions are complete, and that MRI is pending, if MRI neg acute, d/c to home.  MRI neg acute.  Pt alert, content, nad.   Pt d/c to home per earlier teams instructions, and is instructed to f/u closely w pcp.     Lajean Saver, MD 03/20/15 [phone]
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
## 2015-03-20 NOTE — Telephone Encounter (Signed)
Patient called nurse, left message on nurses voicemail explaining patient thinks she is having stroke symptoms and does not want to call ambulance due to cost. Nurse called patient, reached voicemail. Left message for patient to call Rontae Inglett with [facility], at [phone].

## 2015-03-21 ENCOUNTER — Other Ambulatory Visit: Payer: Self-pay | Admitting: Internal Medicine

## 2015-03-21 DIAGNOSIS — I10 Essential (primary) hypertension: Secondary | ICD-10-CM

## 2015-03-21 MED ORDER — CLONIDINE HCL 0.2 MG PO TABS: 0.2000 mg | ORAL_TABLET | Freq: Three times a day (TID) | ORAL | Status: DC

## 2015-03-23 ENCOUNTER — Encounter: Payer: Self-pay | Admitting: Cardiovascular Disease

## 2015-04-03 ENCOUNTER — Ambulatory Visit: Payer: No Typology Code available for payment source | Admitting: Internal Medicine
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
## 2015-04-09 MED FILL — ?OMEPRAZOLE DR 20 MG CAPSUL: 20 | 30 days supply | Qty: 30 | Fill #2

## 2015-04-10 ENCOUNTER — Encounter: Payer: Self-pay | Admitting: Cardiovascular Disease

## 2015-04-10 ENCOUNTER — Ambulatory Visit (INDEPENDENT_AMBULATORY_CARE_PROVIDER_SITE_OTHER): Payer: No Typology Code available for payment source | Admitting: Cardiovascular Disease

## 2015-04-10 VITALS — BP 152/86 | HR 60 | Resp 18 | Ht 64.0 in | Wt 256.0 lb

## 2015-04-10 DIAGNOSIS — I10 Essential (primary) hypertension: Secondary | ICD-10-CM

## 2015-04-10 MED ORDER — LOSARTAN POTASSIUM 100 MG PO TABS: 100.0000 mg | ORAL_TABLET | Freq: Every day | ORAL | Status: DC

## 2015-04-10 NOTE — Patient Instructions (Addendum)
Medication Instructions:  Your physician has recommended you make the following change in your medication:  1-Increase Losartan to 100 mg by mouth daily.  Labwork: NONE  Testing/Procedures: NONE  Follow-Up: Your physician recommends that you schedule a follow-up as needed with primary care doctor.   If you need a refill on your cardiac medications before your next appointment, please call your pharmacy.

## 2015-04-16 ENCOUNTER — Telehealth: Payer: Self-pay

## 2015-04-16 NOTE — Telephone Encounter (Signed)
Called patient this am  Patient complains of having a cough and would  Really like an appointment to be seen Call transferred to the front desk

## 2015-04-17 MED FILL — ?METFORMIN HCL 850 MG TABLE: 850 | 30 days supply | Qty: 30 | Fill #4

## 2015-04-17 MED FILL — FUROSEMIDE 20 MG TABLET: 20 | 30 days supply | Qty: 30 | Fill #3

## 2015-04-17 MED FILL — OXYBUTYNIN 5 MG TABLET: 5 | 30 days supply | Qty: 60 | Fill #3

## 2015-04-17 MED FILL — cloNIDine HCL 0.2 MG TABS: 0.2 | 30 days supply | Qty: 90 | Fill #1

## 2015-04-17 MED FILL — LOSARTAN POTASSIUM 100 MG T: 100 | 30 days supply | Qty: 30 | Fill #0

## 2015-04-19 ENCOUNTER — Ambulatory Visit: Payer: No Typology Code available for payment source | Attending: Internal Medicine | Admitting: Internal Medicine

## 2015-04-19 ENCOUNTER — Encounter: Payer: Self-pay | Admitting: Internal Medicine

## 2015-04-19 VITALS — BP 156/86 | HR 56 | Temp 98.0°F | Resp 16 | Ht 64.0 in | Wt 257.0 lb

## 2015-04-19 DIAGNOSIS — R51 Headache: Secondary | ICD-10-CM | POA: Insufficient documentation

## 2015-04-19 DIAGNOSIS — R059 Cough, unspecified: Secondary | ICD-10-CM

## 2015-04-19 DIAGNOSIS — R05 Cough: Secondary | ICD-10-CM | POA: Insufficient documentation

## 2015-04-19 DIAGNOSIS — R0602 Shortness of breath: Secondary | ICD-10-CM | POA: Insufficient documentation

## 2015-04-19 MED ORDER — BENZONATATE 200 MG PO CAPS: 200.0000 mg | ORAL_CAPSULE | Freq: Two times a day (BID) | ORAL | Status: DC | PRN

## 2015-04-19 MED ORDER — BECLOMETHASONE DIPROPIONATE 40 MCG/ACT IN AERS: 2.0000 | INHALATION_SPRAY | Freq: Two times a day (BID) | RESPIRATORY_TRACT | Status: DC

## 2015-04-19 MED ORDER — ALBUTEROL SULFATE HFA 108 (90 BASE) MCG/ACT IN AERS: 2.0000 | INHALATION_SPRAY | Freq: Four times a day (QID) | RESPIRATORY_TRACT | Status: DC | PRN

## 2015-04-19 MED FILL — !QVAR 40 MCG ORAL INHALER: 40MCG | 30 days supply | Qty: 1 | Fill #0

## 2015-04-19 MED FILL — VENTOLIN HFA 90 MCG INHALER: 108 (90 BAS | 25 days supply | Qty: 18 | Fill #0

## 2015-04-19 MED FILL — BENZONATATE 100 MG CAPSULE: 100 | 30 days supply | Qty: 120 | Fill #0

## 2015-04-19 NOTE — Progress Notes (Signed)
   Subjective:    Patient ID: Sabrina [name], female    DOB: 1960-06-15, 55 y.o.   MRN: [id number]  Cough This is a recurrent problem. The current episode started more than 1 month ago. The problem has been gradually worsening. The problem occurs every few hours. The cough is non-productive. Associated symptoms include headaches and shortness of breath. Pertinent negatives include no chest pain, heartburn, hemoptysis, rhinorrhea or wheezing. Nothing aggravates the symptoms. She has tried OTC cough suppressant, rest and cool air for the symptoms. The treatment provided no relief. Her past medical history is significant for environmental allergies. There is no history of COPD.    Review of Systems  HENT: Negative for rhinorrhea.   Respiratory: Positive for cough and shortness of breath. Negative for hemoptysis and wheezing.   Cardiovascular: Negative for chest pain.  Gastrointestinal: Negative for heartburn.  Allergic/Immunologic: Positive for environmental allergies.  Neurological: Positive for headaches.  All other systems reviewed and are negative.     Objective:   Physical Exam  Constitutional: She is oriented to person, place, and time.  HENT:  Right Ear: External ear normal.  Left Ear: External ear normal.  Mouth/Throat: Oropharynx is clear and moist.  Eyes: Conjunctivae are normal. Pupils are equal, round, and reactive to light. Right eye exhibits no discharge.  Cardiovascular: Normal rate, regular rhythm and normal heart sounds.   Pulmonary/Chest: Effort normal and breath sounds normal. She has no wheezes. She exhibits no tenderness.  Abdominal: There is no tenderness.  Neurological: She is alert and oriented to person, place, and time.  Skin: Skin is warm and dry.      Assessment & Plan:  [name] was seen today for cough.  Diagnoses and all orders for this visit:  Cough -     DG Chest 2 View; Future -     beclomethasone (QVAR) 40 MCG/ACT inhaler; Inhale 2 puffs into the  lungs 2 (two) times daily. -     albuterol (PROVENTIL HFA;VENTOLIN HFA) 108 (90 Base) MCG/ACT inhaler; Inhale 2 puffs into the lungs every 6 (six) hours as needed for wheezing or shortness of breath. -     benzonatate (TESSALON) 200 MG capsule; Take 1 capsule (200 mg total) by mouth 2 (two) times daily as needed for cough. I will start her on inhalers to treat for bronchitis and some possible asthma. I will follow up with her in 10 days to see if she has had improvement  Return in about 10 days (around 04/29/2015) for cough f/u.   Lance Bosch, NP 04/19/2015 6:11 PM

## 2015-04-19 NOTE — Progress Notes (Signed)
Patient complains of having an excruciating cough that has Become disruptive for her She coughs in church in her crochet class and has become annoying

## 2015-04-20 ENCOUNTER — Telehealth: Payer: Self-pay

## 2015-04-20 ENCOUNTER — Ambulatory Visit (HOSPITAL_COMMUNITY)
Admission: RE | Admit: 2015-04-20 | Discharge: 2015-04-20 | Disposition: A | Discharge status: Home / Self Care | Payer: No Typology Code available for payment source | Source: Ambulatory Visit | Attending: Internal Medicine | Admitting: Internal Medicine

## 2015-04-20 DIAGNOSIS — R059 Cough, unspecified: Secondary | ICD-10-CM

## 2015-04-20 DIAGNOSIS — R05 Cough: Secondary | ICD-10-CM | POA: Insufficient documentation

## 2015-04-20 NOTE — Telephone Encounter (Signed)
Patient returned phone call and is aware of her x ray results

## 2015-04-20 NOTE — Telephone Encounter (Signed)
-----   Message from Lance Bosch, NP sent at 04/20/2015 12:41 PM EST ----- Normal chest xray

## 2015-04-20 NOTE — Telephone Encounter (Signed)
Tried to call patient  Patient not available Left message on voice mail to return our call 

## 2015-04-30 ENCOUNTER — Ambulatory Visit: Payer: No Typology Code available for payment source | Admitting: Internal Medicine

## 2015-05-08 MED FILL — ?OMEPRAZOLE DR 20 MG CAPSUL: 20 | 30 days supply | Qty: 30 | Fill #3

## 2015-05-21 ENCOUNTER — Encounter: Payer: Self-pay | Admitting: Internal Medicine

## 2015-05-21 ENCOUNTER — Other Ambulatory Visit: Payer: Self-pay | Admitting: Internal Medicine

## 2015-05-21 ENCOUNTER — Encounter: Payer: Self-pay | Admitting: Gastroenterology

## 2015-05-21 ENCOUNTER — Ambulatory Visit: Payer: Self-pay | Attending: Internal Medicine | Admitting: Internal Medicine

## 2015-05-21 VITALS — BP 170/92 | HR 65 | Temp 98.0°F | Resp 16 | Ht 64.0 in | Wt 259.0 lb

## 2015-05-21 DIAGNOSIS — K219 Gastro-esophageal reflux disease without esophagitis: Secondary | ICD-10-CM | POA: Insufficient documentation

## 2015-05-21 DIAGNOSIS — R059 Cough, unspecified: Secondary | ICD-10-CM

## 2015-05-21 DIAGNOSIS — Z7982 Long term (current) use of aspirin: Secondary | ICD-10-CM | POA: Insufficient documentation

## 2015-05-21 DIAGNOSIS — Z1211 Encounter for screening for malignant neoplasm of colon: Secondary | ICD-10-CM

## 2015-05-21 DIAGNOSIS — Z6841 Body Mass Index (BMI) 40.0 and over, adult: Secondary | ICD-10-CM | POA: Insufficient documentation

## 2015-05-21 DIAGNOSIS — J449 Chronic obstructive pulmonary disease, unspecified: Secondary | ICD-10-CM | POA: Insufficient documentation

## 2015-05-21 DIAGNOSIS — E785 Hyperlipidemia, unspecified: Secondary | ICD-10-CM | POA: Insufficient documentation

## 2015-05-21 DIAGNOSIS — R05 Cough: Secondary | ICD-10-CM | POA: Insufficient documentation

## 2015-05-21 DIAGNOSIS — F319 Bipolar disorder, unspecified: Secondary | ICD-10-CM | POA: Insufficient documentation

## 2015-05-21 DIAGNOSIS — I10 Essential (primary) hypertension: Secondary | ICD-10-CM | POA: Insufficient documentation

## 2015-05-21 DIAGNOSIS — E119 Type 2 diabetes mellitus without complications: Secondary | ICD-10-CM | POA: Insufficient documentation

## 2015-05-21 DIAGNOSIS — Z880 Allergy status to penicillin: Secondary | ICD-10-CM | POA: Insufficient documentation

## 2015-05-21 DIAGNOSIS — Z1159 Encounter for screening for other viral diseases: Secondary | ICD-10-CM

## 2015-05-21 DIAGNOSIS — G8929 Other chronic pain: Secondary | ICD-10-CM | POA: Insufficient documentation

## 2015-05-21 DIAGNOSIS — M069 Rheumatoid arthritis, unspecified: Secondary | ICD-10-CM | POA: Insufficient documentation

## 2015-05-21 DIAGNOSIS — M109 Gout, unspecified: Secondary | ICD-10-CM | POA: Insufficient documentation

## 2015-05-21 DIAGNOSIS — Z79899 Other long term (current) drug therapy: Secondary | ICD-10-CM | POA: Insufficient documentation

## 2015-05-21 DIAGNOSIS — Z590 Homelessness: Secondary | ICD-10-CM | POA: Insufficient documentation

## 2015-05-21 DIAGNOSIS — M1990 Unspecified osteoarthritis, unspecified site: Secondary | ICD-10-CM | POA: Insufficient documentation

## 2015-05-21 DIAGNOSIS — Z7984 Long term (current) use of oral hypoglycemic drugs: Secondary | ICD-10-CM | POA: Insufficient documentation

## 2015-05-21 LAB — GLUCOSE, POCT (MANUAL RESULT ENTRY): POC GLUCOSE: 107 mg/dL — AB (ref 70–99)

## 2015-05-21 LAB — POCT GLYCOSYLATED HEMOGLOBIN (HGB A1C): HEMOGLOBIN A1C: 5.5

## 2015-05-21 MED ORDER — GUAIFENESIN-CODEINE 100-10 MG/5ML PO SYRP: 5.0000 mL | ORAL_SOLUTION | Freq: Three times a day (TID) | ORAL | Status: DC | PRN

## 2015-05-21 MED FILL — GUAIFENESIN AC COUGH SYRUP: 100-10 | 8 days supply | Qty: 120 | Fill #0

## 2015-05-21 NOTE — Patient Instructions (Addendum)
[place] ?Call to schedule appointment Podiatrist in [place], [facility] Address: [address], [place],  [address] Phone: [phone] Hours: Open toda y  8AM-5PM  I have placed pulmonology referral and colonoscopy
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
## 2015-05-21 NOTE — Progress Notes (Signed)
Patient here for her persistent cough Patient states she coughs so much she gets a headache Also complains of pain to her right great tow Patient's blood pressure is elevated  Patient literally just took her catapress here in the office

## 2015-05-22 LAB — HEPATITIS PANEL, ACUTE
HCV AB: NEGATIVE
HEP B C IGM: NONREACTIVE
Hep A IgM: NONREACTIVE
Hepatitis B Surface Ag: NEGATIVE

## 2015-05-22 MED FILL — metFORMIN HCL 850 MG TABS: 850 | 30 days supply | Qty: 30 | Fill #0

## 2015-05-24 ENCOUNTER — Ambulatory Visit: Payer: No Typology Code available for payment source | Attending: Internal Medicine

## 2015-05-24 MED FILL — ATENOLOL 50 MG TABLET: 50 | 30 days supply | Qty: 60 | Fill #3

## 2015-05-24 NOTE — Progress Notes (Unsigned)
Patient has returned back to the office to have her ppd read Ppd is negative with (0) duration

## 2015-05-25 ENCOUNTER — Other Ambulatory Visit: Payer: Self-pay | Admitting: Internal Medicine

## 2015-05-25 ENCOUNTER — Other Ambulatory Visit: Payer: Self-pay

## 2015-05-25 ENCOUNTER — Telehealth: Payer: Self-pay

## 2015-05-25 DIAGNOSIS — I10 Essential (primary) hypertension: Secondary | ICD-10-CM

## 2015-05-25 MED ORDER — CLONIDINE HCL 0.2 MG PO TABS: 0.2000 mg | ORAL_TABLET | Freq: Three times a day (TID) | ORAL | Status: DC

## 2015-05-25 MED FILL — cloNIDine HCL 0.2 MG TABS: 0.2 | 30 days supply | Qty: 90 | Fill #0

## 2015-05-25 NOTE — Telephone Encounter (Signed)
-----   Message from Lance Bosch, NP sent at 05/24/2015  2:25 PM EST ----- The cough medication I gave her has codeine in it so it should help. She may use some tylenol with it to help with soreness. Has Pulmonology called her yet? ----- Message -----    From: Dorothe Pea, LPN    Sent: [id number]   2:22 PM      To: Lance Bosch, NP  Patient is requesting an RX for pain medication due to all her coughing thanks

## 2015-05-25 NOTE — Telephone Encounter (Signed)
Tried to contact patient in reference to her request for  Pain medication Patient not available Message left on voice mail to return our call

## 2015-05-28 ENCOUNTER — Ambulatory Visit: Payer: No Typology Code available for payment source | Admitting: Internal Medicine

## 2015-05-28 ENCOUNTER — Other Ambulatory Visit: Payer: Self-pay | Admitting: Internal Medicine

## 2015-05-28 ENCOUNTER — Telehealth: Payer: Self-pay

## 2015-05-28 ENCOUNTER — Telehealth: Payer: Self-pay | Admitting: Internal Medicine

## 2015-05-28 MED FILL — LOSARTAN POTASSIUM 100 MG T: 100 | 30 days supply | Qty: 30 | Fill #1

## 2015-05-28 MED FILL — BENZONATATE 100 MG CAPSULE: 100 | 30 days supply | Qty: 120 | Fill #1

## 2015-05-28 NOTE — Telephone Encounter (Signed)
Tried to contact patient this am Patient not available  Message left on voice mail to return our call 

## 2015-05-28 NOTE — Telephone Encounter (Signed)
error 

## 2015-05-28 NOTE — Telephone Encounter (Signed)
-----   Message from Lance Bosch, NP sent at 05/27/2015  5:22 PM EST ----- Negative Hep panel

## 2015-06-06 ENCOUNTER — Telehealth: Payer: Self-pay | Admitting: Pulmonary Disease

## 2015-06-06 ENCOUNTER — Ambulatory Visit (INDEPENDENT_AMBULATORY_CARE_PROVIDER_SITE_OTHER): Payer: No Typology Code available for payment source | Admitting: Pulmonary Disease

## 2015-06-06 ENCOUNTER — Encounter: Payer: Self-pay | Admitting: Pulmonary Disease

## 2015-06-06 VITALS — BP 132/80 | HR 60 | Ht 64.0 in | Wt 259.6 lb

## 2015-06-06 DIAGNOSIS — R059 Cough, unspecified: Secondary | ICD-10-CM | POA: Insufficient documentation

## 2015-06-06 DIAGNOSIS — R609 Edema, unspecified: Secondary | ICD-10-CM

## 2015-06-06 DIAGNOSIS — R05 Cough: Secondary | ICD-10-CM

## 2015-06-06 DIAGNOSIS — R0600 Dyspnea, unspecified: Secondary | ICD-10-CM

## 2015-06-06 DIAGNOSIS — M059 Rheumatoid arthritis with rheumatoid factor, unspecified: Secondary | ICD-10-CM

## 2015-06-06 MED ORDER — BENZONATATE 100 MG PO CAPS: 100.0000 mg | ORAL_CAPSULE | Freq: Three times a day (TID) | ORAL | Status: DC | PRN

## 2015-06-06 MED ORDER — RANITIDINE HCL 150 MG PO TABS: 150.0000 mg | ORAL_TABLET | Freq: Every day | ORAL | Status: DC

## 2015-06-06 MED FILL — BENZONATATE 100 MG CAPSULE: 100 | 30 days supply | Qty: 120 | Fill #0

## 2015-06-06 MED FILL — raNITIdine HCL 150 MG TABS: 150 | 30 days supply | Qty: 30 | Fill #0

## 2015-06-06 MED FILL — ?OMEPRAZOLE DR 20 MG CAPSUL: 20 | 30 days supply | Qty: 30 | Fill #4
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
## 2015-06-06 NOTE — Patient Instructions (Signed)
   Remember to use your Ventolin (albuterol) inhaler as we practiced. You can use 2 puffs every 4 hours as needed for your cough.  Please try to avoid eating or drinking anything within 2 hours of bedtime. Hopefully this in addition to your Zantac that I am starting today will help her cough and reflux.  Please continue taking Prilosec (omeprazole) as prescribed.  You can discontinue use of your Qvar inhaler  I will see you back in 4-8 weeks with a breathing test at that time. Call me if you have any further questions or concerns.  TESTS ORDERED: 1. Full pulmonary function testing at next appointment

## 2015-06-06 NOTE — Telephone Encounter (Signed)
IMAGING CXR PA/LAT 04/20/15 (personally reviewed by me): Low lung volumes. No parenchymal opacity or mass appreciated. No pleural effusion or thickening appreciated. Heart normal in size. Normal mediastinal contour.  CARDIAC TTE (03/10/14): LV normal in size with mild LVH. EF 55-60%. LA mildly dilated. RA normal in size. RV normal in size and function. RV systolic pressure 36 mmHg. No aortic stenosis. Trivial mitral regurgitation. No atrial septal defect or PFO. Trivial pulmonic regurgitation. Mild tricuspid regurgitation.  LABS 03/20/15 CBC: 7.6/12.9/39.2/290 BMP: 141/4.0/107/27/9/0.75/84/9.5 LFT: 3.7/7.7/0.5/75/40/36  02/28/14 HIV:  Nonreactive   12/3/9 RF:  69

## 2015-06-11 MED FILL — FUROSEMIDE 20 MG TABLET: 20 | 30 days supply | Qty: 30 | Fill #0

## 2015-06-13 ENCOUNTER — Other Ambulatory Visit: Payer: Self-pay

## 2015-06-13 ENCOUNTER — Ambulatory Visit: Payer: No Typology Code available for payment source | Attending: Family Medicine | Admitting: Family Medicine

## 2015-06-13 ENCOUNTER — Encounter: Payer: Self-pay | Admitting: Family Medicine

## 2015-06-13 VITALS — BP 160/98 | HR 53 | Temp 98.3°F | Resp 16 | Ht 64.0 in | Wt 259.0 lb

## 2015-06-13 DIAGNOSIS — E669 Obesity, unspecified: Secondary | ICD-10-CM

## 2015-06-13 DIAGNOSIS — Y9342 Activity, yoga: Secondary | ICD-10-CM

## 2015-06-13 DIAGNOSIS — R079 Chest pain, unspecified: Secondary | ICD-10-CM | POA: Insufficient documentation

## 2015-06-13 DIAGNOSIS — M729 Fibroblastic disorder, unspecified: Secondary | ICD-10-CM | POA: Insufficient documentation

## 2015-06-13 DIAGNOSIS — M21619 Bunion of unspecified foot: Secondary | ICD-10-CM | POA: Insufficient documentation

## 2015-06-13 DIAGNOSIS — M21611 Bunion of right foot: Secondary | ICD-10-CM | POA: Insufficient documentation

## 2015-06-13 DIAGNOSIS — M25561 Pain in right knee: Secondary | ICD-10-CM

## 2015-06-13 DIAGNOSIS — L600 Ingrowing nail: Secondary | ICD-10-CM | POA: Insufficient documentation

## 2015-06-13 DIAGNOSIS — I10 Essential (primary) hypertension: Secondary | ICD-10-CM | POA: Insufficient documentation

## 2015-06-13 DIAGNOSIS — E1169 Type 2 diabetes mellitus with other specified complication: Secondary | ICD-10-CM

## 2015-06-13 DIAGNOSIS — M25562 Pain in left knee: Secondary | ICD-10-CM

## 2015-06-13 DIAGNOSIS — E119 Type 2 diabetes mellitus without complications: Secondary | ICD-10-CM | POA: Insufficient documentation

## 2015-06-13 LAB — GLUCOSE, POCT (MANUAL RESULT ENTRY): POC GLUCOSE: 84 mg/dL (ref 70–99)

## 2015-06-13 NOTE — Patient Instructions (Signed)
It is a pleasure to see you today.   For the chest pain, we are getting an ECG.  Continue your medications as you are taking them.  Referral to Cardiology for follow up of the Myoview from last June.    X-rays of knees for evaluation of knee pain.   Referral to Podiatrist for evaluation and treatment of the bunion and ingrown nail on the Right great toe; plantar foot pain.   Follow up in the coming 1 month or sooner as needed.

## 2015-06-13 NOTE — Progress Notes (Signed)
   Subjective:    Patient ID: Sabrina [name], female    DOB: 02/12/1961, 55 y.o.   MRN: [id number]  HPI  Patient presents for complaint of bilateral knee pain that has come on recently, worse on the L than R knee. Worse when she works at [employer] on her feet all day.  Also with R foot plantar foot pain and bunion that causes pain in that foot.    Previously she took Naproxen for musculoskeletal pain, helped.  Also took hydrocodone in the past for same.   Upon ROS, patient reports continued intermittent chest pain, comes and goes.  Epic shows that she had a Myoview in June 2016; note from Dr Johnsie Cancel 08/2014, requesting patient be scheduled for cath.   PMHx DM, HTN, obesity. Problem LIst reviewed.   Social Hx; Patient never-smoker, no alcohol.  Tenuous living situation, currently in motel. 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   Review of Systems Today she reports intermittent low-intensity R sided chest pain that is episodic, associates it with "itching", no associated diaphoresis or dyspnea. Ebbs and gone by end of interview.     Objective:   Physical Exam Well appearing, no distress HEENT neck supple, no cervical adenopathy COR Regular [id number], no pain illicited with palpation of chest/sternum.  PULM Clear bilaterally, no rales or wheezes ABD soft.  MSK: Pain along L knee medial and lateral joint spaces. No effusion noted. Full active ROM and strength testing of both knees.  Significant lymphedema on R ankle. No clonus. Palpable dp pulses bilaterally. Noticeable bunion R great toe with ingrown nail. Brisk cap refill bilaterally in great toes, sensation grossly intact bilateral feet.        Assessment & Plan:  1. Chest pain, atypical in presentation but with several risk factors and prior Myoview with recommendation for followup cath.  To refer back to Cardiology for this; ECG today in the office given her high BP and reported intermittent chest pain which has dissipated by  the end of my interview with her.  EKG done in office, no acute changes noted when compared to prior ECG from 03/20/15.  2. HTN: continue current BP regimen.  She had not taken her Clonidine dose this afternoon, which may have a bearing on her elevated BP at the time of her arrival.  Given Clonidine 0.2mg  in office today.   3. Bilat Knee Pain: Xrays to start; suspect DJD worsening due to R foot pain.   4. R foot pain: bunion, ingrown nail, plantar pain (fasciitis). For DPM evaluation.   Dalbert Mayotte, MD

## 2015-06-13 NOTE — Progress Notes (Signed)
Patient c/o Bilateral knee pain, requesting referral to podiatrist.  Patient c/o pain in knees. States it feels like someone is taking an axe and chopping away at her knees.  Patient reports it's more painful with movement, some slight pain w/o movement. Pain rated 10/10  Patient has not taken her BP meds this afternoon.  Per Dr. Lindell Noe, patient will be given 0.2 clonidine in clinic.

## 2015-06-13 NOTE — Congregational Nurse Program (Signed)
Congregational Nurse Program Note  Date of Encounter: 06/13/2015  Past Medical History: Past Medical History  Diagnosis Date  . Hypertension   . Hyperlipidemia LDL goal < 100     "not on RX" (11/15/2014)  . Gout   . Bipolar disorder ([place])     2 breakdowns - 1998, 2000 had to be hospitalized, followed at [facility]  . GERD (gastroesophageal reflux disease)   . Morbid obesity with BMI of 40.0-44.9, adult ([place])   . Depression   . Mixed restrictive and obstructive lung disease ([place])     Health serve chart suggests PFTs done 1/10  . Chronic bronchitis ([place])     "get it q yr"  . Type II diabetes mellitus ([place])   . Migraine     "monthly" (11/16/2014)  . Seizures ([place])     "might have had 1; I'm on depakote" (11/16/2014)  . Rheumatoid arthritis [facility])     Health serve records indicate Rheumatoid  . Arthritis     "legs, knees, hands" (11/16/2014)  . Chronic lower back pain   . Anxiety   . Chest pain     a. Myoview 6/16:  anterior and apical ischemia, EF 55-65%;  b. LHC 8/16:  no CAD, Normal EF  . History of echocardiogram     a. Echo 12/15:  Mild LVH, EF 55-60%, mild LAE, PASP 36 mmHg    Encounter Details:     CNP Questionnaire - 06/13/15 1534    Patient Demographics   Is this a new or existing patient? New   Patient is considered a/an Not Applicable   Race African-American/Black   Patient Assistance   Location of Patient Assistance Not Applicable   Patient's financial/insurance status Medicaid   Uninsured Patient No   Patient referred to apply for the following financial assistance Not Applicable   Food insecurities addressed Not Applicable   Transportation assistance No   Assistance securing medications No   Educational health offerings Exercise/physical activity   Encounter Details   Primary purpose of visit Education/Health Concerns   Was an Emergency Department visit averted? Not Applicable   Does patient have a medical provider? Yes   Patient referred to Not Applicable    Was a mental health screening completed? (GAINS tool) No   Does patient have dental issues? No   Does patient have vision issues? No   Since previous encounter, have you referred patient for abnormal blood pressure that resulted in a new diagnosis or medication change? No   Since previous encounter, have you referred patient for abnormal blood glucose that resulted in a new diagnosis or medication change? No   For Abstraction Use Only   Does patient have insurance? Yes       Attended Chair Yoga Class and Lunch and Learn Class on Emotional Health.

## 2015-06-18 ENCOUNTER — Other Ambulatory Visit: Payer: Self-pay | Admitting: Family Medicine

## 2015-06-18 ENCOUNTER — Ambulatory Visit (HOSPITAL_COMMUNITY)
Admission: RE | Admit: 2015-06-18 | Discharge: 2015-06-18 | Disposition: A | Discharge status: Home / Self Care | Payer: No Typology Code available for payment source | Source: Ambulatory Visit | Attending: Family Medicine | Admitting: Family Medicine

## 2015-06-18 DIAGNOSIS — M1711 Unilateral primary osteoarthritis, right knee: Secondary | ICD-10-CM | POA: Insufficient documentation

## 2015-06-18 DIAGNOSIS — M25562 Pain in left knee: Secondary | ICD-10-CM | POA: Insufficient documentation

## 2015-06-18 DIAGNOSIS — M25561 Pain in right knee: Secondary | ICD-10-CM | POA: Insufficient documentation

## 2015-06-19 ENCOUNTER — Telehealth: Payer: Self-pay

## 2015-06-19 NOTE — Telephone Encounter (Signed)
CMA called patient, patient verified name and DOB. Patient was given lab results, verbalized she understood with no further questions.

## 2015-06-19 NOTE — Telephone Encounter (Signed)
-----   Message from Willeen Niece, MD sent at 06/19/2015  9:03 AM EDT ----- Please let patient know her x-rays of knees shows osteoarthritis.  May follow up with her primary provider for further management.  Dalbert Mayotte, MD

## 2015-06-26 ENCOUNTER — Other Ambulatory Visit: Payer: Self-pay | Admitting: Internal Medicine

## 2015-06-26 MED FILL — OXYBUTYNIN 5 MG TABLET: 5 | 30 days supply | Qty: 60 | Fill #0

## 2015-06-26 MED FILL — LOSARTAN POTASSIUM 100 MG T: 100 | 30 days supply | Qty: 30 | Fill #2

## 2015-06-26 MED FILL — metFORMIN HCL 850 MG TABS: 850 | 30 days supply | Qty: 30 | Fill #1

## 2015-06-26 MED FILL — cloNIDine HCL 0.2 MG TABS: 0.2 | 30 days supply | Qty: 90 | Fill #1

## 2015-06-26 NOTE — Telephone Encounter (Signed)
This encounter was created in error - please disregard.

## 2015-06-28 ENCOUNTER — Telehealth: Payer: Self-pay | Admitting: *Deleted

## 2015-06-28 NOTE — Telephone Encounter (Signed)
Instructed pt we need her to see cardio and get cardiac clearance prior to her screening colon per Dr Havery Moros due to her complaints of chest pain. Instructed will cancel her PV 07-03-15 and her colon 07-17-15 and after she sees cardio and gets cleared she needs to call us back and RS her colon and PV . Pt verbalized understanding and states she has no more chest pain but she has back pain.  Instructed after she sees cardio and he completes all his testing we can then do her screening colon.  Pt.Verbalized understanding  Sabrina [name]

## 2015-06-28 NOTE — Telephone Encounter (Signed)
Dr Havery Moros, This lady is scheduled for a direct colon with you 07-17-15, Tuesday. She has no GI hx.  She was seen by her PCP 05-2015 and was noted to have chest pain, unspecified and atypical in presentation. She was referred back to cardiology and has an Ov with Dr Johnsie Cancel 08-07-2015 Tuesday.  She has a hx of a cardiac cath 11-15-2014 EF 55-65 % , no other issues , she had an echo 05-11-13 EF 55-60%, No AVS noted but the PCP not states she had a myoview that recommended repeat cardiac  cath.  Do you want her cleared by cardiology prior to her colon? Please advise and thanks for your time,  Marijean Niemann

## 2015-06-28 NOTE — Telephone Encounter (Signed)
On review of her records she did have a cardiac cath to follow up the abnormal myoview last year and the cardiac cath was negative. I would think she is okay however if this is a screening colonoscopy and her primary care wanted a follow up with cardiology for chest pain, it may be best to postpone her colonoscopy.

## 2015-06-29 LAB — GLUCOSE, POCT (MANUAL RESULT ENTRY): POC GLUCOSE: 98 mg/dL (ref 70–99)

## 2015-07-02 ENCOUNTER — Ambulatory Visit (INDEPENDENT_AMBULATORY_CARE_PROVIDER_SITE_OTHER): Payer: No Typology Code available for payment source | Admitting: Physician Assistant

## 2015-07-02 ENCOUNTER — Encounter: Payer: Self-pay | Admitting: Gastroenterology

## 2015-07-02 ENCOUNTER — Encounter: Payer: Self-pay | Admitting: Physician Assistant

## 2015-07-02 VITALS — BP 144/84 | HR 57 | Ht 64.0 in | Wt 259.0 lb

## 2015-07-02 DIAGNOSIS — R079 Chest pain, unspecified: Secondary | ICD-10-CM

## 2015-07-02 DIAGNOSIS — I10 Essential (primary) hypertension: Secondary | ICD-10-CM

## 2015-07-02 DIAGNOSIS — Z01818 Encounter for other preprocedural examination: Secondary | ICD-10-CM
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
## 2015-07-02 NOTE — Assessment & Plan Note (Signed)
Patient is here for preoperative cardiac clearance before undergoing colonoscopy. She had a normal cardiac catheterization in August 2017. She has musculoskeletal chest pain when she coughs. She has occasional dyspnea on exertion but is overweight. Discussed with Dr. Dorris Carnes who concurs no further cardiac workup is necessary. Follow-up with Dr. Johnsie Cancel as needed.

## 2015-07-02 NOTE — Patient Instructions (Signed)
Medication Instructions:  Your physician recommends that you continue on your current medications as directed. Please refer to the Current Medication list given to you today.   Labwork: None.  Testing/Procedures: None.  Follow-Up: Follow up as needed.   Any Other Special Instructions Will Be Listed Below (If Applicable).     If you need a refill on your cardiac medications before your next appointment, please call your pharmacy.   

## 2015-07-02 NOTE — Assessment & Plan Note (Signed)
Blood Pressures a little high today. Follow-up with primary care

## 2015-07-02 NOTE — Assessment & Plan Note (Signed)
Patient has musculoskeletal type chest pain when she coughs. She has no anginal symptoms. She had a normal cardiac catheterization 11/15/14 after an abnormal Myoview. Discussed this patient with Dr. Dorris Carnes who concurs that she can proceed with colonoscopy without any further cardiac workup. Follow-up with Dr.Nishan when necessary.

## 2015-07-05 MED FILL — ?OMEPRAZOLE DR 20 MG CAPSUL: 20 | 30 days supply | Qty: 30 | Fill #5

## 2015-07-09 NOTE — Progress Notes (Unsigned)
Patient ID: Sabrina [name], female   DOB: 04-07-1960, 54 y.o.   MRN: [id number] Patient called wanting to speak with clinical staff concerning her cough that hasn't gotten better, she states that her sxs returned with R side of throat pain and bodyaches.   Patient said that when she coughs, it causes HA's. Patient requesting to come in for f/up visit. Patient was transferred to the front to see if she could make an appt.

## 2015-07-10 ENCOUNTER — Ambulatory Visit (INDEPENDENT_AMBULATORY_CARE_PROVIDER_SITE_OTHER): Payer: No Typology Code available for payment source | Admitting: Podiatry

## 2015-07-10 ENCOUNTER — Encounter: Payer: Self-pay | Admitting: Podiatry

## 2015-07-10 VITALS — BP 156/84 | HR 62 | Resp 12

## 2015-07-10 DIAGNOSIS — E11628 Type 2 diabetes mellitus with other skin complications: Secondary | ICD-10-CM

## 2015-07-10 DIAGNOSIS — B351 Tinea unguium: Secondary | ICD-10-CM

## 2015-07-10 DIAGNOSIS — E119 Type 2 diabetes mellitus without complications: Secondary | ICD-10-CM

## 2015-07-10 DIAGNOSIS — L84 Corns and callosities: Secondary | ICD-10-CM

## 2015-07-10 NOTE — Progress Notes (Signed)
   Subjective:    Patient ID: Sabrina [name], female    DOB: 1960/11/29, 55 y.o.   MRN: [id number]  HPI    Patient presents today describing a 20+ year history of some thickened skin on the plantar aspect of her right foot, hallux and heel area that she treats with Vaseline on daily basis. She thinks that the dryness is worsening over time. Patient also is requesting debridement of a callus on right great toe and her incurvated toenails. Patient describes generalized foot pain when she works part-time job on the weekends requiring standing and walking. She works in [employer] pl[employer] and has tried some soft shoe cushions inside the shoes, however, still complains of generalized foot pain when weightbearing and standing for prolonged periods of time Patient denies any recent podiatric care Patient is diabetic and denies history of ulceration, claudication or amputation   Review of Systems  Cardiovascular: Positive for leg swelling.  Musculoskeletal: Positive for myalgias, joint swelling and gait problem.  Skin: Positive for color change.       Objective:   Physical Exam  Orientated 3  Vascular: DP pulses 2/4 bilaterally PT pulses 2/4 bilaterally Capillary reflex immediate bilaterally  Neurological: Sensation to 10 g monofilament wire intact 5/5 bilaterally Vibratory sensation equal reactive bilaterally Ankle reflex equal and reactive bilaterally  Dermatological: No open skin lesions bilaterally Dry skin bilaterally without fissuring bilaterally Well-organized callus medial plantar right hallux Incurvated toenails with mild hypertrophy and color changes distally 6-10  Musculoskeletal: No deformities noted bilaterally There is no restriction or crepitus on range of motion of ankle, subtalar, midtarsal, metatarsophalangeal joints bilaterally      Assessment & Plan:   Assessment: Satisfactory neurovascular status Diabetic without foot complications Incurvated  mycotic toenails 6-10 Callus 1 Mild xerosis bilaterally  Plan: Today reviewed results of the diabetic foot screen with patient today and made aware that she had satisfactory circulation and feeling in her feet. I suggested that she wear an athletic style shoe when standing walking. Also suggested over-the-counter skin moisturizing cream such as cocoa butter to be used once or twice daily  The toenails 6-10 are debrided mechanically and electrically without a bleeding Callus 1 debrided without a bleeding Generalized diabetic foot care provided to patient and after visit summary  Reappoint when necessary at patient's request or yearly intervals

## 2015-07-10 NOTE — Patient Instructions (Signed)
Diabetes and Foot Care Diabetes may cause you to have problems because of poor blood supply (circulation) to your feet and legs. This may cause the skin on your feet to become thinner, break easier, and heal more slowly. Your skin may become dry, and the skin may peel and crack. You may also have nerve damage in your legs and feet causing decreased feeling in them. You may not notice minor injuries to your feet that could lead to infections or more serious problems. Taking care of your feet is one of the most important things you can do for yourself.  HOME CARE INSTRUCTIONS  Wear shoes at all times, even in the house. Do not go barefoot. Bare feet are easily injured.  Check your feet daily for blisters, cuts, and redness. If you cannot see the bottom of your feet, use a mirror or ask someone for help.  Wash your feet with warm water (do not use hot water) and mild soap. Then pat your feet and the areas between your toes until they are completely dry. Do not soak your feet as this can dry your skin.  Apply a moisturizing lotion or petroleum jelly (that does not contain alcohol and is unscented) to the skin on your feet and to dry, brittle toenails. Do not apply lotion between your toes.  Trim your toenails straight across. Do not dig under them or around the cuticle. File the edges of your nails with an emery board or nail file.  Do not cut corns or calluses or try to remove them with medicine.  Wear clean socks or stockings every day. Make sure they are not too tight. Do not wear knee-high stockings since they may decrease blood flow to your legs.  Wear shoes that fit properly and have enough cushioning. To break in new shoes, wear them for just a few hours a day. This prevents you from injuring your feet. Always look in your shoes before you put them on to be sure there are no objects inside.  Do not cross your legs. This may decrease the blood flow to your feet.  If you find a minor scrape,  cut, or break in the skin on your feet, keep it and the skin around it clean and dry. These areas may be cleansed with mild soap and water. Do not cleanse the area with peroxide, alcohol, or iodine.  When you remove an adhesive bandage, be sure not to damage the skin around it.  If you have a wound, look at it several times a day to make sure it is healing.  Do not use heating pads or hot water bottles. They may burn your skin. If you have lost feeling in your feet or legs, you may not know it is happening until it is too late.  Make sure your health care provider performs a complete foot exam at least annually or more often if you have foot problems. Report any cuts, sores, or bruises to your health care provider immediately. SEEK MEDICAL CARE IF:   You have an injury that is not healing.  You have cuts or breaks in the skin.  You have an ingrown nail.  You notice redness on your legs or feet.  You feel burning or tingling in your legs or feet.  You have pain or cramps in your legs and feet.  Your legs or feet are numb.  Your feet always feel cold. SEEK IMMEDIATE MEDICAL CARE IF:   There is increasing redness,   swelling, or pain in or around a wound.  There is a red line that goes up your leg.  Pus is coming from a wound.  You develop a fever or as directed by your health care provider.  You notice a bad smell coming from an ulcer or wound.   This information is not intended to replace advice given to you by your health care provider. Make sure you discuss any questions you have with your health care provider.   Document Released: 03/14/2000 Document Revised: 11/17/2012 Document Reviewed: 08/24/2012 Elsevier Interactive Patient Education 2016 [employer]  

## 2015-07-12 ENCOUNTER — Encounter: Payer: Self-pay | Admitting: Family Medicine

## 2015-07-12 ENCOUNTER — Ambulatory Visit: Payer: No Typology Code available for payment source | Attending: Internal Medicine | Admitting: Family Medicine

## 2015-07-12 DIAGNOSIS — E119 Type 2 diabetes mellitus without complications: Secondary | ICD-10-CM | POA: Insufficient documentation

## 2015-07-12 DIAGNOSIS — Z7984 Long term (current) use of oral hypoglycemic drugs: Secondary | ICD-10-CM | POA: Insufficient documentation

## 2015-07-12 DIAGNOSIS — Z79899 Other long term (current) drug therapy: Secondary | ICD-10-CM | POA: Insufficient documentation

## 2015-07-12 DIAGNOSIS — R05 Cough: Secondary | ICD-10-CM

## 2015-07-12 DIAGNOSIS — K219 Gastro-esophageal reflux disease without esophagitis: Secondary | ICD-10-CM | POA: Insufficient documentation

## 2015-07-12 DIAGNOSIS — Z6841 Body Mass Index (BMI) 40.0 and over, adult: Secondary | ICD-10-CM | POA: Insufficient documentation

## 2015-07-12 DIAGNOSIS — Z7982 Long term (current) use of aspirin: Secondary | ICD-10-CM | POA: Insufficient documentation

## 2015-07-12 DIAGNOSIS — M1990 Unspecified osteoarthritis, unspecified site: Secondary | ICD-10-CM | POA: Insufficient documentation

## 2015-07-12 DIAGNOSIS — I10 Essential (primary) hypertension: Secondary | ICD-10-CM

## 2015-07-12 DIAGNOSIS — E1169 Type 2 diabetes mellitus with other specified complication: Secondary | ICD-10-CM

## 2015-07-12 DIAGNOSIS — R350 Frequency of micturition: Secondary | ICD-10-CM | POA: Insufficient documentation

## 2015-07-12 DIAGNOSIS — R509 Fever, unspecified: Secondary | ICD-10-CM | POA: Insufficient documentation

## 2015-07-12 DIAGNOSIS — Z7722 Contact with and (suspected) exposure to environmental tobacco smoke (acute) (chronic): Secondary | ICD-10-CM | POA: Insufficient documentation

## 2015-07-12 DIAGNOSIS — R059 Cough, unspecified: Secondary | ICD-10-CM

## 2015-07-12 DIAGNOSIS — E669 Obesity, unspecified: Secondary | ICD-10-CM | POA: Insufficient documentation

## 2015-07-12 LAB — BASIC METABOLIC PANEL
BUN: 11 mg/dL (ref 7–25)
CALCIUM: 9.2 mg/dL (ref 8.6–10.4)
CO2: 25 mmol/L (ref 20–31)
Chloride: 102 mmol/L (ref 98–110)
Creat: 0.79 mg/dL (ref 0.50–1.05)
Glucose, Bld: 88 mg/dL (ref 65–99)
POTASSIUM: 3.8 mmol/L (ref 3.5–5.3)
SODIUM: 140 mmol/L (ref 135–146)

## 2015-07-12 MED ORDER — OMEPRAZOLE 20 MG PO CPDR: 20.0000 mg | DELAYED_RELEASE_CAPSULE | Freq: Two times a day (BID) | ORAL | Status: DC

## 2015-07-12 MED ORDER — METFORMIN HCL 850 MG PO TABS: 850.0000 mg | ORAL_TABLET | Freq: Every morning | ORAL | Status: DC

## 2015-07-12 MED ORDER — SPIRONOLACTONE 25 MG PO TABS: 25.0000 mg | ORAL_TABLET | Freq: Every day | ORAL | Status: DC

## 2015-07-12 MED ORDER — ATENOLOL 100 MG PO TABS: 100.0000 mg | ORAL_TABLET | Freq: Every morning | ORAL | Status: DC

## 2015-07-12 MED ORDER — GUAIFENESIN 100 MG/5ML PO SOLN: 5.0000 mL | ORAL | Status: DC | PRN

## 2015-07-12 MED ORDER — GUAIFENESIN-CODEINE 100-10 MG/5ML PO SOLN: 5.0000 mL | Freq: Three times a day (TID) | ORAL | Status: DC | PRN

## 2015-07-12 MED ORDER — CETIRIZINE HCL 10 MG PO TABS
10.0000 mg | ORAL_TABLET | Freq: Every day | ORAL | Status: DC
Start: 2015-07-12 — End: 2015-07-24

## 2015-07-12 MED ORDER — CLONIDINE HCL 0.2 MG PO TABS: 0.2000 mg | ORAL_TABLET | Freq: Three times a day (TID) | ORAL | Status: DC

## 2015-07-12 MED FILL — ?CETIRIZINE HCL 10 MG TABLE: 10 | 30 days supply | Qty: 30 | Fill #0

## 2015-07-12 MED FILL — ROBAFEN 100 MG/5 ML SYRUP: 100 | 4 days supply | Qty: 120 | Fill #0

## 2015-07-12 MED FILL — ?SPIRONOLACTONE 25 MG TABLE: 25 | 30 days supply | Qty: 30 | Fill #0

## 2015-07-12 MED FILL — ?CLONIDINE HCL 0.2 MG TABLE: 0.2 MG | 30 days supply | Qty: 90 | Fill #0

## 2015-07-12 MED FILL — ATENOLOL 50 MG TABLET: 50 | 30 days supply | Qty: 60 | Fill #0

## 2015-07-12 NOTE — Progress Notes (Signed)
Dry cough x 1 week  Pain scale # 10 knee and leg pain No tobacco user  No suicidal thoughts in the past two weeks

## 2015-07-12 NOTE — Assessment & Plan Note (Signed)
A; uncontrolled HTN P: Stop lasix Add aldactone continue clonidine, atenolol and losartan BMP today Close f/u in 4 weeks for recheck K+ and BP check

## 2015-07-12 NOTE — Patient Instructions (Addendum)
Desera was seen today for cough.  Diagnoses and all orders for this visit:  Cough -     cetirizine (ZYRTEC) 10 MG tablet; Take 1 tablet (10 mg total) by mouth daily. -     Discontinue: guaiFENesin (ROBITUSSIN) 100 MG/5ML SOLN; Take 5 mLs (100 mg total) by mouth every 4 (four) hours as needed for cough or to loosen phlegm. -     Discontinue: guaiFENesin (ROBITUSSIN) 100 MG/5ML SOLN; Take 5 mLs (100 mg total) by mouth every 4 (four) hours as needed for cough or to loosen phlegm. -     Discontinue: guaiFENesin (ROBITUSSIN) 100 MG/5ML SOLN; Take 5 mLs (100 mg total) by mouth every 4 (four) hours as needed for cough or to loosen phlegm. -     guaiFENesin-codeine 100-10 MG/5ML syrup; Take 5 mLs by mouth 3 (three) times daily as needed for cough.  Diabetes mellitus type 2 in obese [facility]) -     Ambulatory referral to Ophthalmology -     metFORMIN (GLUCOPHAGE) 850 MG tablet; Take 1 tablet (850 mg total) by mouth every morning.  Essential hypertension -     spironolactone (ALDACTONE) 25 MG tablet; Take 1 tablet (25 mg total) by mouth daily. -     cloNIDine (CATAPRES) 0.2 MG tablet; Take 1 tablet (0.2 mg total) by mouth 3 (three) times daily. -     atenolol (TENORMIN) 100 MG tablet; Take 1 tablet (100 mg total) by mouth every morning.  Gastroesophageal reflux disease, esophagitis presence not specified -     omeprazole (PRILOSEC) 20 MG capsule; Take 1 capsule (20 mg total) by mouth 2 (two) times daily before a meal.   STOP lasix  Replace with aldactone  Add zyrtec for treatment of chronic cough. Keep pulmonology referral.   Please f/u in 4 weeks for cough and diabetes   Dr. Adrian Blackwater

## 2015-07-12 NOTE — Assessment & Plan Note (Signed)
Chronic cough in patient with known hx of allergies. Suspect allergy mediated cough.   Refilled guaifenesin with codeine Add zyrtec

## 2015-07-12 NOTE — Progress Notes (Signed)
Subjective:  Patient ID: Sabrina [name], female    DOB: November 17, 1960  Age: 55 y.o. MRN: [id number]  CC: Cough   HPI Sabrina [name] has obesity, HTN, diabetes, arthritis  presents for   1. Chronic cough: x 6 months. Improved with guaifenesin AC. She request a refill. Did not improve with tessalon perles. She has had a normal CXR. Cough is sometimes productive of white sputums. She reports fever, chills, night sweats. No weight. No exposure to sick person or someone travelling outside of the country. She does have reflux and takes omeprazole 20 mg daily. She admits to cat allergy, nickel allergy, seasonal allergies.   2. CHRONIC HYPERTENSION  Disease Monitoring  Blood pressure range: not checking   Chest pain: no   Dyspnea: no   Claudication: no   Medication compliance: yes  Medication Side Effects  Lightheadedness: no   Urinary frequency: yes, on lasix   Edema: yes   Impotence:    Social History  Substance Use Topics  . Smoking status: Passive Smoke Exposure - Never Smoker  . Smokeless tobacco: Never Used     Comment: Mother & Grandfather.  . Alcohol Use: No   Outpatient Prescriptions Prior to Visit  Medication Sig Dispense Refill  . aspirin EC 81 MG tablet Take 1 tablet (81 mg total) by mouth daily.    Marland Kitchen atenolol (TENORMIN) 100 MG tablet Take 100 mg by mouth every morning.    . benzonatate (TESSALON) 100 MG capsule Take 1-2 capsules (100-200 mg total) by mouth 3 (three) times daily as needed for cough. 120 capsule 1  . cloNIDine (CATAPRES) 0.2 MG tablet Take 1 tablet (0.2 mg total) by mouth 3 (three) times daily. 90 tablet 1  . divalproex (DEPAKOTE ER) 500 MG 24 hr tablet Take 1,000 mg by mouth at bedtime.     . furosemide (LASIX) 20 MG tablet TAKE 1 TABLET BY MOUTH DAILY. 30 tablet 2  . ibuprofen (ADVIL,MOTRIN) 600 MG tablet Take 600 mg by mouth every 6 (six) hours as needed for mild pain or moderate pain.    Marland Kitchen losartan (COZAAR) 100 MG tablet Take 1 tablet (100 mg total)  by mouth daily. 90 tablet 3  . metFORMIN (GLUCOPHAGE) 850 MG tablet TAKE 1 TABLET BY MOUTH EVERY MORNING. 30 tablet 2  . nitroGLYCERIN (NITROSTAT) 0.4 MG SL tablet Place 1 tablet (0.4 mg total) under the tongue every 5 (five) minutes as needed for chest pain. 25 tablet 3  . omeprazole (PRILOSEC) 20 MG capsule Take 20 mg by mouth daily.     Marland Kitchen oxybutynin (DITROPAN) 5 MG tablet TAKE 1 TABLET BY MOUTH TWICE DAILY. 60 tablet 3  . ranitidine (ZANTAC) 150 MG tablet Take 1 tablet (150 mg total) by mouth at bedtime. 30 tablet 3  . traZODone (DESYREL) 50 MG tablet Take 25 mg by mouth daily as needed for sleep (sleep).      No facility-administered medications prior to visit.    ROS Review of Systems  Constitutional: Negative for fever and chills.  Eyes: Negative for visual disturbance.  Respiratory: Positive for cough. Negative for shortness of breath.   Cardiovascular: Positive for leg swelling. Negative for chest pain.  Gastrointestinal: Negative for abdominal pain and blood in stool.  Genitourinary:       Incontinence   Musculoskeletal: Negative for back pain and arthralgias.  Skin: Negative for rash.  Allergic/Immunologic: Negative for immunocompromised state.  Hematological: Negative for adenopathy. Does not bruise/bleed easily.  Psychiatric/Behavioral: Negative for suicidal  ideas and dysphoric mood.    Objective:  BP 179/83 mmHg  Pulse 65  Temp(Src) 98.9 F (37.2 C) (Oral)  Resp 16  Ht 5\' 4"  (1.626 m)  Wt 261 lb (118.389 kg)  BMI 44.78 kg/m2  SpO2 96%  BP/Weight 07/12/2015 [id number]  Systolic BP 0000000 [id number]  Diastolic BP 83 84 84  Wt. (Lbs) 261 - 259  BMI 44.78 - 44.44   Physical Exam  Constitutional: She is oriented to person, place, and time. She appears well-developed and well-nourished. No distress.  HENT:  Head: Normocephalic and atraumatic.  Right Ear: Tympanic membrane, external ear and ear canal normal.  Left Ear: Tympanic membrane, external ear and ear  canal normal.  Mouth/Throat: Oropharynx is clear and moist and mucous membranes are normal.  Cardiovascular: Normal rate, regular rhythm, normal heart sounds and intact distal pulses.   Pulmonary/Chest: Effort normal and breath sounds normal.  Musculoskeletal: She exhibits no edema.  Lymphadenopathy:    She has no cervical adenopathy.  Neurological: She is alert and oriented to person, place, and time.  Skin: Skin is warm and dry. No rash noted.  Psychiatric: She has a normal mood and affect.   Assessment & Plan:   There are no diagnoses linked to this encounter. Sabrina [name] was seen today for cough.  Diagnoses and all orders for this visit:  Cough -     cetirizine (ZYRTEC) 10 MG tablet; Take 1 tablet (10 mg total) by mouth daily. -     Discontinue: guaiFENesin (ROBITUSSIN) 100 MG/5ML SOLN; Take 5 mLs (100 mg total) by mouth every 4 (four) hours as needed for cough or to loosen phlegm. -     Discontinue: guaiFENesin (ROBITUSSIN) 100 MG/5ML SOLN; Take 5 mLs (100 mg total) by mouth every 4 (four) hours as needed for cough or to loosen phlegm. -     Discontinue: guaiFENesin (ROBITUSSIN) 100 MG/5ML SOLN; Take 5 mLs (100 mg total) by mouth every 4 (four) hours as needed for cough or to loosen phlegm. -     guaiFENesin-codeine 100-10 MG/5ML syrup; Take 5 mLs by mouth 3 (three) times daily as needed for cough.  Diabetes mellitus type 2 in obese [facility]) -     Ambulatory referral to Ophthalmology -     metFORMIN (GLUCOPHAGE) 850 MG tablet; Take 1 tablet (850 mg total) by mouth every morning.  Essential hypertension -     spironolactone (ALDACTONE) 25 MG tablet; Take 1 tablet (25 mg total) by mouth daily. -     cloNIDine (CATAPRES) 0.2 MG tablet; Take 1 tablet (0.2 mg total) by mouth 3 (three) times daily. -     atenolol (TENORMIN) 100 MG tablet; Take 1 tablet (100 mg total) by mouth every morning. -     Basic Metabolic Panel  Gastroesophageal reflux disease, esophagitis presence not specified -      omeprazole (PRILOSEC) 20 MG capsule; Take 1 capsule (20 mg total) by mouth 2 (two) times daily before a meal.   Meds ordered this encounter  Medications  . cetirizine (ZYRTEC) 10 MG tablet    Sig: Take 1 tablet (10 mg total) by mouth daily.    Dispense:  30 tablet    Refill:  11  . DISCONTD: guaiFENesin (ROBITUSSIN) 100 MG/5ML SOLN    Sig: Take 5 mLs (100 mg total) by mouth every 4 (four) hours as needed for cough or to loosen phlegm.    Dispense:  120 mL    Refill:  0  .  DISCONTD: guaiFENesin (ROBITUSSIN) 100 MG/5ML SOLN    Sig: Take 5 mLs (100 mg total) by mouth every 4 (four) hours as needed for cough or to loosen phlegm.    Dispense:  120 mL    Refill:  0  . DISCONTD: guaiFENesin (ROBITUSSIN) 100 MG/5ML SOLN    Sig: Take 5 mLs (100 mg total) by mouth every 4 (four) hours as needed for cough or to loosen phlegm.    Dispense:  120 mL    Refill:  0  . guaiFENesin-codeine 100-10 MG/5ML syrup    Sig: Take 5 mLs by mouth 3 (three) times daily as needed for cough.    Dispense:  120 mL    Refill:  0  . spironolactone (ALDACTONE) 25 MG tablet    Sig: Take 1 tablet (25 mg total) by mouth daily.    Dispense:  90 tablet    Refill:  3  . cloNIDine (CATAPRES) 0.2 MG tablet    Sig: Take 1 tablet (0.2 mg total) by mouth 3 (three) times daily.    Dispense:  90 tablet    Refill:  1  . atenolol (TENORMIN) 100 MG tablet    Sig: Take 1 tablet (100 mg total) by mouth every morning.    Dispense:  30 tablet    Refill:  11  . metFORMIN (GLUCOPHAGE) 850 MG tablet    Sig: Take 1 tablet (850 mg total) by mouth every morning.    Dispense:  30 tablet    Refill:  5  . omeprazole (PRILOSEC) 20 MG capsule    Sig: Take 1 capsule (20 mg total) by mouth 2 (two) times daily before a meal.    Dispense:  60 capsule    Refill:  5    Follow-up: No Follow-up on file.   Boykin Nearing MD

## 2015-07-17 ENCOUNTER — Encounter: Payer: No Typology Code available for payment source | Admitting: Gastroenterology

## 2015-07-23 ENCOUNTER — Telehealth: Payer: Self-pay | Admitting: Family Medicine

## 2015-07-23 DIAGNOSIS — R059 Cough, unspecified: Secondary | ICD-10-CM

## 2015-07-23 DIAGNOSIS — R05 Cough: Secondary | ICD-10-CM

## 2015-07-23 DIAGNOSIS — M25561 Pain in right knee: Secondary | ICD-10-CM

## 2015-07-23 DIAGNOSIS — M25562 Pain in left knee: Principal | ICD-10-CM

## 2015-07-23 NOTE — Telephone Encounter (Signed)
Patient was diagnosed with arthritis in her knees and legs and is in pain. Patient would like to be prescribed a pain medicine.  Centirizine, patient states upsets her stomach. Patient would like to speak with a nurse or doctor

## 2015-07-24 ENCOUNTER — Ambulatory Visit (INDEPENDENT_AMBULATORY_CARE_PROVIDER_SITE_OTHER): Payer: No Typology Code available for payment source | Admitting: Pulmonary Disease

## 2015-07-24 ENCOUNTER — Ambulatory Visit: Payer: No Typology Code available for payment source | Admitting: Pulmonary Disease

## 2015-07-24 ENCOUNTER — Ambulatory Visit (HOSPITAL_COMMUNITY)
Admission: RE | Admit: 2015-07-24 | Discharge: 2015-07-24 | Disposition: A | Discharge status: Home / Self Care | Payer: No Typology Code available for payment source | Source: Ambulatory Visit | Attending: Pulmonary Disease | Admitting: Pulmonary Disease

## 2015-07-24 ENCOUNTER — Encounter: Payer: Self-pay | Admitting: Pulmonary Disease

## 2015-07-24 VITALS — BP 136/82 | HR 69 | Ht 64.0 in | Wt 259.0 lb

## 2015-07-24 DIAGNOSIS — R05 Cough: Secondary | ICD-10-CM | POA: Insufficient documentation

## 2015-07-24 DIAGNOSIS — R059 Cough, unspecified: Secondary | ICD-10-CM

## 2015-07-24 DIAGNOSIS — J3089 Other allergic rhinitis: Secondary | ICD-10-CM

## 2015-07-24 DIAGNOSIS — R0600 Dyspnea, unspecified: Secondary | ICD-10-CM

## 2015-07-24 LAB — PULMONARY FUNCTION TEST
DL/VA % PRED: 97 %
DL/VA: 4.67 ml/min/mmHg/L
DLCO UNC % PRED: 69 %
DLCO unc: 16.86 ml/min/mmHg
FEF 25-75 POST: 3.87 L/s
FEF 25-75 PRE: 2.89 L/s
FEF2575-%CHANGE-POST: 34 %
FEF2575-%PRED-POST: 166 %
FEF2575-%PRED-PRE: 124 %
FEV1-%Change-Post: 5 %
FEV1-%PRED-POST: 107 %
FEV1-%Pred-Pre: 101 %
FEV1-Post: 2.4 L
FEV1-Pre: 2.27 L
FEV1FVC-%Change-Post: 1 %
FEV1FVC-%PRED-PRE: 104 %
FEV6-%CHANGE-POST: 4 %
FEV6-%PRED-POST: 101 %
FEV6-%Pred-Pre: 97 %
FEV6-Post: 2.77 L
FEV6-Pre: 2.66 L
FEV6FVC-%PRED-PRE: 103 %
FEV6FVC-%Pred-Post: 103 %
FVC-%CHANGE-POST: 3 %
FVC-%Pred-Post: 98 %
FVC-%Pred-Pre: 94 %
FVC-Post: 2.77 L
FVC-Pre: 2.67 L
POST FEV1/FVC RATIO: 87 %
PRE FEV1/FVC RATIO: 85 %
Post FEV6/FVC ratio: 100 %
Pre FEV6/FVC Ratio: 100 %
RV % pred: 74 %
RV: 1.38 L
TLC % pred: 84 %
TLC: 4.28 L

## 2015-07-24 MED ORDER — ALBUTEROL SULFATE (2.5 MG/3ML) 0.083% IN NEBU
2.5000 mg | INHALATION_SOLUTION | Freq: Once | RESPIRATORY_TRACT | Status: AC
  Administered 2015-07-24: 2.5 mg via RESPIRATORY_TRACT

## 2015-07-24 MED ORDER — LORATADINE 10 MG PO TABS: 10.0000 mg | ORAL_TABLET | Freq: Every day | ORAL | Status: DC

## 2015-07-24 MED ORDER — ACETAMINOPHEN-CODEINE #3 300-30 MG PO TABS: 1.0000 | ORAL_TABLET | Freq: Three times a day (TID) | ORAL | Status: DC | PRN

## 2015-07-24 MED FILL — LORATADINE 10 MG TABLET: 10 | 30 days supply | Qty: 30 | Fill #0

## 2015-07-24 NOTE — Telephone Encounter (Signed)
Pt notified Stop cetirizine and start claritin as needed for  Allergies  Tylenol #3 at front office ready to be pick up use PRN for leg and knee pain  Pt verbalized understanding
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
## 2015-07-24 NOTE — Patient Instructions (Signed)
   Try using Claritin (loratadine), Zyrtec (cetirizine), or Allegra (fexofenadine) to help with your allergies and cough.  Please call my office if you have any worsening or your cough or new breathing problems before your next appointment.  I will see you back in 3-4 months or sooner if needed.

## 2015-07-24 NOTE — Telephone Encounter (Signed)
Please call patient Stop cetirizine, start claritin as needed for allergies  Tylenol ## for knee and leg pain

## 2015-07-25 ENCOUNTER — Encounter: Payer: No Typology Code available for payment source | Admitting: Gastroenterology

## 2015-07-25 MED FILL — ACETAMINOPHEN/COD #3 TABLET: 300-30 | 20 days supply | Qty: 60 | Fill #0

## 2015-07-27 ENCOUNTER — Ambulatory Visit (AMBULATORY_SURGERY_CENTER): Payer: Self-pay

## 2015-07-27 VITALS — Ht 64.0 in | Wt 264.4 lb

## 2015-07-27 DIAGNOSIS — Z1211 Encounter for screening for malignant neoplasm of colon: Secondary | ICD-10-CM

## 2015-07-27 MED ORDER — NA SULFATE-K SULFATE-MG SULF 17.5-3.13-1.6 GM/177ML PO SOLN: 1.0000 | Freq: Once | ORAL | Status: DC

## 2015-07-27 NOTE — Progress Notes (Signed)
No allergies to eggs or soy No past problems with anesthesia No diet meds No home oxygen  No internet Pt feel asleep often during interview

## 2015-07-28 ENCOUNTER — Encounter (HOSPITAL_COMMUNITY): Payer: Self-pay | Admitting: *Deleted

## 2015-07-28 DIAGNOSIS — Z880 Allergy status to penicillin: Secondary | ICD-10-CM | POA: Insufficient documentation

## 2015-07-28 DIAGNOSIS — Z590 Homelessness: Secondary | ICD-10-CM | POA: Insufficient documentation

## 2015-07-28 DIAGNOSIS — I10 Essential (primary) hypertension: Secondary | ICD-10-CM | POA: Insufficient documentation

## 2015-07-28 DIAGNOSIS — G43909 Migraine, unspecified, not intractable, without status migrainosus: Secondary | ICD-10-CM | POA: Insufficient documentation

## 2015-07-28 DIAGNOSIS — F319 Bipolar disorder, unspecified: Secondary | ICD-10-CM | POA: Insufficient documentation

## 2015-07-28 DIAGNOSIS — M069 Rheumatoid arthritis, unspecified: Secondary | ICD-10-CM | POA: Insufficient documentation

## 2015-07-28 DIAGNOSIS — Z4800 Encounter for change or removal of nonsurgical wound dressing: Secondary | ICD-10-CM | POA: Insufficient documentation

## 2015-07-28 DIAGNOSIS — Z79899 Other long term (current) drug therapy: Secondary | ICD-10-CM | POA: Insufficient documentation

## 2015-07-28 DIAGNOSIS — F419 Anxiety disorder, unspecified: Secondary | ICD-10-CM | POA: Insufficient documentation

## 2015-07-28 DIAGNOSIS — J449 Chronic obstructive pulmonary disease, unspecified: Secondary | ICD-10-CM | POA: Insufficient documentation

## 2015-07-28 DIAGNOSIS — M1990 Unspecified osteoarthritis, unspecified site: Secondary | ICD-10-CM | POA: Insufficient documentation

## 2015-07-28 DIAGNOSIS — K219 Gastro-esophageal reflux disease without esophagitis: Secondary | ICD-10-CM | POA: Insufficient documentation

## 2015-07-28 DIAGNOSIS — G8929 Other chronic pain: Secondary | ICD-10-CM | POA: Insufficient documentation

## 2015-07-28 DIAGNOSIS — E119 Type 2 diabetes mellitus without complications: Secondary | ICD-10-CM | POA: Insufficient documentation

## 2015-07-28 DIAGNOSIS — Z7984 Long term (current) use of oral hypoglycemic drugs: Secondary | ICD-10-CM | POA: Insufficient documentation

## 2015-07-28 DIAGNOSIS — Z7982 Long term (current) use of aspirin: Secondary | ICD-10-CM | POA: Insufficient documentation

## 2015-07-28 DIAGNOSIS — M109 Gout, unspecified: Secondary | ICD-10-CM | POA: Insufficient documentation
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
## 2015-07-29 ENCOUNTER — Emergency Department (HOSPITAL_COMMUNITY)
Admission: EM | Admit: 2015-07-29 | Discharge: 2015-07-29 | Disposition: A | Discharge status: Home / Self Care | Payer: No Typology Code available for payment source | Attending: Emergency Medicine | Admitting: Emergency Medicine

## 2015-07-29 DIAGNOSIS — Z5189 Encounter for other specified aftercare: Secondary | ICD-10-CM

## 2015-07-29 NOTE — Discharge Instructions (Signed)
[employer] [facility] [employer] [address] is a great source of information about community services available.  Access by dialing 2-1-1 from anywhere in [place], or by website -  CustodianSupply.fi.   Other Local Resources (Updated 04/2015)  [place]   Phone Number and Address  [place] for homeless and needy men with substance abuse issues [phone] [address] of [place]  Emergency assistance  [employer]  Pantry services [phone] [place], [place]  Domestic violence shelter for women and their children [place], [place]  Domestic violence shelter for women and their children [place], [place] [facility])     The [facility] coordinates access to most shelters in [place] in person Monday - Friday, 10 am - 4 pm.    After hours/ weekends, contact individual shelters directly [phone] [address], [place]  [facility] [place]  Emergency financial assistance  Permanent supportive housing [phone] [address], [place]   [employer]   Crisis assistance  Medication  Housing  Food  Utility assistance [phone] [place], [place]    Crisis assistance  Medication  Housing  Food  Utility assistance [place], [place], [place]  [facility] [employer] of [place]     Transitional housing  Case Psychiatric nurse assistance [phone] [address], [place], [employer] for adult men and women [phone] [address], [place] for those Facing Homelessness     [phone]

## 2015-07-29 NOTE — ED Notes (Signed)
Pt stable and NAD. Pt verbalized understanding of d/c instructions and has no further questions.
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
## 2015-07-30 ENCOUNTER — Other Ambulatory Visit: Payer: Self-pay | Admitting: Physician Assistant

## 2015-07-30 DIAGNOSIS — R0789 Other chest pain: Secondary | ICD-10-CM

## 2015-07-30 DIAGNOSIS — R9439 Abnormal result of other cardiovascular function study: Secondary | ICD-10-CM

## 2015-08-03 ENCOUNTER — Other Ambulatory Visit: Payer: Self-pay | Admitting: *Deleted

## 2015-08-03 DIAGNOSIS — R9439 Abnormal result of other cardiovascular function study: Secondary | ICD-10-CM

## 2015-08-03 DIAGNOSIS — R0789 Other chest pain: Secondary | ICD-10-CM

## 2015-08-03 MED FILL — ?OMEPRAZOLE DR 20 MG CAPSUL: 20 | 30 days supply | Qty: 30 | Fill #6

## 2015-08-03 NOTE — Telephone Encounter (Signed)
Ok to refill? I do not see where Dr Johnsie Cancel has ever filled this for the patient. Please advise. Thanks, MI

## 2015-08-06 MED ORDER — ASPIRIN EC 81 MG PO TBEC: 81.0000 mg | DELAYED_RELEASE_TABLET | Freq: Every day | ORAL | Status: DC

## 2015-08-07 ENCOUNTER — Ambulatory Visit: Payer: No Typology Code available for payment source | Admitting: Cardiovascular Disease

## 2015-08-08 ENCOUNTER — Encounter: Payer: Self-pay | Admitting: Gastroenterology

## 2015-08-08 ENCOUNTER — Ambulatory Visit (AMBULATORY_SURGERY_CENTER): Payer: No Typology Code available for payment source | Admitting: Gastroenterology

## 2015-08-08 VITALS — BP 150/97 | HR 82 | Temp 96.0°F | Resp 16 | Ht 64.0 in | Wt 264.0 lb

## 2015-08-08 DIAGNOSIS — Z1211 Encounter for screening for malignant neoplasm of colon: Secondary | ICD-10-CM

## 2015-08-08 LAB — GLUCOSE, CAPILLARY
GLUCOSE-CAPILLARY: 104 mg/dL — AB (ref 65–99)
Glucose-Capillary: 91 mg/dL (ref 65–99)

## 2015-08-08 MED ORDER — SODIUM CHLORIDE 0.9 % IV SOLN: 500.0000 mL | INTRAVENOUS | Status: DC

## 2015-08-08 NOTE — Op Note (Signed)
[place] Patient Name: Sabrina [name]: 08/08/2015 7:21 AM MRN: [id number] Endoscopist: Sabrina [name] , MD Age: 55 Sabrina [name]: 06/06/1960 Gender: Female Procedure:                Colonoscopy Indications:              Screening for malignant neoplasm in the colon, This                            is the patient's first colonoscopy Medicines:                Monitored Anesthesia Care Procedure:                Pre-Anesthesia Assessment:                           - Prior to the procedure, a History and Physical                            was performed, and patient medications and                            allergies were reviewed. The patient's tolerance of                            previous anesthesia was also reviewed. The risks                            and benefits of the procedure and the sedation                            options and risks were discussed with the patient.                            All questions were answered, and informed consent                            was obtained. Prior Anticoagulants: The patient has                            taken aspirin, last dose was 1 day prior to                            procedure. ASA Grade Assessment: III - A patient                            with severe systemic disease. After reviewing the                            risks and benefits, the patient was deemed in                            satisfactory condition to undergo the procedure.  After obtaining informed consent, the colonoscope                            was passed under direct vision. Throughout the                            procedure, the patient's blood pressure, pulse, and                            oxygen saturations were monitored continuously. The                            Model CF-HQ190L [id number]) scope was introduced                            through the anus and advanced to the the cecum,                         identified by appendiceal orifice and ileocecal                            valve. The colonoscopy was performed without                            difficulty. The patient tolerated the procedure                            well. The quality of the bowel preparation was                            adequate. The ileocecal valve, appendiceal orifice,                            and rectum were photographed. Scope In: 8:07:19 AM Scope Out: [id number] AM Scope Withdrawal Time: 0 hours 10 minutes 13 seconds  Total Procedure Duration: 0 hours 12 minutes 0 seconds  Findings:                 The perianal and digital rectal examinations were                            normal.                           The entire examined colon appeared normal. No                            polyps or mass lesions appreciated. Complications:            No immediate complications. Estimated blood loss:                            None. Estimated Blood Loss:     Estimated blood loss: none. Impression:               - The entire examined colon is normal.                           -  No specimens collected. Recommendation:           - Patient has a contact number available for                            emergencies. The signs and symptoms of potential                            delayed complications were discussed with the                            patient. Return to normal activities tomorrow.                            Written discharge instructions were provided to the                            patient.                           - Resume previous diet.                           - Continue present medications.                           - Repeat colonoscopy in 10 years for screening                            purposes. Sabrina Lipps P. Sanvi Ehler, MD 08/08/2015 8:22:04 AM This report has been signed electronically.

## 2015-08-08 NOTE — Progress Notes (Signed)
Transferred to recovery vss report to [employer]

## 2015-08-08 NOTE — Patient Instructions (Signed)
YOU HAD AN ENDOSCOPIC PROCEDURE TODAY AT [facility]:   Refer to the procedure report that was given to you for any specific questions about what was found during the examination.  If the procedure report does not answer your questions, please call your gastroenterologist to clarify.  If you requested that your care partner not be given the details of your procedure findings, then the procedure report has been included in a sealed envelope for you to review at your convenience later.  YOU SHOULD EXPECT: Some feelings of bloating in the abdomen. Passage of more gas than usual.  Walking can help get rid of the air that was put into your GI tract during the procedure and reduce the bloating. If you had a lower endoscopy (such as a colonoscopy or flexible sigmoidoscopy) you may notice spotting of blood in your stool or on the toilet paper. If you underwent a bowel prep for your procedure, you may not have a normal bowel movement for a few days.  Please Note:  You might notice some irritation and congestion in your nose or some drainage.  This is from the oxygen used during your procedure.  There is no need for concern and it should clear up in a day or so.  SYMPTOMS TO REPORT IMMEDIATELY:   Following lower endoscopy (colonoscopy or flexible sigmoidoscopy):  Excessive amounts of blood in the stool  Significant tenderness or worsening of abdominal pains  Swelling of the abdomen that is new, acute  Fever of 100F or higher   For urgent or emergent issues, a gastroenterologist can be reached at any hour by calling [phone].   DIET: Your first meal following the procedure should be a small meal and then it is ok to progress to your normal diet. Heavy or fried foods are harder to digest and may make you feel nauseous or bloated.  Likewise, meals heavy in dairy and vegetables can increase bloating.  Drink plenty of fluids but you should avoid alcoholic beverages for 24  hours.  ACTIVITY:  You should plan to take it easy for the rest of today and you should NOT DRIVE or use heavy machinery until tomorrow (because of the sedation medicines used during the test).    FOLLOW UP: Our staff will call the number listed on your records the next business day following your procedure to check on you and address any questions or concerns that you may have regarding the information given to you following your procedure. If we do not reach you, we will leave a message.  However, if you are feeling well and you are not experiencing any problems, there is no need to return our call.  We will assume that you have returned to your regular daily activities without incident.  If any biopsies were taken you will be contacted by phone or by letter within the next 1-3 weeks.  Please call us at [phone] if you have not heard about the biopsies in 3 weeks.    SIGNATURES/CONFIDENTIALITY: You and/or your care partner have signed paperwork which will be entered into your electronic medical record.  These signatures attest to the fact that that the information above on your After Visit Summary has been reviewed and is understood.  Full responsibility of the confidentiality of this discharge information lies with you and/or your care-partner.  Next colonoscopy 10 yearss-2027

## 2015-08-09 ENCOUNTER — Telehealth: Payer: Self-pay

## 2015-08-09 NOTE — Telephone Encounter (Signed)
  Follow up Call-  Call back number 08/08/2015  Post procedure Call Back phone  # [phone]  Permission to leave phone message Yes     Patient questions:  Do you have a fever, pain , or abdominal swelling? No. Pain Score  0 *  Have you tolerated food without any problems? Yes.    Have you been able to return to your normal activities? Yes.    Do you have any questions about your discharge instructions: Diet   No. Medications  No. Follow up visit  No.  Do you have questions or concerns about your Care? No.  Actions: * If pain score is 4 or above: No action needed, pain <4.

## 2015-08-14 MED FILL — OXYBUTYNIN 5 MG TABLET: 5 | 30 days supply | Qty: 60 | Fill #1

## 2015-08-14 MED FILL — FUROSEMIDE 20 MG TABLET: 20 | 30 days supply | Qty: 30 | Fill #1

## 2015-08-14 MED FILL — ?METFORMIN HCL 850 MG TABLE: 850 | 30 days supply | Qty: 30 | Fill #0

## 2015-08-15 ENCOUNTER — Encounter: Payer: Self-pay | Admitting: Family Medicine

## 2015-08-15 DIAGNOSIS — I10 Essential (primary) hypertension: Secondary | ICD-10-CM

## 2015-08-15 DIAGNOSIS — E119 Type 2 diabetes mellitus without complications: Secondary | ICD-10-CM

## 2015-08-21 ENCOUNTER — Ambulatory Visit: Payer: No Typology Code available for payment source | Attending: Family Medicine

## 2015-08-22 MED ORDER — ATENOLOL 100 MG PO TABS: 100.0000 mg | ORAL_TABLET | Freq: Every day | ORAL | Status: DC

## 2015-08-22 MED ORDER — BENZONATATE 100 MG PO CAPS: 200.0000 mg | ORAL_CAPSULE | Freq: Three times a day (TID) | ORAL | Status: DC | PRN

## 2015-08-22 MED ORDER — SPIRONOLACTONE 50 MG PO TABS: 50.0000 mg | ORAL_TABLET | Freq: Every day | ORAL | Status: DC

## 2015-08-22 MED FILL — SPIRONOLACTONE 50 MG TABLET: 50 | 30 days supply | Qty: 30 | Fill #0

## 2015-08-22 MED FILL — ATENOLOL 50 MG TABLET: 50 | 30 days supply | Qty: 60 | Fill #0

## 2015-08-22 MED FILL — BENZONATATE 100 MG CAPSULE: 100 | 30 days supply | Qty: 90 | Fill #0

## 2015-08-22 NOTE — Addendum Note (Signed)
Addended by: Boykin Nearing on: 08/22/2015 12:12 PM   Modules accepted: Orders, Medications

## 2015-08-28 ENCOUNTER — Ambulatory Visit: Payer: No Typology Code available for payment source | Attending: Family Medicine

## 2015-08-28 DIAGNOSIS — E119 Type 2 diabetes mellitus without complications: Secondary | ICD-10-CM | POA: Insufficient documentation

## 2015-08-28 DIAGNOSIS — I10 Essential (primary) hypertension: Secondary | ICD-10-CM

## 2015-08-28 LAB — BASIC METABOLIC PANEL WITH GFR
BUN: 12 mg/dL (ref 7–25)
CHLORIDE: 102 mmol/L (ref 98–110)
CO2: 24 mmol/L (ref 20–31)
Calcium: 9.4 mg/dL (ref 8.6–10.4)
Creat: 0.79 mg/dL (ref 0.50–1.05)
GFR, EST NON AFRICAN AMERICAN: 86 mL/min (ref >=60)
GFR, Est African American: >89 mL/min (ref >=60)
GLUCOSE: 90 mg/dL (ref 65–99)
POTASSIUM: 4.3 mmol/L (ref 3.5–5.3)
Sodium: 136 mmol/L (ref 135–146)

## 2015-08-28 LAB — VITAMIN B12: Vitamin B-12: 688 pg/mL (ref 200–1100)

## 2015-09-03 ENCOUNTER — Other Ambulatory Visit: Payer: Self-pay | Admitting: Internal Medicine

## 2015-09-26 MED FILL — raNITIdine HCL 150 MG TABS: 150 | 30 days supply | Qty: 30 | Fill #1

## 2015-09-26 MED FILL — ATENOLOL 50 MG TABLET: 50 | 30 days supply | Qty: 60 | Fill #1

## 2015-09-26 MED FILL — FUROSEMIDE 20 MG TABLET: 20 | 30 days supply | Qty: 30 | Fill #2

## 2015-09-26 MED FILL — OXYBUTYNIN 5 MG TABLET: 5 | 30 days supply | Qty: 60 | Fill #2

## 2015-09-26 MED FILL — LOSARTAN POTASSIUM 100 MG T: 100 | 30 days supply | Qty: 30 | Fill #3

## 2015-09-26 MED FILL — ?CETIRIZINE HCL 10 MG TABLE: 10 | 30 days supply | Qty: 30 | Fill #1

## 2015-09-26 MED FILL — ?METFORMIN HCL 850 MG TABLE: 850 | 30 days supply | Qty: 30 | Fill #1

## 2015-09-26 MED FILL — ?CLONIDINE HCL 0.2 MG TABLE: 0.2 MG | 30 days supply | Qty: 90 | Fill #1

## 2015-10-11 ENCOUNTER — Encounter: Payer: Self-pay | Admitting: Family Medicine

## 2015-10-11 ENCOUNTER — Ambulatory Visit: Payer: No Typology Code available for payment source | Attending: Family Medicine | Admitting: Family Medicine

## 2015-10-11 VITALS — BP 145/68 | HR 62 | Temp 98.5°F | Resp 20 | Ht 64.0 in | Wt 266.0 lb

## 2015-10-11 DIAGNOSIS — M7989 Other specified soft tissue disorders: Secondary | ICD-10-CM

## 2015-10-11 DIAGNOSIS — E1169 Type 2 diabetes mellitus with other specified complication: Secondary | ICD-10-CM

## 2015-10-11 DIAGNOSIS — Z5900 Homelessness unspecified: Secondary | ICD-10-CM

## 2015-10-11 DIAGNOSIS — E669 Obesity, unspecified: Secondary | ICD-10-CM

## 2015-10-11 DIAGNOSIS — B372 Candidiasis of skin and nail: Secondary | ICD-10-CM | POA: Insufficient documentation

## 2015-10-11 DIAGNOSIS — I10 Essential (primary) hypertension: Secondary | ICD-10-CM

## 2015-10-11 DIAGNOSIS — E119 Type 2 diabetes mellitus without complications: Secondary | ICD-10-CM

## 2015-10-11 LAB — CBC
HCT: 41.5 % (ref 35.0–45.0)
Hemoglobin: 13.7 g/dL (ref 11.7–15.5)
MCH: 32.1 pg (ref 27.0–33.0)
MCHC: 33 g/dL (ref 32.0–36.0)
MCV: 97.2 fL (ref 80.0–100.0)
MPV: 12.3 fL (ref 7.5–12.5)
PLATELETS: 232 10*3/uL (ref 140–400)
RBC: 4.27 MIL/uL (ref 3.80–5.10)
RDW: 14.5 % (ref 11.0–15.0)
WBC: 7.5 10*3/uL (ref 3.8–10.8)

## 2015-10-11 LAB — GLUCOSE, POCT (MANUAL RESULT ENTRY): POC Glucose: 101 mg/dl — AB (ref 70–99)

## 2015-10-11 LAB — POCT GLYCOSYLATED HEMOGLOBIN (HGB A1C): Hemoglobin A1C: 5.6

## 2015-10-11 MED ORDER — LOSARTAN POTASSIUM 100 MG PO TABS: 100.0000 mg | ORAL_TABLET | Freq: Every day | ORAL | Status: DC

## 2015-10-11 MED ORDER — ATENOLOL 100 MG PO TABS: 100.0000 mg | ORAL_TABLET | Freq: Every day | ORAL | Status: DC

## 2015-10-11 MED ORDER — NYSTATIN 100000 UNIT/GM EX POWD: Freq: Three times a day (TID) | CUTANEOUS | Status: DC

## 2015-10-11 MED ORDER — CLONIDINE HCL 0.2 MG PO TABS: 0.2000 mg | ORAL_TABLET | Freq: Three times a day (TID) | ORAL | Status: DC

## 2015-10-11 MED ORDER — SPIRONOLACTONE 50 MG PO TABS: 50.0000 mg | ORAL_TABLET | Freq: Every day | ORAL | Status: DC

## 2015-10-11 MED FILL — NYSTOP 100,000 UNITS/GM PWD: 100000 | 20 days supply | Qty: 60 | Fill #0

## 2015-10-11 NOTE — Assessment & Plan Note (Signed)
Well-controlled.  Continue current regimen. 

## 2015-10-11 NOTE — Assessment & Plan Note (Signed)
A: elevated BP [id number] except for daily aldactone P: Continue current regimen

## 2015-10-11 NOTE — Assessment & Plan Note (Signed)
Intertrigo under breast Nystatin powder ordered

## 2015-10-11 NOTE — Progress Notes (Signed)
Patient is here for DM FU  Patient complains of icthing under the folds of her breast.

## 2015-10-11 NOTE — Patient Instructions (Addendum)
Sabrina [name] was seen today for diabetes.  Diagnoses and all orders for this visit:  Diabetes mellitus type 2 in obese (HCC) -     HgB A1c -     Glucose (CBG) -     Vitamin B12  Essential hypertension -     cloNIDine (CATAPRES) 0.2 MG tablet; Take 1 tablet (0.2 mg total) by mouth 3 (three) times daily. -     spironolactone (ALDACTONE) 50 MG tablet; Take 1 tablet (50 mg total) by mouth daily. -     atenolol (TENORMIN) 100 MG tablet; Take 1 tablet (100 mg total) by mouth daily. -     losartan (COZAAR) 100 MG tablet; Take 1 tablet (100 mg total) by mouth daily.  Candidal intertrigo -     nystatin (NYSTATIN) powder; Apply topically 3 (three) times daily. Apply under breast to treat skin yeast infection   Please go to the [facility] resource center to inquire about housing [address] [place], [place] [address]  Hours:  Open today  9AM-7PM  Phone: [phone]  [facility] [place], [place], [place] [address] Phone: [phone]   F/u in 3 months for HTN and diabetes  Dr. Adrian Blackwater

## 2015-10-11 NOTE — Assessment & Plan Note (Signed)
Community resources provided

## 2015-10-11 NOTE — Progress Notes (Signed)
Subjective:  Patient ID: Sabrina [name], female    DOB: 10-07-1960  Age: 55 y.o. MRN: [id number]  CC: Diabetes   HPI Sabrina [name] presents for diabetes follow up   1. Diabetes: not checking CBGs. Compliant with metformin. Has pain in toe. Has urinary frequency. No polydipsia.  2. HTN: no HA, CP, SOB. Some swelling in legs. Compliant with regimen except she does not take aldactone every day.  3. Homeless: living in car with partner. Not currently working. Hoping to move into an apartment soon.   4. Rash: under breast. With itching and pain for the past few months. No fever or chills.   Social History  Substance Use Topics  . Smoking status: Passive Smoke Exposure - Never Smoker  . Smokeless tobacco: Never Used     Comment: Mother & Grandfather.  . Alcohol Use: No    Outpatient Prescriptions Prior to Visit  Medication Sig Dispense Refill  . acetaminophen-codeine (TYLENOL #3) 300-30 MG tablet Take 1 tablet by mouth every 8 (eight) hours as needed for moderate pain. 60 tablet 0  . aspirin EC 81 MG tablet Take 1 tablet (81 mg total) by mouth daily.    Marland Kitchen atenolol (TENORMIN) 100 MG tablet Take 1 tablet (100 mg total) by mouth daily. 90 tablet 3  . benzonatate (TESSALON PERLES) 100 MG capsule Take 2 capsules (200 mg total) by mouth 3 (three) times daily as needed for cough. 90 capsule 3  . cloNIDine (CATAPRES) 0.2 MG tablet Take 1 tablet (0.2 mg total) by mouth 3 (three) times daily. 90 tablet 1  . divalproex (DEPAKOTE ER) 500 MG 24 hr tablet Take 750 mg by mouth at bedtime. Decreased from 1000 to 750    . ibuprofen (ADVIL,MOTRIN) 600 MG tablet Take 600 mg by mouth every 6 (six) hours as needed for mild pain or moderate pain.    Marland Kitchen loratadine (CLARITIN) 10 MG tablet Take 1 tablet (10 mg total) by mouth daily. 30 tablet 11  . losartan (COZAAR) 100 MG tablet Take 100 mg by mouth daily.  3  . metFORMIN (GLUCOPHAGE) 850 MG tablet Take 1 tablet (850 mg total) by mouth every morning. 30  tablet 5  . nitroGLYCERIN (NITROSTAT) 0.4 MG SL tablet Place 1 tablet (0.4 mg total) under the tongue every 5 (five) minutes as needed for chest pain. 25 tablet 3  . omeprazole (PRILOSEC) 20 MG capsule Take 1 capsule (20 mg total) by mouth 2 (two) times daily before a meal. (Patient taking differently: Take 20 mg by mouth daily. ) 60 capsule 5  . oxybutynin (DITROPAN) 5 MG tablet TAKE 1 TABLET BY MOUTH TWICE DAILY. 60 tablet 3  . spironolactone (ALDACTONE) 50 MG tablet Take 1 tablet (50 mg total) by mouth daily. 90 tablet 3  . traZODone (DESYREL) 50 MG tablet Take 25 mg by mouth daily as needed for sleep (sleep).      No facility-administered medications prior to visit.    ROS Review of Systems  Constitutional: Negative for fever and chills.  Eyes: Negative for visual disturbance.  Respiratory: Negative for shortness of breath.   Cardiovascular: Positive for leg swelling. Negative for chest pain.  Gastrointestinal: Negative for abdominal pain and blood in stool.  Musculoskeletal: Positive for arthralgias. Negative for back pain.  Skin: Positive for rash.  Allergic/Immunologic: Negative for immunocompromised state.  Hematological: Negative for adenopathy. Does not bruise/bleed easily.  Psychiatric/Behavioral: Negative for suicidal ideas and dysphoric mood.    Objective:  BP 145/68  mmHg  Pulse 62  Temp(Src) 98.5 F (36.9 C) (Oral)  Resp 20  Ht 5\' 4"  (1.626 m)  Wt 266 lb (120.657 kg)  BMI 45.64 kg/m2  SpO2 98%  BP/Weight 10/11/2015 08/08/2015 [id number]  Systolic BP Q000111Q Q000111Q -  Diastolic BP 68 97 -  Wt. (Lbs) 266 264 -  BMI 45.64 45.29 45.14   Wt Readings from Last 3 Encounters:  10/11/15 266 lb (120.657 kg)  08/08/15 264 lb (119.75 kg)  07/28/15 263 lb 2 oz (119.353 kg)     Physical Exam  Constitutional: She is oriented to person, place, and time. She appears well-developed and well-nourished. No distress.  HENT:  Head: Normocephalic and atraumatic.  Cardiovascular:  Normal rate, regular rhythm, normal heart sounds and intact distal pulses.   Pulmonary/Chest: Effort normal and breath sounds normal.    Musculoskeletal: She exhibits edema (1+ in b/l LE ).  Neurological: She is alert and oriented to person, place, and time.  Skin: Skin is warm and dry. Rash noted.  Psychiatric: She has a normal mood and affect.   Lab Results  Component Value Date   HGBA1C 5.6 10/11/2015      Assessment & Plan:   Sabrina [name] was seen today for diabetes.  Diagnoses and all orders for this visit:  Diabetes mellitus type 2 in obese (HCC) -     HgB A1c -     Glucose (CBG) -     Vitamin B12  Essential hypertension -     cloNIDine (CATAPRES) 0.2 MG tablet; Take 1 tablet (0.2 mg total) by mouth 3 (three) times daily. -     spironolactone (ALDACTONE) 50 MG tablet; Take 1 tablet (50 mg total) by mouth daily. -     atenolol (TENORMIN) 100 MG tablet; Take 1 tablet (100 mg total) by mouth daily. -     losartan (COZAAR) 100 MG tablet; Take 1 tablet (100 mg total) by mouth daily.  Candidal intertrigo -     nystatin (NYSTATIN) powder; Apply topically 3 (three) times daily. Apply under breast to treat skin yeast infection  Leg swelling -     CBC -     BASIC METABOLIC PANEL WITH GFR    No orders of the defined types were placed in this encounter.    Follow-up: Return in about 3 months (around 01/11/2016) for HTN and diabetes .   Boykin Nearing MD

## 2015-10-12 LAB — BASIC METABOLIC PANEL WITH GFR
BUN: 15 mg/dL (ref 7–25)
CALCIUM: 9.8 mg/dL (ref 8.6–10.4)
CHLORIDE: 103 mmol/L (ref 98–110)
CO2: 20 mmol/L (ref 20–31)
Creat: 0.97 mg/dL (ref 0.50–1.05)
GFR, EST NON AFRICAN AMERICAN: 67 mL/min (ref >=60)
GFR, Est African American: 77 mL/min (ref >=60)
GLUCOSE: 91 mg/dL (ref 65–99)
POTASSIUM: 4.3 mmol/L (ref 3.5–5.3)
SODIUM: 137 mmol/L (ref 135–146)

## 2015-10-12 LAB — VITAMIN B12: VITAMIN B 12: 713 pg/mL (ref 200–1100)

## 2015-10-23 ENCOUNTER — Ambulatory Visit: Payer: No Typology Code available for payment source | Admitting: Pulmonary Disease

## 2015-10-29 ENCOUNTER — Emergency Department (HOSPITAL_COMMUNITY)
Admission: EM | Admit: 2015-10-29 | Discharge: 2015-10-29 | Disposition: A | Discharge status: Home / Self Care | Payer: No Typology Code available for payment source | Attending: Emergency Medicine | Admitting: Emergency Medicine

## 2015-10-29 ENCOUNTER — Encounter (HOSPITAL_COMMUNITY): Payer: Self-pay | Admitting: Nurse Practitioner

## 2015-10-29 DIAGNOSIS — R112 Nausea with vomiting, unspecified: Secondary | ICD-10-CM | POA: Insufficient documentation

## 2015-10-29 DIAGNOSIS — Z91040 Latex allergy status: Secondary | ICD-10-CM | POA: Insufficient documentation

## 2015-10-29 DIAGNOSIS — Z7722 Contact with and (suspected) exposure to environmental tobacco smoke (acute) (chronic): Secondary | ICD-10-CM | POA: Insufficient documentation

## 2015-10-29 DIAGNOSIS — Z7982 Long term (current) use of aspirin: Secondary | ICD-10-CM | POA: Insufficient documentation

## 2015-10-29 DIAGNOSIS — R110 Nausea: Secondary | ICD-10-CM

## 2015-10-29 DIAGNOSIS — F319 Bipolar disorder, unspecified: Secondary | ICD-10-CM | POA: Insufficient documentation

## 2015-10-29 DIAGNOSIS — L0291 Cutaneous abscess, unspecified: Secondary | ICD-10-CM

## 2015-10-29 DIAGNOSIS — Z7984 Long term (current) use of oral hypoglycemic drugs: Secondary | ICD-10-CM | POA: Insufficient documentation

## 2015-10-29 DIAGNOSIS — R519 Headache, unspecified: Secondary | ICD-10-CM

## 2015-10-29 DIAGNOSIS — I10 Essential (primary) hypertension: Secondary | ICD-10-CM | POA: Insufficient documentation

## 2015-10-29 DIAGNOSIS — E119 Type 2 diabetes mellitus without complications: Secondary | ICD-10-CM | POA: Insufficient documentation

## 2015-10-29 DIAGNOSIS — L02219 Cutaneous abscess of trunk, unspecified: Secondary | ICD-10-CM | POA: Insufficient documentation

## 2015-10-29 DIAGNOSIS — Z79899 Other long term (current) drug therapy: Secondary | ICD-10-CM | POA: Insufficient documentation

## 2015-10-29 DIAGNOSIS — R51 Headache: Secondary | ICD-10-CM | POA: Insufficient documentation

## 2015-10-29 LAB — COMPREHENSIVE METABOLIC PANEL
ALT: 20 U/L (ref 14–54)
AST: 25 U/L (ref 15–41)
Albumin: 3.5 g/dL (ref 3.5–5.0)
Alkaline Phosphatase: 61 U/L (ref 38–126)
Anion gap: 8 (ref 5–15)
BUN: 10 mg/dL (ref 6–20)
CO2: 22 mmol/L (ref 22–32)
Calcium: 9.6 mg/dL (ref 8.9–10.3)
Chloride: 106 mmol/L (ref 101–111)
Creatinine, Ser: 0.91 mg/dL (ref 0.44–1.00)
GFR calc Af Amer: >60 mL/min (ref >60)
GFR calc non Af Amer: >60 mL/min (ref >60)
Glucose, Bld: 110 mg/dL — ABNORMAL HIGH (ref 65–99)
Potassium: 4.1 mmol/L (ref 3.5–5.1)
Sodium: 136 mmol/L (ref 135–145)
Total Bilirubin: 0.4 mg/dL (ref 0.3–1.2)
Total Protein: 7.4 g/dL (ref 6.5–8.1)

## 2015-10-29 LAB — URINALYSIS, ROUTINE W REFLEX MICROSCOPIC
Bilirubin Urine: NEGATIVE
Glucose, UA: NEGATIVE mg/dL
Hgb urine dipstick: NEGATIVE
Ketones, ur: NEGATIVE mg/dL
Leukocytes, UA: NEGATIVE
Nitrite: NEGATIVE
Protein, ur: NEGATIVE mg/dL
Specific Gravity, Urine: 1.02 (ref 1.005–1.030)
pH: 5.5 (ref 5.0–8.0)

## 2015-10-29 LAB — CBC
HCT: 36.1 % (ref 36.0–46.0)
Hemoglobin: 12 g/dL (ref 12.0–15.0)
MCH: 31 pg (ref 26.0–34.0)
MCHC: 33.2 g/dL (ref 30.0–36.0)
MCV: 93.3 fL (ref 78.0–100.0)
Platelets: 355 10*3/uL (ref 150–400)
RBC: 3.87 MIL/uL (ref 3.87–5.11)
RDW: 13.9 % (ref 11.5–15.5)
WBC: 13.3 10*3/uL — ABNORMAL HIGH (ref 4.0–10.5)

## 2015-10-29 MED ORDER — DOXYCYCLINE HYCLATE 100 MG PO CAPS: 100.0000 mg | ORAL_CAPSULE | Freq: Two times a day (BID) | ORAL | 0 refills | Status: DC

## 2015-10-29 MED ORDER — LIDOCAINE-EPINEPHRINE (PF) 2 %-1:200000 IJ SOLN
10.0000 mL | Freq: Once | INTRAMUSCULAR | Status: AC
  Administered 2015-10-29: 10 mL
  Filled 2015-10-29: qty 20

## 2015-10-29 MED ORDER — DOXYCYCLINE HYCLATE 100 MG PO TABS
100.0000 mg | ORAL_TABLET | Freq: Two times a day (BID) | ORAL | Status: DC
  Administered 2015-10-29: 100 mg via ORAL
  Filled 2015-10-29: qty 1

## 2015-10-29 MED ORDER — HYDROCODONE-ACETAMINOPHEN 5-325 MG PO TABS
2.0000 | ORAL_TABLET | Freq: Once | ORAL | Status: AC
  Administered 2015-10-29: 2 via ORAL
  Filled 2015-10-29: qty 2

## 2015-10-29 MED ORDER — FLUCONAZOLE 100 MG PO TABS
150.0000 mg | ORAL_TABLET | Freq: Once | ORAL | Status: AC
  Administered 2015-10-29: 150 mg via ORAL
  Filled 2015-10-29: qty 2

## 2015-10-29 MED ORDER — FLUCONAZOLE 150 MG PO TABS: 150.0000 mg | ORAL_TABLET | Freq: Once | ORAL | 0 refills | Status: AC

## 2015-10-29 NOTE — ED Triage Notes (Signed)
She c/o multiple issues. Chills, n/v/d, migraine headaCHE, boil to abdomen, bilateral foot pain. She is alert, breathing easily
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
## 2015-10-30 DIAGNOSIS — R079 Chest pain, unspecified: Secondary | ICD-10-CM

## 2015-10-30 HISTORY — DX: Chest pain, unspecified: R07.9

## 2015-10-30 MED FILL — DOXYCYCLINE 100 MG TABLET: 100 | 10 days supply | Qty: 20 | Fill #0

## 2015-10-30 MED FILL — FLUCONAZOLE 150 MG TABLET: 150 | 1 days supply | Qty: 1 | Fill #0

## 2015-11-05 ENCOUNTER — Ambulatory Visit: Payer: No Typology Code available for payment source | Admitting: Pulmonary Disease

## 2015-11-14 ENCOUNTER — Emergency Department (HOSPITAL_COMMUNITY): Payer: Self-pay

## 2015-11-14 ENCOUNTER — Encounter (HOSPITAL_COMMUNITY): Payer: Self-pay

## 2015-11-14 DIAGNOSIS — R079 Chest pain, unspecified: Secondary | ICD-10-CM | POA: Insufficient documentation

## 2015-11-14 DIAGNOSIS — Z7982 Long term (current) use of aspirin: Secondary | ICD-10-CM | POA: Insufficient documentation

## 2015-11-14 DIAGNOSIS — Z7984 Long term (current) use of oral hypoglycemic drugs: Secondary | ICD-10-CM | POA: Insufficient documentation

## 2015-11-14 DIAGNOSIS — I10 Essential (primary) hypertension: Secondary | ICD-10-CM | POA: Insufficient documentation

## 2015-11-14 DIAGNOSIS — Z91040 Latex allergy status: Secondary | ICD-10-CM | POA: Insufficient documentation

## 2015-11-14 DIAGNOSIS — Z7722 Contact with and (suspected) exposure to environmental tobacco smoke (acute) (chronic): Secondary | ICD-10-CM | POA: Insufficient documentation

## 2015-11-14 DIAGNOSIS — Z79899 Other long term (current) drug therapy: Secondary | ICD-10-CM | POA: Insufficient documentation

## 2015-11-14 DIAGNOSIS — E119 Type 2 diabetes mellitus without complications: Secondary | ICD-10-CM | POA: Insufficient documentation

## 2015-11-14 LAB — BASIC METABOLIC PANEL
Anion gap: 5 (ref 5–15)
BUN: 12 mg/dL (ref 6–20)
CHLORIDE: 109 mmol/L (ref 101–111)
CO2: 25 mmol/L (ref 22–32)
CREATININE: 0.97 mg/dL (ref 0.44–1.00)
Calcium: 9.4 mg/dL (ref 8.9–10.3)
GFR calc Af Amer: >60 mL/min (ref >60)
GFR calc non Af Amer: >60 mL/min (ref >60)
Glucose, Bld: 109 mg/dL — ABNORMAL HIGH (ref 65–99)
Potassium: 4 mmol/L (ref 3.5–5.1)
SODIUM: 139 mmol/L (ref 135–145)

## 2015-11-14 LAB — CBC
HCT: 36.2 % (ref 36.0–46.0)
Hemoglobin: 12.2 g/dL (ref 12.0–15.0)
MCH: 31.9 pg (ref 26.0–34.0)
MCHC: 33.7 g/dL (ref 30.0–36.0)
MCV: 94.8 fL (ref 78.0–100.0)
PLATELETS: 366 10*3/uL (ref 150–400)
RBC: 3.82 MIL/uL — ABNORMAL LOW (ref 3.87–5.11)
RDW: 14.3 % (ref 11.5–15.5)
WBC: 8.5 10*3/uL (ref 4.0–10.5)

## 2015-11-14 LAB — I-STAT TROPONIN, ED: Troponin i, poc: 0 ng/mL (ref 0.00–0.08)

## 2015-11-14 NOTE — ED Triage Notes (Signed)
Pt here with left sided chest pain/pressure that started today around 11am while she was outside walking around a parking lot associated with SOB and nausea. Denies emesis/diarrhea. She states her heart is racing. HR at triage 56. A&OX4.

## 2015-11-15 ENCOUNTER — Emergency Department (HOSPITAL_COMMUNITY)
Admission: EM | Admit: 2015-11-15 | Discharge: 2015-11-15 | Disposition: A | Discharge status: Home / Self Care | Payer: Self-pay | Attending: Emergency Medicine | Admitting: Emergency Medicine

## 2015-11-15 ENCOUNTER — Emergency Department (HOSPITAL_COMMUNITY): Payer: Self-pay

## 2015-11-15 DIAGNOSIS — R079 Chest pain, unspecified: Secondary | ICD-10-CM

## 2015-11-15 LAB — URINALYSIS, ROUTINE W REFLEX MICROSCOPIC
Bilirubin Urine: NEGATIVE
GLUCOSE, UA: NEGATIVE mg/dL
HGB URINE DIPSTICK: NEGATIVE
KETONES UR: NEGATIVE mg/dL
Leukocytes, UA: NEGATIVE
Nitrite: NEGATIVE
PH: 7 (ref 5.0–8.0)
PROTEIN: NEGATIVE mg/dL
Specific Gravity, Urine: 1.023 (ref 1.005–1.030)

## 2015-11-15 LAB — D-DIMER, QUANTITATIVE (NOT AT ARMC): D DIMER QUANT: 0.6 ug{FEU}/mL — AB (ref 0.00–0.50)

## 2015-11-15 LAB — I-STAT TROPONIN, ED: Troponin i, poc: 0 ng/mL (ref 0.00–0.08)

## 2015-11-15 MED ORDER — HYDROCODONE-ACETAMINOPHEN 5-325 MG PO TABS
1.0000 | ORAL_TABLET | Freq: Once | ORAL | Status: AC
  Administered 2015-11-15: 1 via ORAL
  Filled 2015-11-15: qty 1

## 2015-11-15 MED ORDER — IOPAMIDOL (ISOVUE-370) INJECTION 76%
INTRAVENOUS | Status: AC
  Administered 2015-11-15: 100 mL via INTRAVENOUS
  Filled 2015-11-15: qty 100

## 2015-11-15 NOTE — Discharge Instructions (Signed)
There is no evidence of heart attack or blood clot in your lung. Followup with your doctor. Return to the ED if you develop new or worsening symptoms.

## 2015-11-15 NOTE — ED Provider Notes (Signed)
Care assumed from Dr. Dina Rich. CT negative for pulmonary embolism or other acute pathology. Patient has 2 negative troponins in setting of negative cardiac catheterization one year ago. She is ruled out for acute MI.  Patient is stable for discharge home. Return precautions discussed.   BP 175/88 (BP Location: Left Arm)   Pulse (!) 53   Temp 98 F (36.7 C) (Oral)   Resp 20   SpO2 99%     Ezequiel Essex, MD 11/15/15 1011

## 2015-11-15 NOTE — ED Notes (Signed)
Patient transported to CT 

## 2015-11-15 NOTE — ED Notes (Signed)
MD at bedside. 
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
## 2015-11-16 MED FILL — ?METFORMIN HCL 850 MG TABLE: 850 | 30 days supply | Qty: 30 | Fill #2

## 2015-11-22 ENCOUNTER — Emergency Department (HOSPITAL_COMMUNITY): Payer: No Typology Code available for payment source

## 2015-11-22 ENCOUNTER — Encounter (HOSPITAL_COMMUNITY): Payer: Self-pay | Admitting: *Deleted

## 2015-11-22 ENCOUNTER — Inpatient Hospital Stay (HOSPITAL_COMMUNITY)
Admission: EM | Admit: 2015-11-22 | Discharge: 2015-11-26 | DRG: 305 | Disposition: A | Discharge status: Home / Self Care | Payer: No Typology Code available for payment source | Attending: Cardiology | Admitting: Cardiology

## 2015-11-22 DIAGNOSIS — I5032 Chronic diastolic (congestive) heart failure: Secondary | ICD-10-CM | POA: Diagnosis present

## 2015-11-22 DIAGNOSIS — I209 Angina pectoris, unspecified: Secondary | ICD-10-CM

## 2015-11-22 DIAGNOSIS — R000 Tachycardia, unspecified: Secondary | ICD-10-CM | POA: Diagnosis not present

## 2015-11-22 DIAGNOSIS — R001 Bradycardia, unspecified: Secondary | ICD-10-CM | POA: Diagnosis present

## 2015-11-22 DIAGNOSIS — K219 Gastro-esophageal reflux disease without esophagitis: Secondary | ICD-10-CM | POA: Diagnosis present

## 2015-11-22 DIAGNOSIS — E785 Hyperlipidemia, unspecified: Secondary | ICD-10-CM | POA: Diagnosis present

## 2015-11-22 DIAGNOSIS — Z9119 Patient's noncompliance with other medical treatment and regimen: Secondary | ICD-10-CM

## 2015-11-22 DIAGNOSIS — M19041 Primary osteoarthritis, right hand: Secondary | ICD-10-CM | POA: Diagnosis present

## 2015-11-22 DIAGNOSIS — J449 Chronic obstructive pulmonary disease, unspecified: Secondary | ICD-10-CM | POA: Diagnosis present

## 2015-11-22 DIAGNOSIS — R7989 Other specified abnormal findings of blood chemistry: Secondary | ICD-10-CM

## 2015-11-22 DIAGNOSIS — M545 Low back pain: Secondary | ICD-10-CM | POA: Diagnosis present

## 2015-11-22 DIAGNOSIS — Z7984 Long term (current) use of oral hypoglycemic drugs: Secondary | ICD-10-CM

## 2015-11-22 DIAGNOSIS — R778 Other specified abnormalities of plasma proteins: Secondary | ICD-10-CM

## 2015-11-22 DIAGNOSIS — R079 Chest pain, unspecified: Secondary | ICD-10-CM | POA: Diagnosis present

## 2015-11-22 DIAGNOSIS — Z9841 Cataract extraction status, right eye: Secondary | ICD-10-CM

## 2015-11-22 DIAGNOSIS — M069 Rheumatoid arthritis, unspecified: Secondary | ICD-10-CM | POA: Diagnosis present

## 2015-11-22 DIAGNOSIS — M170 Bilateral primary osteoarthritis of knee: Secondary | ICD-10-CM | POA: Diagnosis present

## 2015-11-22 DIAGNOSIS — I160 Hypertensive urgency: Secondary | ICD-10-CM | POA: Diagnosis present

## 2015-11-22 DIAGNOSIS — F319 Bipolar disorder, unspecified: Secondary | ICD-10-CM | POA: Diagnosis present

## 2015-11-22 DIAGNOSIS — Z9114 Patient's other noncompliance with medication regimen: Secondary | ICD-10-CM

## 2015-11-22 DIAGNOSIS — G8929 Other chronic pain: Secondary | ICD-10-CM | POA: Diagnosis present

## 2015-11-22 DIAGNOSIS — Z7982 Long term (current) use of aspirin: Secondary | ICD-10-CM

## 2015-11-22 DIAGNOSIS — Z590 Homelessness: Secondary | ICD-10-CM

## 2015-11-22 DIAGNOSIS — I110 Hypertensive heart disease with heart failure: Secondary | ICD-10-CM | POA: Diagnosis present

## 2015-11-22 DIAGNOSIS — I248 Other forms of acute ischemic heart disease: Secondary | ICD-10-CM | POA: Diagnosis present

## 2015-11-22 DIAGNOSIS — F419 Anxiety disorder, unspecified: Secondary | ICD-10-CM | POA: Diagnosis present

## 2015-11-22 DIAGNOSIS — M109 Gout, unspecified: Secondary | ICD-10-CM | POA: Diagnosis present

## 2015-11-22 DIAGNOSIS — G40909 Epilepsy, unspecified, not intractable, without status epilepticus: Secondary | ICD-10-CM | POA: Diagnosis present

## 2015-11-22 DIAGNOSIS — R072 Precordial pain: Secondary | ICD-10-CM

## 2015-11-22 DIAGNOSIS — E119 Type 2 diabetes mellitus without complications: Secondary | ICD-10-CM | POA: Diagnosis present

## 2015-11-22 DIAGNOSIS — M19042 Primary osteoarthritis, left hand: Secondary | ICD-10-CM | POA: Diagnosis present

## 2015-11-22 DIAGNOSIS — I161 Hypertensive emergency: Principal | ICD-10-CM | POA: Diagnosis present

## 2015-11-22 DIAGNOSIS — Z6841 Body Mass Index (BMI) 40.0 and over, adult: Secondary | ICD-10-CM

## 2015-11-22 DIAGNOSIS — I10 Essential (primary) hypertension: Secondary | ICD-10-CM

## 2015-11-22 LAB — GLUCOSE, CAPILLARY
Glucose-Capillary: 82 mg/dL (ref 65–99)
Glucose-Capillary: 85 mg/dL (ref 65–99)

## 2015-11-22 LAB — BASIC METABOLIC PANEL
ANION GAP: 13 (ref 5–15)
BUN: 7 mg/dL (ref 6–20)
CO2: 19 mmol/L — ABNORMAL LOW (ref 22–32)
Calcium: 9.9 mg/dL (ref 8.9–10.3)
Chloride: 105 mmol/L (ref 101–111)
Creatinine, Ser: 0.86 mg/dL (ref 0.44–1.00)
Glucose, Bld: 130 mg/dL — ABNORMAL HIGH (ref 65–99)
POTASSIUM: 3.8 mmol/L (ref 3.5–5.1)
SODIUM: 137 mmol/L (ref 135–145)

## 2015-11-22 LAB — CBC
HEMATOCRIT: 40.4 % (ref 36.0–46.0)
Hemoglobin: 13.8 g/dL (ref 12.0–15.0)
MCH: 31.8 pg (ref 26.0–34.0)
MCHC: 34.2 g/dL (ref 30.0–36.0)
MCV: 93.1 fL (ref 78.0–100.0)
Platelets: 311 10*3/uL (ref 150–400)
RBC: 4.34 MIL/uL (ref 3.87–5.11)
RDW: 14.1 % (ref 11.5–15.5)
WBC: 7.1 10*3/uL (ref 4.0–10.5)

## 2015-11-22 LAB — HEPARIN LEVEL (UNFRACTIONATED): Heparin Unfractionated: 0.68 IU/mL (ref 0.30–0.70)

## 2015-11-22 LAB — TROPONIN I: Troponin I: 0.9 ng/mL (ref <0.03)

## 2015-11-22 LAB — I-STAT TROPONIN, ED: Troponin i, poc: 0.19 ng/mL (ref 0.00–0.08)

## 2015-11-22 MED ORDER — ACETAMINOPHEN 325 MG PO TABS
650.0000 mg | ORAL_TABLET | ORAL | Status: DC | PRN
  Administered 2015-11-22 – 2015-11-24 (×5): 650 mg via ORAL
  Filled 2015-11-22 (×5): qty 2

## 2015-11-22 MED ORDER — NITROGLYCERIN 0.4 MG SL SUBL: 0.4000 mg | SUBLINGUAL_TABLET | SUBLINGUAL | Status: DC | PRN

## 2015-11-22 MED ORDER — OXYBUTYNIN CHLORIDE 5 MG PO TABS
5.0000 mg | ORAL_TABLET | Freq: Two times a day (BID) | ORAL | Status: DC
  Administered 2015-11-22 – 2015-11-26 (×8): 5 mg via ORAL
  Filled 2015-11-22 (×8): qty 1

## 2015-11-22 MED ORDER — TRAZODONE HCL 50 MG PO TABS
25.0000 mg | ORAL_TABLET | Freq: Every day | ORAL | Status: DC | PRN
  Administered 2015-11-23 – 2015-11-25 (×2): 50 mg via ORAL
  Filled 2015-11-22 (×2): qty 1

## 2015-11-22 MED ORDER — PANTOPRAZOLE SODIUM 40 MG PO TBEC
40.0000 mg | DELAYED_RELEASE_TABLET | Freq: Every day | ORAL | Status: DC
  Administered 2015-11-22 – 2015-11-26 (×5): 40 mg via ORAL
  Filled 2015-11-22 (×5): qty 1

## 2015-11-22 MED ORDER — ONDANSETRON HCL 4 MG/2ML IJ SOLN
4.0000 mg | Freq: Four times a day (QID) | INTRAMUSCULAR | Status: DC | PRN
  Administered 2015-11-23 – 2015-11-24 (×3): 4 mg via INTRAVENOUS
  Filled 2015-11-22 (×3): qty 2

## 2015-11-22 MED ORDER — FUROSEMIDE 40 MG PO TABS
40.0000 mg | ORAL_TABLET | Freq: Every day | ORAL | Status: DC
  Administered 2015-11-22 – 2015-11-26 (×5): 40 mg via ORAL
  Filled 2015-11-22 (×5): qty 1

## 2015-11-22 MED ORDER — INSULIN ASPART 100 UNIT/ML ~~LOC~~ SOLN
0.0000 [IU] | Freq: Three times a day (TID) | SUBCUTANEOUS | Status: DC
  Administered 2015-11-25: 2 [IU] via SUBCUTANEOUS

## 2015-11-22 MED ORDER — DIVALPROEX SODIUM ER 500 MG PO TB24
750.0000 mg | ORAL_TABLET | Freq: Every day | ORAL | Status: DC
  Administered 2015-11-22 – 2015-11-25 (×4): 750 mg via ORAL
  Filled 2015-11-22 (×4): qty 1

## 2015-11-22 MED ORDER — ASPIRIN 81 MG PO CHEW
324.0000 mg | CHEWABLE_TABLET | Freq: Once | ORAL | Status: AC
  Administered 2015-11-22: 324 mg via ORAL
  Filled 2015-11-22: qty 4

## 2015-11-22 MED ORDER — LORATADINE 10 MG PO TABS
10.0000 mg | ORAL_TABLET | Freq: Every day | ORAL | Status: DC
  Administered 2015-11-22 – 2015-11-26 (×5): 10 mg via ORAL
  Filled 2015-11-22 (×5): qty 1

## 2015-11-22 MED ORDER — NITROGLYCERIN 0.4 MG SL SUBL
0.4000 mg | SUBLINGUAL_TABLET | SUBLINGUAL | Status: AC | PRN
  Administered 2015-11-22 (×3): 0.4 mg via SUBLINGUAL
  Filled 2015-11-22: qty 1

## 2015-11-22 MED ORDER — LISINOPRIL 20 MG PO TABS
20.0000 mg | ORAL_TABLET | Freq: Every day | ORAL | Status: DC
  Administered 2015-11-22 – 2015-11-23 (×2): 20 mg via ORAL
  Filled 2015-11-22 (×2): qty 1

## 2015-11-22 MED ORDER — NITROGLYCERIN IN D5W 200-5 MCG/ML-% IV SOLN
0.0000 ug/min | INTRAVENOUS | Status: DC
  Administered 2015-11-22: 5 ug/min via INTRAVENOUS
  Administered 2015-11-22: 10 ug/min via INTRAVENOUS
  Administered 2015-11-22: 15 ug/min via INTRAVENOUS
  Filled 2015-11-22: qty 250

## 2015-11-22 MED ORDER — HEPARIN (PORCINE) IN NACL 100-0.45 UNIT/ML-% IJ SOLN
1000.0000 [IU]/h | INTRAMUSCULAR | Status: DC
  Administered 2015-11-22: 1100 [IU]/h via INTRAVENOUS
  Filled 2015-11-22 (×2): qty 250

## 2015-11-22 MED ORDER — HEPARIN BOLUS VIA INFUSION
4000.0000 [IU] | Freq: Once | INTRAVENOUS | Status: AC
  Administered 2015-11-22: 4000 [IU] via INTRAVENOUS
  Filled 2015-11-22: qty 4000

## 2015-11-22 MED ORDER — ASPIRIN EC 81 MG PO TBEC
81.0000 mg | DELAYED_RELEASE_TABLET | Freq: Every day | ORAL | Status: DC
  Administered 2015-11-22 – 2015-11-26 (×5): 81 mg via ORAL
  Filled 2015-11-22 (×5): qty 1
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
## 2015-11-22 NOTE — Progress Notes (Addendum)
ANTICOAGULATION CONSULT NOTE -Follow up Pharmacy Consult for heparin Indication: chest pain/ACS  Allergies  Allergen Reactions  . Benzonatate Other (See Comments)    Made her cough worse  . Latex Other (See Comments)    Hands were scaly  . Penicillins Other (See Comments)    Unknown childhood allergic reaction - no other information available  . Lisinopril Cough  . Oxycodone-Acetaminophen Nausea And Vomiting    Pt thinks she may be able to take with food    Patient Measurements: Height: 5\' 4"  (162.6 cm) Weight: 256 lb 12.8 oz (116.5 kg) IBW/kg (Calculated) : 54.7 Heparin Dosing Weight: 84 kg  Vital Signs: Temp: 98.1 F (36.7 C) (08/24 1700) Temp Source: Oral (08/24 1700) BP: 142/75 (08/24 1729) Pulse Rate: 76 (08/24 1729)  Labs:  Recent Labs  11/22/15 0836 11/22/15 1832  HGB 13.8  --   HCT 40.4  --   PLT 311  --   HEPARINUNFRC  --  0.68  CREATININE 0.86  --     Estimated Creatinine Clearance: 94.8 mL/min (by C-G formula based on SCr of 0.86 mg/dL).  Assessment: 55 yo f presenting with intermittent CP x 1 day with SOB/Nausea.   PMH: bipolar, GERD, HTN, HLD, DM2, Hx of seizures  AC: none pta. Started on IV heparin drip for r/o ACS Initial 6 hour heparin level = 0.68, therapeutic level on 1100 units/hr IV heparin and after a 4000 units bolus.   Goal of Therapy:  Heparin level 0.3-0.7 units/ml Monitor platelets by anticoagulation protocol: Yes   Plan:  Continue IVHeparin at 1100 units/hr.  Recheck  HL in ~ 6 hr to confirm HL remains therapeutic, then Daily HL, CBC  Monitor for s/sx of bleeding F/U Cards plan  Nicole Cella, RPh Clinical Pharmacist Pager: [phone] 11/22/2015 7:05 PM

## 2015-11-22 NOTE — Progress Notes (Signed)
ANTICOAGULATION CONSULT NOTE - Initial Consult  Pharmacy Consult for heparin Indication: chest pain/ACS  Allergies  Allergen Reactions  . Benzonatate Other (See Comments)    Made her cough worse  . Latex Other (See Comments)    Hands were scaly  . Penicillins Other (See Comments)    Unknown childhood allergic reaction - no other information available  . Lisinopril Cough  . Oxycodone-Acetaminophen Nausea And Vomiting    Pt thinks she may be able to take with food    Patient Measurements: Height: 5\' 4"  (162.6 cm) Weight: 265 lb (120.2 kg) IBW/kg (Calculated) : 54.7 Heparin Dosing Weight: 84 kg  Vital Signs: Temp: 98.3 F (36.8 C) (08/24 0828) Temp Source: Oral (08/24 0828) BP: 186/90 (08/24 0828) Pulse Rate: 65 (08/24 0828)  Labs:  Recent Labs  11/22/15 0836  HGB 13.8  HCT 40.4  PLT 311  CREATININE 0.86    Estimated Creatinine Clearance: 96.6 mL/min (by C-G formula based on SCr of 0.86 mg/dL).  Assessment: 55 yo f presenting with intermittent CP x 1 day with SOB/Nausea  PMH: bipolar, GERD, HTN, HLD, DM2, Hx of seizures  AC: none pta for iv hep r/o ACS  Renal: SCr 0.86  Heme: H&H 13.8/40.4, Plt 311  Goal of Therapy:  Heparin level 0.3-0.7 units/ml Monitor platelets by anticoagulation protocol: Yes   Plan:  Heparin 4000 unit bolus Heparin 1100 units/hr infusion Initial HL 1600 Daily HL, CBC  Monitor for s/sx of bleeding F/U Cards plan  Levester Fresh, PharmD, BCPS, [facility] Clinical Pharmacist Pager [phone] 11/22/2015 10:01 AM

## 2015-11-22 NOTE — ED Notes (Signed)
Attempted report to 2W °
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
## 2015-11-22 NOTE — ED Notes (Signed)
2W states they are unable to accept the pt rt active CP.

## 2015-11-22 NOTE — Progress Notes (Signed)
ED CM noted CM consult for homeless CM entered referral for SW in EPIC to assist with homeless

## 2015-11-22 NOTE — Progress Notes (Signed)
CRITICAL VALUE ALERT  Critical value received:  Troponin 0.90  Date of notification:  11/22/2015  Time of notification:  1920  Critical value read back: yes  Nurse who received alert:  Ailene Ravel  MD notified (1st page):  [place]   Time of first page:  2035  MD notified (2nd page):  Time of second page:  Responding MD:    Time MD responded:

## 2015-11-22 NOTE — ED Triage Notes (Signed)
Pt states intermittent chest pain since yesterday accompanied by sob and  Nausea.

## 2015-11-22 NOTE — ED Notes (Signed)
Attempted to call report. Floor RN unable to accept report.  

## 2015-11-22 NOTE — ED Notes (Signed)
Called bed control and informed of situation.

## 2015-11-23 LAB — BASIC METABOLIC PANEL WITH GFR
Anion gap: 7 (ref 5–15)
BUN: 9 mg/dL (ref 6–20)
CO2: 27 mmol/L (ref 22–32)
Calcium: 9.2 mg/dL (ref 8.9–10.3)
Chloride: 104 mmol/L (ref 101–111)
Creatinine, Ser: 0.88 mg/dL (ref 0.44–1.00)
GFR calc Af Amer: >60 mL/min
GFR calc non Af Amer: >60 mL/min
Glucose, Bld: 107 mg/dL — ABNORMAL HIGH (ref 65–99)
Potassium: 3.8 mmol/L (ref 3.5–5.1)
Sodium: 138 mmol/L (ref 135–145)

## 2015-11-23 LAB — GLUCOSE, CAPILLARY
GLUCOSE-CAPILLARY: 91 mg/dL (ref 65–99)
GLUCOSE-CAPILLARY: 66 mg/dL (ref 65–99)
Glucose-Capillary: 95 mg/dL (ref 65–99)
Glucose-Capillary: 108 mg/dL — ABNORMAL HIGH (ref 65–99)

## 2015-11-23 LAB — CBC
HCT: 37.1 % (ref 36.0–46.0)
Hemoglobin: 12.3 g/dL (ref 12.0–15.0)
MCH: 31.5 pg (ref 26.0–34.0)
MCHC: 33.2 g/dL (ref 30.0–36.0)
MCV: 94.9 fL (ref 78.0–100.0)
Platelets: 357 K/uL (ref 150–400)
RBC: 3.91 MIL/uL (ref 3.87–5.11)
RDW: 14.2 % (ref 11.5–15.5)
WBC: 8.4 K/uL (ref 4.0–10.5)

## 2015-11-23 LAB — HEPARIN LEVEL (UNFRACTIONATED)
Heparin Unfractionated: 0.57 [IU]/mL (ref 0.30–0.70)
Heparin Unfractionated: 0.79 IU/mL — ABNORMAL HIGH (ref 0.30–0.70)

## 2015-11-23 LAB — TROPONIN I
TROPONIN I: 0.31 ng/mL — AB (ref <0.03)
TROPONIN I: 0.48 ng/mL — AB (ref <0.03)

## 2015-11-23 MED ORDER — LISINOPRIL 40 MG PO TABS
40.0000 mg | ORAL_TABLET | Freq: Every day | ORAL | Status: DC
  Filled 2015-11-23: qty 1

## 2015-11-23 MED ORDER — HYDRALAZINE HCL 25 MG PO TABS
25.0000 mg | ORAL_TABLET | Freq: Three times a day (TID) | ORAL | Status: DC
  Administered 2015-11-23 – 2015-11-26 (×9): 25 mg via ORAL
  Filled 2015-11-23 (×9): qty 1

## 2015-11-23 MED ORDER — LISINOPRIL 20 MG PO TABS: 20.0000 mg | ORAL_TABLET | Freq: Once | ORAL | Status: DC

## 2015-11-23 MED ORDER — LISINOPRIL 20 MG PO TABS
20.0000 mg | ORAL_TABLET | Freq: Every day | ORAL | Status: AC
  Administered 2015-11-23: 20 mg via ORAL
  Filled 2015-11-23: qty 1

## 2015-11-23 NOTE — Progress Notes (Signed)
ANTICOAGULATION CONSULT NOTE -Follow up Pharmacy Consult for heparin Indication: chest pain/ACS  Allergies  Allergen Reactions  . Benzonatate Other (See Comments)    Made her cough worse  . Latex Other (See Comments)    Hands were scaly  . Penicillins Other (See Comments)    Unknown childhood allergic reaction - no other information available  . Lisinopril Cough  . Oxycodone-Acetaminophen Nausea And Vomiting    Pt thinks she may be able to take with food    Patient Measurements: Height: 5\' 4"  (162.6 cm) Weight: 256 lb 12.8 oz (116.5 kg) IBW/kg (Calculated) : 54.7 Heparin Dosing Weight: 84 kg  Vital Signs: Temp: 98 F (36.7 C) (08/24 1939) Temp Source: Oral (08/24 1939) BP: 132/73 (08/24 1939) Pulse Rate: 59 (08/24 1939)  Labs:  Recent Labs  11/22/15 0836 11/22/15 1832 11/22/15 1833 11/22/15 2334  HGB 13.8  --   --   --   HCT 40.4  --   --   --   PLT 311  --   --   --   HEPARINUNFRC  --  0.68  --  0.79*  CREATININE 0.86  --   --   --   TROPONINI  --   --  0.90*  --     Estimated Creatinine Clearance: 94.8 mL/min (by C-G formula based on SCr of 0.86 mg/dL).  Assessment: 55 yo F on heparin for r/o ACS. Heparin level up to supratherapeutic on 1100 units/hr. No bleeding noted.  Goal of Therapy:  Heparin level 0.3-0.7 units/ml Monitor platelets by anticoagulation protocol: Yes   Plan:  Decrease IV Heparin to 1000 units/hr F/u heparin level in 6 hours  Sherlon Handing, PharmD, BCPS Clinical pharmacist, pager [phone] 11/23/2015 12:26 AM

## 2015-11-23 NOTE — Progress Notes (Signed)
Hospital Problem List     Principal Problem:   Hypertensive urgency Active Problems:   Chest pain   Elevated troponin    Patient Profile:   Primary Cardiologist: Dr. Johnsie Cancel  55 y.o. female with past medical history of Type 2 DM, HTN, HLD, Bipolar Disorder, and homelessness who presented to [facility] ED on 11/22/2015 for evaluation of chest pain. Found to be in hypertensive urgency with SBP in 190's.  Subjective   Having a bad headache this AM. Chest pain has improved, still describes a "tingling sensation" along her sternum, occurring at rest and lasting for 1-2 minutes at a time.   Inpatient Medications    . aspirin EC  81 mg Oral Daily  . divalproex  750 mg Oral QHS  . furosemide  40 mg Oral Daily  . insulin aspart  0-15 Units Subcutaneous TID WC  . lisinopril  20 mg Oral Daily  . loratadine  10 mg Oral Daily  . oxybutynin  5 mg Oral BID  . pantoprazole  40 mg Oral Daily   Vital Signs    Vitals:   11/22/15 1729 11/22/15 1939 11/23/15 0506 11/23/15 0957  BP: (!) 142/75 132/73 133/69 (!) 158/96  Pulse: 76 (!) 59 60   Resp: 13 14 (!) 22   Temp:  98 F (36.7 C) 97.5 F (36.4 C)   TempSrc:  Oral Oral   SpO2: 100% 99% 98%   Weight:   257 lb 12.8 oz (116.9 kg)   Height:        Intake/Output Summary (Last 24 hours) at 11/23/15 1108 Last data filed at 11/23/15 0507  Gross per 24 hour  Intake           721.47 ml  Output              800 ml  Net           -78.53 ml   Filed Weights   11/22/15 0828 11/22/15 1648 11/23/15 0506  Weight: 265 lb (120.2 kg) 256 lb 12.8 oz (116.5 kg) 257 lb 12.8 oz (116.9 kg)    Physical Exam    General: [name] appearing in no acute distress. Head: Normocephalic, atraumatic.  Neck: Supple without bruits, JVD not elevated. Lungs:  Resp regular and unlabored, CTA without wheezing or rales. Heart: RRR, S1, S2, no S3, S4, or murmur; no rub. Abdomen: Soft, non-tender, non-distended with normoactive bowel sounds.  No hepatomegaly. No rebound/guarding. No obvious abdominal masses. Extremities: No clubbing, cyanosis, or edema. Distal pedal pulses are 2+ bilaterally. Neuro: Alert and oriented X 3. Moves all extremities spontaneously. Psych: Normal affect.  Labs    CBC  Recent Labs  11/22/15 0836 11/23/15 0525  WBC 7.1 8.4  HGB 13.8 12.3  HCT 40.4 37.1  MCV 93.1 94.9  PLT 311 [id number]   Basic Metabolic Panel  Recent Labs  11/22/15 0836 11/23/15 0525  NA 137 138  K 3.8 3.8  CL 105 104  CO2 19* 27  GLUCOSE 130* 107*  BUN 7 9  CREATININE 0.86 0.88  CALCIUM 9.9 9.2   Liver Function Tests No results for input(s): AST, ALT, ALKPHOS, BILITOT, PROT, ALBUMIN in the last 72 hours. No results for input(s): LIPASE, AMYLASE in the last 72 hours. Cardiac Enzymes  Recent Labs  11/22/15 1833 11/22/15 2334 11/23/15 0525  TROPONINI 0.90* 0.48* 0.31*     Telemetry    Sinus bradycardia, HR in mid-40's.   ECG  Sinus bradycardia, HR 49, TWI in AVL, V1, and V2.   Cardiac Studies and Radiology    Dg Chest 2 View  Result Date: 11/22/2015 CLINICAL DATA:  Chest pain EXAM: CHEST  2 VIEW COMPARISON:  CT 11/15/2015.  11/14/2015. FINDINGS: Mediastinum hilar structures normal. Mild right base subsegmental atelectasis, lungs are otherwise clear. Heart size normal. No pleural effusion pneumothorax P IMPRESSION: Mild right base subsegmental atelectasis.  Otherwise negative chest. Electronically Signed   By: [name]  Register   On: 11/22/2015 09:11   Dg Chest 2 View  Result Date: 11/14/2015 CLINICAL DATA:  Chest pain and shortness breath. EXAM: CHEST  2 VIEW COMPARISON:  Two-view chest x-ray 04/20/2015 FINDINGS: The heart size and mediastinal contours are within normal limits. Both lungs are clear. The visualized skeletal structures are unremarkable. IMPRESSION: Negative two view chest x-ray Electronically Signed   By: San Morelle M.D.   On: 11/14/2015 21:58   Ct Angio Chest Pe W And/or Wo  Contrast  Result Date: 11/15/2015 CLINICAL DATA:  Chest pain, positive D-dimer EXAM: CT ANGIOGRAPHY CHEST WITH CONTRAST TECHNIQUE: Multidetector CT imaging of the chest was performed using the standard protocol during bolus administration of intravenous contrast. Multiplanar CT image reconstructions and MIPs were obtained to evaluate the vascular anatomy. CONTRAST:  100 cc Isovue COMPARISON:  None. FINDINGS: Cardiovascular: Heart size within normal limits. No pericardial effusion. The study is of excellent technical quality. No pulmonary embolus is noted. No aortic aneurysm. Mediastinum/Nodes: No mediastinal hematoma or adenopathy. No hilar adenopathy. Lungs/Pleura: Images of the lung parenchyma shows no acute infiltrate or pulmonary edema. No focal consolidation. Central airways are patent. Upper Abdomen: The visualized upper abdomen is unremarkable. Small amount of air is noted in distal esophagus. Musculoskeletal: No destructive bony lesions are noted. Sagittal images of the spine shows degenerative changes thoracic spine. Review of the MIP images confirms the above findings. IMPRESSION: 1. No pulmonary embolus is noted. 2. No acute infiltrate or pulmonary edema. 3. No mediastinal hematoma or adenopathy. 4. No aortic aneurysm. Electronically Signed   By: Lahoma Crocker M.D.   On: 11/15/2015 09:36    Assessment & Plan    1. Hypertensive urgency - presented with SBP in the 190's. Has not been compliant with medications prior to admission due to financial reasons and not having a secure place to store and keep up with her medications.  - PTA she was on Atenolol 100mg  daily, Clonidine 0.2mg  TID, Losartan 100mg  daily, and Spironolactone 50mg  daily.  - started on IV NTG drip along with Lisinopril 20mg  daily and Lasix 40mg  daily at time of admission, as these may be more financially reasonable for the patient when compared to her current regimen. Would avoid BB therapy secondary to bradycardia.  - Will start to  wean IV NTG drip to assist with headache. Will need additional BP pressure control. Consider addition of Amlodipine 5mg  daily.   2. Chest pain - reports having CP last week when she ruled out for MI while in the ED and CTA was negative for PE. CP represented yesterday when consuming cold water. Describes a pressure, present with rest and with exertion. Associated dyspnea.  - had cardiac catheterization in 10/2014 with normal cors. EKG does show new TWI in Leads I and AVL.  - started on Heparin while in the ED.   3. Elevated troponin - cyclic troponin values have been 0.19, 0.90, 0.48, and 0.31. Heparin started while in the ED. Can likely discontinue later today as she had normal  cors by cath less than 1 year ago. Trend likely secondary to demand ischemia in the setting of hypertensive urgency.   4. Type 2 DM - hold Metformin while admitted - SSI ordered.  5. Bradycardia - HR in mid-40's. Will stop Atenolol - continue to monitor on telemetry  6. Social Situation - currently homeless. Receives medications from [place] Work has been consulted.   Sabrina [name] , Sabrina [name] 11:08 AM 11/23/2015 Pager: [phone]  The patient was seen, examined and discussed with Bernerd Pho, Sabrina [name] and I agree with the above.    This is a very pleasant 56 year old female with prior medical history of hypertension, chronic diastolic CHF who has been previously evaluated for chest pain and underwent a cardiac catheterization in August 2016 that showed no evidence of coronary artery disease. Patient again came to the hospital a week ago for chest pain and ACS was ruled out, chest CT was negative for pulmonary embolism.. She is in a very unfortunate situation, being, was living in a car having 2 jobs. She was able to get her medications through [facility] but she is not able to take them all the time. She presented today with retrosternal chest  pain at rest, when she presented to [facility] her blood pressure was up to 194/93. Her blood work shows normal CBC normal BNP but elevated troponin of 0.19. Her EKG shows new negative T waves in lead 1 and aVL. She was started on heparin drip by your physician. We will recheck her troponin and a flat train or decreasing we'll discontinue heparin.  This is most probably hypertensive emergency, she is insisting on leaving as she has to go to work tomorrow. We will give additional lisinopril 20 mg po now, wean off NTG drip and discharge home on lisinopril 40 mg po daily and lasix 40 mg po daily and follow up in our clinic.  Sabrina Dawley, MD 11/23/2015

## 2015-11-23 NOTE — Progress Notes (Signed)
  Patient's NTG drip was stopped. Since then, her SBP has continued to trend upwards and was 172/93 at 1446. She was given an additional Lisinopril 20mg  but BP remains elevated at 191/98.  She initially reported needing to leave the hospital tonight for her work tomorrow, but has touched base with her boss whom is aware she is in the hospital. Patient made aware that with her SBP in the 190's, this would not be an optimal time for discharge as she reports having nausea and a headache as well.  Will start PO Hydralazine 25mg  Q8H, giving first dose now, and recheck BP in one hour. Patient is in agreement for being continually monitored overnight. Will make evening call Cardiology APP aware in case these plans change.  Signed, Erma Heritage, PA-C 11/23/2015, 4:35 PM

## 2015-11-23 NOTE — Clinical Social Work Note (Signed)
Clinical Social Work Assessment  Patient Details  Name: Sabrina [name] MRN: [id number] Date of Birth: May 26, 1960  Date of referral:  11/23/15               Reason for consult:  Housing Concerns/Homelessness                Permission sought to share information with:    Permission granted to share information::  No  Name::        Agency::     Relationship::     Contact Information:     Housing/Transportation Living arrangements for the past 2 months:  Apartment Source of Information:  Patient, Medical Team Patient Interpreter Needed:  None Criminal Activity/Legal Involvement Pertinent to Current Situation/Hospitalization:  No - Comment as needed Significant Relationships:  Significant Other Lives with:  Significant Other Do you feel safe going back to the place where you live?  Yes Need for family participation in patient care:  Yes (Comment)  Care giving concerns:  Homelessness   Social Worker assessment / plan:  CSW met with patient. No supports at bedside. CSW introduced role and inquired about living situation. Patient has been living in her boyfriend's apartment but has concerns about inability to pay for furniture. They have been sleeping on an air mattress but it deflates in the middle of the night. The stove does not work properly. CSW will locate furniture assistance resources in the area and provide to patient. Once this is done, CSW will sign off. No further concerns.  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 Patient's significant other supportive and involved in patient's care. Patient appreciated social work intervention.   Patient/Family's Understanding of and Emotional  Response to Diagnosis, Current Treatment, and Prognosis:  Patient understands need for resource consult. Patient appears happy with social work intervention.  Emotional Assessment Appearance:  Appears stated age Attitude/Demeanor/Rapport:  Other (Pleasant) Affect (typically observed):  Accepting, Appropriate, Calm, Pleasant, Quiet, Other (Sick) Orientation:  Oriented to Self, Oriented to Place, Oriented to  Time, Oriented to Situation Alcohol / Substance use:  Never Used Psych involvement (Current and /or in the community):  No (Comment)  Discharge Needs  Concerns to be addressed:  Homelessness Readmission within the last 30 days:  No Current discharge risk:  None Barriers to Discharge:  No Barriers Identified   Candie Chroman, LCSW 11/23/2015, 3:14 PM

## 2015-11-23 NOTE — Progress Notes (Signed)
ANTICOAGULATION CONSULT NOTE -Follow up Pharmacy Consult for heparin Indication: chest pain/ACS  Allergies  Allergen Reactions  . Benzonatate Other (See Comments)    Made her cough worse  . Latex Other (See Comments)    Hands were scaly  . Penicillins Other (See Comments)    Unknown childhood allergic reaction - no other information available  . Lisinopril Cough  . Oxycodone-Acetaminophen Nausea And Vomiting    Pt thinks she may be able to take with food    Patient Measurements: Height: 5\' 4"  (162.6 cm) Weight: 257 lb 12.8 oz (116.9 kg) IBW/kg (Calculated) : 54.7 Heparin Dosing Weight: 84 kg  Vital Signs: Temp: 97.5 F (36.4 C) (08/25 0506) Temp Source: Oral (08/25 0506) BP: 158/96 (08/25 0957) Pulse Rate: 60 (08/25 0506)  Labs:  Recent Labs  11/22/15 0836 11/22/15 1832 11/22/15 1833 11/22/15 2334 11/23/15 0525 11/23/15 0912  HGB 13.8  --   --   --  12.3  --   HCT 40.4  --   --   --  37.1  --   PLT 311  --   --   --  357  --   HEPARINUNFRC  --  0.68  --  0.79*  --  0.57  CREATININE 0.86  --   --   --  0.88  --   TROPONINI  --   --  0.90* 0.48* 0.31*  --     Estimated Creatinine Clearance: 92.9 mL/min (by C-G formula based on SCr of 0.88 mg/dL).  Assessment: 55 yo F on heparin for r/o ACS. Heparin level now therapeutic (0.57) after rate decrease to 1000 units/hr. No AC pta. CBC wnl, No bleeding noted.  Goal of Therapy:  Heparin level 0.3-0.7 units/ml Monitor platelets by anticoagulation protocol: Yes   Plan:  Heparin at 1000 units/hr 6h HL to confirm, Daily HL, CBC  Monitor for s/sx of bleeding F/U Cards plan  Elicia Lamp, PharmD, [facility] Clinical Pharmacist Pager [phone] 11/23/2015 10:05 AM

## 2015-11-24 LAB — GLUCOSE, CAPILLARY
GLUCOSE-CAPILLARY: 119 mg/dL — AB (ref 65–99)
Glucose-Capillary: 116 mg/dL — ABNORMAL HIGH (ref 65–99)
Glucose-Capillary: 120 mg/dL — ABNORMAL HIGH (ref 65–99)
Glucose-Capillary: 102 mg/dL — ABNORMAL HIGH (ref 65–99)

## 2015-11-24 MED ORDER — ATENOLOL 25 MG PO TABS
100.0000 mg | ORAL_TABLET | Freq: Every day | ORAL | Status: DC
  Administered 2015-11-24 – 2015-11-26 (×3): 100 mg via ORAL
  Filled 2015-11-24 (×3): qty 4

## 2015-11-24 MED ORDER — CLONIDINE HCL 0.2 MG PO TABS: 0.2000 mg | ORAL_TABLET | Freq: Three times a day (TID) | ORAL | Status: DC

## 2015-11-24 MED ORDER — SPIRONOLACTONE 25 MG PO TABS
50.0000 mg | ORAL_TABLET | Freq: Every day | ORAL | Status: DC
  Administered 2015-11-24 – 2015-11-26 (×3): 50 mg via ORAL
  Filled 2015-11-24 (×3): qty 2

## 2015-11-24 MED ORDER — ALUM & MAG HYDROXIDE-SIMETH 200-200-20 MG/5ML PO SUSP
30.0000 mL | ORAL | Status: DC | PRN
  Administered 2015-11-24: 30 mL via ORAL
  Filled 2015-11-24: qty 30

## 2015-11-24 MED ORDER — CLONIDINE HCL 0.2 MG PO TABS
0.2000 mg | ORAL_TABLET | Freq: Two times a day (BID) | ORAL | Status: DC
  Administered 2015-11-24 – 2015-11-26 (×5): 0.2 mg via ORAL
  Filled 2015-11-24 (×5): qty 1

## 2015-11-24 NOTE — Progress Notes (Signed)
Cardiology up on unit and came to check on patient and ordered her NTG drip to be restarted to keep her B/P down and I could give her Zofran IV a little early, will continue to monitor.

## 2015-11-24 NOTE — Progress Notes (Signed)
Subjective:  Blood pressure still elevated today.  She is complaining of a cough today and had been started on lisinopril which has been associated with cough in the past.  She is off a number of her medications that she takes at home.  She is currently tachycardic today.  Objective:  Vital Signs in the last 24 hours: BP (!) 167/67   Pulse 98   Temp 97.8 F (36.6 C) (Oral)   Resp 16   Ht 5\' 4"  (1.626 m)   Wt 116.3 kg (256 lb 6.4 oz)   SpO2 100%   BMI 44.01 kg/m   Physical Exam: Severely obese black female in no acute distress Lungs:  Clear Cardiac:  Regular rhythm, normal S1 and S2, no S3 Extremities:  No edema present  Intake/Output from previous day: 08/25 0701 - 08/26 0700 In: 328.3 [P.O.:300; I.V.:28.3] Out: 800 [Urine:800]  Weight Filed Weights   11/22/15 1648 11/23/15 0506 11/24/15 0356  Weight: 116.5 kg (256 lb 12.8 oz) 116.9 kg (257 lb 12.8 oz) 116.3 kg (256 lb 6.4 oz)    Lab Results: Basic Metabolic Panel:  Recent Labs  11/22/15 0836 11/23/15 0525  NA 137 138  K 3.8 3.8  CL 105 104  CO2 19* 27  GLUCOSE 130* 107*  BUN 7 9  CREATININE 0.86 0.88   CBC:  Recent Labs  11/22/15 0836 11/23/15 0525  WBC 7.1 8.4  HGB 13.8 12.3  HCT 40.4 37.1  MCV 93.1 94.9  PLT 311 357   Cardiac Enzymes: Troponin (Point of Care Test)  Recent Labs  11/22/15 0903  TROPIPOC 0.19*   Cardiac Panel (last 3 results)  Recent Labs  11/22/15 1833 11/22/15 2334 11/23/15 0525  TROPONINI 0.90* 0.48* 0.31*    Telemetry: Sinus tachycardia  Assessment/Plan:  1.  Hypertensive urgency still out of control 2.  Morbid obesity 3.  Poor social situation with homelessness and noncompliance poor medical insight  Recommendations:  She needs to be back on her medicines.  I'm going to restart atenolol, restart clonidine to avoid rebound that 0.2 mg twice daily, add back spironolactone and stop the lisinopril which is likely causing her cough.     Kerry Hough  MD [facility] Cardiology  11/24/2015, 9:14 AM

## 2015-11-24 NOTE — Progress Notes (Signed)
Report received in patient's room via [facility], using SBAR format, reviewed vs, labs, tests, PMH, code status, allergies and patient's general condition, assumed care of patient.

## 2015-11-24 NOTE — Progress Notes (Signed)
Text paged Cardiology re: elevated B/P, NTG and Heparin drips have been D/C'd but patient's B/P has been extremely elevated and she is C/O a very bad Headache and nausea. Patient is not due to have Zofran at this time, will await further orders.

## 2015-11-25 LAB — GLUCOSE, CAPILLARY
Glucose-Capillary: 94 mg/dL (ref 65–99)
Glucose-Capillary: 97 mg/dL (ref 65–99)
Glucose-Capillary: 138 mg/dL — ABNORMAL HIGH (ref 65–99)
Glucose-Capillary: 113 mg/dL — ABNORMAL HIGH (ref 65–99)

## 2015-11-25 MED ORDER — MAGNESIUM HYDROXIDE 400 MG/5ML PO SUSP: 30.0000 mL | Freq: Every day | ORAL | Status: DC | PRN

## 2015-11-25 NOTE — Progress Notes (Signed)
Report received in patient's room via [place] RN using [employer], updated on VS, meds and events of the day, assumed care of patient.

## 2015-11-25 NOTE — Progress Notes (Signed)
Subjective:  She is actively coughing today.  She had been started on lisinopril yesterday and this may represent a residual effect of that.  I stopped the lisinopril yesterday.  Rambling historian about homelessness, poor social situation and her cough.  Objective:  Vital Signs in the last 24 hours: BP (!) 142/94   Pulse 78   Temp 98.1 F (36.7 C) (Oral)   Resp 18   Ht 5\' 4"  (1.626 m)   Wt 116.7 kg (257 lb 4.8 oz)   SpO2 99%   BMI 44.17 kg/m   Physical Exam: Severely obese black female in no acute distress but actively coughing. Lungs:  Clear Cardiac:  Regular rhythm, normal S1 and S2, no S3 Extremities:  No edema present  Intake/Output from previous day: 08/26 0701 - 08/27 0700 In: 1560 [P.O.:1560] Out: 2700 [Urine:2700]  Weight Filed Weights   11/23/15 0506 11/24/15 0356 11/25/15 0353  Weight: 116.9 kg (257 lb 12.8 oz) 116.3 kg (256 lb 6.4 oz) 116.7 kg (257 lb 4.8 oz)    Lab Results: Basic Metabolic Panel:  Recent Labs  11/23/15 0525  NA 138  K 3.8  CL 104  CO2 27  GLUCOSE 107*  BUN 9  CREATININE 0.88   CBC:  Recent Labs  11/23/15 0525  WBC 8.4  HGB 12.3  HCT 37.1  MCV 94.9  PLT 357   Cardiac Enzymes: Cardiac Panel (last 3 results)  Recent Labs  11/22/15 1833 11/22/15 2334 11/23/15 0525  TROPONINI 0.90* 0.48* 0.31*    Telemetry: Sinus tachycardia  Assessment/Plan:  1.  Hypertension coming under better control 2.  Morbid obesity 3.  Poor social situation with homelessness and noncompliance poor medical insight 4.  Cough possibly residual effects of lisinopril 5.  Chronic chest pain 6.  Troponin elevation likely due to hypertensive urgency  Recommendations:  Her medicines have been restarted and blood pressure is better.  She is actively coughing today and continuing complaint of chest pain.  I'm not sure that she is stable enough to discharge today.  I think that she'll need to have additional either social or psychiatric work done  prior to going home.     Kerry Hough  MD [facility] Cardiology  11/25/2015, 9:34 AM

## 2015-11-26 LAB — GLUCOSE, CAPILLARY
Glucose-Capillary: 115 mg/dL — ABNORMAL HIGH (ref 65–99)
Glucose-Capillary: 108 mg/dL — ABNORMAL HIGH (ref 65–99)

## 2015-11-26 MED ORDER — MENTHOL 3 MG MT LOZG
1.0000 | LOZENGE | OROMUCOSAL | Status: DC | PRN
  Filled 2015-11-26: qty 9

## 2015-11-26 MED ORDER — SPIRONOLACTONE 50 MG PO TABS: 50.0000 mg | ORAL_TABLET | Freq: Every day | ORAL | 3 refills | Status: DC

## 2015-11-26 MED ORDER — FUROSEMIDE 40 MG PO TABS: 40.0000 mg | ORAL_TABLET | Freq: Every day | ORAL | 3 refills | Status: DC

## 2015-11-26 MED ORDER — CLONIDINE HCL 0.2 MG PO TABS: 0.2000 mg | ORAL_TABLET | Freq: Three times a day (TID) | ORAL | 5 refills | Status: DC

## 2015-11-26 MED ORDER — ATENOLOL 100 MG PO TABS: 100.0000 mg | ORAL_TABLET | Freq: Every day | ORAL | 3 refills | Status: DC

## 2015-11-26 MED ORDER — MENTHOL 3 MG MT LOZG: 1.0000 | LOZENGE | OROMUCOSAL | 12 refills | Status: DC | PRN

## 2015-11-26 MED ORDER — HYDRALAZINE HCL 25 MG PO TABS: 25.0000 mg | ORAL_TABLET | Freq: Three times a day (TID) | ORAL | 6 refills | Status: DC

## 2015-11-26 NOTE — Progress Notes (Signed)
Patient Name: Sabrina [name] Date of Encounter: 11/26/2015   Patient Profile:   Primary Cardiologist: Dr. Johnsie Cancel  55 y.o.femalewith past medical history of Type 2 DM, HTN, HLD, Bipolar Disorder, and homelessness who presented to [facility] ED on 11/22/2015 for evaluation of chest pain. Found to be in hypertensive urgency with SBP in 190's.   SUBJECTIVE  Still having dry cough despite off lisinopril. Complains of sore throat. No chest pain or shortness of breath.  CURRENT MEDS . aspirin EC  81 mg Oral Daily  . atenolol  100 mg Oral Daily  . cloNIDine  0.2 mg Oral BID  . divalproex  750 mg Oral QHS  . furosemide  40 mg Oral Daily  . hydrALAZINE  25 mg Oral Q8H  . insulin aspart  0-15 Units Subcutaneous TID WC  . loratadine  10 mg Oral Daily  . oxybutynin  5 mg Oral BID  . pantoprazole  40 mg Oral Daily  . spironolactone  50 mg Oral Daily    OBJECTIVE  Vitals:   11/25/15 1534 11/25/15 2057 11/26/15 0020 11/26/15 0532  BP: 115/73 138/78 105/60 134/79  Pulse: 68 (!) 58 (!) 51 60  Resp: 16 12 18 16   Temp: 97.6 F (36.4 C) 98.2 F (36.8 C) 98 F (36.7 C) 98.1 F (36.7 C)  TempSrc: Oral Oral Oral Oral  SpO2: 99% 96% 96% 97%  Weight:    256 lb 14.4 oz (116.5 kg)  Height:        Intake/Output Summary (Last 24 hours) at 11/26/15 0828 Last data filed at 11/26/15 0600  Gross per 24 hour  Intake             1440 ml  Output                0 ml  Net             1440 ml   Filed Weights   11/24/15 0356 11/25/15 0353 11/26/15 0532  Weight: 256 lb 6.4 oz (116.3 kg) 257 lb 4.8 oz (116.7 kg) 256 lb 14.4 oz (116.5 kg)    PHYSICAL EXAM  General: Pleasant, Obese female NAD. Neuro: Alert and oriented X 3. Moves all extremities spontaneously. Psych: Normal affect. HEENT:  Normal  Neck: Supple without bruits or JVD. Lungs:  Resp regular and unlabored, CTA. Heart: RRR no s3, s4, or murmurs. Abdomen: Soft, non-tender, non-distended, BS + x 4.  Extremities: No clubbing,  cyanosis or edema. DP/PT/Radials 2+ and equal bilaterally.  Accessory Clinical Findings   TELE  Sinus rhythm  Radiology/Studies  Dg Chest 2 View  Result Date: 11/22/2015 CLINICAL DATA:  Chest pain EXAM: CHEST  2 VIEW COMPARISON:  CT 11/15/2015.  11/14/2015. FINDINGS: Mediastinum hilar structures normal. Mild right base subsegmental atelectasis, lungs are otherwise clear. Heart size normal. No pleural effusion pneumothorax P IMPRESSION: Mild right base subsegmental atelectasis.  Otherwise negative chest. Electronically Signed   By: [name]  Register   On: 11/22/2015 09:11   Dg Chest 2 View  Result Date: 11/14/2015 CLINICAL DATA:  Chest pain and shortness breath. EXAM: CHEST  2 VIEW COMPARISON:  Two-view chest x-ray 04/20/2015 FINDINGS: The heart size and mediastinal contours are within normal limits. Both lungs are clear. The visualized skeletal structures are unremarkable. IMPRESSION: Negative two view chest x-ray Electronically Signed   By: San Morelle M.D.   On: 11/14/2015 21:58   Ct Angio Chest Pe W And/or Wo Contrast  Result Date: 11/15/2015 CLINICAL DATA:  Chest pain, positive D-dimer EXAM: CT ANGIOGRAPHY CHEST WITH CONTRAST TECHNIQUE: Multidetector CT imaging of the chest was performed using the standard protocol during bolus administration of intravenous contrast. Multiplanar CT image reconstructions and MIPs were obtained to evaluate the vascular anatomy. CONTRAST:  100 cc Isovue COMPARISON:  None. FINDINGS: Cardiovascular: Heart size within normal limits. No pericardial effusion. The study is of excellent technical quality. No pulmonary embolus is noted. No aortic aneurysm. Mediastinum/Nodes: No mediastinal hematoma or adenopathy. No hilar adenopathy. Lungs/Pleura: Images of the lung parenchyma shows no acute infiltrate or pulmonary edema. No focal consolidation. Central airways are patent. Upper Abdomen: The visualized upper abdomen is unremarkable. Small amount of air is noted  in distal esophagus. Musculoskeletal: No destructive bony lesions are noted. Sagittal images of the spine shows degenerative changes thoracic spine. Review of the MIP images confirms the above findings. IMPRESSION: 1. No pulmonary embolus is noted. 2. No acute infiltrate or pulmonary edema. 3. No mediastinal hematoma or adenopathy. 4. No aortic aneurysm. Electronically Signed   By: Lahoma Crocker M.D.   On: 11/15/2015 09:36    ASSESSMENT AND PLAN  1.  Hypertensive urgency - BP has been improved. Continue current medications. D/C lisinopril due to cough--> likely still having residual effect.   2. Chest pain, chronic - currently chest pain free    3. Elevated troponin  - Demand in setting of hypertensive urgency  Dispo: Discharged later today pending social worker evaluation. Case manager to help with discharge need and follow-up in our community health clinic for post hospital appointment. Likely outpatient psychiatric evaluation as well.  Jarrett Soho PA-C Pager [phone] Patient seen and examined. I agree with the assessment and plan as detailed above. See also my additional thoughts below.   The patient is stable and ready to be discharged today. We appreciate the help from social work.  Dola Argyle, MD, [facility] 11/26/2015 10:34 AM

## 2015-11-26 NOTE — Discharge Instructions (Signed)
Hypertension Hypertension is another name for high blood pressure. High blood pressure forces your heart to work harder to pump blood. A blood pressure reading has two numbers, which includes a higher number over a lower number (example: 110/72). HOME CARE   Have your blood pressure rechecked by your doctor.  Only take medicine as told by your doctor. Follow the directions carefully. The medicine does not work as well if you skip doses. Skipping doses also puts you at risk for problems.  Do not smoke.  Monitor your blood pressure at home as told by your doctor. GET HELP IF:  You think you are having a reaction to the medicine you are taking.  You have repeat headaches or feel dizzy.  You have puffiness (swelling) in your ankles.  You have trouble with your vision. GET HELP RIGHT AWAY IF:   You get a very bad headache and are confused.  You feel weak, numb, or faint.  You get chest or belly (abdominal) pain.  You throw up (vomit).  You cannot breathe very well. MAKE SURE YOU:   Understand these instructions.  Will watch your condition.  Will get help right away if you are not doing well or get worse.   This information is not intended to replace advice given to you by your health care provider. Make sure you discuss any questions you have with your health care provider.   Document Released: 09/03/2007 Document Revised: 03/22/2013 Document Reviewed: 01/07/2013 Elsevier Interactive Patient Education 2016 Elsevier Inc.   Nonspecific Chest Pain It is often hard to find the cause of chest pain. There is always a chance that your pain could be related to something serious, such as a heart attack or a blood clot in your lungs. Chest pain can also be caused by conditions that are not life-threatening. If you have chest pain, it is very important to follow up with your doctor.  HOME CARE  If you were prescribed an antibiotic medicine, finish it all even if you start to feel  better.  Avoid any activities that cause chest pain.  Do not use any tobacco products, including cigarettes, chewing tobacco, or electronic cigarettes. If you need help quitting, ask your doctor.  Do not drink alcohol.  Take medicines only as told by your doctor.  Keep all follow-up visits as told by your doctor. This is important. This includes any further testing if your chest pain does not go away.  Your doctor may tell you to keep your head raised (elevated) while you sleep.  Make lifestyle changes as told by your doctor. These may include:  Getting regular exercise. Ask your doctor to suggest some activities that are safe for you.  Eating a heart-healthy diet. Your doctor or a diet specialist (dietitian) can help you to learn healthy eating options.  Maintaining a healthy weight.  Managing diabetes, if necessary.  Reducing stress. GET HELP IF:  Your chest pain does not go away, even after treatment.  You have a rash with blisters on your chest.  You have a fever. GET HELP RIGHT AWAY IF:  Your chest pain is worse.  You have an increasing cough, or you cough up blood.  You have severe belly (abdominal) pain.  You feel extremely weak.  You pass out (faint).  You have chills.  You have sudden, unexplained chest discomfort.  You have sudden, unexplained discomfort in your arms, back, neck, or jaw.  You have shortness of breath at any time.  You suddenly  start to sweat, or your skin gets clammy.  You feel nauseous.  You vomit.  You suddenly feel light-headed or dizzy.  Your heart begins to beat quickly, or it feels like it is skipping beats. These symptoms may be an emergency. Do not wait to see if the symptoms will go away. Get medical help right away. Call your local emergency services (911 in the [place] Do not drive yourself to the hospital.   This information is not intended to replace advice given to you by your health care provider. Make sure you  discuss any questions you have with your health care provider.   Document Released: 09/03/2007 Document Revised: 04/07/2014 Document Reviewed: 10/21/2013 Elsevier Interactive Patient Education [employer].

## 2015-11-26 NOTE — Clinical Social Work Note (Signed)
Spoke with Emelda Fear from [employer]. Sabrina [name] has approved referral to be made to [employer]. Paperwork completed by CSW and faxed over. Copy of referral given to the patient. 2 bus passes given to the patient. CSW signing off.  Liz Beach MSW, [place], [place], JI:[id number]

## 2015-11-26 NOTE — Clinical Social Work Note (Signed)
Charge RN informed CSW that patient is requesting assistance with furniture. CSW met with the patient to provide resource information to [employer]. Patient is aware of this resource but states that an actual referral has to be made. Voicemail left with agency providing patient's information and need.    Liz Beach MSW, [place], [place], [phone]

## 2015-11-26 NOTE — Clinical Social Work Note (Signed)
CSW spoke with [facility] network regarding referral. They state that one of [facility] authorized employees will have to make the referral. Message left with Emelda Fear to request referral be made for the patient. CSW signing off at this time.   Liz Beach MSW, [place], [place], JI:[id number]

## 2015-11-26 NOTE — Discharge Summary (Signed)
Discharge Summary    Patient ID: Sabrina [name],  MRN: [id number], DOB/AGE: 1961/01/06 55 y.o.  Admit date: 11/22/2015 Discharge date: 11/26/2015  Primary Care Provider: Minerva Ends Primary Cardiologist: Dr. Johnsie Cancel  Discharge Diagnoses    Principal Problem:   Hypertensive urgency Active Problems:   Chest pain    Elevated troponin   Allergies Allergies  Allergen Reactions  . Benzonatate Other (See Comments)    Made her cough worse  . Latex Other (See Comments)    Hands were scaly  . Penicillins Other (See Comments)    Unknown childhood allergic reaction - no other information available  . Lisinopril Cough  . Oxycodone-Acetaminophen Nausea And Vomiting    Pt thinks she may be able to take with food    Diagnostic Studies/Procedures  None   History of Present Illness     Sabrina [name] is a 55 y.o. female with past medical history of Type 2 DM, HTN, HLD, Bipolar Disorder, and homelessness who presented to [facility] ED on 11/22/2015 for evaluation of chest pain.   Her initial ischemic evaluation was a NST in 08/2014 which showed a reversible medium defect of moderate severity present in the basal anterior, mid anterior and apex location. She later underwent a cardiac catheterization in 10/2014 which showed no angiographic evidence of CAD.  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 Said she had episodes of chest pain, mostly associated with coughing. EKG showed no acute ischemic changes.   Was seen in the ED 11/15/15 for chest pain. EKG was without acute changes and troponin value was negative. D-dimer was elevated to 0.60 and a CTA was ordered which showed no evidence of a PE.   She presents to ER 8/24 with recurrent chest discomfort. Says she developed an initial chest pain yesterday which occurred after consuming cold water. The pain represented this morning and she  describes it as a pressure located along her sternum. Associated with dyspnea. Denies any nausea, vomiting, or diaphoresis. Pain has been constant since onset but improved with SL NTG and ASA. No association with exertion.   While in the ED, labs have shown a WBC of 7.1, Hgb 13.8, platelets 311. Na+ 137, K+ 3.8, glucose 130, creatinine 0.86. Initial troponin elevated to 0.19. CXR shows mild right base subsegmental atelectasis. EKG shows NSR, HR 52, new TWI in I and AVL, resolved TWI in V1.   BP has been elevated up to 190/93  while in the ED and HR has been in mid-40's. SShe is in a very unfortunate situation, being, was living in a car having 2 jobs. She was able to get her medications through [facility] but she is not able to take them all the time.  Hospital Course     Consultants: social worker   The patient was admitted for hypertensive urgency and started on IV nitroglycerin, lisinopril 20 mg and furosemide 40 mg daily. Avoided beta blocker secondary to bradycardia. IV NTG drip weaned off to assist with headache. Her EKG shows new negative T waves in lead 1 and aVL.  Cyclic troponin values have been 0.19, 0.90, 0.48, and 0.31. Heparin started while in the ED. She had a normal coronary by cath less than year ago. Elevated troponin and chest pain felt likely demand secondary in setting of hypertensive urgency. Discontinued heparin. Over the weekend patient was followed by Dr. Wynonia Lawman who  felt that She was off a number of her medications that she takes at home. He restarted atenolol due to tachycardia, restarted clonidine to avoid rebound that 0.2 mg twice daily, added  back spironolactone and stoedp the lisinopril which is likely causing her cough (still having residual effect). Hydralazine added to better control her BP. With above changes her blood pressure improved. She was seen by Education officer, [facility] for home health needed. She will follow-up in [place]. Will need  BMET check during outpatient visit.   The patient has been seen by Dr. Ron Parker today and deemed ready for discharge home. All follow-up appointments have been scheduled. Discharge medications are listed below.    Discharge Vitals Blood pressure 104/69, pulse 71, temperature 98.3 F (36.8 C), temperature source Oral, resp. rate 18, height 5\' 4"  (1.626 m), weight 256 lb 14.4 oz (116.5 kg), SpO2 100 %.  Filed Weights   11/24/15 0356 11/25/15 0353 11/26/15 0532  Weight: 256 lb 6.4 oz (116.3 kg) 257 lb 4.8 oz (116.7 kg) 256 lb 14.4 oz (116.5 kg)    Labs & Radiologic Studies     CBC No results for input(s): WBC, NEUTROABS, HGB, HCT, MCV, PLT in the last 72 hours. Basic Metabolic Panel No results for input(s): NA, K, CL, CO2, GLUCOSE, BUN, CREATININE, CALCIUM, MG, PHOS in the last 72 hours. Liver Function Tests No results for input(s): AST, ALT, ALKPHOS, BILITOT, PROT, ALBUMIN in the last 72 hours. No results for input(s): LIPASE, AMYLASE in the last 72 hours. Cardiac Enzymes No results for input(s): CKTOTAL, CKMB, CKMBINDEX, TROPONINI in the last 72 hours. BNP Invalid input(s): POCBNP D-Dimer No results for input(s): DDIMER in the last 72 hours. Hemoglobin A1C No results for input(s): HGBA1C in the last 72 hours. Fasting Lipid Panel No results for input(s): CHOL, HDL, LDLCALC, TRIG, CHOLHDL, LDLDIRECT in the last 72 hours. Thyroid Function Tests No results for input(s): TSH, T4TOTAL, T3FREE, THYROIDAB in the last 72 hours.  Invalid input(s): FREET3  Dg Chest 2 View  Result Date: 11/22/2015 CLINICAL DATA:  Chest pain EXAM: CHEST  2 VIEW COMPARISON:  CT 11/15/2015.  11/14/2015. FINDINGS: Mediastinum hilar structures normal. Mild right base subsegmental atelectasis, lungs are otherwise clear. Heart size normal. No pleural effusion pneumothorax P IMPRESSION: Mild right base subsegmental atelectasis.  Otherwise negative chest. Electronically Signed   By: [name]  Register   On: 11/22/2015  09:11   Dg Chest 2 View  Result Date: 11/14/2015 CLINICAL DATA:  Chest pain and shortness breath. EXAM: CHEST  2 VIEW COMPARISON:  Two-view chest x-ray 04/20/2015 FINDINGS: The heart size and mediastinal contours are within normal limits. Both lungs are clear. The visualized skeletal structures are unremarkable. IMPRESSION: Negative two view chest x-ray Electronically Signed   By: San Morelle M.D.   On: 11/14/2015 21:58   Ct Angio Chest Pe W And/or Wo Contrast  Result Date: 11/15/2015 CLINICAL DATA:  Chest pain, positive D-dimer EXAM: CT ANGIOGRAPHY CHEST WITH CONTRAST TECHNIQUE: Multidetector CT imaging of the chest was performed using the standard protocol during bolus administration of intravenous contrast. Multiplanar CT image reconstructions and MIPs were obtained to evaluate the vascular anatomy. CONTRAST:  100 cc Isovue COMPARISON:  None. FINDINGS: Cardiovascular: Heart size within normal limits. No pericardial effusion. The study is of excellent technical quality. No pulmonary embolus is noted. No aortic aneurysm. Mediastinum/Nodes: No mediastinal hematoma or adenopathy. No hilar adenopathy. Lungs/Pleura: Images of the lung parenchyma shows no acute infiltrate or pulmonary edema. No focal  consolidation. Central airways are patent. Upper Abdomen: The visualized upper abdomen is unremarkable. Small amount of air is noted in distal esophagus. Musculoskeletal: No destructive bony lesions are noted. Sagittal images of the spine shows degenerative changes thoracic spine. Review of the MIP images confirms the above findings. IMPRESSION: 1. No pulmonary embolus is noted. 2. No acute infiltrate or pulmonary edema. 3. No mediastinal hematoma or adenopathy. 4. No aortic aneurysm. Electronically Signed   By: Lahoma Crocker M.D.   On: 11/15/2015 09:36    Disposition   Pt is being discharged home today in good condition.  Follow-up Plans & Appointments    Follow-up Information    Truitt Merle, NP  Follow up on 12/05/2015.   Specialties:  Nurse Practitioner, Interventional Cardiology, Cardiology, Radiology Why:  [place] on 12/05/2015 with 8:00AM.  (Dr. Kyla Balzarine Office) Contact information: [address] [place]  [address] [phone]        [facility] Follow up on 12/06/2015.   Why:  3:30 for follow up apt Contact information: [address] [place] [phone]         Discharge Instructions    Diet - low sodium heart healthy    Complete by:  As directed   Increase activity slowly    Complete by:  As directed      Discharge Medications   Current Discharge Medication List    START taking these medications   Details  furosemide (LASIX) 40 MG tablet Take 1 tablet (40 mg total) by mouth daily. Qty: 90 tablet, Refills: 3    hydrALAZINE (APRESOLINE) 25 MG tablet Take 1 tablet (25 mg total) by mouth every 8 (eight) hours. Qty: 90 tablet, Refills: 6    menthol-cetylpyridinium (CEPACOL) 3 MG lozenge Take 1 lozenge (3 mg total) by mouth as needed for sore throat. Qty: 100 tablet, Refills: 12      CONTINUE these medications which have CHANGED   Details  atenolol (TENORMIN) 100 MG tablet Take 1 tablet (100 mg total) by mouth daily. Qty: 90 tablet, Refills: 3   Associated Diagnoses: Essential hypertension    cloNIDine (CATAPRES) 0.2 MG tablet Take 1 tablet (0.2 mg total) by mouth 3 (three) times daily. Qty: 90 tablet, Refills: 5   Associated Diagnoses: Essential hypertension    spironolactone (ALDACTONE) 50 MG tablet Take 1 tablet (50 mg total) by mouth daily. Qty: 90 tablet, Refills: 3   Associated Diagnoses: Essential hypertension      CONTINUE these medications which have NOT CHANGED   Details  acetaminophen (TYLENOL) 325 MG tablet Take 650 mg by mouth every 6 (six) hours as needed for mild pain.    acetaminophen-codeine (TYLENOL #3) 300-30 MG tablet Take 1  tablet by mouth every 8 (eight) hours as needed for moderate pain. Qty: 60 tablet, Refills: 0   Associated Diagnoses: Bilateral anterior knee pain    aspirin EC 81 MG tablet Take 1 tablet (81 mg total) by mouth daily.   Associated Diagnoses: Other chest pain; Abnormal nuclear stress test    divalproex (DEPAKOTE ER) 250 MG 24 hr tablet Take 750 mg by mouth at bedtime.    ibuprofen (ADVIL,MOTRIN) 200 MG tablet Take 200 mg by mouth every 6 (six) hours as needed (pain).    loratadine (CLARITIN) 10 MG tablet Take 1 tablet (10 mg total) by mouth daily. Qty: 30 tablet, Refills: 11   Associated Diagnoses: Cough    metFORMIN (GLUCOPHAGE) 850 MG tablet  Take 1 tablet (850 mg total) by mouth every morning. Qty: 30 tablet, Refills: 5   Associated Diagnoses: Diabetes mellitus type 2 in obese (HCC)    nitroGLYCERIN (NITROSTAT) 0.4 MG SL tablet Place 1 tablet (0.4 mg total) under the tongue every 5 (five) minutes as needed for chest pain. Qty: 25 tablet, Refills: 3   Associated Diagnoses: Other chest pain; Abnormal nuclear stress test    nystatin (NYSTATIN) powder Apply topically 3 (three) times daily. Apply under breast to treat skin yeast infection Qty: 60 g, Refills: 0   Associated Diagnoses: Candidal intertrigo    omeprazole (PRILOSEC) 20 MG capsule Take 1 capsule (20 mg total) by mouth 2 (two) times daily before a meal. Qty: 60 capsule, Refills: 5   Associated Diagnoses: Gastroesophageal reflux disease, esophagitis presence not specified    oxybutynin (DITROPAN) 5 MG tablet TAKE 1 TABLET BY MOUTH TWICE DAILY. Qty: 60 tablet, Refills: 3    traZODone (DESYREL) 50 MG tablet Take 25-50 mg by mouth daily as needed for sleep.     doxycycline (VIBRAMYCIN) 100 MG capsule Take 1 capsule (100 mg total) by mouth 2 (two) times daily. Qty: 20 capsule, Refills: 0      STOP taking these medications     losartan (COZAAR) 100 MG tablet           Outstanding Labs/Studies   BMET  Duration of  Discharge Encounter   Greater than 30 minutes including physician time.  Signed, Bhagat,Bhavinkumar PA-C 11/26/2015, 1:09 PM  Patient seen and examined. I agree with the assessment and plan as detailed above. See also my additional thoughts below.   The patient is ready for discharge. I made the decision for discharge. I agree with the discharge note as outlined above.  Dola Argyle, MD, [facility] 11/27/2015 8:09 AM

## 2015-11-26 NOTE — Care Management Note (Signed)
Case Management Note  Patient Details  Name: BREON CAPOTE MRN: [id number] Date of Birth: 09-Jan-1961  Subjective/Objective:  Patient states she lives with her boyfriend, pta indep, she states she will need a bus pass at [employer], CSW referral made.  Patient is already established at [facility] clinic, appt scheduled. She will also go there to get her medications at discharge.   CSW is working with patient with  [employer].                 Action/Plan:   Expected Discharge Date:                  Expected Discharge Plan:  Home/Self Care  In-House Referral:  Clinical Social Work  Discharge planning Services  CM Consult, Follow-up appt scheduled, Medication Assistance  Post Acute Care Choice:    Choice offered to:     DME Arranged:    DME Agency:     HH Arranged:    [place] Agency:     Status of Service:  Completed, signed off  If discussed at [employer], dates discussed:    Additional Comments:  Zenon Mayo, RN 11/26/2015, 4:17 PM

## 2015-12-04 ENCOUNTER — Encounter: Payer: Self-pay | Admitting: Nurse Practitioner

## 2015-12-04 MED FILL — ?CLONIDINE HCL 0.2 MG TABLE: 0.2 | 30 days supply | Qty: 90 | Fill #0

## 2015-12-04 MED FILL — hydrALAZINE HCL 25 MG TABS: 25 | 30 days supply | Qty: 90 | Fill #0

## 2015-12-04 MED FILL — ?SPIRONOLACTONE 50 MG TABLE: 50 | 30 days supply | Qty: 30 | Fill #0

## 2015-12-04 MED FILL — ?FUROSEMIDE 40 MG TABLET: 40 | 30 days supply | Qty: 30 | Fill #0

## 2015-12-05 ENCOUNTER — Ambulatory Visit (INDEPENDENT_AMBULATORY_CARE_PROVIDER_SITE_OTHER): Payer: No Typology Code available for payment source | Admitting: Nurse Practitioner

## 2015-12-05 ENCOUNTER — Encounter: Payer: Self-pay | Admitting: Nurse Practitioner

## 2015-12-05 VITALS — BP 122/78 | HR 89 | Ht 64.0 in | Wt 260.0 lb

## 2015-12-05 DIAGNOSIS — I10 Essential (primary) hypertension: Secondary | ICD-10-CM

## 2015-12-05 DIAGNOSIS — E785 Hyperlipidemia, unspecified: Secondary | ICD-10-CM

## 2015-12-05 LAB — BASIC METABOLIC PANEL
BUN: 11 mg/dL (ref 7–25)
CO2: 24 mmol/L (ref 20–31)
Calcium: 9.5 mg/dL (ref 8.6–10.4)
Chloride: 104 mmol/L (ref 98–110)
Creat: 0.78 mg/dL (ref 0.50–1.05)
Glucose, Bld: 116 mg/dL — ABNORMAL HIGH (ref 65–99)
Potassium: 4.3 mmol/L (ref 3.5–5.3)
Sodium: 140 mmol/L (ref 135–146)

## 2015-12-05 NOTE — Patient Instructions (Addendum)
We will be checking the following labs today - BMET   Medication Instructions:    Continue with your current medicines.   Would take the Omeprazole twice a day    Testing/Procedures To Be Arranged:  N/A  Follow-Up:   See Dr. Johnsie Cancel back prn.     Other Special Instructions:   Talk with your PCP about your cough    If you need a refill on your cardiac medications before your next appointment, please call your pharmacy.   Call the [place] office at [phone] if you have any questions, problems or concerns.
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
## 2015-12-06 ENCOUNTER — Emergency Department (HOSPITAL_COMMUNITY)
Admission: EM | Admit: 2015-12-06 | Discharge: 2015-12-06 | Disposition: A | Discharge status: Home / Self Care | Payer: No Typology Code available for payment source | Attending: Emergency Medicine | Admitting: Emergency Medicine

## 2015-12-06 ENCOUNTER — Ambulatory Visit: Payer: No Typology Code available for payment source | Attending: Family Medicine | Admitting: Physician Assistant

## 2015-12-06 ENCOUNTER — Inpatient Hospital Stay: Payer: No Typology Code available for payment source | Admitting: Family Medicine

## 2015-12-06 ENCOUNTER — Encounter (HOSPITAL_COMMUNITY): Payer: Self-pay

## 2015-12-06 ENCOUNTER — Emergency Department (HOSPITAL_COMMUNITY): Payer: No Typology Code available for payment source

## 2015-12-06 VITALS — BP 80/60 | HR 74 | Temp 97.4°F | Resp 20 | Wt 260.0 lb

## 2015-12-06 DIAGNOSIS — R400 Somnolence: Secondary | ICD-10-CM

## 2015-12-06 DIAGNOSIS — R0789 Other chest pain: Secondary | ICD-10-CM

## 2015-12-06 DIAGNOSIS — Z7982 Long term (current) use of aspirin: Secondary | ICD-10-CM | POA: Insufficient documentation

## 2015-12-06 DIAGNOSIS — E139 Other specified diabetes mellitus without complications: Secondary | ICD-10-CM

## 2015-12-06 DIAGNOSIS — I10 Essential (primary) hypertension: Secondary | ICD-10-CM | POA: Insufficient documentation

## 2015-12-06 DIAGNOSIS — I209 Angina pectoris, unspecified: Secondary | ICD-10-CM

## 2015-12-06 DIAGNOSIS — Z7722 Contact with and (suspected) exposure to environmental tobacco smoke (acute) (chronic): Secondary | ICD-10-CM | POA: Insufficient documentation

## 2015-12-06 DIAGNOSIS — Z91040 Latex allergy status: Secondary | ICD-10-CM | POA: Insufficient documentation

## 2015-12-06 DIAGNOSIS — R42 Dizziness and giddiness: Secondary | ICD-10-CM

## 2015-12-06 DIAGNOSIS — Z79899 Other long term (current) drug therapy: Secondary | ICD-10-CM | POA: Insufficient documentation

## 2015-12-06 DIAGNOSIS — I951 Orthostatic hypotension: Secondary | ICD-10-CM

## 2015-12-06 DIAGNOSIS — Z7984 Long term (current) use of oral hypoglycemic drugs: Secondary | ICD-10-CM | POA: Insufficient documentation

## 2015-12-06 DIAGNOSIS — E119 Type 2 diabetes mellitus without complications: Secondary | ICD-10-CM | POA: Insufficient documentation

## 2015-12-06 LAB — URINALYSIS, ROUTINE W REFLEX MICROSCOPIC
BILIRUBIN URINE: NEGATIVE
GLUCOSE, UA: NEGATIVE mg/dL
HGB URINE DIPSTICK: NEGATIVE
Ketones, ur: NEGATIVE mg/dL
Leukocytes, UA: NEGATIVE
Nitrite: NEGATIVE
Protein, ur: NEGATIVE mg/dL
SPECIFIC GRAVITY, URINE: 1.01 (ref 1.005–1.030)
pH: 6 (ref 5.0–8.0)

## 2015-12-06 LAB — BASIC METABOLIC PANEL
ANION GAP: 8 (ref 5–15)
BUN: 13 mg/dL (ref 6–20)
CALCIUM: 9.4 mg/dL (ref 8.9–10.3)
CO2: 24 mmol/L (ref 22–32)
CREATININE: 0.96 mg/dL (ref 0.44–1.00)
Chloride: 105 mmol/L (ref 101–111)
Glucose, Bld: 91 mg/dL (ref 65–99)
Potassium: 3.7 mmol/L (ref 3.5–5.1)
SODIUM: 137 mmol/L (ref 135–145)

## 2015-12-06 LAB — I-STAT ARTERIAL BLOOD GAS, ED
ACID-BASE DEFICIT: 2 mmol/L (ref 0.0–2.0)
Bicarbonate: 22.6 mmol/L (ref 20.0–28.0)
O2 SAT: 93 %
PH ART: 7.379 (ref 7.350–7.450)
PO2 ART: 69 mmHg — AB (ref 83.0–108.0)
Patient temperature: 98.6
TCO2: 24 mmol/L (ref 0–100)
pCO2 arterial: 38.3 mmHg (ref 32.0–48.0)

## 2015-12-06 LAB — CBC
HCT: 37.3 % (ref 36.0–46.0)
HEMOGLOBIN: 12.3 g/dL (ref 12.0–15.0)
MCH: 31.4 pg (ref 26.0–34.0)
MCHC: 33 g/dL (ref 30.0–36.0)
MCV: 95.2 fL (ref 78.0–100.0)
PLATELETS: 351 10*3/uL (ref 150–400)
RBC: 3.92 MIL/uL (ref 3.87–5.11)
RDW: 14.1 % (ref 11.5–15.5)
WBC: 10.8 10*3/uL — ABNORMAL HIGH (ref 4.0–10.5)

## 2015-12-06 LAB — RAPID URINE DRUG SCREEN, HOSP PERFORMED
AMPHETAMINES: NOT DETECTED
BARBITURATES: NOT DETECTED
Benzodiazepines: NOT DETECTED
Cocaine: NOT DETECTED
OPIATES: NOT DETECTED
TETRAHYDROCANNABINOL: NOT DETECTED

## 2015-12-06 LAB — I-STAT CG4 LACTIC ACID, ED: Lactic Acid, Venous: 2.19 mmol/L (ref 0.5–1.9)

## 2015-12-06 LAB — I-STAT TROPONIN, ED
TROPONIN I, POC: 0.01 ng/mL (ref 0.00–0.08)
TROPONIN I, POC: 0.01 ng/mL (ref 0.00–0.08)

## 2015-12-06 LAB — GLUCOSE, POCT (MANUAL RESULT ENTRY): POC GLUCOSE: 153 mg/dL — AB (ref 70–99)

## 2015-12-06 MED ORDER — NALOXONE HCL 0.4 MG/ML IJ SOLN
0.4000 mg | Freq: Once | INTRAMUSCULAR | Status: AC
  Administered 2015-12-06: 0.4 mg via INTRAVENOUS
  Filled 2015-12-06: qty 1

## 2015-12-06 MED ORDER — SODIUM CHLORIDE 0.9 % IV BOLUS (SEPSIS)
1000.0000 mL | Freq: Once | INTRAVENOUS | Status: AC
  Administered 2015-12-06: 1000 mL via INTRAVENOUS

## 2015-12-06 MED FILL — ?LOSARTAN POTASSIUM 100 MG: 100 | 30 days supply | Qty: 30 | Fill #0

## 2015-12-06 NOTE — ED Notes (Signed)
Pt ambulated to and from bathroom with standby assistance from NT. Pt able to ambulate under own power, but did report some dizziness near the end. Denies pain, SOB.

## 2015-12-06 NOTE — ED Notes (Addendum)
Patient came in by [facility] with hypotension, chest pain, and lethargy. Patient went to PCP for a regular check-up this AM and was hypotensive and started having heaviness in her chest. EMS noted patient became more lethargic en route to ED. BP is now 105/78. Patient states she feels nauseous, SOB. Hx of high cholesterol and DM. EMS fsbs was 156. Patient is extremely lethargic, falling asleep during conversation. Patient also has productive cough, with white sputum per patient.

## 2015-12-06 NOTE — Care Management (Signed)
ED CM spoke with [place] regarding follow up at the [facility] in the morning. Patient instructed to be there tomorrow at [place] patient verbalized understanding teach back done. Dr. Christy Gentles updated with discharge plan. No further ED CM needs identified.

## 2015-12-06 NOTE — Discharge Instructions (Signed)
° °  Do not take any further medications tonight or tomorrow morning until you are seen by your doctor in clinic in the morning at 9am for your appointment.

## 2015-12-06 NOTE — ED Notes (Signed)
Pt departed in NAD.  

## 2015-12-06 NOTE — ED Notes (Addendum)
Pt had previous difficulty using a bedpan, so assisted pt to bedside commode without difficulty.

## 2015-12-06 NOTE — ED Notes (Signed)
Pt. Attempt to use bedpan without success.
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
## 2015-12-06 NOTE — ED Triage Notes (Signed)
Pt. From PCP office via EMS for evaluation of hypotension. Pt. Was at routine check up when PCP noticed pt to be hypotensive at 80/60. Pt. AxO x4, EMS reports increase in lethargy during transport. Pt. Recently admitted for elevated d-dimer. Pt. Given 250 ml NS in route. EMS reports positive orthostatic changes; [id number] systolic lying, 80 sitting, 70 standing.

## 2015-12-06 NOTE — Progress Notes (Signed)
Patient presents for BP f/u s/p recent hospital admission.  Hypotensive, lethargic, intermittent chest pain and dizziness/abnormal gait. EMS contacted for patient transport due symptoms/significant history.

## 2015-12-06 NOTE — Progress Notes (Signed)
Patient ID: Sabrina [name], female   DOB: September 17, 1960, 55 y.o.   MRN: [id number]   Sabrina [name], is a 55 y.o. female  [id number]  [id number]  DOB - 03/20/1961  Subjective:  Chief Complaint and HPI: Sabrina [name] is a 55 y.o. female here today for a follow-up after recent hospitalization 8/24-8/26/2017 with CP, +cardiac enzymes and + D-Dimer.  She presents today with CP since about 3am and not feeling well.  She is dizzy X 1 hour.  We called EMS due to co morbidities and recent hospitalization and orthostatic hypotension  ED/Hospital notes reviewed.     ROS:   Constitutional:  No f/c, No night sweats, No unexplained weight loss. EENT:  No vision changes, No blurry vision, No hearing changes. No mouth, throat, or ear problems.  Respiratory: No cough, No SOB Cardiac: + CP, no palpitations GI:  No abd pain, No V/D. +nausea GU: No Urinary s/sx Musculoskeletal: No joint pain Neuro: No headache, + dizziness, no motor weakness.  Skin: No rash Endocrine:  No polydipsia. No polyuria.  Psych: Denies SI/HI  No problems updated.  ALLERGIES: Allergies  Allergen Reactions  . Benzonatate Other (See Comments)    Made her cough worse  . Latex Other (See Comments)    Hands were scaly  . Penicillins Other (See Comments)    Unknown childhood allergic reaction - no other information available  . Lisinopril Cough  . Oxycodone-Acetaminophen Nausea And Vomiting    Pt thinks she may be able to take with food    PAST MEDICAL HISTORY: Past Medical History:  Diagnosis Date  . Anxiety   . Arthritis    "legs, knees, hands" (11/16/2014)  . Bipolar disorder ([place])    2 breakdowns - 1998, 2000 had to be hospitalized, followed at [facility]  . Chest pain    a. Myoview 6/16:  anterior and apical ischemia, EF 55-65%;  b. LHC 8/16:  no CAD, Normal EF  . Chest pain 10/2015  . Chronic bronchitis ([place])    "get it q yr"  . Chronic lower back pain   . Depression   . GERD (gastroesophageal reflux  disease)   . Gout   . History of echocardiogram    a. Echo 12/15:  Mild LVH, EF 55-60%, mild LAE, PASP 36 mmHg  . Hyperlipidemia LDL goal < 100    "not on RX" (11/15/2014)  . Hypertension   . Migraine    "monthly" (11/16/2014)  . Mixed restrictive and obstructive lung disease ([place])    Health serve chart suggests PFTs done 1/10  . Morbid obesity with BMI of 40.0-44.9, adult ([place])   . Rheumatoid arthritis [facility])    Health serve records indicate Rheumatoid  . Seizures ([place])    "might have had 1; I'm on depakote" (11/16/2014)  . Type II diabetes mellitus ([place])     MEDICATIONS AT HOME: Prior to Admission medications   Medication Sig Start Date End Date Taking? Authorizing Provider  acetaminophen (TYLENOL) 325 MG tablet Take 650 mg by mouth every 6 (six) hours as needed for mild pain.    Historical Provider, MD  acetaminophen-codeine (TYLENOL #3) 300-30 MG tablet Take 1 tablet by mouth every 8 (eight) hours as needed for moderate pain. 07/24/15   Boykin Nearing, MD  aspirin EC 81 MG tablet Take 1 tablet (81 mg total) by mouth daily. 08/06/15   Josue Hector, MD  atenolol (TENORMIN) 100 MG tablet Take 1 tablet (100 mg total) by mouth daily. 11/26/15  Bhavinkumar Bhagat, PA  cloNIDine (CATAPRES) 0.2 MG tablet Take 1 tablet (0.2 mg total) by mouth 3 (three) times daily. 11/26/15   Bhavinkumar Bhagat, PA  divalproex (DEPAKOTE ER) 250 MG 24 hr tablet Take 750 mg by mouth at bedtime.    Historical Provider, MD  doxycycline (VIBRAMYCIN) 100 MG capsule Take 1 capsule (100 mg total) by mouth 2 (two) times daily. 10/29/15   Montine Circle, PA-C  furosemide (LASIX) 40 MG tablet Take 1 tablet (40 mg total) by mouth daily. 11/26/15   Bhavinkumar Bhagat, PA  hydrALAZINE (APRESOLINE) 25 MG tablet Take 1 tablet (25 mg total) by mouth every 8 (eight) hours. 11/26/15   Bhavinkumar Bhagat, PA  ibuprofen (ADVIL,MOTRIN) 200 MG tablet Take 200 mg by mouth every 6 (six) hours as needed (pain).    Historical Provider, MD    loratadine (CLARITIN) 10 MG tablet Take 1 tablet (10 mg total) by mouth daily. 07/24/15   Josalyn Funches, MD  menthol-cetylpyridinium (CEPACOL) 3 MG lozenge Take 1 lozenge (3 mg total) by mouth as needed for sore throat. 11/26/15   Bhavinkumar Bhagat, PA  metFORMIN (GLUCOPHAGE) 850 MG tablet Take 1 tablet (850 mg total) by mouth every morning. 07/12/15   Josalyn Funches, MD  nitroGLYCERIN (NITROSTAT) 0.4 MG SL tablet Place 1 tablet (0.4 mg total) under the tongue every 5 (five) minutes as needed for chest pain. 11/10/14   Liliane Shi, PA-C  nystatin (NYSTATIN) powder Apply topically 3 (three) times daily. Apply under breast to treat skin yeast infection Patient taking differently: Apply topically 3 (three) times daily as needed (itching/ apply under breast to treat skin yeast infection).  10/11/15   Josalyn Funches, MD  omeprazole (PRILOSEC) 20 MG capsule Take 1 capsule (20 mg total) by mouth 2 (two) times daily before a meal. Patient taking differently: Take 20 mg by mouth daily.  07/12/15   Josalyn Funches, MD  oxybutynin (DITROPAN) 5 MG tablet TAKE 1 TABLET BY MOUTH TWICE DAILY. 06/26/15   Lance Bosch, NP  spironolactone (ALDACTONE) 50 MG tablet Take 1 tablet (50 mg total) by mouth daily. 11/26/15   Bhavinkumar Bhagat, PA  traZODone (DESYREL) 50 MG tablet Take 25-50 mg by mouth daily as needed for sleep.     Historical Provider, MD     Objective:  EXAM:   Vitals:   12/06/15 1625  BP: (!) 80/60  Pulse: 74  Resp: 20  Temp: 97.4 F (36.3 C)  TempSrc: Oral  SpO2: 99%  Weight: 260 lb (117.9 kg)    General appearance : A&OX3. NAD. Appears pale and weak, skin is clammy HEENT: Atraumatic and Normocephalic.  PERRLA. EOM intact.  Neck: supple, no JVD. No cervical lymphadenopathy. No thyromegaly Chest/Lungs:  Breathing-non-labored, Good air entry bilaterally, breath sounds normal without rales, rhonchi, or wheezing  CVS: S1 S2 regular with increased rate(~90), no murmurs, gallops, rubs   Extremities: Bilateral Lower Ext shows no edema, both legs are warm to touch with = pulse throughout Neurology:  CN II-XII grossly intact, Non focal.   Psych:  TP linear. J/I WNL. Normal speech. Appropriate eye contact and affect.  Skin:  No Rash  Data Review Lab Results  Component Value Date   HGBA1C 5.6 10/11/2015   HGBA1C 5.50 05/21/2015   HGBA1C 5.60 02/28/2015     Assessment & Plan   1. Diabetes 1.5, managed as type 2 (HCC) - Glucose (CBG)  2. Ischemic chest pain ([place]) To ED via EMS  3. Dizziness and giddiness To ED  4. Orthostatic hypotension To ED   Patient have been counseled extensively about nutrition and exercise  No Follow-up on file.  The patient was given clear instructions to go to ER or return to medical center if symptoms don't improve, worsen or new problems develop. The patient verbalized understanding. The patient was told to call to get lab results if they haven't heard anything in the next week.     Freeman Caldron, PA-C [facility] and [place], [place]   12/06/2015, 4:38 PM

## 2015-12-07 ENCOUNTER — Encounter: Payer: Self-pay | Admitting: Family Medicine

## 2015-12-07 ENCOUNTER — Ambulatory Visit: Payer: No Typology Code available for payment source | Attending: Family Medicine | Admitting: Family Medicine

## 2015-12-07 VITALS — BP 174/86 | HR 85 | Temp 98.7°F | Wt 264.8 lb

## 2015-12-07 DIAGNOSIS — R059 Cough, unspecified: Secondary | ICD-10-CM

## 2015-12-07 DIAGNOSIS — R05 Cough: Secondary | ICD-10-CM

## 2015-12-07 DIAGNOSIS — E119 Type 2 diabetes mellitus without complications: Secondary | ICD-10-CM | POA: Insufficient documentation

## 2015-12-07 DIAGNOSIS — I10 Essential (primary) hypertension: Secondary | ICD-10-CM | POA: Insufficient documentation

## 2015-12-07 DIAGNOSIS — Z7984 Long term (current) use of oral hypoglycemic drugs: Secondary | ICD-10-CM | POA: Insufficient documentation

## 2015-12-07 DIAGNOSIS — Z23 Encounter for immunization: Secondary | ICD-10-CM

## 2015-12-07 DIAGNOSIS — Z79899 Other long term (current) drug therapy: Secondary | ICD-10-CM | POA: Insufficient documentation

## 2015-12-07 DIAGNOSIS — Z7722 Contact with and (suspected) exposure to environmental tobacco smoke (acute) (chronic): Secondary | ICD-10-CM | POA: Insufficient documentation

## 2015-12-07 LAB — GLUCOSE, POCT (MANUAL RESULT ENTRY): POC GLUCOSE: 97 mg/dL (ref 70–99)

## 2015-12-07 MED ORDER — SPIRONOLACTONE 50 MG PO TABS: 50.0000 mg | ORAL_TABLET | Freq: Every day | ORAL | 5 refills | Status: DC

## 2015-12-07 MED ORDER — HYDRALAZINE HCL 25 MG PO TABS
25.0000 mg | ORAL_TABLET | Freq: Three times a day (TID) | ORAL | 5 refills | Status: DC
Start: 2015-12-07 — End: 2016-08-26

## 2015-12-07 MED ORDER — GUAIFENESIN ER 600 MG PO TB12: 600.0000 mg | ORAL_TABLET | Freq: Two times a day (BID) | ORAL | 5 refills | Status: DC | PRN

## 2015-12-07 MED ORDER — CLONIDINE HCL 0.2 MG PO TABS: 0.2000 mg | ORAL_TABLET | Freq: Three times a day (TID) | ORAL | 5 refills | Status: DC

## 2015-12-07 NOTE — Progress Notes (Signed)
Subjective:  Patient ID: Sabrina [name], female    DOB: February 28, 1961  Age: 55 y.o. MRN: [id number]  CC: Hospitalization Follow-up   HPI Sabrina [name] has bipolar disorder, HTN, RA, DM2, obesity she  presents for    1. HFU hypotension: she was hypotensive in office and sent to ED she was also bradycardic. Labs wnl. She was treated with 2 L NS and felt better. She was asked to stop all medications until her f.u appt. She presents today with her medications. She has multiple bottles of atenolol and lasix, she also has aldactone and clonidine and hydralazine. She reports that she could have taking too much diuretics.   Social History  Substance Use Topics  . Smoking status: Passive Smoke Exposure - Never Smoker  . Smokeless tobacco: Never Used     Comment: Mother & Grandfather.  . Alcohol use No    Outpatient Medications Prior to Visit  Medication Sig Dispense Refill  . acetaminophen (TYLENOL) 325 MG tablet Take 650 mg by mouth every 6 (six) hours as needed for mild pain.    Marland Kitchen acetaminophen-codeine (TYLENOL #3) 300-30 MG tablet Take 1 tablet by mouth every 8 (eight) hours as needed for moderate pain. 60 tablet 0  . aspirin EC 81 MG tablet Take 1 tablet (81 mg total) by mouth daily. (Patient taking differently: Take 81 mg by mouth every morning. )    . atenolol (TENORMIN) 100 MG tablet Take 1 tablet (100 mg total) by mouth daily. 90 tablet 3  . cloNIDine (CATAPRES) 0.2 MG tablet Take 1 tablet (0.2 mg total) by mouth 3 (three) times daily. 90 tablet 5  . divalproex (DEPAKOTE ER) 250 MG 24 hr tablet Take 750 mg by mouth at bedtime.    . furosemide (LASIX) 40 MG tablet Take 1 tablet (40 mg total) by mouth daily. 90 tablet 3  . hydrALAZINE (APRESOLINE) 25 MG tablet Take 1 tablet (25 mg total) by mouth every 8 (eight) hours. 90 tablet 6  . ibuprofen (ADVIL,MOTRIN) 200 MG tablet Take 200 mg by mouth every 6 (six) hours as needed (pain).    Marland Kitchen loratadine (CLARITIN) 10 MG tablet Take 1 tablet (10  mg total) by mouth daily. 30 tablet 11  . metFORMIN (GLUCOPHAGE) 850 MG tablet Take 1 tablet (850 mg total) by mouth every morning. 30 tablet 5  . nitroGLYCERIN (NITROSTAT) 0.4 MG SL tablet Place 1 tablet (0.4 mg total) under the tongue every 5 (five) minutes as needed for chest pain. 25 tablet 3  . nystatin (NYSTATIN) powder Apply topically 3 (three) times daily. Apply under breast to treat skin yeast infection (Patient taking differently: Apply topically 3 (three) times daily as needed (itching/ apply under breast to treat skin yeast infection). ) 60 g 0  . omeprazole (PRILOSEC) 20 MG capsule Take 1 capsule (20 mg total) by mouth 2 (two) times daily before a meal. (Patient taking differently: Take 20 mg by mouth daily. ) 60 capsule 5  . oxybutynin (DITROPAN) 5 MG tablet TAKE 1 TABLET BY MOUTH TWICE DAILY. (Patient taking differently: TAKE 1 TABLET (5 mg) BY MOUTH TWICE DAILY.) 60 tablet 3  . spironolactone (ALDACTONE) 50 MG tablet Take 1 tablet (50 mg total) by mouth daily. 90 tablet 3  . traZODone (DESYREL) 50 MG tablet Take 25-50 mg by mouth daily as needed for sleep.      No facility-administered medications prior to visit.     ROS Review of Systems  Constitutional: Negative for chills  and fever.  Eyes: Negative for visual disturbance.  Respiratory: Negative for shortness of breath.   Cardiovascular: Negative for chest pain.  Gastrointestinal: Negative for abdominal pain and blood in stool.  Musculoskeletal: Negative for arthralgias and back pain.  Skin: Negative for rash.  Allergic/Immunologic: Negative for immunocompromised state.  Hematological: Negative for adenopathy. Does not bruise/bleed easily.  Psychiatric/Behavioral: Negative for dysphoric mood and suicidal ideas.    Objective:  BP (!) 174/86 (BP Location: Right Arm, Patient Position: Sitting, Cuff Size: Large)   Pulse 85   Temp 98.7 F (37.1 C) (Oral)   Wt 264 lb 12.8 oz (120.1 kg)   SpO2 100%   BMI 45.45 kg/m    BP/Weight 12/07/2015 [id number] Q000111Q  Systolic BP [id number] 80  Diastolic BP 86 79 60  Wt. (Lbs) 264.8 - 260  BMI 45.45 - 44.63   Wt Readings from Last 3 Encounters:  12/07/15 264 lb 12.8 oz (120.1 kg)  12/06/15 260 lb (117.9 kg)  12/05/15 260 lb (117.9 kg)    Physical Exam  Constitutional: She is oriented to person, place, and time. She appears well-developed and well-nourished. No distress.  HENT:  Head: Normocephalic and atraumatic.  Cardiovascular: Normal rate, regular rhythm, normal heart sounds and intact distal pulses.   Pulmonary/Chest: Effort normal and breath sounds normal.  Musculoskeletal: She exhibits edema (trace LE edema ).  Neurological: She is alert and oriented to person, place, and time.  Skin: Skin is warm and dry. No rash noted.  Psychiatric: She has a normal mood and affect.     Chemistry      Component Value Date/Time   NA 137 12/06/2015 1749   K 3.7 12/06/2015 1749   CL 105 12/06/2015 1749   CO2 24 12/06/2015 1749   BUN 13 12/06/2015 1749   CREATININE 0.96 12/06/2015 1749   CREATININE 0.78 12/05/2015 0848      Component Value Date/Time   CALCIUM 9.4 12/06/2015 1749   ALKPHOS 61 10/29/2015 1432   AST 25 10/29/2015 1432   ALT 20 10/29/2015 1432   BILITOT 0.4 10/29/2015 1432       Lab Results  Component Value Date   HGBA1C 5.6 10/11/2015   CBG 97 Assessment & Plan:   Sabrina [name] was seen today for hospitalization follow-up.  Diagnoses and all orders for this visit:  Type 2 diabetes mellitus without complication, without long-term current use of insulin (HCC) -     POCT glucose (manual entry) -     Microalbumin/Creatinine Ratio, Urine  Essential hypertension -     spironolactone (ALDACTONE) 50 MG tablet; Take 1 tablet (50 mg total) by mouth daily. -     hydrALAZINE (APRESOLINE) 25 MG tablet; Take 1 tablet (25 mg total) by mouth every 8 (eight) hours. -     cloNIDine (CATAPRES) 0.2 MG tablet; Take 1 tablet (0.2 mg total) by mouth 3 (three)  times daily.   No orders of the defined types were placed in this encounter.   Follow-up: Return in about 3 weeks (around 12/28/2015) for HTN, BP and weight check .   Boykin Nearing MD

## 2015-12-07 NOTE — Progress Notes (Signed)
C/C: pt was in the ER for dizziness, low BP

## 2015-12-07 NOTE — Patient Instructions (Addendum)
Sabrina [name] was seen today for hospitalization follow-up.  Diagnoses and all orders for this visit:  Type 2 diabetes mellitus without complication, without long-term current use of insulin (HCC) -     POCT glucose (manual entry) -     Microalbumin/Creatinine Ratio, Urine  Essential hypertension -     spironolactone (ALDACTONE) 50 MG tablet; Take 1 tablet (50 mg total) by mouth daily. -     hydrALAZINE (APRESOLINE) 25 MG tablet; Take 1 tablet (25 mg total) by mouth every 8 (eight) hours. -     cloNIDine (CATAPRES) 0.2 MG tablet; Take 1 tablet (0.2 mg total) by mouth 3 (three) times daily.  Cough -     guaiFENesin (MUCINEX) 600 MG 12 hr tablet; Take 1 tablet (600 mg total) by mouth 2 (two) times daily as needed for cough.   STOP atenolol and lasix   F/u in 2-3 weeks for weight and BP check  Dr. Adrian Blackwater

## 2015-12-08 ENCOUNTER — Inpatient Hospital Stay: Payer: No Typology Code available for payment source | Admitting: Family Medicine

## 2015-12-08 LAB — MICROALBUMIN / CREATININE URINE RATIO
CREATININE, URINE: 74 mg/dL (ref 20–320)
MICROALB UR: 0.2 mg/dL
Microalb Creat Ratio: 3 mcg/mg creat (ref <30)

## 2015-12-09 NOTE — Assessment & Plan Note (Signed)
A: HTN with recent hypotension and bradycardia P: Stop lasix and atenolol Continue hydralazine, clonidine and spironolactone

## 2015-12-11 ENCOUNTER — Ambulatory Visit: Payer: No Typology Code available for payment source | Admitting: Pharmacist

## 2015-12-11 ENCOUNTER — Other Ambulatory Visit: Payer: Self-pay | Admitting: Family Medicine

## 2015-12-11 ENCOUNTER — Telehealth: Payer: Self-pay | Admitting: Family Medicine

## 2015-12-11 DIAGNOSIS — M25562 Pain in left knee: Principal | ICD-10-CM

## 2015-12-11 DIAGNOSIS — M25561 Pain in right knee: Secondary | ICD-10-CM

## 2015-12-11 MED ORDER — ACETAMINOPHEN-CODEINE #3 300-30 MG PO TABS: 1.0000 | ORAL_TABLET | Freq: Three times a day (TID) | ORAL | 0 refills | Status: DC | PRN

## 2015-12-13 ENCOUNTER — Encounter: Payer: Self-pay | Admitting: Family Medicine

## 2015-12-13 DIAGNOSIS — R05 Cough: Secondary | ICD-10-CM

## 2015-12-13 DIAGNOSIS — R053 Chronic cough: Secondary | ICD-10-CM

## 2015-12-17 ENCOUNTER — Encounter: Payer: Self-pay | Admitting: Family Medicine

## 2015-12-17 ENCOUNTER — Ambulatory Visit: Payer: No Typology Code available for payment source | Attending: Family Medicine | Admitting: Family Medicine

## 2015-12-17 VITALS — BP 142/82 | HR 95 | Temp 98.1°F | Ht 64.0 in | Wt 258.6 lb

## 2015-12-17 DIAGNOSIS — IMO0001 Reserved for inherently not codable concepts without codable children: Secondary | ICD-10-CM | POA: Insufficient documentation

## 2015-12-17 DIAGNOSIS — Z7982 Long term (current) use of aspirin: Secondary | ICD-10-CM | POA: Insufficient documentation

## 2015-12-17 DIAGNOSIS — E1169 Type 2 diabetes mellitus with other specified complication: Secondary | ICD-10-CM

## 2015-12-17 DIAGNOSIS — I10 Essential (primary) hypertension: Secondary | ICD-10-CM | POA: Insufficient documentation

## 2015-12-17 DIAGNOSIS — E669 Obesity, unspecified: Secondary | ICD-10-CM | POA: Insufficient documentation

## 2015-12-17 DIAGNOSIS — Z7722 Contact with and (suspected) exposure to environmental tobacco smoke (acute) (chronic): Secondary | ICD-10-CM | POA: Insufficient documentation

## 2015-12-17 DIAGNOSIS — K921 Melena: Secondary | ICD-10-CM

## 2015-12-17 DIAGNOSIS — R059 Cough, unspecified: Secondary | ICD-10-CM

## 2015-12-17 DIAGNOSIS — E119 Type 2 diabetes mellitus without complications: Secondary | ICD-10-CM | POA: Insufficient documentation

## 2015-12-17 DIAGNOSIS — Z7984 Long term (current) use of oral hypoglycemic drugs: Secondary | ICD-10-CM | POA: Insufficient documentation

## 2015-12-17 DIAGNOSIS — R350 Frequency of micturition: Secondary | ICD-10-CM | POA: Insufficient documentation

## 2015-12-17 DIAGNOSIS — M170 Bilateral primary osteoarthritis of knee: Secondary | ICD-10-CM | POA: Insufficient documentation

## 2015-12-17 DIAGNOSIS — R05 Cough: Secondary | ICD-10-CM | POA: Insufficient documentation

## 2015-12-17 LAB — HEMOCCULT GUIAC POC 1CARD (OFFICE): FECAL OCCULT BLD: NEGATIVE

## 2015-12-17 LAB — POCT URINALYSIS DIPSTICK
Bilirubin, UA: NEGATIVE
Blood, UA: NEGATIVE
GLUCOSE UA: NEGATIVE
Ketones, UA: NEGATIVE
NITRITE UA: NEGATIVE
PROTEIN UA: NEGATIVE
SPEC GRAV UA: 1.01
UROBILINOGEN UA: 0.2
pH, UA: 5

## 2015-12-17 LAB — GLUCOSE, POCT (MANUAL RESULT ENTRY): POC Glucose: 132 mg/dl — AB (ref 70–99)

## 2015-12-17 MED ORDER — DOXYCYCLINE HYCLATE 100 MG PO TABS: 100.0000 mg | ORAL_TABLET | Freq: Two times a day (BID) | ORAL | 0 refills | Status: DC

## 2015-12-17 MED FILL — ALL DAY ALLERGY 10 MG TAB: 10 | 30 days supply | Qty: 30 | Fill #2

## 2015-12-17 MED FILL — DOXYCYCLINE 100 MG TABLET: 100 | 10 days supply | Qty: 20 | Fill #0

## 2015-12-17 NOTE — Patient Instructions (Addendum)
Sabrina [name] was seen today for hypertension and cough.  Diagnoses and all orders for this visit:  Diabetes mellitus type 2 in obese ([place]) -     POCT glucose (manual entry)  Cough -     doxycycline (VIBRA-TABS) 100 MG tablet; Take 1 tablet (100 mg total) by mouth 2 (two) times daily.  Primary osteoarthritis of both knees -     AMB referral to sports medicine  Frequency -     Urinalysis Dipstick  Blood in stool -     Hemoccult - 1 Card (office)   F/u in 3 weeks for cough   Dr. Adrian Blackwater

## 2015-12-17 NOTE — Assessment & Plan Note (Signed)
Chronic cough x 2 months  Add back claritin Course of doxycyline

## 2015-12-17 NOTE — Assessment & Plan Note (Signed)
A: urinary frequency P: UA wnl except trace LE Send urine for culture

## 2015-12-17 NOTE — Progress Notes (Signed)
Subjective:  Patient ID: Sabrina [name], female    DOB: 12/23/1960  Age: 55 y.o. MRN: [id number]  CC: Hypertension   HPI Sabrina [name] presents for    1. Chronic cough: x 2 months. With headache. Intermittently productive. No SOB. Tickle in throat. Some chest pains. She had a CXR done 1 week ago that was normal.   2.  Blood in stool: noted this AM with hard bowel movement. She had BM x 2. Second BM with no blood. She has some nausea and lower abdominal pain. She has urinary frequency but has hx of overactive bladder.   Social History  Substance Use Topics  . Smoking status: Passive Smoke Exposure - Never Smoker  . Smokeless tobacco: Never Used     Comment: Mother & Grandfather.  . Alcohol use No    Outpatient Medications Prior to Visit  Medication Sig Dispense Refill  . acetaminophen (TYLENOL) 325 MG tablet Take 650 mg by mouth every 6 (six) hours as needed for mild pain.    Marland Kitchen acetaminophen-codeine (TYLENOL #3) 300-30 MG tablet Take 1 tablet by mouth every 8 (eight) hours as needed for moderate pain. 60 tablet 0  . aspirin EC 81 MG tablet Take 1 tablet (81 mg total) by mouth daily. (Patient taking differently: Take 81 mg by mouth every morning. )    . cloNIDine (CATAPRES) 0.2 MG tablet Take 1 tablet (0.2 mg total) by mouth 3 (three) times daily. 90 tablet 5  . divalproex (DEPAKOTE ER) 250 MG 24 hr tablet Take 750 mg by mouth at bedtime.    Marland Kitchen guaiFENesin (MUCINEX) 600 MG 12 hr tablet Take 1 tablet (600 mg total) by mouth 2 (two) times daily as needed for cough. 60 tablet 5  . hydrALAZINE (APRESOLINE) 25 MG tablet Take 1 tablet (25 mg total) by mouth every 8 (eight) hours. 90 tablet 5  . ibuprofen (ADVIL,MOTRIN) 200 MG tablet Take 200 mg by mouth every 6 (six) hours as needed (pain).    Marland Kitchen loratadine (CLARITIN) 10 MG tablet Take 1 tablet (10 mg total) by mouth daily. 30 tablet 11  . metFORMIN (GLUCOPHAGE) 850 MG tablet Take 1 tablet (850 mg total) by mouth every morning. 30 tablet  5  . nitroGLYCERIN (NITROSTAT) 0.4 MG SL tablet Place 1 tablet (0.4 mg total) under the tongue every 5 (five) minutes as needed for chest pain. 25 tablet 3  . nystatin (NYSTATIN) powder Apply topically 3 (three) times daily. Apply under breast to treat skin yeast infection (Patient taking differently: Apply topically 3 (three) times daily as needed (itching/ apply under breast to treat skin yeast infection). ) 60 g 0  . omeprazole (PRILOSEC) 20 MG capsule Take 1 capsule (20 mg total) by mouth 2 (two) times daily before a meal. (Patient taking differently: Take 20 mg by mouth daily. ) 60 capsule 5  . oxybutynin (DITROPAN) 5 MG tablet TAKE 1 TABLET BY MOUTH TWICE DAILY. (Patient taking differently: TAKE 1 TABLET (5 mg) BY MOUTH TWICE DAILY.) 60 tablet 3  . spironolactone (ALDACTONE) 50 MG tablet Take 1 tablet (50 mg total) by mouth daily. 30 tablet 5  . traZODone (DESYREL) 50 MG tablet Take 25-50 mg by mouth daily as needed for sleep.      No facility-administered medications prior to visit.     ROS Review of Systems  Constitutional: Negative for chills and fever.  Eyes: Negative for visual disturbance.  Respiratory: Negative for shortness of breath.   Cardiovascular: Negative for  chest pain.  Gastrointestinal: Positive for abdominal pain, blood in stool, constipation and nausea. Negative for abdominal distention, diarrhea and vomiting.  Genitourinary: Positive for frequency.  Musculoskeletal: Positive for arthralgias. Negative for back pain.  Skin: Negative for rash.  Allergic/Immunologic: Negative for immunocompromised state.  Hematological: Negative for adenopathy. Does not bruise/bleed easily.  Psychiatric/Behavioral: Negative for dysphoric mood and suicidal ideas.    Objective:  BP (!) 142/82 (BP Location: Left Arm, Patient Position: Sitting, Cuff Size: Large)   Pulse 95   Temp 98.1 F (36.7 C) (Oral)   Ht 5\' 4"  (1.626 m)   Wt 258 lb 9.6 oz (117.3 kg)   SpO2 96%   BMI 44.39 kg/m    BP/Weight 12/17/2015 [id number] Q000111Q  Systolic BP A999333 [id number]  Diastolic BP 82 86 79  Wt. (Lbs) 258.6 264.8 -  BMI 44.39 45.45 -   Wt Readings from Last 3 Encounters:  12/17/15 258 lb 9.6 oz (117.3 kg)  12/07/15 264 lb 12.8 oz (120.1 kg)  12/06/15 260 lb (117.9 kg)    Physical Exam  Constitutional: She is oriented to person, place, and time. She appears well-developed and well-nourished. No distress.  HENT:  Head: Normocephalic and atraumatic.  Cardiovascular: Normal rate, regular rhythm, normal heart sounds and intact distal pulses.   Pulmonary/Chest: Effort normal and breath sounds normal.  Genitourinary: Rectum normal. Rectal exam shows no external hemorrhoid, no internal hemorrhoid, no fissure, no mass, no tenderness, anal tone normal and guaiac negative stool.  Musculoskeletal: She exhibits edema (trace LE edema ).  Neurological: She is alert and oriented to person, place, and time.  Skin: Skin is warm and dry. No rash noted.  Psychiatric: She has a normal mood and affect.   Lab Results  Component Value Date   HGBA1C 5.6 10/11/2015   CBG 132   UA: wnl, trace LE only   Assessment & Plan:  Sabrina [name] was seen today for hypertension and cough.  Diagnoses and all orders for this visit:  Diabetes mellitus type 2 in obese ([place]) -     POCT glucose (manual entry)  Cough -     doxycycline (VIBRA-TABS) 100 MG tablet; Take 1 tablet (100 mg total) by mouth 2 (two) times daily.  Primary osteoarthritis of both knees -     AMB referral to sports medicine  Frequency -     Urinalysis Dipstick -     Urine culture  Blood in stool -     Hemoccult - 1 Card (office)   1. Diabetes mellitus type 2 in obese (HCC)  - POCT glucose (manual entry)   Meds ordered this encounter  Medications  . doxycycline (VIBRA-TABS) 100 MG tablet    Sig: Take 1 tablet (100 mg total) by mouth 2 (two) times daily.    Dispense:  20 tablet    Refill:  0    Follow-up: No Follow-up on file.    Boykin Nearing MD

## 2015-12-17 NOTE — Assessment & Plan Note (Signed)
A: improved  Med: compliant P: Continue current regimen

## 2015-12-17 NOTE — Progress Notes (Signed)
Pt still has cough, and pt had bleeding while using the bathroom this morning.

## 2015-12-19 ENCOUNTER — Encounter: Payer: Self-pay | Admitting: Pulmonary Disease

## 2015-12-19 ENCOUNTER — Ambulatory Visit (INDEPENDENT_AMBULATORY_CARE_PROVIDER_SITE_OTHER): Payer: No Typology Code available for payment source | Admitting: Pulmonary Disease

## 2015-12-19 VITALS — BP 108/64 | HR 75 | Ht 64.0 in | Wt 259.8 lb

## 2015-12-19 DIAGNOSIS — R05 Cough: Secondary | ICD-10-CM

## 2015-12-19 DIAGNOSIS — R059 Cough, unspecified: Secondary | ICD-10-CM

## 2015-12-19 LAB — URINE CULTURE

## 2015-12-19 MED ORDER — FLUTICASONE PROPIONATE 50 MCG/ACT NA SUSP: 1.0000 | Freq: Two times a day (BID) | NASAL | 2 refills | Status: DC

## 2015-12-19 MED ORDER — FEXOFENADINE HCL 180 MG PO TABS: 180.0000 mg | ORAL_TABLET | Freq: Every day | ORAL | 3 refills | Status: DC

## 2015-12-19 MED ORDER — FLUTICASONE-SALMETEROL 250-50 MCG/DOSE IN AEPB: 1.0000 | INHALATION_SPRAY | Freq: Two times a day (BID) | RESPIRATORY_TRACT | 5 refills | Status: DC

## 2015-12-19 MED FILL — FLUTICASONE PROP 50 MCG SPR: 50 | 30 days supply | Qty: 16 | Fill #0
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
## 2015-12-19 NOTE — Patient Instructions (Addendum)
Stop taking the cetirizine [Zyrtec]. It may be making you more sleepy. Use over-the-counter Allegra instead. We'll start Flonase nasal spray. Increase your Prilosec dose to 20 mg twice daily.  Follow-up with Dr. Ashok Cordia in the pulmonary clinic.

## 2015-12-25 ENCOUNTER — Encounter: Payer: Self-pay | Admitting: Sports Medicine

## 2015-12-25 ENCOUNTER — Ambulatory Visit (INDEPENDENT_AMBULATORY_CARE_PROVIDER_SITE_OTHER): Payer: Self-pay | Admitting: Sports Medicine

## 2015-12-25 VITALS — BP 124/68 | Ht 64.0 in | Wt 259.0 lb

## 2015-12-25 DIAGNOSIS — R5383 Other fatigue: Secondary | ICD-10-CM

## 2015-12-25 DIAGNOSIS — M79604 Pain in right leg: Secondary | ICD-10-CM | POA: Insufficient documentation

## 2015-12-25 DIAGNOSIS — M1711 Unilateral primary osteoarthritis, right knee: Secondary | ICD-10-CM

## 2015-12-25 MED ORDER — METHYLPREDNISOLONE ACETATE 40 MG/ML IJ SUSP
40.0000 mg | Freq: Once | INTRAMUSCULAR | Status: AC
  Administered 2015-12-25: 40 mg via INTRA_ARTICULAR

## 2015-12-25 NOTE — Assessment & Plan Note (Signed)
Patient noted to be very tired in clinic this morning, intermittently falling asleep during the exam. She states she feels "woozy". She took all of her medications this morning, and only ate a small amount of yogurt. Blood pressure is 124/68. Unable to check blood sugar in our clinic. Patient was given a small amount of Pepsi as well as some peanut butter crackers, and felt better after consuming these. - Patient advised to follow-up with PCP seen in for follow-up of her hypertension and diabetes.
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
## 2015-12-25 NOTE — Assessment & Plan Note (Signed)
Likely a worsening of underlying knee osteoarthritis. Review of bilateral knee x-rays performed on 06/18/2015 show narrowing of the medial joint lines, consistent with mild osteoarthritis of the knee. Her right leg is significantly more swollen than her left leg, but patient states has been going on for the last 20 years. No warmth of the calf on the right and Homan's sign is negative. - Patient given a cortisone injection into the right knee today (see procedure note below) - Patient can continue using Tylenol #3 as needed for pain. - She should follow up as needed

## 2015-12-27 ENCOUNTER — Ambulatory Visit: Payer: No Typology Code available for payment source | Attending: Family Medicine

## 2015-12-31 MED FILL — ?METFORMIN HCL 850 MG TABLE: 850 | 30 days supply | Qty: 30 | Fill #3

## 2015-12-31 MED FILL — ?LOSARTAN POTASSIUM 100 MG: 100 | 30 days supply | Qty: 30 | Fill #1

## 2016-01-04 MED FILL — hydrALAZINE HCL 25 MG TABS: 25 | 30 days supply | Qty: 90 | Fill #1

## 2016-01-04 MED FILL — ?SPIRONOLACTONE 50 MG TABLE: 50 | 30 days supply | Qty: 30 | Fill #0

## 2016-01-04 MED FILL — ?CLONIDINE HCL 0.2 MG TABLE: 0.2 | 30 days supply | Qty: 90 | Fill #1

## 2016-01-16 ENCOUNTER — Telehealth: Payer: Self-pay | Admitting: Family Medicine

## 2016-01-16 DIAGNOSIS — M25561 Pain in right knee: Secondary | ICD-10-CM

## 2016-01-16 DIAGNOSIS — M25562 Pain in left knee: Principal | ICD-10-CM

## 2016-01-16 NOTE — Telephone Encounter (Signed)
Patient called the office to speak with nurse regarding the consistent cough/ respiratory problems. Patient was taking doxycycline (VIBRA-TABS) 100 MG tablet, and finished it but it didn't help. The cough was so bad that it give her head pain. Please follow up.   Thank you

## 2016-01-17 NOTE — Telephone Encounter (Signed)
Med refilled.

## 2016-01-17 NOTE — Telephone Encounter (Signed)
Patient has been evaluated by pulmonology for her cough and has been diagnosed with chronic cough, upper airway cough syndrome.   She is advised to continue the pulmonologist recommendations:  Plan/Recommendations: - Change zyrtec to allegra - Start flonase nasal spray - Increase prilosec dose to 20 mg bid. - Continue claritin.   And come in tomorrow to pick up a refill of tylenol #3 which is for both pain and chronic cough.

## 2016-01-18 MED ORDER — ACETAMINOPHEN-CODEINE #3 300-30 MG PO TABS: 1.0000 | ORAL_TABLET | Freq: Three times a day (TID) | ORAL | 0 refills | Status: DC | PRN

## 2016-01-18 NOTE — Telephone Encounter (Signed)
Pt was called and informed of script being ready. 

## 2016-01-18 NOTE — Telephone Encounter (Signed)
Tylenol #3 refilled See previous note for instructions regarding cough

## 2016-01-25 MED FILL — ?CLONIDINE HCL 0.2 MG TABLE: 0.2 | 30 days supply | Qty: 90 | Fill #2

## 2016-01-25 MED FILL — metFORMIN HCL 850 MG TABS: 850 | 30 days supply | Qty: 30 | Fill #4

## 2016-01-25 MED FILL — hydrALAZINE HCL 25 MG TABS: 25 | 30 days supply | Qty: 90 | Fill #2

## 2016-01-25 MED FILL — ?SPIRONOLACTONE 50 MG TABLE: 50 | 30 days supply | Qty: 30 | Fill #1

## 2016-01-25 MED FILL — LOSARTAN POTASSIUM 100 MG T: 100 | 30 days supply | Qty: 30 | Fill #2

## 2016-01-29 MED FILL — ?CETIRIZINE HCL 10 MG TABLE: 10 | 30 days supply | Qty: 30 | Fill #3

## 2016-01-29 MED FILL — OXYBUTYNIN 5 MG TABLET: 5 | 30 days supply | Qty: 60 | Fill #3

## 2016-01-29 MED FILL — raNITIdine HCL 150 MG TABS: 150 | 30 days supply | Qty: 30 | Fill #2

## 2016-02-14 ENCOUNTER — Encounter: Payer: Self-pay | Admitting: Family Medicine

## 2016-03-03 MED FILL — OXYBUTYNIN 5 MG TABLET: 5 | 30 days supply | Qty: 60 | Fill #4

## 2016-03-04 ENCOUNTER — Ambulatory Visit: Payer: Self-pay | Attending: Family Medicine | Admitting: Family Medicine

## 2016-03-04 ENCOUNTER — Encounter: Payer: Self-pay | Admitting: Family Medicine

## 2016-03-04 VITALS — BP 138/85 | HR 56 | Temp 98.1°F | Ht 64.0 in | Wt 264.0 lb

## 2016-03-04 DIAGNOSIS — Z7982 Long term (current) use of aspirin: Secondary | ICD-10-CM | POA: Insufficient documentation

## 2016-03-04 DIAGNOSIS — Z7984 Long term (current) use of oral hypoglycemic drugs: Secondary | ICD-10-CM | POA: Insufficient documentation

## 2016-03-04 DIAGNOSIS — E119 Type 2 diabetes mellitus without complications: Secondary | ICD-10-CM | POA: Insufficient documentation

## 2016-03-04 DIAGNOSIS — R059 Cough, unspecified: Secondary | ICD-10-CM

## 2016-03-04 DIAGNOSIS — K219 Gastro-esophageal reflux disease without esophagitis: Secondary | ICD-10-CM | POA: Insufficient documentation

## 2016-03-04 DIAGNOSIS — E1169 Type 2 diabetes mellitus with other specified complication: Secondary | ICD-10-CM

## 2016-03-04 DIAGNOSIS — N393 Stress incontinence (female) (male): Secondary | ICD-10-CM | POA: Insufficient documentation

## 2016-03-04 DIAGNOSIS — Z79899 Other long term (current) drug therapy: Secondary | ICD-10-CM | POA: Insufficient documentation

## 2016-03-04 DIAGNOSIS — R51 Headache: Secondary | ICD-10-CM | POA: Insufficient documentation

## 2016-03-04 DIAGNOSIS — E669 Obesity, unspecified: Secondary | ICD-10-CM | POA: Insufficient documentation

## 2016-03-04 DIAGNOSIS — Z9119 Patient's noncompliance with other medical treatment and regimen: Secondary | ICD-10-CM | POA: Insufficient documentation

## 2016-03-04 DIAGNOSIS — R079 Chest pain, unspecified: Secondary | ICD-10-CM | POA: Insufficient documentation

## 2016-03-04 DIAGNOSIS — R05 Cough: Secondary | ICD-10-CM | POA: Insufficient documentation

## 2016-03-04 DIAGNOSIS — Z7722 Contact with and (suspected) exposure to environmental tobacco smoke (acute) (chronic): Secondary | ICD-10-CM | POA: Insufficient documentation

## 2016-03-04 LAB — GLUCOSE, POCT (MANUAL RESULT ENTRY): POC GLUCOSE: 96 mg/dL (ref 70–99)

## 2016-03-04 MED ORDER — OXYBUTYNIN CHLORIDE 5 MG PO TABS: 5.0000 mg | ORAL_TABLET | Freq: Two times a day (BID) | ORAL | 11 refills | Status: DC

## 2016-03-04 MED ORDER — GUAIFENESIN-CODEINE 100-10 MG/5ML PO SOLN: 5.0000 mL | Freq: Three times a day (TID) | ORAL | 5 refills | Status: DC | PRN

## 2016-03-04 MED ORDER — OMEPRAZOLE 20 MG PO CPDR: 20.0000 mg | DELAYED_RELEASE_CAPSULE | Freq: Two times a day (BID) | ORAL | 5 refills | Status: DC

## 2016-03-04 NOTE — Progress Notes (Signed)
Pt is here today for a cough.

## 2016-03-04 NOTE — Progress Notes (Signed)
Subjective:  Patient ID: Sabrina [name], female    DOB: 02/17/1961  Age: 55 y.o. MRN: [id number]  CC: Cough   HPI Sabrina [name] presents for    1. Chronic cough: x 1 year. With headache. Intermittently productive. No SOB. Tickle in throat. Some chest pains. She has had 3 negative chest x-rays in the past year. She was referred to pulmonology. She has normal PFTs in 06/2015. She also had a CT angiogram on 10/2015 that was negative for PE and revealed negative lung parenchyma. She denies reflux. 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  She admit that she has not been compliant with her regimen for chronic cough prescribed by her pulmonologist:  - allegra  - flonase nasal spray  - prilosec dose to 20 mg bid.  - Continue claritin.  She request refill of guaifenesin-codeine syrup. She worries that there is a problem with her heart or lungs. She is concerned about CHF    Social History  Substance Use Topics  . Smoking status: Passive Smoke Exposure - Never Smoker  . Smokeless tobacco: Never Used     Comment: Mother & Grandfather.  . Alcohol use No    Outpatient Medications Prior to Visit  Medication Sig Dispense Refill  . acetaminophen (TYLENOL) 325 MG tablet Take 650 mg by mouth every 6 (six) hours as needed for mild pain.    Marland Kitchen acetaminophen-codeine (TYLENOL #3) 300-30 MG tablet Take 1 tablet by mouth every 8 (eight) hours as needed for moderate pain (and cough). 60 tablet 0  . aspirin EC 81 MG tablet Take 1 tablet (81 mg total) by mouth daily. (Patient taking differently: Take 81 mg by mouth every morning. )    . cloNIDine (CATAPRES) 0.2 MG tablet Take 1 tablet (0.2 mg total) by mouth 3 (three) times daily. 90 tablet 5  . divalproex (DEPAKOTE ER) 250 MG 24 hr tablet Take 750 mg by mouth at bedtime.    . fexofenadine (ALLEGRA ALLERGY) 180 MG tablet Take 1 tablet (180 mg total) by mouth daily. 30 tablet 3  . fluticasone (FLONASE) 50 MCG/ACT  nasal spray Place 1 spray into both nostrils 2 (two) times daily. 16 g 2  . hydrALAZINE (APRESOLINE) 25 MG tablet Take 1 tablet (25 mg total) by mouth every 8 (eight) hours. 90 tablet 5  . ibuprofen (ADVIL,MOTRIN) 200 MG tablet Take 200 mg by mouth every 6 (six) hours as needed (pain).    Marland Kitchen loratadine (CLARITIN) 10 MG tablet Take 1 tablet (10 mg total) by mouth daily. 30 tablet 11  . metFORMIN (GLUCOPHAGE) 850 MG tablet Take 1 tablet (850 mg total) by mouth every morning. 30 tablet 5  . nitroGLYCERIN (NITROSTAT) 0.4 MG SL tablet Place 1 tablet (0.4 mg total) under the tongue every 5 (five) minutes as needed for chest pain. 25 tablet 3  . nystatin (NYSTATIN) powder Apply topically 3 (three) times daily. Apply under breast to treat skin yeast infection (Patient taking differently: Apply topically 3 (three) times daily as needed (itching/ apply under breast to treat skin yeast infection). ) 60 g 0  . omeprazole (PRILOSEC) 20 MG capsule Take 1 capsule (20 mg total) by mouth 2 (two) times daily before a meal. (Patient taking differently: Take 20 mg by mouth daily. ) 60 capsule 5  . oxybutynin (DITROPAN) 5 MG tablet TAKE 1 TABLET BY MOUTH TWICE DAILY. (Patient taking differently: TAKE 1 TABLET (5 mg) BY MOUTH TWICE DAILY.) 60  tablet 3  . spironolactone (ALDACTONE) 50 MG tablet Take 1 tablet (50 mg total) by mouth daily. 30 tablet 5  . traZODone (DESYREL) 50 MG tablet Take 25-50 mg by mouth daily as needed for sleep.      No facility-administered medications prior to visit.     ROS Review of Systems  Constitutional: Negative for chills and fever.  Eyes: Negative for visual disturbance.  Respiratory: Positive for cough. Negative for shortness of breath.   Cardiovascular: Negative for chest pain.  Gastrointestinal: Positive for abdominal pain, blood in stool, constipation and nausea. Negative for abdominal distention, diarrhea and vomiting.  Genitourinary: Positive for frequency.  Musculoskeletal:  Positive for arthralgias. Negative for back pain.  Skin: Negative for rash.  Allergic/Immunologic: Negative for immunocompromised state.  Hematological: Negative for adenopathy. Does not bruise/bleed easily.  Psychiatric/Behavioral: Negative for dysphoric mood and suicidal ideas.    Objective:  BP 138/85 (BP Location: Left Arm, Patient Position: Sitting, Cuff Size: Small)   Pulse (!) 56   Temp 98.1 F (36.7 C) (Oral)   Ht 5\' 4"  (1.626 m)   Wt 264 lb (119.7 kg)   SpO2 97%   BMI 45.32 kg/m   BP/Weight 03/04/2016 12/25/2015 [id number]  Systolic BP 0000000 [id number]  Diastolic BP 85 68 64  Wt. (Lbs) 264 259 259.8  BMI 45.32 44.46 44.59   Wt Readings from Last 3 Encounters:  03/04/16 264 lb (119.7 kg)  12/25/15 259 lb (117.5 kg)  12/19/15 259 lb 12.8 oz (117.8 kg)    Physical Exam  Constitutional: She is oriented to person, place, and time. She appears well-developed and well-nourished. No distress.  HENT:  Head: Normocephalic and atraumatic.  Cardiovascular: Normal rate, regular rhythm, normal heart sounds and intact distal pulses.   Pulmonary/Chest: Effort normal and breath sounds normal.  Genitourinary: Rectum normal. Rectal exam shows no external hemorrhoid, no internal hemorrhoid, no fissure, no mass, no tenderness, anal tone normal and guaiac negative stool.  Musculoskeletal: She exhibits edema (trace LE edema ).  Neurological: She is alert and oriented to person, place, and time.  Skin: Skin is warm and dry. No rash noted.  Psychiatric: She has a normal mood and affect.   Lab Results  Component Value Date   HGBA1C 5.6 10/11/2015     Assessment & Plan:  Sabrina [name] was seen today for cough.  Diagnoses and all orders for this visit:  Cough -     Brain natriuretic peptide -     Cancel: DG Chest 2 View; Future -     guaiFENesin-codeine 100-10 MG/5ML syrup; Take 5 mLs by mouth 3 (three) times daily as needed for cough. -     CT Maxillofacial WO CM; Future  Diabetes mellitus  type 2 in obese (HCC) -     POCT glucose (manual entry)  Gastroesophageal reflux disease, esophagitis presence not specified -     omeprazole (PRILOSEC) 20 MG capsule; Take 1 capsule (20 mg total) by mouth 2 (two) times daily before a meal.  Stress incontinence -     oxybutynin (DITROPAN) 5 MG tablet; Take 1 tablet (5 mg total) by mouth 2 (two) times daily.    No orders of the defined types were placed in this encounter.   Follow-up: Return in about 6 weeks (around 04/15/2016) for chronic cough .   Boykin Nearing MD

## 2016-03-04 NOTE — Patient Instructions (Addendum)
Sabrina [name] was seen today for cough.  Diagnoses and all orders for this visit:  Diabetes mellitus type 2 in obese ([place]) -     POCT glucose (manual entry)  Cough -     Brain natriuretic peptide -     DG Chest 2 View; Future -     guaiFENesin-codeine 100-10 MG/5ML syrup; Take 5 mLs by mouth 3 (three) times daily as needed for cough.  Gastroesophageal reflux disease, esophagitis presence not specified -     omeprazole (PRILOSEC) 20 MG capsule; Take 1 capsule (20 mg total) by mouth 2 (two) times daily before a meal.  Other orders -     oxybutynin (DITROPAN) 5 MG tablet; Take 1 tablet (5 mg total) by mouth 2 (two) times daily.   F/u in 6 weeks for chronic cough   Dr. Adrian Blackwater

## 2016-03-05 DIAGNOSIS — N393 Stress incontinence (female) (male): Secondary | ICD-10-CM | POA: Insufficient documentation

## 2016-03-05 LAB — BRAIN NATRIURETIC PEPTIDE: Brain Natriuretic Peptide: 12 pg/mL (ref <100)

## 2016-03-05 NOTE — Assessment & Plan Note (Signed)
Chronic cough W/u unrevealing  Plan:  BNP normal Patient reassured that he does not have CHF Refilled guaifenesin-codeine syrup for symptomatic control CT sinus ordered to evaluate for chronic sinusitis

## 2016-03-11 ENCOUNTER — Ambulatory Visit: Payer: Self-pay | Admitting: Family Medicine

## 2016-03-13 ENCOUNTER — Ambulatory Visit (HOSPITAL_COMMUNITY): Payer: Self-pay

## 2016-03-14 MED FILL — OMEPRAZOLE DR 20 MG CAPSULE: 20 | 30 days supply | Qty: 60 | Fill #0

## 2016-03-17 MED FILL — cloNIDine HCL 0.2 MG TABS: 0.2 | 30 days supply | Qty: 90 | Fill #3

## 2016-03-20 ENCOUNTER — Other Ambulatory Visit: Payer: Self-pay | Admitting: Internal Medicine

## 2016-03-20 ENCOUNTER — Encounter: Payer: Self-pay | Admitting: Pulmonary Disease

## 2016-03-20 ENCOUNTER — Ambulatory Visit (INDEPENDENT_AMBULATORY_CARE_PROVIDER_SITE_OTHER): Payer: Self-pay | Admitting: Pulmonary Disease

## 2016-03-20 ENCOUNTER — Telehealth: Payer: Self-pay | Admitting: Pulmonary Disease

## 2016-03-20 VITALS — BP 116/72 | HR 68 | Ht 64.0 in | Wt 264.0 lb

## 2016-03-20 DIAGNOSIS — R05 Cough: Secondary | ICD-10-CM

## 2016-03-20 DIAGNOSIS — R059 Cough, unspecified: Secondary | ICD-10-CM

## 2016-03-20 DIAGNOSIS — J309 Allergic rhinitis, unspecified: Secondary | ICD-10-CM

## 2016-03-20 MED ORDER — FLUTICASONE PROPIONATE 50 MCG/ACT NA SUSP: 1.0000 | Freq: Two times a day (BID) | NASAL | 3 refills | Status: DC

## 2016-03-20 MED FILL — FLUTICASONE PROP 50 MCG SPR: 50 | 30 days supply | Qty: 16 | Fill #0

## 2016-03-20 MED FILL — ?CETIRIZINE HCL 10 MG TABLE: 10 | 30 days supply | Qty: 30 | Fill #4
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
## 2016-03-20 NOTE — Patient Instructions (Signed)
   Remember to have the CT scan of your sinuses done.  Use your Flonase 1 spray in each nostril twice daily. Remember to angle it outward so it doesn't shoot down your throat.  Call me if your cough isn't getting better in the next couple of weeks.  I will see you back in 6 weeks.

## 2016-03-20 NOTE — Telephone Encounter (Signed)
JN  Pt. Stated when she was in the office to see you today she asked if she could have a refill of her guaifensin-codeine. Also is requesting acetaminophen-codeine, because she states she coughs so bad and she feels pain  Instructions      Return in about 6 weeks (around 05/01/2016).   Remember to have the CT scan of your sinuses done.  Use your Flonase 1 spray in each nostril twice daily. Remember to angle it outward so it doesn't shoot down your throat.  Call me if your cough isn't getting better in the next couple of weeks.  I will see you back in 6 weeks.

## 2016-03-20 NOTE — Telephone Encounter (Signed)
The patient saw Dr. Adrian Blackwater on 12/5 and supposedly had this prescription sent in. She told me that the pharmacy told her it was something she could get over-the-counter at the [facility]. Obviously that is not the case with Codeine. I told her to go back to the pharmacy to pick up the prescription. If it was not sent in then she needs to contact Dr. Adrian Blackwater' office to address this so that a double prescription doesn't land in the pharmacy:  One from [place] and one from her. Thanks.

## 2016-03-21 NOTE — Telephone Encounter (Signed)
Called pt. And informed her of JN message, pt. Keeps insisting that the rx is not at the pharmacy. [facility], they stated it is in fact there and she needs to come pick it up, they stated they spoke with her yesterday and informed her of this. I called the pt. And informed her that it is ready at their office to be picked up. She said she would go there and get it nothing further is needed at this time.

## 2016-03-26 NOTE — Congregational Nurse Program (Signed)
Congregational Nurse Program Note  Date of Encounter: 03/17/2016  Past Medical History: Past Medical History:  Diagnosis Date  . Anxiety   . Arthritis    "legs, knees, hands" (11/16/2014)  . Bipolar disorder ([place])    2 breakdowns - 1998, 2000 had to be hospitalized, followed at [facility]  . Chest pain    a. Myoview 6/16:  anterior and apical ischemia, EF 55-65%;  b. LHC 8/16:  no CAD, Normal EF  . Chest pain 10/2015  . Chronic bronchitis ([place])    "get it q yr"  . Chronic lower back pain   . Depression   . GERD (gastroesophageal reflux disease)   . Gout   . History of echocardiogram    a. Echo 12/15:  Mild LVH, EF 55-60%, mild LAE, PASP 36 mmHg  . Hyperlipidemia LDL goal < 100    "not on RX" (11/15/2014)  . Hypertension   . Migraine    "monthly" (11/16/2014)  . Mixed restrictive and obstructive lung disease ([place])    Health serve chart suggests PFTs done 1/10  . Morbid obesity with BMI of 40.0-44.9, adult ([place])   . Rheumatoid arthritis [facility])    Health serve records indicate Rheumatoid  . Seizures ([place])    "might have had 1; I'm on depakote" (11/16/2014)  . Type II diabetes mellitus ([place])     Encounter Details:     CNP Questionnaire - 03/17/16 1147      Patient Demographics   Is this a new or existing patient? New   Patient is considered a/an Not Applicable   Race African-American/Black     Patient Assistance   Location of Patient Assistance Not Applicable   Patient's financial/insurance status Low Income;Medicaid   Patient referred to apply for the following financial assistance Not Applicable   Food insecurities addressed Not Applicable   Transportation assistance No   Assistance securing medications No   Educational health offerings Safety     Encounter Details   Primary purpose of visit Chronic Illness/Condition Visit   Was an Emergency Department visit averted? Yes   Does patient have a medical provider? Yes   Patient referred to Establish PCP   Was a mental  health screening completed? (GAINS tool) No   Does patient have dental issues? No   Does patient have vision issues? No   Does your patient have an abnormal blood pressure today? No   Since previous encounter, have you referred patient for abnormal blood pressure that resulted in a new diagnosis or medication change? No   Does your patient have an abnormal blood glucose today? No   Since previous encounter, have you referred patient for abnormal blood glucose that resulted in a new diagnosis or medication change? No   Was there a life-saving intervention made? No         Amb Nursing Assessment - 03/04/16 1100      Pre-visit preparation   Pre-visit preparation completed Yes     Pain Assessment   Pain Assessment No/denies pain     Nutrition Screen   Diabetes Yes   CBG done? Yes   CBG resulted in Enter/ Edit results? Yes   Did pt. bring in CBG monitor from home? No     Functional Status   Activities of Daily Living Independent   Ambulation Independent   Medication Administration Independent   Home Management Independent     Risk/Barriers  Assessment   Barriers to Care Management & Learning None  Abuse/Neglect Assessment   Do you feel unsafe in your current relationship? No   Do you feel physically threatened by others? No   Anyone hurting you at home, work, or school? No   Unable to ask? No     Investment banker, operational Needed? No     While volunteering at the [employer], experienced an episode where she was having difficulty breathing.  States she believes she was having a panic attack.  Was able to resume normal breathing after a few minutes of relaxation and intentional normal breathing.  States has hx of anxiety and did not know what triggered this episode.

## 2016-03-27 ENCOUNTER — Other Ambulatory Visit: Payer: Self-pay | Admitting: Family Medicine

## 2016-03-27 DIAGNOSIS — R05 Cough: Secondary | ICD-10-CM

## 2016-03-27 DIAGNOSIS — R059 Cough, unspecified: Secondary | ICD-10-CM

## 2016-03-27 MED ORDER — GUAIFENESIN-CODEINE 100-10 MG/5ML PO SOLN: 5.0000 mL | Freq: Three times a day (TID) | ORAL | 5 refills | Status: DC | PRN

## 2016-03-27 MED FILL — LOSARTAN POTASSIUM 100 MG T: 100 | 30 days supply | Qty: 30 | Fill #3

## 2016-03-27 MED FILL — ?SPIRONOLACTONE 50 MG TABLE: 50 | 30 days supply | Qty: 30 | Fill #2

## 2016-03-27 MED FILL — GUAIFENESIN AC COUGH SYRUP: 100-10 | 8 days supply | Qty: 120 | Fill #0

## 2016-03-27 MED FILL — ?METFORMIN HCL 850 MG TABLE: 850 | 30 days supply | Qty: 30 | Fill #5

## 2016-03-30 ENCOUNTER — Encounter: Payer: Self-pay | Admitting: Family Medicine

## 2016-03-30 DIAGNOSIS — R05 Cough: Secondary | ICD-10-CM

## 2016-03-30 DIAGNOSIS — R053 Chronic cough: Secondary | ICD-10-CM

## 2016-04-23 MED FILL — cloNIDine HCL 0.2 MG TABS: 0.2 | 30 days supply | Qty: 90 | Fill #4

## 2016-04-28 MED FILL — GUAIFENESIN AC COUGH SYRUP: 100-10 | 8 days supply | Qty: 120 | Fill #1

## 2016-05-01 MED FILL — OXYBUTYNIN 5 MG TABLET: 5 | 30 days supply | Qty: 60 | Fill #0

## 2016-05-05 ENCOUNTER — Encounter: Payer: Self-pay | Admitting: Pulmonary Disease

## 2016-05-05 ENCOUNTER — Ambulatory Visit (INDEPENDENT_AMBULATORY_CARE_PROVIDER_SITE_OTHER): Payer: Self-pay | Admitting: Pulmonary Disease

## 2016-05-05 VITALS — BP 120/90 | HR 69 | Ht 64.0 in | Wt 264.0 lb

## 2016-05-05 DIAGNOSIS — J309 Allergic rhinitis, unspecified: Secondary | ICD-10-CM

## 2016-05-05 DIAGNOSIS — G4733 Obstructive sleep apnea (adult) (pediatric): Secondary | ICD-10-CM

## 2016-05-05 DIAGNOSIS — R0600 Dyspnea, unspecified: Secondary | ICD-10-CM

## 2016-05-05 DIAGNOSIS — R0683 Snoring: Secondary | ICD-10-CM | POA: Insufficient documentation

## 2016-05-05 DIAGNOSIS — R05 Cough: Secondary | ICD-10-CM

## 2016-05-05 DIAGNOSIS — R059 Cough, unspecified: Secondary | ICD-10-CM
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
## 2016-05-05 NOTE — Patient Instructions (Signed)
   We will refer you to an ENT doctor to help address your cough.  Call me if you have any new questions or concerns.  I will see you back in 6 months or sooner if needed.

## 2016-05-08 ENCOUNTER — Encounter: Payer: Self-pay | Admitting: Family Medicine

## 2016-05-08 ENCOUNTER — Ambulatory Visit: Payer: Self-pay | Attending: Family Medicine | Admitting: Family Medicine

## 2016-05-08 VITALS — BP 135/84 | HR 59 | Temp 97.8°F | Ht 64.0 in | Wt 268.2 lb

## 2016-05-08 DIAGNOSIS — R05 Cough: Secondary | ICD-10-CM

## 2016-05-08 DIAGNOSIS — Z7984 Long term (current) use of oral hypoglycemic drugs: Secondary | ICD-10-CM | POA: Insufficient documentation

## 2016-05-08 DIAGNOSIS — Z7722 Contact with and (suspected) exposure to environmental tobacco smoke (acute) (chronic): Secondary | ICD-10-CM | POA: Insufficient documentation

## 2016-05-08 DIAGNOSIS — Z6841 Body Mass Index (BMI) 40.0 and over, adult: Secondary | ICD-10-CM | POA: Insufficient documentation

## 2016-05-08 DIAGNOSIS — M545 Low back pain, unspecified: Secondary | ICD-10-CM

## 2016-05-08 DIAGNOSIS — I10 Essential (primary) hypertension: Secondary | ICD-10-CM

## 2016-05-08 DIAGNOSIS — R51 Headache: Secondary | ICD-10-CM | POA: Insufficient documentation

## 2016-05-08 DIAGNOSIS — G8929 Other chronic pain: Secondary | ICD-10-CM

## 2016-05-08 DIAGNOSIS — Z7982 Long term (current) use of aspirin: Secondary | ICD-10-CM | POA: Insufficient documentation

## 2016-05-08 DIAGNOSIS — R059 Cough, unspecified: Secondary | ICD-10-CM

## 2016-05-08 DIAGNOSIS — Z79899 Other long term (current) drug therapy: Secondary | ICD-10-CM | POA: Insufficient documentation

## 2016-05-08 DIAGNOSIS — E119 Type 2 diabetes mellitus without complications: Secondary | ICD-10-CM | POA: Insufficient documentation

## 2016-05-08 DIAGNOSIS — R52 Pain, unspecified: Secondary | ICD-10-CM

## 2016-05-08 DIAGNOSIS — E1169 Type 2 diabetes mellitus with other specified complication: Secondary | ICD-10-CM

## 2016-05-08 DIAGNOSIS — E669 Obesity, unspecified: Secondary | ICD-10-CM

## 2016-05-08 DIAGNOSIS — Z9119 Patient's noncompliance with other medical treatment and regimen: Secondary | ICD-10-CM | POA: Insufficient documentation

## 2016-05-08 LAB — POCT URINALYSIS DIPSTICK
Bilirubin, UA: NEGATIVE
Glucose, UA: NEGATIVE
Ketones, UA: NEGATIVE
Leukocytes, UA: NEGATIVE
Nitrite, UA: NEGATIVE
PH UA: 6
PROTEIN UA: NEGATIVE
RBC UA: NEGATIVE
SPEC GRAV UA: 1.02
UROBILINOGEN UA: 0.2

## 2016-05-08 LAB — GLUCOSE, POCT (MANUAL RESULT ENTRY): POC Glucose: 83 mg/dl (ref 70–99)

## 2016-05-08 LAB — POCT GLYCOSYLATED HEMOGLOBIN (HGB A1C): Hemoglobin A1C: 5.6

## 2016-05-08 MED ORDER — SPIRONOLACTONE 50 MG PO TABS: 50.0000 mg | ORAL_TABLET | Freq: Every day | ORAL | 5 refills | Status: DC

## 2016-05-08 MED ORDER — GUAIFENESIN-CODEINE 100-10 MG/5ML PO SOLN: 5.0000 mL | Freq: Three times a day (TID) | ORAL | 5 refills | Status: DC | PRN

## 2016-05-08 MED ORDER — LORATADINE 10 MG PO TABS: 10.0000 mg | ORAL_TABLET | Freq: Every day | ORAL | 11 refills | Status: DC

## 2016-05-08 NOTE — Patient Instructions (Addendum)
Diagnoses and all orders for this visit:  Diabetes mellitus type 2 in obese ([place]) -     POCT glucose (manual entry) -     POCT glycosylated hemoglobin (Hb A1C)  Cough -     guaiFENesin-codeine 100-10 MG/5ML syrup; Take 5 mLs by mouth 3 (three) times daily as needed for cough. -     loratadine (CLARITIN) 10 MG tablet; Take 1 tablet (10 mg total) by mouth daily.  Chronic bilateral low back pain without sciatica -     POCT urinalysis dipstick  Essential hypertension -     spironolactone (ALDACTONE) 50 MG tablet; Take 1 tablet (50 mg total) by mouth daily.  add scheduled tylenol 650 mg three times a day  F/u in  3 months for HTN  Dr. Adrian Blackwater

## 2016-05-08 NOTE — Progress Notes (Signed)
Subjective:  Patient ID: Sabrina [name], female    DOB: 04-06-1960  Age: 56 y.o. MRN: [id number]  CC: No chief complaint on file.   HPI Sabrina [name] presents for    1. Chronic cough: x 1 year. With headache. Intermittently productive. No SOB. Tickle in throat. Some chest pains. She has had 3 negative chest x-rays in the past year. She was referred to pulmonology. She has normal PFTs in 06/2015. She also had a CT angiogram on 10/2015 that was negative for PE and revealed negative lung parenchyma. She denies reflux. 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  She admit that she has not been compliant with her regimen for chronic cough prescribed by her pulmonologist:  - allegra  - flonase nasal spray  - prilosec dose to 20 mg bid.  - Continue claritin.   2. Body pains: pain in low back, feet. No dysuria, hematuria. No fever or chills. No rash or skin changes. No trauma. paiten is obese and admits to not exercising.   3. HTN: compliant with regimen. She has been out of aldactone for 3 days. Has HA. No CP or SOB. Has leg swelling.  4. DM2: compliant with metformin. Eats low sugar most of the time. Not exercising.   Social History  Substance Use Topics  . Smoking status: Passive Smoke Exposure - Never Smoker  . Smokeless tobacco: Never Used     Comment: Mother & Grandfather.  . Alcohol use No    Outpatient Medications Prior to Visit  Medication Sig Dispense Refill  . aspirin EC 81 MG tablet Take 1 tablet (81 mg total) by mouth daily. (Patient taking differently: Take 81 mg by mouth every morning. )    . cloNIDine (CATAPRES) 0.2 MG tablet Take 1 tablet (0.2 mg total) by mouth 3 (three) times daily. 90 tablet 5  . divalproex (DEPAKOTE ER) 250 MG 24 hr tablet Take 750 mg by mouth at bedtime.    . fexofenadine (ALLEGRA ALLERGY) 180 MG tablet Take 1 tablet (180 mg total) by mouth daily. 30 tablet 3  . fluticasone (FLONASE) 50 MCG/ACT nasal spray  Place 1 spray into both nostrils 2 (two) times daily. 16 g 3  . hydrALAZINE (APRESOLINE) 25 MG tablet Take 1 tablet (25 mg total) by mouth every 8 (eight) hours. 90 tablet 5  . ibuprofen (ADVIL,MOTRIN) 200 MG tablet Take 200 mg by mouth every 6 (six) hours as needed (pain).    Marland Kitchen losartan (COZAAR) 100 MG tablet Take 1 tablet by mouth daily.  3  . metFORMIN (GLUCOPHAGE) 850 MG tablet Take 1 tablet (850 mg total) by mouth every morning. 30 tablet 5  . nitroGLYCERIN (NITROSTAT) 0.4 MG SL tablet Place 1 tablet (0.4 mg total) under the tongue every 5 (five) minutes as needed for chest pain. 25 tablet 3  . nystatin (NYSTATIN) powder Apply topically 3 (three) times daily. Apply under breast to treat skin yeast infection (Patient taking differently: Apply topically 3 (three) times daily as needed (itching/ apply under breast to treat skin yeast infection). ) 60 g 0  . omeprazole (PRILOSEC) 20 MG capsule Take 1 capsule (20 mg total) by mouth 2 (two) times daily before a meal. 60 capsule 5  . oxybutynin (DITROPAN) 5 MG tablet Take 1 tablet (5 mg total) by mouth 2 (two) times daily. 60 tablet 11  . ranitidine (ZANTAC) 150 MG tablet Take 1 tablet by mouth at bedtime.  3  .  spironolactone (ALDACTONE) 50 MG tablet Take 1 tablet (50 mg total) by mouth daily. 30 tablet 5  . traZODone (DESYREL) 50 MG tablet Take 25-50 mg by mouth daily as needed for sleep.     Marland Kitchen acetaminophen-codeine (TYLENOL #3) 300-30 MG tablet Take 1 tablet by mouth every 8 (eight) hours as needed for moderate pain (and cough). 60 tablet 0  . guaiFENesin-codeine 100-10 MG/5ML syrup Take 5 mLs by mouth 3 (three) times daily as needed for cough. 120 mL 5  . loratadine (CLARITIN) 10 MG tablet Take 1 tablet (10 mg total) by mouth daily. 30 tablet 11  . acetaminophen (TYLENOL) 325 MG tablet Take 650 mg by mouth every 6 (six) hours as needed for mild pain.     No facility-administered medications prior to visit.     ROS Review of Systems    Constitutional: Negative for chills and fever.  Eyes: Negative for visual disturbance.  Respiratory: Positive for cough. Negative for shortness of breath.   Cardiovascular: Negative for chest pain.  Gastrointestinal: Negative for abdominal distention, abdominal pain, blood in stool, constipation, diarrhea, nausea and vomiting.  Genitourinary: Positive for frequency.  Musculoskeletal: Positive for arthralgias and back pain.  Skin: Negative for rash.  Allergic/Immunologic: Negative for immunocompromised state.  Neurological: Positive for headaches.  Hematological: Negative for adenopathy. Does not bruise/bleed easily.  Psychiatric/Behavioral: Negative for dysphoric mood and suicidal ideas.    Objective:  BP 135/84 (BP Location: Left Arm, Patient Position: Sitting, Cuff Size: Large)   Pulse (!) 59   Temp 97.8 F (36.6 C) (Oral)   Ht 5\' 4"  (1.626 m)   Wt 268 lb 3.2 oz (121.7 kg)   SpO2 99%   BMI 46.04 kg/m   BP/Weight 05/08/2016 05/05/2016 [id number]  Systolic BP [id number]  Diastolic BP 84 90 72  Wt. (Lbs) 268.2 264 264  BMI 46.04 45.32 45.32   Wt Readings from Last 3 Encounters:  05/08/16 268 lb 3.2 oz (121.7 kg)  05/05/16 264 lb (119.7 kg)  03/20/16 264 lb (119.7 kg)    Physical Exam  Constitutional: She is oriented to person, place, and time. She appears well-developed and well-nourished. No distress.  obese  HENT:  Head: Normocephalic and atraumatic.  Cardiovascular: Normal rate, regular rhythm, normal heart sounds and intact distal pulses.   Pulmonary/Chest: Effort normal and breath sounds normal.  Genitourinary: Rectum normal. Rectal exam shows no external hemorrhoid, no internal hemorrhoid, no fissure, no mass, no tenderness, anal tone normal and guaiac negative stool.  Musculoskeletal: She exhibits edema (trace LE edema ).  Neurological: She is alert and oriented to person, place, and time.  Skin: Skin is warm and dry. No rash noted.  Psychiatric: She has a normal  mood and affect.   Lab Results  Component Value Date   HGBA1C 5.6 05/08/2016   CBG 83   UA: clear, negative  Assessment & Plan:  Sabrina [name] was seen today for cough.  Diagnoses and all orders for this visit:  Diabetes mellitus type 2 in obese ([place]) -     POCT glucose (manual entry) -     POCT glycosylated hemoglobin (Hb A1C)  Cough -     guaiFENesin-codeine 100-10 MG/5ML syrup; Take 5 mLs by mouth 3 (three) times daily as needed for cough. -     loratadine (CLARITIN) 10 MG tablet; Take 1 tablet (10 mg total) by mouth daily. -     fexofenadine (ALLEGRA ALLERGY) 180 MG tablet; Take 1 tablet (180 mg total) by  mouth daily.  Chronic bilateral low back pain without sciatica -     POCT urinalysis dipstick  Essential hypertension -     spironolactone (ALDACTONE) 50 MG tablet; Take 1 tablet (50 mg total) by mouth daily.  Body aches -     ANA,IFA RA Diag Pnl w/rflx Tit/Patn; Future -     Cyclic citrul peptide antibody, IgG; Future    No orders of the defined types were placed in this encounter.   Follow-up: Return in about 3 months (around 08/05/2016) for HTN .   Boykin Nearing MD

## 2016-05-08 NOTE — Progress Notes (Signed)
Pt is here today for back pain, foot pain, and pt states that cough has not gotten any better.

## 2016-05-09 DIAGNOSIS — R52 Pain, unspecified: Secondary | ICD-10-CM | POA: Insufficient documentation

## 2016-05-09 MED ORDER — FEXOFENADINE HCL 180 MG PO TABS: 180.0000 mg | ORAL_TABLET | Freq: Every day | ORAL | 11 refills | Status: DC

## 2016-05-09 MED FILL — GUAIFENESIN AC COUGH SYRUP: 100-10 | 32 days supply | Qty: 473 | Fill #0

## 2016-05-09 NOTE — Assessment & Plan Note (Signed)
Well-controlled.  Continue current regimen. 

## 2016-05-09 NOTE — Assessment & Plan Note (Addendum)
Body aches No joint swelling ? Rheumatoid arthritis in past with hx of treatment  Plan: Scheduled tylenol Check CPP ANA, RA panel

## 2016-05-09 NOTE — Assessment & Plan Note (Signed)
Chronic cough Patient has been referred to allergist and ENT Currently unable to afford co-pay  Plan: Continue regimen of allegra, claritin, flonase, prilosec and guaifenesin-codeine

## 2016-05-09 NOTE — Assessment & Plan Note (Signed)
Well controlled Continue current regimen Checked vitamin B12 level

## 2016-05-16 MED FILL — cloNIDine HCL 0.2 MG TABS: 0.2 | 30 days supply | Qty: 90 | Fill #5

## 2016-05-16 MED FILL — LOSARTAN POTASSIUM 100 MG T: 100 | 30 days supply | Qty: 30 | Fill #4

## 2016-05-16 MED FILL — SPIRONOLACTONE 50 MG TABLET: 50 | 30 days supply | Qty: 30 | Fill #3

## 2016-06-03 ENCOUNTER — Other Ambulatory Visit: Payer: Self-pay | Admitting: Family Medicine

## 2016-06-03 DIAGNOSIS — E1169 Type 2 diabetes mellitus with other specified complication: Secondary | ICD-10-CM

## 2016-06-03 DIAGNOSIS — E669 Obesity, unspecified: Principal | ICD-10-CM

## 2016-06-03 MED FILL — ALL DAY ALLERGY 10 MG TAB: 10 | 30 days supply | Qty: 30 | Fill #5

## 2016-06-03 MED FILL — ?OMEPRAZOLE DR 20 MG CAPSUL: 20 | 30 days supply | Qty: 60 | Fill #0

## 2016-06-03 MED FILL — ?METFORMIN HCL 850 MG TABLE: 850 | 30 days supply | Qty: 30 | Fill #0

## 2016-06-17 MED FILL — SPIRONOLACTONE 50 MG TABLET: 50 | 30 days supply | Qty: 30 | Fill #4

## 2016-06-17 MED FILL — LOSARTAN POTASSIUM 100 MG T: 100 | 30 days supply | Qty: 30 | Fill #5

## 2016-06-24 ENCOUNTER — Ambulatory Visit (INDEPENDENT_AMBULATORY_CARE_PROVIDER_SITE_OTHER): Payer: Self-pay | Admitting: Allergy and Immunology

## 2016-06-24 ENCOUNTER — Encounter: Payer: Self-pay | Admitting: Allergy and Immunology

## 2016-06-24 VITALS — BP 118/70 | HR 84 | Temp 97.7°F | Resp 16 | Ht 63.0 in | Wt 265.8 lb

## 2016-06-24 DIAGNOSIS — K219 Gastro-esophageal reflux disease without esophagitis: Secondary | ICD-10-CM

## 2016-06-24 DIAGNOSIS — R05 Cough: Secondary | ICD-10-CM

## 2016-06-24 DIAGNOSIS — J4531 Mild persistent asthma with (acute) exacerbation: Secondary | ICD-10-CM

## 2016-06-24 DIAGNOSIS — R059 Cough, unspecified: Secondary | ICD-10-CM

## 2016-06-24 DIAGNOSIS — J3089 Other allergic rhinitis: Secondary | ICD-10-CM

## 2016-06-24 MED ORDER — MONTELUKAST SODIUM 10 MG PO TABS: 10.0000 mg | ORAL_TABLET | Freq: Every day | ORAL | 3 refills | Status: DC

## 2016-06-24 MED ORDER — BECLOMETHASONE DIPROP HFA 80 MCG/ACT IN AERB: 2.0000 | INHALATION_SPRAY | Freq: Two times a day (BID) | RESPIRATORY_TRACT | 3 refills | Status: DC

## 2016-06-24 MED ORDER — DEXLANSOPRAZOLE 60 MG PO CPDR: 60.0000 mg | DELAYED_RELEASE_CAPSULE | Freq: Every day | ORAL | 3 refills | Status: DC

## 2016-06-24 MED ORDER — RANITIDINE HCL 300 MG PO TABS: 300.0000 mg | ORAL_TABLET | Freq: Every day | ORAL | 5 refills | Status: DC

## 2016-06-24 NOTE — Patient Instructions (Addendum)
  1. Allergen avoidance measures  2. Treat and prevent reflux:   A. eliminate all caffeine and chocolate consumption  B. Dexilant 60 mg in AM  C. Ranitidine 300mg  in PM  D. elevate head of bed 6 inches  E. no eating past 6 PM  3. Treat and prevent inflammation:   A. continue Flonase two sprays each nostril one time per day  B. montelukast 10 mg one time per day  C. Qvar 80 two inhalations two times a day with spacer  4. Replace throat clearing with swallowing maneuver  5. Return to clinic in 2 weeks or earlier if problem
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
## 2016-06-26 ENCOUNTER — Ambulatory Visit: Payer: Self-pay

## 2016-06-30 MED FILL — ?MONTELUKAST SOD 10 MG TAB: 10 | 30 days supply | Qty: 30 | Fill #0

## 2016-06-30 MED FILL — DEXILANT DR 60 MG CAPSULE: 60 | 30 days supply | Qty: 30 | Fill #0

## 2016-06-30 MED FILL — QVAR REDIHALER 80 MCG/ACT A: 80 | 30 days supply | Qty: 11 | Fill #0

## 2016-06-30 MED FILL — raNITIdine HCL 300 MG TABS: 300 | 30 days supply | Qty: 30 | Fill #0

## 2016-07-04 ENCOUNTER — Ambulatory Visit: Payer: Self-pay | Attending: Family Medicine

## 2016-07-07 ENCOUNTER — Ambulatory Visit: Payer: Self-pay

## 2016-07-07 MED FILL — cloNIDine HCL 0.2 MG TABS: 0.2 | 30 days supply | Qty: 90 | Fill #0

## 2016-07-07 MED FILL — ?METFORMIN HCL 850 MG TABLE: 850 | 30 days supply | Qty: 30 | Fill #1

## 2016-07-07 MED FILL — OXYBUTYNIN 5 MG TABLET: 5 | 30 days supply | Qty: 60 | Fill #1

## 2016-07-09 ENCOUNTER — Ambulatory Visit: Payer: No Typology Code available for payment source | Admitting: Allergy and Immunology

## 2016-07-17 ENCOUNTER — Ambulatory Visit (INDEPENDENT_AMBULATORY_CARE_PROVIDER_SITE_OTHER): Payer: Self-pay | Admitting: Otolaryngology

## 2016-07-17 DIAGNOSIS — R05 Cough: Secondary | ICD-10-CM

## 2016-07-24 ENCOUNTER — Other Ambulatory Visit: Payer: Self-pay | Admitting: Family Medicine

## 2016-07-24 DIAGNOSIS — R05 Cough: Secondary | ICD-10-CM

## 2016-07-24 DIAGNOSIS — R059 Cough, unspecified: Secondary | ICD-10-CM

## 2016-07-31 MED FILL — SPIRONOLACTONE 50 MG TABLET: 50 | 30 days supply | Qty: 30 | Fill #5

## 2016-07-31 MED FILL — LOSARTAN POTASSIUM 100 MG T: 100 | 30 days supply | Qty: 30 | Fill #6

## 2016-08-01 MED FILL — ?CETIRIZINE HCL 10 MG TABLE: 10 | 30 days supply | Qty: 30 | Fill #0

## 2016-08-13 ENCOUNTER — Encounter: Payer: Self-pay | Admitting: Family Medicine

## 2016-08-17 IMAGING — CR DG CHEST 1V PORT
1 series · 1 of 1 positions shown · non-contrast
Comparison: Chest radiograph performed 06/03/2014

CLINICAL DATA: Acute onset of generalized chest pain. Initial
encounter.

EXAM:
PORTABLE CHEST - 1 VIEW

[AP]
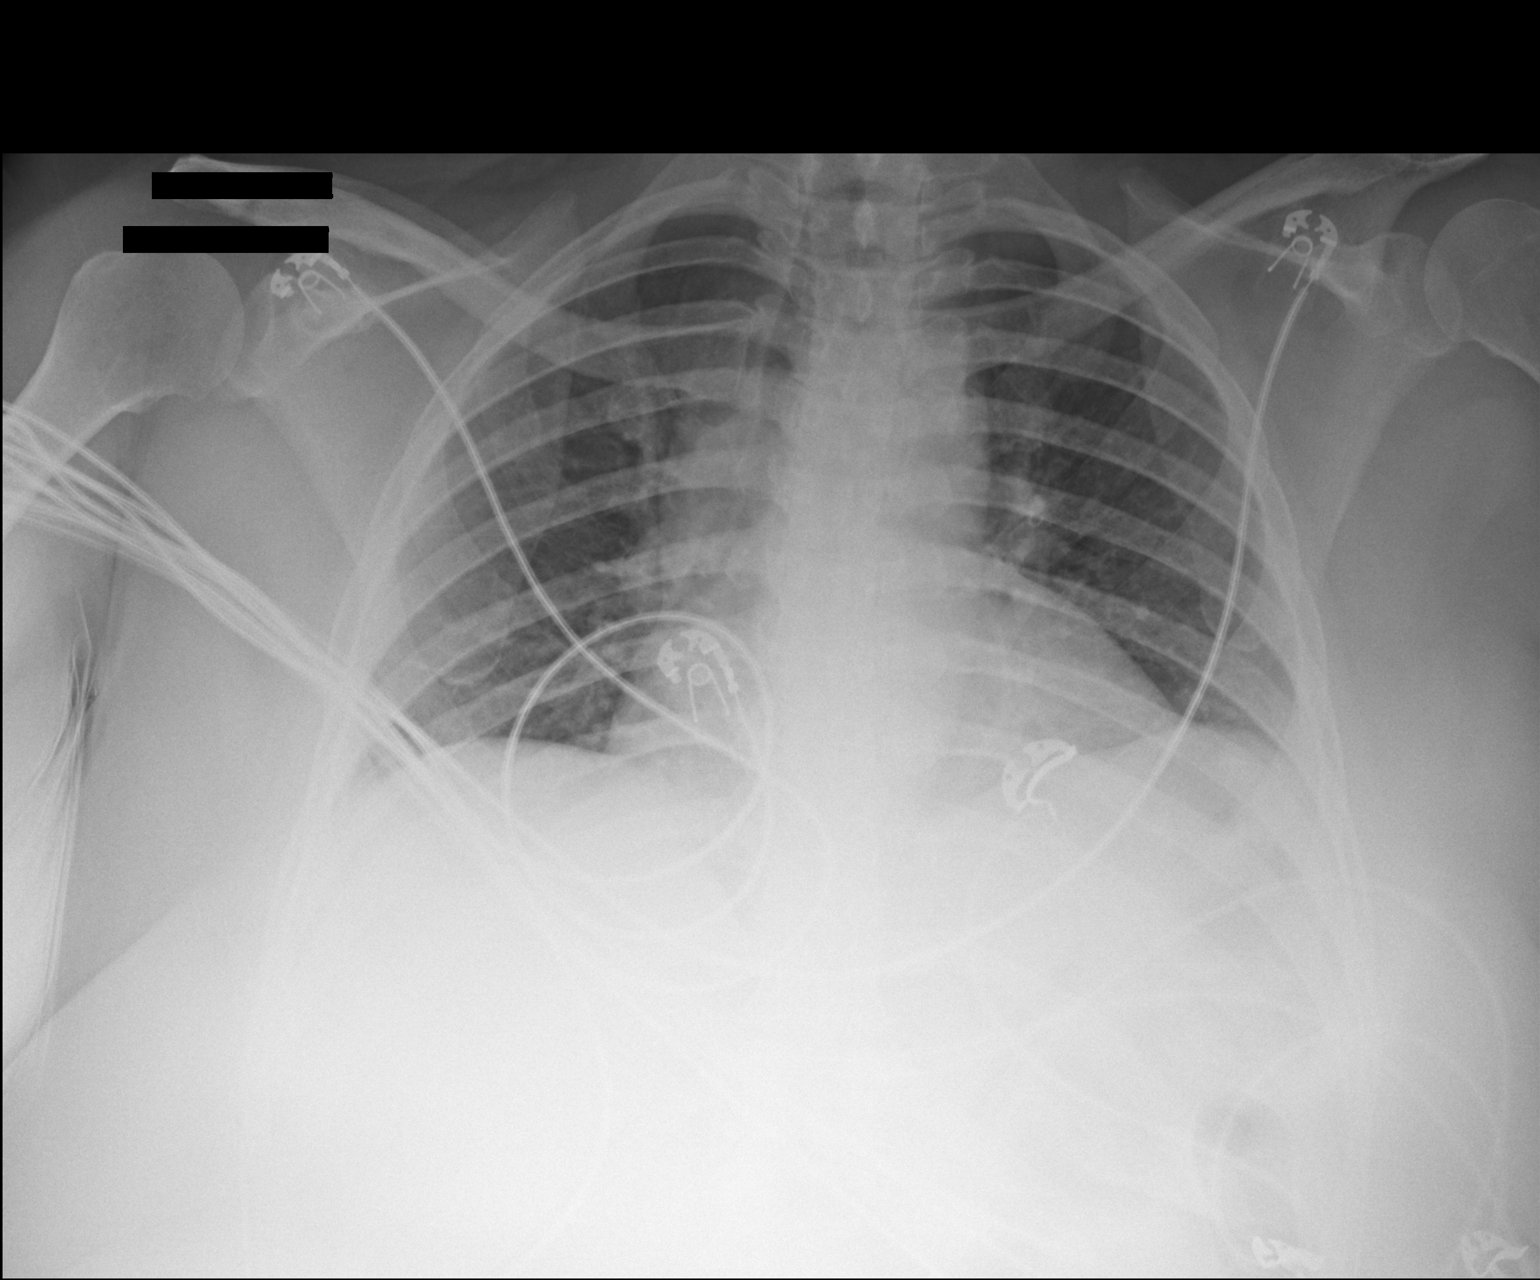

[1 of 1 positions shown; findings below may reference images not displayed]

FINDINGS: The lungs are hypoexpanded. No pleural effusion or pneumothorax is
seen.

The cardiomediastinal silhouette is mildly enlarged. No acute
osseous abnormalities are seen.
IMPRESSION: Lungs hypoexpanded but grossly clear.  Mild cardiomegaly noted.

## 2016-08-18 MED FILL — metFORMIN HCL 850 MG TABS: 850 | 30 days supply | Qty: 30 | Fill #2

## 2016-08-20 MED FILL — cloNIDine HCL 0.2 MG TABS: 0.2 | 30 days supply | Qty: 90 | Fill #1

## 2016-08-22 MED FILL — hydrALAZINE HCL 25 MG TABS: 25 | 30 days supply | Qty: 90 | Fill #3

## 2016-08-26 ENCOUNTER — Other Ambulatory Visit: Payer: Self-pay

## 2016-08-26 ENCOUNTER — Ambulatory Visit: Payer: Self-pay | Attending: Family Medicine | Admitting: Family Medicine

## 2016-08-26 ENCOUNTER — Encounter: Payer: Self-pay | Admitting: Family Medicine

## 2016-08-26 VITALS — BP 120/78 | HR 63 | Temp 97.8°F | Wt 264.0 lb

## 2016-08-26 DIAGNOSIS — E1169 Type 2 diabetes mellitus with other specified complication: Secondary | ICD-10-CM

## 2016-08-26 DIAGNOSIS — R05 Cough: Secondary | ICD-10-CM | POA: Insufficient documentation

## 2016-08-26 DIAGNOSIS — Z79899 Other long term (current) drug therapy: Secondary | ICD-10-CM | POA: Insufficient documentation

## 2016-08-26 DIAGNOSIS — R112 Nausea with vomiting, unspecified: Secondary | ICD-10-CM | POA: Insufficient documentation

## 2016-08-26 DIAGNOSIS — R197 Diarrhea, unspecified: Secondary | ICD-10-CM | POA: Insufficient documentation

## 2016-08-26 DIAGNOSIS — G8929 Other chronic pain: Secondary | ICD-10-CM | POA: Insufficient documentation

## 2016-08-26 DIAGNOSIS — R079 Chest pain, unspecified: Secondary | ICD-10-CM | POA: Insufficient documentation

## 2016-08-26 DIAGNOSIS — Z1231 Encounter for screening mammogram for malignant neoplasm of breast: Secondary | ICD-10-CM

## 2016-08-26 DIAGNOSIS — Z7982 Long term (current) use of aspirin: Secondary | ICD-10-CM | POA: Insufficient documentation

## 2016-08-26 DIAGNOSIS — Z6841 Body Mass Index (BMI) 40.0 and over, adult: Secondary | ICD-10-CM | POA: Insufficient documentation

## 2016-08-26 DIAGNOSIS — E119 Type 2 diabetes mellitus without complications: Secondary | ICD-10-CM | POA: Insufficient documentation

## 2016-08-26 DIAGNOSIS — M545 Low back pain: Secondary | ICD-10-CM | POA: Insufficient documentation

## 2016-08-26 DIAGNOSIS — Z7722 Contact with and (suspected) exposure to environmental tobacco smoke (acute) (chronic): Secondary | ICD-10-CM | POA: Insufficient documentation

## 2016-08-26 DIAGNOSIS — I10 Essential (primary) hypertension: Secondary | ICD-10-CM | POA: Insufficient documentation

## 2016-08-26 DIAGNOSIS — Z7984 Long term (current) use of oral hypoglycemic drugs: Secondary | ICD-10-CM | POA: Insufficient documentation

## 2016-08-26 DIAGNOSIS — E669 Obesity, unspecified: Secondary | ICD-10-CM | POA: Insufficient documentation

## 2016-08-26 LAB — GLUCOSE, POCT (MANUAL RESULT ENTRY): POC GLUCOSE: 115 mg/dL — AB (ref 70–99)

## 2016-08-26 MED ORDER — SPIRONOLACTONE 50 MG PO TABS: 50.0000 mg | ORAL_TABLET | Freq: Every day | ORAL | 5 refills | Status: DC

## 2016-08-26 MED ORDER — CLONIDINE HCL 0.2 MG PO TABS: ORAL_TABLET | ORAL | 5 refills | Status: DC

## 2016-08-26 MED ORDER — LOSARTAN POTASSIUM 100 MG PO TABS: 100.0000 mg | ORAL_TABLET | Freq: Every day | ORAL | 5 refills | Status: DC

## 2016-08-26 MED ORDER — IBUPROFEN 600 MG PO TABS: 600.0000 mg | ORAL_TABLET | Freq: Two times a day (BID) | ORAL | 2 refills | Status: DC | PRN

## 2016-08-26 MED ORDER — HYDRALAZINE HCL 25 MG PO TABS: 25.0000 mg | ORAL_TABLET | Freq: Three times a day (TID) | ORAL | 5 refills | Status: DC

## 2016-08-26 MED FILL — IBUPROFEN 600 MG TABLET: 600 | 30 days supply | Qty: 60 | Fill #0

## 2016-08-26 MED FILL — LOSARTAN POTASSIUM 100 MG T: 100 | 30 days supply | Qty: 30 | Fill #0

## 2016-08-26 NOTE — Assessment & Plan Note (Signed)
10 minute episode of non recurrent chest pain EKG normal except for bradycardia Reassurance Clonidine taper

## 2016-08-26 NOTE — Assessment & Plan Note (Signed)
Low back pain Non radiating  X one year Plan: NSAID Check RA and CCP Lumbar x-ray

## 2016-08-26 NOTE — Assessment & Plan Note (Signed)
A: normotensive with bradycardia and chronic cough on clonidine P: Continue aldactone 50 mg daily Losartan 100 mg daily  Clonidine taper  Hydralazine 25 mg TID with increase to 50 mg TID if needed.

## 2016-08-26 NOTE — Progress Notes (Signed)
Subjective:  Patient ID: Sabrina [name], female    DOB: [birth year]  Age: 56 y.o. MRN: [id number]  CC: Hypertension and Cough   HPI Sabrina [name] presents for    1. Chronic cough: x 2 years. With headache. Intermittently productive. No SOB. Tickle in throat. Some chest pains. She has had 3 negative chest x-rays in the past year. She was referred to pulmonology. She has normal PFTs in 06/2015. She also had a CT angiogram on 10/2015 that was negative for PE and revealed negative lung parenchyma. She denies reflux. 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    She has seen pulmonology who prescribed regimen for chronic cough to cover allergies and GERD.  She has seen the allergist on 06/24/2016. Who recommend the following: 1. Allergen avoidance measures  2. Treat and prevent reflux:            A. eliminate all caffeine and chocolate consumption           B. Dexilant 60 mg in AM           C. Ranitidine 300mg  in PM           D. elevate head of bed 6 inches           E. no eating past 6 PM  3. Treat and prevent inflammation:            A. continue Flonase two sprays each nostril one time per day           B. montelukast 10 mg one time per day           C. Qvar 80 two inhalations two times a day with spacer  4. Replace throat clearing with swallowing maneuver  2. Nausea, vomiting and diarrhea: she reports 2 days ago she was in church and started having chest pain and pressure for 10 minutes. She then developed cough, she has post tussive emesis x one episode. Today she reports an episode of stomach rumbling and diarrhea this morning.  She reports chills, headache and feeling fatigue. Non bloody. No chills. She reports she got some frozen egg from a giveaway that may be the cause of some her GI symptoms. She reports her boyfriend had GI upset as well.   3. HTN: compliant with regimen. Do dizziness or lightheadedness. Slight headache. No shortness  of breath. Some swelling in ankles.   4. DM2: compliant with metformin. Eats low sugar most of the time. Not exercising. She reports intemittent fecal urgency with loose stools.   5. Low back pain: non radiating. Bilateral low back. This is a chronic pain. She is obese. No trauma. She has hx of pain and her knees and back. She had positive rheumatoid factor in the past.   Social History  Substance Use Topics  . Smoking status: Passive Smoke Exposure - Never Smoker  . Smokeless tobacco: Never Used     Comment: Mother & Grandfather.  . Alcohol use No    Outpatient Medications Prior to Visit  Medication Sig Dispense Refill  . acetaminophen (TYLENOL) 325 MG tablet Take 650 mg by mouth every 6 (six) hours as needed for mild pain.    . ALL DAY ALLERGY 10 MG tablet TAKE 1 TABLET BY MOUTH DAILY. 30 tablet 11  . aspirin EC 81 MG tablet Take 1 tablet (81 mg total) by mouth daily. (Patient taking differently: Take 81 mg by mouth every morning. )    .  Beclomethasone Diprop HFA (QVAR REDIHALER) 80 MCG/ACT AERB Inhale 2 puffs into the lungs 2 (two) times daily. 1 Inhaler 3  . cloNIDine (CATAPRES) 0.2 MG tablet Take 1 tablet (0.2 mg total) by mouth 3 (three) times daily. 90 tablet 5  . dexlansoprazole (DEXILANT) 60 MG capsule Take 1 capsule (60 mg total) by mouth daily. 30 capsule 3  . fluticasone (FLONASE) 50 MCG/ACT nasal spray Place 1 spray into both nostrils 2 (two) times daily. 16 g 3  . guaiFENesin-codeine 100-10 MG/5ML syrup Take 5 mLs by mouth 3 (three) times daily as needed for cough. 473 mL 5  . hydrALAZINE (APRESOLINE) 25 MG tablet Take 1 tablet (25 mg total) by mouth every 8 (eight) hours. 90 tablet 5  . ibuprofen (ADVIL,MOTRIN) 200 MG tablet Take 200 mg by mouth every 6 (six) hours as needed (pain).    Marland Kitchen losartan (COZAAR) 100 MG tablet Take 1 tablet by mouth daily.  3  . metFORMIN (GLUCOPHAGE) 850 MG tablet TAKE 1 TABLET BY MOUTH EVERY MORNING. 30 tablet 5  . montelukast (SINGULAIR) 10 MG  tablet Take 1 tablet (10 mg total) by mouth at bedtime. 30 tablet 3  . nitroGLYCERIN (NITROSTAT) 0.4 MG SL tablet Place 1 tablet (0.4 mg total) under the tongue every 5 (five) minutes as needed for chest pain. 25 tablet 3  . nystatin (NYSTATIN) powder Apply topically 3 (three) times daily. Apply under breast to treat skin yeast infection (Patient taking differently: Apply topically 3 (three) times daily as needed (itching/ apply under breast to treat skin yeast infection). ) 60 g 0  . omeprazole (PRILOSEC) 20 MG capsule Take 1 capsule (20 mg total) by mouth 2 (two) times daily before a meal. 60 capsule 5  . oxybutynin (DITROPAN) 5 MG tablet Take 1 tablet (5 mg total) by mouth 2 (two) times daily. 60 tablet 11  . ranitidine (ZANTAC) 300 MG tablet Take 1 tablet (300 mg total) by mouth at bedtime. 30 tablet 5  . spironolactone (ALDACTONE) 50 MG tablet Take 1 tablet (50 mg total) by mouth daily. 30 tablet 5  . traZODone (DESYREL) 50 MG tablet Take 25-50 mg by mouth daily as needed for sleep.     . divalproex (DEPAKOTE ER) 250 MG 24 hr tablet Take 750 mg by mouth at bedtime.     No facility-administered medications prior to visit.     ROS Review of Systems  Constitutional: Negative for chills and fever.  Eyes: Negative for visual disturbance.  Respiratory: Positive for cough. Negative for shortness of breath.   Cardiovascular: Positive for chest pain.  Gastrointestinal: Negative for abdominal distention, abdominal pain, blood in stool, constipation, diarrhea, nausea and vomiting.  Genitourinary: Positive for frequency.  Musculoskeletal: Positive for arthralgias and back pain.  Skin: Negative for rash.  Allergic/Immunologic: Negative for immunocompromised state.  Neurological: Positive for headaches.  Hematological: Negative for adenopathy. Does not bruise/bleed easily.  Psychiatric/Behavioral: Negative for dysphoric mood and suicidal ideas.    Objective:  BP 97/68   Pulse 63   Temp 97.8 F  (36.6 C) (Oral)   Wt 264 lb (119.7 kg)   SpO2 96%   BMI 46.77 kg/m   BP/Weight 08/26/2016 2/35/3614 07/02/1538  Systolic BP 97 086 761  Diastolic BP 68 70 84  Wt. (Lbs) 264 265.8 268.2  BMI 46.77 47.08 46.04   Wt Readings from Last 3 Encounters:  08/26/16 264 lb (119.7 kg)  06/24/16 265 lb 12.8 oz (120.6 kg)  05/08/16 268 lb 3.2 oz (  121.7 kg)    Physical Exam  Constitutional: She is oriented to person, place, and time. She appears well-developed and well-nourished. No distress.  obese  HENT:  Head: Normocephalic and atraumatic.  Cardiovascular: Normal rate, regular rhythm, normal heart sounds and intact distal pulses.   Pulmonary/Chest: Effort normal and breath sounds normal.  Genitourinary: Rectum normal. Rectal exam shows no external hemorrhoid, no internal hemorrhoid, no fissure, no mass, no tenderness, anal tone normal and guaiac negative stool.  Musculoskeletal: She exhibits edema (trace LE edema ).  Neurological: She is alert and oriented to person, place, and time.  Skin: Skin is warm and dry. No rash noted.  Psychiatric: She has a normal mood and affect.   Lab Results  Component Value Date   HGBA1C 5.6 05/08/2016   CBG 115  EKG: sinus bradycardia. HR 50  Assessment & Plan:  Derrian was seen today for hypertension and cough.  Diagnoses and all orders for this visit:  Diabetes mellitus type 2 in obese ([place]) -     POCT glucose (manual entry) -     Ambulatory referral to Ophthalmology -     CMP and Liver -     Lipid Panel  Visit for screening mammogram -     MM DIGITAL SCREENING BILATERAL; Future  Essential hypertension -     cloNIDine (CATAPRES) 0.2 MG tablet; Tapering off -     hydrALAZINE (APRESOLINE) 25 MG tablet; Take 1 tablet (25 mg total) by mouth every 8 (eight) hours. -     losartan (COZAAR) 100 MG tablet; Take 1 tablet (100 mg total) by mouth daily. -     spironolactone (ALDACTONE) 50 MG tablet; Take 1 tablet (50 mg total) by mouth daily.  Chest  pain, unspecified type -     EKG 12-Lead  Chronic bilateral low back pain without sciatica -     ibuprofen (ADVIL,MOTRIN) 600 MG tablet; Take 1 tablet (600 mg total) by mouth 2 (two) times daily as needed (back pain). -     DG Lumbar Spine 2-3 Views; Future -     RA Qn+CCP(IgG/A)+SjoSSA+SjoSSB    No orders of the defined types were placed in this encounter.   Follow-up: Return in about 4 weeks (around 09/23/2016) for BP check .   Boykin Nearing MD

## 2016-08-26 NOTE — Patient Instructions (Addendum)
Sabrina [name] was seen today for hypertension and cough.  Diagnoses and all orders for this visit:  Diabetes mellitus type 2 in obese ([place]) -     POCT glucose (manual entry) -     Ambulatory referral to Ophthalmology -     CMP and Liver -     Lipid Panel  Visit for screening mammogram -     MM DIGITAL SCREENING BILATERAL; Future  Essential hypertension -     cloNIDine (CATAPRES) 0.2 MG tablet; Tapering off -     hydrALAZINE (APRESOLINE) 25 MG tablet; Take 1 tablet (25 mg total) by mouth every 8 (eight) hours. -     spironolactone (ALDACTONE) 50 MG tablet; Take 1 tablet (50 mg total) by mouth daily.  Chest pain, unspecified type -     EKG 12-Lead  Chronic bilateral low back pain without sciatica -     ibuprofen (ADVIL,MOTRIN) 600 MG tablet; Take 1 tablet (600 mg total) by mouth 2 (two) times daily as needed (back pain). -     DG Lumbar Spine 2-3 Views; Future -     RA Qn+CCP(IgG/A)+SjoSSA+SjoSSB  Other orders -     losartan (COZAAR) 100 MG tablet; Take 1 tablet (100 mg total) by mouth daily.   Your EKG show a normal but slowed rhythm.  Please taper off clonidine from 0.2 mg 3 times daily  to 0.1 mg 3 times daily for 3 days Then  0.1 mg twice daily for 3 days  then 0.1 mg daily for 3 days then stop.  Please call Rolena Infante, [phone],  with the BCCCP (breast and cervical cancer control program) at the [facility] to set up an appointment to verify eligibility for a breast exam, mammogram, ultrasound. If you qualify this will be set up at [facility]  F/u in 1 week for pharmacy  BP check since tapering off clonidine   F/u with me in 4 weeks for HTN/ chronic cough   Dr. Adrian Blackwater    Low Back Strain Rehab Ask your health care provider which exercises are safe for you. Do exercises exactly as told by your health care provider and adjust them as directed. It is normal to feel mild stretching, pulling, tightness, or discomfort as you do these exercises, but you should stop  right away if you feel sudden pain or your pain gets worse. Do not begin these exercises until told by your health care provider. Stretching and range of motion exercises These exercises warm up your muscles and joints and improve the movement and flexibility of your back. These exercises also help to relieve pain, numbness, and tingling. Exercise A: Single knee to chest   1. Lie on your back on a firm surface with both legs straight. 2. Bend one of your knees. Use your hands to move your knee up toward your chest until you feel a gentle stretch in your lower back and buttock.  Hold your leg in this position by holding onto the front of your knee.  Keep your other leg as straight as possible. 3. Hold for __________ seconds. 4. Slowly return to the starting position. 5. Repeat with your other leg. Repeat __________ times. Complete this exercise __________ times a day. Exercise B: Prone extension on elbows   1. Lie on your abdomen on a firm surface. 2. Prop yourself up on your elbows. 3. Use your arms to help lift your chest up until you feel a gentle stretch in your abdomen and your lower back.  This will place some of your body weight on your elbows. If this is uncomfortable, try stacking pillows under your chest.  Your hips should stay down, against the surface that you are lying on. Keep your hip and back muscles relaxed. 4. Hold for __________ seconds. 5. Slowly relax your upper body and return to the starting position. Repeat __________ times. Complete this exercise __________ times a day. Strengthening exercises These exercises build strength and endurance in your back. Endurance is the ability to use your muscles for a long time, even after they get tired. Exercise C: Pelvic tilt  1. Lie on your back on a firm surface. Bend your knees and keep your feet flat. 2. Tense your abdominal muscles. Tip your pelvis up toward the ceiling and flatten your lower back into the floor.  To  help with this exercise, you may place a small towel under your lower back and try to push your back into the towel. 3. Hold for __________ seconds. 4. Let your muscles relax completely before you repeat this exercise. Repeat __________ times. Complete this exercise __________ times a day. Exercise D: Alternating arm and leg raises   1. Get on your hands and knees on a firm surface. If you are on a hard floor, you may want to use padding to cushion your knees, such as an exercise mat. 2. Line up your arms and legs. Your hands should be below your shoulders, and your knees should be below your hips. 3. Lift your left leg behind you. At the same time, raise your right arm and straighten it in front of you.  Do not lift your leg higher than your hip.  Do not lift your arm higher than your shoulder.  Keep your abdominal and back muscles tight.  Keep your hips facing the ground.  Do not arch your back.  Keep your balance carefully, and do not hold your breath. 4. Hold for __________ seconds. 5. Slowly return to the starting position and repeat with your right leg and your left arm. Repeat __________ times. Complete this exercise __________times a day. Exercise J: Single leg lower with bent knees  1. Lie on your back on a firm surface. 2. Tense your abdominal muscles and lift your feet off the floor, one foot at a time, so your knees and hips are bent in an "L" shape (at about 90 degrees).  Your knees should be over your hips and your lower legs should be parallel to the floor. 3. Keeping your abdominal muscles tense and your knee bent, slowly lower one of your legs so your toe touches the ground. 4. Lift your leg back up to return to the starting position.  Do not hold your breath.  Do not let your back arch. Keep your back flat against the ground. 5. Repeat with your other leg. Repeat __________ times. Complete this exercise __________ times a day. Posture and body mechanics   Body  mechanics refers to the movements and positions of your body while you do your daily activities. Posture is part of body mechanics. Good posture and healthy body mechanics can help to relieve stress in your body's tissues and joints. Good posture means that your spine is in its natural S-curve position (your spine is neutral), your shoulders are pulled back slightly, and your head is not tipped forward. The following are general guidelines for applying improved posture and body mechanics to your everyday activities. Standing    When standing, keep your spine neutral  and your feet about hip-width apart. Keep a slight bend in your knees. Your ears, shoulders, and hips should line up.  When you do a task in which you stand in one place for a long time, place one foot up on a stable object that is 2-4 inches (5-10 cm) high, such as a footstool. This helps keep your spine neutral. Sitting    When sitting, keep your spine neutral and keep your feet flat on the floor. Use a footrest, if necessary, and keep your thighs parallel to the floor. Avoid rounding your shoulders, and avoid tilting your head forward.  When working at a desk or a computer, keep your desk at a height where your hands are slightly lower than your elbows. Slide your chair under your desk so you are close enough to maintain good posture.  When working at a computer, place your monitor at a height where you are looking straight ahead and you do not have to tilt your head forward or downward to look at the screen. Resting    When lying down and resting, avoid positions that are most painful for you.  If you have pain with activities such as sitting, bending, stooping, or squatting (flexion-based activities), lie in a position in which your body does not bend very much. For example, avoid curling up on your side with your arms and knees near your chest (fetal position).  If you have pain with activities such as standing for a long time  or reaching with your arms (extension-based activities), lie with your spine in a neutral position and bend your knees slightly. Try the following positions:  Lying on your side with a pillow between your knees.  Lying on your back with a pillow under your knees. Lifting    When lifting objects, keep your feet at least shoulder-width apart and tighten your abdominal muscles.  Bend your knees and hips and keep your spine neutral. It is important to lift using the strength of your legs, not your back. Do not lock your knees straight out.  Always ask for help to lift heavy or awkward objects. This information is not intended to replace advice given to you by your health care provider. Make sure you discuss any questions you have with your health care provider. Document Released: 03/17/2005 Document Revised: 11/22/2015 Document Reviewed: 12/27/2014 Elsevier Interactive Patient Education  2017 [employer].

## 2016-08-28 LAB — LIPID PANEL
CHOLESTEROL TOTAL: 181 mg/dL (ref 100–199)
Chol/HDL Ratio: 2.1 ratio (ref 0.0–4.4)
HDL: 85 mg/dL (ref >39)
LDL CALC: 69 mg/dL (ref 0–99)
Triglycerides: 134 mg/dL (ref 0–149)
VLDL CHOLESTEROL CAL: 27 mg/dL (ref 5–40)

## 2016-08-28 LAB — CMP AND LIVER
ALK PHOS: 71 IU/L (ref 39–117)
ALT: 19 IU/L (ref 0–32)
AST: 16 IU/L (ref 0–40)
Albumin: 4.2 g/dL (ref 3.5–5.5)
BUN: 12 mg/dL (ref 6–24)
Bilirubin Total: 0.2 mg/dL (ref 0.0–1.2)
Bilirubin, Direct: 0.07 mg/dL (ref 0.00–0.40)
CALCIUM: 9.9 mg/dL (ref 8.7–10.2)
CO2: 22 mmol/L (ref 18–29)
CREATININE: 0.8 mg/dL (ref 0.57–1.00)
Chloride: 99 mmol/L (ref 96–106)
GFR calc Af Amer: 97 mL/min/{1.73_m2} (ref >59)
GFR calc non Af Amer: 84 mL/min/{1.73_m2} (ref >59)
Glucose: 99 mg/dL (ref 65–99)
Potassium: 4.7 mmol/L (ref 3.5–5.2)
Sodium: 138 mmol/L (ref 134–144)
Total Protein: 7.6 g/dL (ref 6.0–8.5)

## 2016-08-28 LAB — RA QN+CCP(IGG/A)+SJOSSA+SJOSSB
Cyclic Citrullin Peptide Ab: 9 units (ref 0–19)
ENA SSB (LA) Ab: <0.2 AI (ref 0.0–0.9)
Rheumatoid fact SerPl-aCnc: 63 IU/mL — ABNORMAL HIGH (ref 0.0–13.9)

## 2016-08-29 MED FILL — OXYBUTYNIN 5 MG TABLET: 5 | 30 days supply | Qty: 60 | Fill #2

## 2016-09-01 ENCOUNTER — Encounter: Payer: Self-pay | Admitting: Family Medicine

## 2016-09-02 NOTE — Telephone Encounter (Signed)
Patient concern

## 2016-09-11 MED FILL — SPIRONOLACTONE 50 MG TABLET: 50 | 30 days supply | Qty: 30 | Fill #6

## 2016-09-11 MED FILL — ?CETIRIZINE HCL 10 MG TABLE: 10 | 30 days supply | Qty: 30 | Fill #1

## 2016-09-14 ENCOUNTER — Encounter (HOSPITAL_COMMUNITY): Payer: Self-pay

## 2016-09-14 ENCOUNTER — Emergency Department (HOSPITAL_COMMUNITY)
Admission: EM | Admit: 2016-09-14 | Discharge: 2016-09-14 | Disposition: A | Discharge status: Home / Self Care | Payer: Self-pay | Attending: Emergency Medicine | Admitting: Emergency Medicine

## 2016-09-14 ENCOUNTER — Emergency Department (HOSPITAL_COMMUNITY): Payer: Self-pay

## 2016-09-14 DIAGNOSIS — Z79899 Other long term (current) drug therapy: Secondary | ICD-10-CM | POA: Insufficient documentation

## 2016-09-14 DIAGNOSIS — W231XXA Caught, crushed, jammed, or pinched between stationary objects, initial encounter: Secondary | ICD-10-CM | POA: Insufficient documentation

## 2016-09-14 DIAGNOSIS — Z7982 Long term (current) use of aspirin: Secondary | ICD-10-CM | POA: Insufficient documentation

## 2016-09-14 DIAGNOSIS — Z7722 Contact with and (suspected) exposure to environmental tobacco smoke (acute) (chronic): Secondary | ICD-10-CM | POA: Insufficient documentation

## 2016-09-14 DIAGNOSIS — Y999 Unspecified external cause status: Secondary | ICD-10-CM | POA: Insufficient documentation

## 2016-09-14 DIAGNOSIS — Y92513 Shop (commercial) as the place of occurrence of the external cause: Secondary | ICD-10-CM | POA: Insufficient documentation

## 2016-09-14 DIAGNOSIS — I10 Essential (primary) hypertension: Secondary | ICD-10-CM | POA: Insufficient documentation

## 2016-09-14 DIAGNOSIS — S99921A Unspecified injury of right foot, initial encounter: Secondary | ICD-10-CM

## 2016-09-14 DIAGNOSIS — Y9301 Activity, walking, marching and hiking: Secondary | ICD-10-CM | POA: Insufficient documentation

## 2016-09-14 DIAGNOSIS — E119 Type 2 diabetes mellitus without complications: Secondary | ICD-10-CM | POA: Insufficient documentation

## 2016-09-14 DIAGNOSIS — Z7984 Long term (current) use of oral hypoglycemic drugs: Secondary | ICD-10-CM | POA: Insufficient documentation

## 2016-09-14 DIAGNOSIS — G8929 Other chronic pain: Secondary | ICD-10-CM | POA: Insufficient documentation

## 2016-09-14 DIAGNOSIS — Z91040 Latex allergy status: Secondary | ICD-10-CM | POA: Insufficient documentation

## 2016-09-14 NOTE — ED Notes (Signed)
Declined W/C at D/C and was escorted to lobby by RN. 
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
## 2016-09-14 NOTE — Discharge Instructions (Signed)
Please follow up with your primary care doctor. Your x-rays today did not show any obvious fractures or dislocations.

## 2016-09-14 NOTE — ED Triage Notes (Signed)
Onset today pt was walking in store, hit right great toe on steel knob.  Toe is stinging.  No break in skin.  Because she is diabetic pt wants to get foot checked out.

## 2016-09-18 MED ORDER — CLONIDINE HCL 0.1 MG PO TABS: ORAL_TABLET | ORAL | 0 refills | Status: DC

## 2016-09-18 MED FILL — ?CLONIDINE HCL 0.1 MG TABL: 0.1 | 18 days supply | Qty: 18 | Fill #0

## 2016-09-24 ENCOUNTER — Telehealth: Payer: Self-pay

## 2016-09-24 NOTE — Telephone Encounter (Signed)
Pt was called and informed of lab results. 

## 2016-10-06 MED FILL — ?METFORMIN HCL 850 MG TABLE: 850 | 30 days supply | Qty: 30 | Fill #3

## 2016-10-06 MED FILL — MONTELUKAST SOD 10 MG TAB: 10 | 30 days supply | Qty: 30 | Fill #1

## 2016-10-06 MED FILL — raNITIdine HCL 300 MG TABS: 300 | 30 days supply | Qty: 30 | Fill #1

## 2016-10-09 ENCOUNTER — Ambulatory Visit: Payer: Self-pay | Attending: Family Medicine | Admitting: Physician Assistant

## 2016-10-09 ENCOUNTER — Encounter: Payer: Self-pay | Admitting: Physician Assistant

## 2016-10-09 VITALS — BP 138/88 | HR 88 | Temp 98.2°F | Resp 18 | Ht 64.0 in | Wt 263.2 lb

## 2016-10-09 DIAGNOSIS — R05 Cough: Secondary | ICD-10-CM | POA: Insufficient documentation

## 2016-10-09 DIAGNOSIS — Z7984 Long term (current) use of oral hypoglycemic drugs: Secondary | ICD-10-CM | POA: Insufficient documentation

## 2016-10-09 DIAGNOSIS — E669 Obesity, unspecified: Secondary | ICD-10-CM

## 2016-10-09 DIAGNOSIS — Z7982 Long term (current) use of aspirin: Secondary | ICD-10-CM | POA: Insufficient documentation

## 2016-10-09 DIAGNOSIS — M109 Gout, unspecified: Secondary | ICD-10-CM | POA: Insufficient documentation

## 2016-10-09 DIAGNOSIS — E119 Type 2 diabetes mellitus without complications: Secondary | ICD-10-CM | POA: Insufficient documentation

## 2016-10-09 DIAGNOSIS — Z880 Allergy status to penicillin: Secondary | ICD-10-CM | POA: Insufficient documentation

## 2016-10-09 DIAGNOSIS — F319 Bipolar disorder, unspecified: Secondary | ICD-10-CM | POA: Insufficient documentation

## 2016-10-09 DIAGNOSIS — Z79899 Other long term (current) drug therapy: Secondary | ICD-10-CM | POA: Insufficient documentation

## 2016-10-09 DIAGNOSIS — F419 Anxiety disorder, unspecified: Secondary | ICD-10-CM | POA: Insufficient documentation

## 2016-10-09 DIAGNOSIS — E785 Hyperlipidemia, unspecified: Secondary | ICD-10-CM | POA: Insufficient documentation

## 2016-10-09 DIAGNOSIS — E1169 Type 2 diabetes mellitus with other specified complication: Secondary | ICD-10-CM

## 2016-10-09 DIAGNOSIS — Z6841 Body Mass Index (BMI) 40.0 and over, adult: Secondary | ICD-10-CM | POA: Insufficient documentation

## 2016-10-09 DIAGNOSIS — I10 Essential (primary) hypertension: Secondary | ICD-10-CM | POA: Insufficient documentation

## 2016-10-09 DIAGNOSIS — K219 Gastro-esophageal reflux disease without esophagitis: Secondary | ICD-10-CM | POA: Insufficient documentation

## 2016-10-09 DIAGNOSIS — J449 Chronic obstructive pulmonary disease, unspecified: Secondary | ICD-10-CM | POA: Insufficient documentation

## 2016-10-09 DIAGNOSIS — M069 Rheumatoid arthritis, unspecified: Secondary | ICD-10-CM | POA: Insufficient documentation

## 2016-10-09 LAB — POCT GLYCOSYLATED HEMOGLOBIN (HGB A1C): HEMOGLOBIN A1C: 5.8

## 2016-10-09 LAB — GLUCOSE, POCT (MANUAL RESULT ENTRY): POC GLUCOSE: 127 mg/dL — AB (ref 70–99)

## 2016-10-09 MED ORDER — HYDRALAZINE HCL 50 MG PO TABS: 50.0000 mg | ORAL_TABLET | Freq: Three times a day (TID) | ORAL | 60 refills | Status: DC

## 2016-10-09 MED ORDER — RANITIDINE HCL 300 MG PO TABS: 300.0000 mg | ORAL_TABLET | Freq: Every day | ORAL | 5 refills | Status: DC

## 2016-10-09 NOTE — Progress Notes (Signed)
Patient ID: Sabrina [name], female   DOB: 08-21-1960, 56 y.o.   MRN: [id number]   Sabrina [name], is a 56 y.o. female  [id number]  [id number]  DOB - 1961-02-14  Subjective:  Chief Complaint and HPI: Sabrina [name] is a 56 y.o. female here today for diabetes check, cough recheck, and BP check.  Dr Adrian Blackwater recently titrated her down/off off Clonidine hoping it would help her cough.  Cough is still persistent but maybe improving a little.  She has only been out of the clonidine for about 5 days.  She is not checking BP OOO.  She does not check ger blood sugars regularly.  She denies HA/dizziness/CP/changes in vision.    ROS:   Constitutional:  No f/c, No night sweats, No unexplained weight loss. EENT:  No vision changes, No blurry vision, No hearing changes. No mouth, throat, or ear problems.  Respiratory: + cough X 2 years, No SOB Cardiac: No CP, no palpitations GI:  No abd pain, No N/V/D. GU: No Urinary s/sx Musculoskeletal: No joint pain Neuro: No headache, no dizziness, no motor weakness.  Skin: No rash Endocrine:  No polydipsia. No polyuria.  Psych: Denies SI/HI  No problems updated.  ALLERGIES: Allergies  Allergen Reactions  . Benzonatate Other (See Comments)    Made her cough worse  . Latex Other (See Comments)    Hands were scaly  . Penicillins Other (See Comments)    Unknown childhood allergic reaction, Has patient had a PCN reaction causing immediate rash, facial/tongue/throat swelling, SOB or lightheadedness with hypotension:  Has patient had a PCN reaction causing severe rash involving mucus membranes or skin necrosis: No Has patient had a PCN reaction that required hospitalization No Has patient had a PCN reaction occurring within the last 10 years: No If all of the above answers are "NO", then may proceed with Cephalosporin use.   Marland Kitchen Lisinopril Cough  . Oxycodone-Acetaminophen Nausea And Vomiting    Pt thinks she may be able to take with food    PAST  MEDICAL HISTORY: Past Medical History:  Diagnosis Date  . Anxiety   . Arthritis    "legs, knees, hands" (11/16/2014)  . Bipolar disorder ([place])    2 breakdowns - 1998, 2000 had to be hospitalized, followed at [facility]  . Chest pain    a. Myoview 6/16:  anterior and apical ischemia, EF 55-65%;  b. LHC 8/16:  no CAD, Normal EF  . Chest pain 10/2015  . Chronic bronchitis ([place])    "get it q yr"  . Chronic lower back pain   . Depression   . GERD (gastroesophageal reflux disease)   . Gout   . History of echocardiogram    a. Echo 12/15:  Mild LVH, EF 55-60%, mild LAE, PASP 36 mmHg  . Hyperlipidemia LDL goal < 100    "not on RX" (11/15/2014)  . Hypertension   . Migraine    "monthly" (11/16/2014)  . Mixed restrictive and obstructive lung disease ([place])    Health serve chart suggests PFTs done 1/10  . Morbid obesity with BMI of 40.0-44.9, adult ([place])   . Rheumatoid arthritis [facility])    Health serve records indicate Rheumatoid  . Seizures ([place])    "might have had 1; I'm on depakote" (11/16/2014)  . Type II diabetes mellitus ([place])     MEDICATIONS AT HOME: Prior to Admission medications   Medication Sig Start Date End Date Taking? Authorizing Provider  acetaminophen (TYLENOL) 325 MG tablet Take 650 mg by  mouth every 6 (six) hours as needed for mild pain.   Yes [provider]  ALL DAY ALLERGY 10 MG tablet TAKE 1 TABLET BY MOUTH DAILY. 08/01/16  Yes Funches, Adriana Mccallum, MD  aspirin EC 81 MG tablet Take 1 tablet (81 mg total) by mouth daily. Patient taking differently: Take 81 mg by mouth every morning.  08/06/15  Yes Josue Hector, MD  Beclomethasone Diprop HFA (QVAR REDIHALER) 80 MCG/ACT AERB Inhale 2 puffs into the lungs 2 (two) times daily. 06/24/16  Yes Kozlow, Donnamarie Poag, MD  dexlansoprazole (DEXILANT) 60 MG capsule Take 1 capsule (60 mg total) by mouth daily. 06/24/16  Yes Kozlow, Donnamarie Poag, MD  divalproex (DEPAKOTE ER) 250 MG 24 hr tablet Take 750 mg by mouth at bedtime.   Yes [provider]  fluticasone (FLONASE) 50 MCG/ACT nasal spray Place 1 spray into both nostrils 2 (two) times daily. 03/20/16  Yes Javier Glazier, MD  guaiFENesin-codeine 100-10 MG/5ML syrup Take 5 mLs by mouth 3 (three) times daily as needed for cough. 05/08/16  Yes Funches, Josalyn, MD  ibuprofen (ADVIL,MOTRIN) 600 MG tablet Take 1 tablet (600 mg total) by mouth 2 (two) times daily as needed (back pain). 08/26/16  Yes Funches, Josalyn, MD  losartan (COZAAR) 100 MG tablet Take 1 tablet (100 mg total) by mouth daily. 08/26/16  Yes Funches, Josalyn, MD  metFORMIN (GLUCOPHAGE) 850 MG tablet TAKE 1 TABLET BY MOUTH EVERY MORNING. 06/03/16  Yes Funches, Josalyn, MD  montelukast (SINGULAIR) 10 MG tablet Take 1 tablet (10 mg total) by mouth at bedtime. 06/24/16  Yes Kozlow, Donnamarie Poag, MD  nitroGLYCERIN (NITROSTAT) 0.4 MG SL tablet Place 1 tablet (0.4 mg total) under the tongue every 5 (five) minutes as needed for chest pain. 11/10/14  Yes Weaver, Scott T, PA-C  nystatin (NYSTATIN) powder Apply topically 3 (three) times daily. Apply under breast to treat skin yeast infection Patient taking differently: Apply topically 3 (three) times daily as needed (itching/ apply under breast to treat skin yeast infection).  10/11/15  Yes Funches, Josalyn, MD  oxybutynin (DITROPAN) 5 MG tablet Take 1 tablet (5 mg total) by mouth 2 (two) times daily. 03/04/16  Yes Funches, Josalyn, MD  ranitidine (ZANTAC) 300 MG tablet Take 1 tablet (300 mg total) by mouth at bedtime. 10/09/16  Yes Argentina Donovan, PA-C  spironolactone (ALDACTONE) 50 MG tablet Take 1 tablet (50 mg total) by mouth daily. 08/26/16  Yes Funches, Josalyn, MD  traZODone (DESYREL) 50 MG tablet Take 25-50 mg by mouth daily as needed for sleep.    Yes [provider]  hydrALAZINE (APRESOLINE) 50 MG tablet Take 1 tablet (50 mg total) by mouth 3 (three) times daily. 10/09/16   Argentina Donovan, PA-C     Objective:  EXAM:   Vitals:   10/09/16 1105  BP: 120/80    Pulse: 88  Resp: 18  Temp: 98.2 F (36.8 C)  TempSrc: Oral  SpO2: 96%  Weight: 263 lb 3.2 oz (119.4 kg)  Height: 5\' 4"  (1.626 m)    General appearance : A&OX3. NAD. Non-toxic-appearing HEENT: Atraumatic and Normocephalic.  PERRLA. EOM intact.  TM clear B. Mouth-MMM, post pharynx WNL w/o erythema, No PND. Neck: supple, no JVD. No cervical lymphadenopathy. No thyromegaly Chest/Lungs:  Breathing-non-labored, Good air entry bilaterally, breath sounds normal without rales, rhonchi, or wheezing  CVS: S1 S2 regular, no murmurs, gallops, rubs Extremities: Bilateral Lower Ext shows no edema, both legs are warm to touch with = pulse  throughout Neurology:  CN II-XII grossly intact, Non focal.   Psych:  TP linear. J/I WNL. Normal speech. Appropriate eye contact and affect.  Skin:  No Rash  Data Review Lab Results  Component Value Date   HGBA1C 5.8 10/09/2016   HGBA1C 5.6 05/08/2016   HGBA1C 5.6 10/11/2015     Assessment & Plan   1. Essential hypertension Uncontrolled.  Discussed with Theda Sers, clinical pharmacist-will increase dose of hydralazine - hydrALAZINE (APRESOLINE) 50 MG tablet; Take 1 tablet (50 mg total) by mouth 3 (three) times daily.  Dispense: 90 tablet; Refill: 60 Continue Losartan and Spirinolactone  2. Diabetes mellitus type 2 in obese (HCC) Controlled.  Continue metformin.  Continue to work on low sugar/low salt diet and weight loss for overall health. - POCT glucose (manual entry) - HgB A1c  3.  Cough-persistent/longstanding X 2 years.  Give a little more time off clonidine and reassess for changes at f/up  Patient have been counseled extensively about nutrition and exercise  Return in about 4 weeks (around 11/06/2016) for assign PCP with Dr Wynetta Emery; f/up htn/DM.  The patient was given clear instructions to go to ER or return to medical center if symptoms don't improve, worsen or new problems develop. The patient verbalized understanding. The patient was  told to call to get lab results if they haven't heard anything in the next week.     Freeman Caldron, PA-C [facility] [place], [place]   10/09/2016, 11:29 AM

## 2016-10-09 NOTE — Patient Instructions (Signed)
Check blood pressure daily and record and bring to next visit 

## 2016-10-10 MED FILL — OXYBUTYNIN 5 MG TABLET: 5 | 30 days supply | Qty: 60 | Fill #3

## 2016-10-14 MED FILL — raNITIdine HCL 300 MG TABS: 300 | 30 days supply | Qty: 30 | Fill #0

## 2016-10-16 ENCOUNTER — Ambulatory Visit: Payer: Self-pay | Admitting: Licensed Clinical Social Worker

## 2016-10-16 ENCOUNTER — Encounter: Payer: Self-pay | Admitting: Family Medicine

## 2016-10-16 ENCOUNTER — Ambulatory Visit: Payer: Self-pay | Attending: Family Medicine | Admitting: Family Medicine

## 2016-10-16 VITALS — BP 125/87 | HR 95 | Temp 98.4°F | Ht 64.0 in | Wt 261.0 lb

## 2016-10-16 DIAGNOSIS — Z7722 Contact with and (suspected) exposure to environmental tobacco smoke (acute) (chronic): Secondary | ICD-10-CM | POA: Insufficient documentation

## 2016-10-16 DIAGNOSIS — F329 Major depressive disorder, single episode, unspecified: Secondary | ICD-10-CM | POA: Insufficient documentation

## 2016-10-16 DIAGNOSIS — F319 Bipolar disorder, unspecified: Secondary | ICD-10-CM | POA: Insufficient documentation

## 2016-10-16 DIAGNOSIS — Z7982 Long term (current) use of aspirin: Secondary | ICD-10-CM | POA: Insufficient documentation

## 2016-10-16 DIAGNOSIS — R05 Cough: Secondary | ICD-10-CM | POA: Insufficient documentation

## 2016-10-16 DIAGNOSIS — E1169 Type 2 diabetes mellitus with other specified complication: Secondary | ICD-10-CM

## 2016-10-16 DIAGNOSIS — F3110 Bipolar disorder, current episode manic without psychotic features, unspecified: Secondary | ICD-10-CM

## 2016-10-16 DIAGNOSIS — I10 Essential (primary) hypertension: Secondary | ICD-10-CM

## 2016-10-16 DIAGNOSIS — Z7984 Long term (current) use of oral hypoglycemic drugs: Secondary | ICD-10-CM | POA: Insufficient documentation

## 2016-10-16 DIAGNOSIS — F310 Bipolar disorder, current episode hypomanic: Secondary | ICD-10-CM

## 2016-10-16 DIAGNOSIS — E669 Obesity, unspecified: Secondary | ICD-10-CM

## 2016-10-16 DIAGNOSIS — M069 Rheumatoid arthritis, unspecified: Secondary | ICD-10-CM | POA: Insufficient documentation

## 2016-10-16 DIAGNOSIS — M05762 Rheumatoid arthritis with rheumatoid factor of left knee without organ or systems involvement: Secondary | ICD-10-CM

## 2016-10-16 DIAGNOSIS — E119 Type 2 diabetes mellitus without complications: Secondary | ICD-10-CM | POA: Insufficient documentation

## 2016-10-16 DIAGNOSIS — M05761 Rheumatoid arthritis with rheumatoid factor of right knee without organ or systems involvement: Secondary | ICD-10-CM

## 2016-10-16 DIAGNOSIS — J45909 Unspecified asthma, uncomplicated: Secondary | ICD-10-CM | POA: Insufficient documentation

## 2016-10-16 LAB — GLUCOSE, POCT (MANUAL RESULT ENTRY): POC Glucose: 152 mg/dl — AB (ref 70–99)

## 2016-10-16 MED ORDER — METFORMIN HCL 850 MG PO TABS: 850.0000 mg | ORAL_TABLET | Freq: Every morning | ORAL | 5 refills | Status: DC

## 2016-10-16 MED ORDER — LOSARTAN POTASSIUM 100 MG PO TABS: 100.0000 mg | ORAL_TABLET | Freq: Every evening | ORAL | 5 refills | Status: DC

## 2016-10-16 MED ORDER — SPIRONOLACTONE 50 MG PO TABS: 50.0000 mg | ORAL_TABLET | Freq: Every day | ORAL | 5 refills | Status: DC

## 2016-10-16 MED ORDER — HYDRALAZINE HCL 25 MG PO TABS: 25.0000 mg | ORAL_TABLET | Freq: Three times a day (TID) | ORAL | 5 refills | Status: DC

## 2016-10-16 MED FILL — LOSARTAN POTASSIUM 100 MG T: 100 | 30 days supply | Qty: 30 | Fill #0

## 2016-10-16 MED FILL — SPIRONOLACTONE 50 MG TAB: 50 | 30 days supply | Qty: 30 | Fill #0

## 2016-10-16 MED FILL — hydrALAZINE HCL 25 MG TABS: 25 | 30 days supply | Qty: 90 | Fill #0

## 2016-10-16 NOTE — Assessment & Plan Note (Signed)
Well controlled Continue current regimen Hydralazine 25 mg TID

## 2016-10-16 NOTE — Assessment & Plan Note (Signed)
A: diabetes Well controlled Some GI upset with metformin P: Continue metformin, consider XR if Gi upset persist

## 2016-10-16 NOTE — Assessment & Plan Note (Signed)
Tearful today Compliant with medications Internal referral to Clinical social worker placed

## 2016-10-16 NOTE — Progress Notes (Addendum)
Subjective:  Patient ID: Sabrina [name], female    DOB: 1960-07-02  Age: 56 y.o. MRN: [id number]  CC: Medication Management   HPI BITANIA SHANKLAND has HTN, diabetes, asthma, chronic cough, back and knee pain with positive rheumatoid factor in the past, bipolar disorder treated at [facility] she  presents for   1. Med review: she comes today with all of her medications. The only medication she does not have is the oxybutynin. She reports she rarely takes Qvar. She also reports taking trazodone 1/2 tablets, 25 mg, most nights.   2. Bipolar depression: she is a bit tearful today. She shows the diploma of her sister who has a Designer, jewellery in education. She is happy for her, but sad that she has a low paying job and is indigent. She also has difficulties with her bills.  3. Chronic cough: she has tapered off clonidine. Cough has improved. She had elevated BP one months ago and her hydralazine dose as increased to 50 mg TID from 25 mg. She has not taken the increased dose.    Social History  Substance Use Topics  . Smoking status: Passive Smoke Exposure - Never Smoker  . Smokeless tobacco: Never Used     Comment: Mother & Grandfather.  . Alcohol use No    Outpatient Medications Prior to Visit  Medication Sig Dispense Refill  . acetaminophen (TYLENOL) 325 MG tablet Take 650 mg by mouth every 6 (six) hours as needed for mild pain.    Marland Kitchen aspirin EC 81 MG tablet Take 1 tablet (81 mg total) by mouth daily. (Patient taking differently: Take 81 mg by mouth every morning. )    . Beclomethasone Diprop HFA (QVAR REDIHALER) 80 MCG/ACT AERB Inhale 2 puffs into the lungs 2 (two) times daily. 1 Inhaler 3  . divalproex (DEPAKOTE ER) 250 MG 24 hr tablet Take 750 mg by mouth at bedtime.    . hydrALAZINE (APRESOLINE) 50 MG tablet Take 1 tablet (50 mg total) by mouth 3 (three) times daily. 90 tablet 60  . ibuprofen (ADVIL,MOTRIN) 600 MG tablet Take 1 tablet (600 mg total) by mouth 2 (two) times daily as needed  (back pain). 60 tablet 2  . losartan (COZAAR) 100 MG tablet Take 1 tablet (100 mg total) by mouth daily. 30 tablet 5  . metFORMIN (GLUCOPHAGE) 850 MG tablet TAKE 1 TABLET BY MOUTH EVERY MORNING. 30 tablet 5  . montelukast (SINGULAIR) 10 MG tablet Take 1 tablet (10 mg total) by mouth at bedtime. 30 tablet 3  . nitroGLYCERIN (NITROSTAT) 0.4 MG SL tablet Place 1 tablet (0.4 mg total) under the tongue every 5 (five) minutes as needed for chest pain. 25 tablet 3  . nystatin (NYSTATIN) powder Apply topically 3 (three) times daily. Apply under breast to treat skin yeast infection (Patient taking differently: Apply topically 3 (three) times daily as needed (itching/ apply under breast to treat skin yeast infection). ) 60 g 0  . oxybutynin (DITROPAN) 5 MG tablet Take 1 tablet (5 mg total) by mouth 2 (two) times daily. 60 tablet 11  . ranitidine (ZANTAC) 300 MG tablet Take 1 tablet (300 mg total) by mouth at bedtime. 30 tablet 5  . spironolactone (ALDACTONE) 50 MG tablet Take 1 tablet (50 mg total) by mouth daily. 30 tablet 5  . traZODone (DESYREL) 50 MG tablet Take 25-50 mg by mouth daily as needed for sleep.     Marland Kitchen ALL DAY ALLERGY 10 MG tablet TAKE 1 TABLET BY MOUTH DAILY.  30 tablet 11  . dexlansoprazole (DEXILANT) 60 MG capsule Take 1 capsule (60 mg total) by mouth daily. (Patient not taking: Reported on 10/16/2016) 30 capsule 3  . fluticasone (FLONASE) 50 MCG/ACT nasal spray Place 1 spray into both nostrils 2 (two) times daily. (Patient not taking: Reported on 10/16/2016) 16 g 3  . guaiFENesin-codeine 100-10 MG/5ML syrup Take 5 mLs by mouth 3 (three) times daily as needed for cough. (Patient not taking: Reported on 10/16/2016) 473 mL 5   No facility-administered medications prior to visit.     ROS Review of Systems  Constitutional: Negative for chills and fever.  Eyes: Negative for visual disturbance.  Respiratory: Positive for cough. Negative for shortness of breath.   Cardiovascular: Negative for  chest pain.  Gastrointestinal: Negative for abdominal distention, abdominal pain, blood in stool, constipation, diarrhea, nausea and vomiting.  Genitourinary: Positive for frequency.  Musculoskeletal: Positive for arthralgias (knees and toes) and back pain.  Skin: Negative for rash.  Allergic/Immunologic: Negative for immunocompromised state.  Neurological: Negative for headaches.  Hematological: Negative for adenopathy. Does not bruise/bleed easily.  Psychiatric/Behavioral: Negative for dysphoric mood and suicidal ideas.    Objective:  BP 125/87   Pulse 95   Temp 98.4 F (36.9 C) (Oral)   Ht 5\' 4"  (1.626 m)   Wt 261 lb (118.4 kg)   SpO2 97%   BMI 44.80 kg/m   BP/Weight 10/16/2016 10/09/2016 9/93/7169  Systolic BP 678 938 101  Diastolic BP 87 88 85  Wt. (Lbs) 261 263.2 -  BMI 44.8 45.18 -   Wt Readings from Last 3 Encounters:  10/16/16 261 lb (118.4 kg)  10/09/16 263 lb 3.2 oz (119.4 kg)  08/26/16 264 lb (119.7 kg)    Physical Exam  Constitutional: She is oriented to person, place, and time. She appears well-developed and well-nourished. No distress.  obese  HENT:  Head: Normocephalic and atraumatic.  Cardiovascular: Normal rate, regular rhythm, normal heart sounds and intact distal pulses.   Pulmonary/Chest: Effort normal and breath sounds normal.  Genitourinary: Rectum normal. Rectal exam shows no external hemorrhoid, no internal hemorrhoid, no fissure, no mass, no tenderness, anal tone normal and guaiac negative stool.  Musculoskeletal: She exhibits edema (in R leg, R leg is larger than Left ).  Neurological: She is alert and oriented to person, place, and time.  Skin: Skin is warm and dry. No rash noted.  Psychiatric: She has a normal mood and affect.   Lab Results  Component Value Date   HGBA1C 5.8 10/09/2016   CBG 152  Assessment & Plan:  Fleurette was seen today for medication management.  Diagnoses and all orders for this visit:  Diabetes mellitus type 2  in obese ([place]) -     POCT glucose (manual entry) -     metFORMIN (GLUCOPHAGE) 850 MG tablet; Take 1 tablet (850 mg total) by mouth every morning.  Essential hypertension -     losartan (COZAAR) 100 MG tablet; Take 1 tablet (100 mg total) by mouth every evening. -     spironolactone (ALDACTONE) 50 MG tablet; Take 1 tablet (50 mg total) by mouth daily. -     Discontinue: hydrALAZINE (APRESOLINE) 25 MG tablet; Take 1 tablet (25 mg total) by mouth 3 (three) times daily. -     hydrALAZINE (APRESOLINE) 25 MG tablet; Take 1 tablet (25 mg total) by mouth 3 (three) times daily.  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   Follow-up: Return in about 3 months (around 01/16/2017) for HTN and diabetes.   Boykin Nearing MD

## 2016-10-16 NOTE — Patient Instructions (Addendum)
Sabrina [name] was seen today for medication management.  Diagnoses and all orders for this visit:  Diabetes mellitus type 2 in obese ([place]) -     POCT glucose (manual entry) -     metFORMIN (GLUCOPHAGE) 850 MG tablet; Take 1 tablet (850 mg total) by mouth every morning.  Essential hypertension -     losartan (COZAAR) 100 MG tablet; Take 1 tablet (100 mg total) by mouth every evening. -     spironolactone (ALDACTONE) 50 MG tablet; Take 1 tablet (50 mg total) by mouth daily. -     Discontinue: hydrALAZINE (APRESOLINE) 25 MG tablet; Take 1 tablet (25 mg total) by mouth 3 (three) times daily. -     hydrALAZINE (APRESOLINE) 25 MG tablet; Take 1 tablet (25 mg total) by mouth 3 (three) times daily.  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 Adrian Blackwater

## 2016-10-16 NOTE — BH Specialist Note (Signed)
Integrated Behavioral Health Initial Visit  MRN: [id number] Name: Sabrina [name]   Session Start time: 3:20 PM Session End time: 4:10 PM Total time: 50 minutes  Type of Service: [place] Interpretor:No. Interpretor Name and Language: N/A   Warm Hand Off Completed.       SUBJECTIVE: Sabrina [name] is a 56 y.o. female accompanied by patient. Patient was referred by Dr. Adrian Blackwater for depression. Patient reports the following symptoms/concerns: feelings of sadness, worry, and of being a failure, difficulty sleeping, and low energy  Duration of problem: Ongoing; Pt diagnosed with BiPolar disorder "over twenty years ago"; Severity of problem: mild  OBJECTIVE: Mood: Depressed and Affect: Tearful Risk of harm to self or others: No plan to harm self or others   LIFE CONTEXT: Family and Social: Pt resides with boyfriend. She receives support from boyfriend and sister (who resides in [place], [place]) School/Work: Pt is employed through a Education officer, environmental. Boyfriend is employed full time.  Self-Care: Pt prays to cope with stress Life Changes: Pt is experiencing financial strain and is having difficulty obtaining stable full time employment  GOALS ADDRESSED: Patient will reduce symptoms of: depression and increase knowledge and/or ability of: coping skills and also: Increase adequate support systems for patient/family   INTERVENTIONS: Supportive Counseling and Link to [employer]  Standardized Assessments completed: GAD-7 and PHQ 2&9  ASSESSMENT: Patient currently experiencing depression triggered by financial strain. She reports feelings of sadness, worry, and of being a failure, difficulty sleeping, and low energy. Denied SI/HI/AVH. Patient receives financial support from partner and sister. Patient may benefit from psychotherapy and linking to community resources. [place] educated pt on how stress can negatively impact one's physical and mental  health. LCSWA discussed benefits of applying healthy coping skills to decrease symptoms. Pt was successful in identifying healthy strategies to implement on a routine basis. She participates in medication management through [facility] and plans to initiate counseling, as well. LCSWA provided pt with community resources to assist in obtaining employment and information on free dental clinics.   PLAN: 1. Follow up with behavioral health clinician on : Pt was encouraged to contact LCSWA if symptoms worsen or fail to improve to schedule behavioral appointments at [facility]. 2. Behavioral recommendations: LCSWA recommends that pt apply healthy coping skills discussed. Pt is encouraged to schedule follow up appointment with LCSWA 3. Referral(s): Employment Resources and Information on [employer] 4. "From scale of 1-10, how likely are you to follow plan?": 9/10  Rebekah Chesterfield, LCSW 10/17/16 4:37 PM

## 2016-11-06 ENCOUNTER — Ambulatory Visit: Payer: Self-pay | Admitting: Internal Medicine

## 2016-12-10 MED FILL — OXYBUTYNIN 5 MG TABLET: 5 | 30 days supply | Qty: 60 | Fill #4

## 2016-12-10 MED FILL — LOSARTAN POTASSIUM 100 MG T: 100 | 30 days supply | Qty: 30 | Fill #1

## 2016-12-10 MED FILL — SPIRONOLACTONE 50 MG TAB: 50 | 30 days supply | Qty: 30 | Fill #1

## 2016-12-10 MED FILL — ?CETIRIZINE HCL 10 MG TABLE: 10 | 30 days supply | Qty: 30 | Fill #2

## 2016-12-10 MED FILL — ?METFOMIN HCL 850MG TABS: 850 | 30 days supply | Qty: 30 | Fill #4

## 2016-12-11 MED FILL — hydrALAZINE HCL 25 MG TABS: 25 | 30 days supply | Qty: 90 | Fill #1

## 2017-01-09 ENCOUNTER — Encounter: Payer: Self-pay | Admitting: Internal Medicine

## 2017-01-09 ENCOUNTER — Ambulatory Visit: Payer: Self-pay | Attending: Internal Medicine | Admitting: Internal Medicine

## 2017-01-09 VITALS — BP 129/86 | HR 78 | Temp 98.2°F | Resp 16 | Wt 270.8 lb

## 2017-01-09 DIAGNOSIS — N393 Stress incontinence (female) (male): Secondary | ICD-10-CM | POA: Insufficient documentation

## 2017-01-09 DIAGNOSIS — E785 Hyperlipidemia, unspecified: Secondary | ICD-10-CM | POA: Insufficient documentation

## 2017-01-09 DIAGNOSIS — Z885 Allergy status to narcotic agent status: Secondary | ICD-10-CM | POA: Insufficient documentation

## 2017-01-09 DIAGNOSIS — M069 Rheumatoid arthritis, unspecified: Secondary | ICD-10-CM | POA: Insufficient documentation

## 2017-01-09 DIAGNOSIS — Z880 Allergy status to penicillin: Secondary | ICD-10-CM | POA: Insufficient documentation

## 2017-01-09 DIAGNOSIS — Z9841 Cataract extraction status, right eye: Secondary | ICD-10-CM | POA: Insufficient documentation

## 2017-01-09 DIAGNOSIS — Z888 Allergy status to other drugs, medicaments and biological substances status: Secondary | ICD-10-CM | POA: Insufficient documentation

## 2017-01-09 DIAGNOSIS — Z91040 Latex allergy status: Secondary | ICD-10-CM | POA: Insufficient documentation

## 2017-01-09 DIAGNOSIS — Z599 Problem related to housing and economic circumstances, unspecified: Secondary | ICD-10-CM

## 2017-01-09 DIAGNOSIS — Z1231 Encounter for screening mammogram for malignant neoplasm of breast: Secondary | ICD-10-CM

## 2017-01-09 DIAGNOSIS — R05 Cough: Secondary | ICD-10-CM

## 2017-01-09 DIAGNOSIS — Z9889 Other specified postprocedural states: Secondary | ICD-10-CM | POA: Insufficient documentation

## 2017-01-09 DIAGNOSIS — Z820 Family history of epilepsy and other diseases of the nervous system: Secondary | ICD-10-CM | POA: Insufficient documentation

## 2017-01-09 DIAGNOSIS — I10 Essential (primary) hypertension: Secondary | ICD-10-CM | POA: Insufficient documentation

## 2017-01-09 DIAGNOSIS — Z1239 Encounter for other screening for malignant neoplasm of breast: Secondary | ICD-10-CM

## 2017-01-09 DIAGNOSIS — Z7982 Long term (current) use of aspirin: Secondary | ICD-10-CM | POA: Insufficient documentation

## 2017-01-09 DIAGNOSIS — Z79899 Other long term (current) drug therapy: Secondary | ICD-10-CM | POA: Insufficient documentation

## 2017-01-09 DIAGNOSIS — Z7722 Contact with and (suspected) exposure to environmental tobacco smoke (acute) (chronic): Secondary | ICD-10-CM | POA: Insufficient documentation

## 2017-01-09 DIAGNOSIS — E1169 Type 2 diabetes mellitus with other specified complication: Secondary | ICD-10-CM

## 2017-01-09 DIAGNOSIS — J45909 Unspecified asthma, uncomplicated: Secondary | ICD-10-CM | POA: Insufficient documentation

## 2017-01-09 DIAGNOSIS — Z8249 Family history of ischemic heart disease and other diseases of the circulatory system: Secondary | ICD-10-CM | POA: Insufficient documentation

## 2017-01-09 DIAGNOSIS — E119 Type 2 diabetes mellitus without complications: Secondary | ICD-10-CM | POA: Insufficient documentation

## 2017-01-09 DIAGNOSIS — Z955 Presence of coronary angioplasty implant and graft: Secondary | ICD-10-CM | POA: Insufficient documentation

## 2017-01-09 DIAGNOSIS — Z836 Family history of other diseases of the respiratory system: Secondary | ICD-10-CM | POA: Insufficient documentation

## 2017-01-09 DIAGNOSIS — Z23 Encounter for immunization: Secondary | ICD-10-CM | POA: Insufficient documentation

## 2017-01-09 DIAGNOSIS — K219 Gastro-esophageal reflux disease without esophagitis: Secondary | ICD-10-CM | POA: Insufficient documentation

## 2017-01-09 DIAGNOSIS — F319 Bipolar disorder, unspecified: Secondary | ICD-10-CM | POA: Insufficient documentation

## 2017-01-09 DIAGNOSIS — R053 Chronic cough: Secondary | ICD-10-CM

## 2017-01-09 DIAGNOSIS — Z8042 Family history of malignant neoplasm of prostate: Secondary | ICD-10-CM | POA: Insufficient documentation

## 2017-01-09 DIAGNOSIS — Z590 Homelessness: Secondary | ICD-10-CM | POA: Insufficient documentation

## 2017-01-09 DIAGNOSIS — Z7984 Long term (current) use of oral hypoglycemic drugs: Secondary | ICD-10-CM | POA: Insufficient documentation

## 2017-01-09 DIAGNOSIS — Z818 Family history of other mental and behavioral disorders: Secondary | ICD-10-CM | POA: Insufficient documentation

## 2017-01-09 DIAGNOSIS — Z6841 Body Mass Index (BMI) 40.0 and over, adult: Secondary | ICD-10-CM | POA: Insufficient documentation

## 2017-01-09 DIAGNOSIS — Z803 Family history of malignant neoplasm of breast: Secondary | ICD-10-CM | POA: Insufficient documentation

## 2017-01-09 DIAGNOSIS — Z823 Family history of stroke: Secondary | ICD-10-CM | POA: Insufficient documentation

## 2017-01-09 DIAGNOSIS — E669 Obesity, unspecified: Secondary | ICD-10-CM

## 2017-01-09 DIAGNOSIS — Z808 Family history of malignant neoplasm of other organs or systems: Secondary | ICD-10-CM | POA: Insufficient documentation

## 2017-01-09 DIAGNOSIS — Z598 Other problems related to housing and economic circumstances: Secondary | ICD-10-CM

## 2017-01-09 DIAGNOSIS — Z800 Family history of malignant neoplasm of digestive organs: Secondary | ICD-10-CM | POA: Insufficient documentation

## 2017-01-09 DIAGNOSIS — Z833 Family history of diabetes mellitus: Secondary | ICD-10-CM | POA: Insufficient documentation

## 2017-01-09 DIAGNOSIS — G4733 Obstructive sleep apnea (adult) (pediatric): Secondary | ICD-10-CM | POA: Insufficient documentation

## 2017-01-09 LAB — GLUCOSE, POCT (MANUAL RESULT ENTRY): POC Glucose: 110 mg/dl — AB (ref 70–99)
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
## 2017-01-09 NOTE — Patient Instructions (Signed)

## 2017-02-02 MED FILL — ?METFOMIN HCL 850MG TABS: 850 | 30 days supply | Qty: 30 | Fill #5

## 2017-02-02 MED FILL — LOSARTAN POTASSIUM 100 MG T: 100 | 30 days supply | Qty: 30 | Fill #2

## 2017-02-02 MED FILL — ?SPIRONOLACTONE 50 MG TABLE: 50 | 30 days supply | Qty: 30 | Fill #2

## 2017-02-02 MED FILL — ?CETIRIZINE HCL 10 MG TABLE: 10 | 30 days supply | Qty: 30 | Fill #3

## 2017-02-02 MED FILL — hydrALAZINE HCL 25 MG TABS: 25 | 30 days supply | Qty: 90 | Fill #2

## 2017-02-02 MED FILL — OXYBUTYNIN 5 MG TABLET: 5 | 30 days supply | Qty: 60 | Fill #5

## 2017-02-09 ENCOUNTER — Ambulatory Visit (INDEPENDENT_AMBULATORY_CARE_PROVIDER_SITE_OTHER): Payer: Self-pay | Admitting: Otolaryngology

## 2017-03-04 ENCOUNTER — Ambulatory Visit: Payer: Self-pay | Attending: Internal Medicine

## 2017-03-11 ENCOUNTER — Other Ambulatory Visit: Payer: Self-pay | Admitting: Pharmacist

## 2017-03-11 DIAGNOSIS — N393 Stress incontinence (female) (male): Secondary | ICD-10-CM

## 2017-03-11 MED ORDER — OXYBUTYNIN CHLORIDE 5 MG PO TABS: 5.0000 mg | ORAL_TABLET | Freq: Two times a day (BID) | ORAL | 2 refills | Status: DC

## 2017-03-11 MED FILL — OXYBUTYNIN 5 MG TABLET: 5 | 30 days supply | Qty: 60 | Fill #0

## 2017-03-18 MED FILL — ?METFOMIN HCL 850MG TABS: 850 | 30 days supply | Qty: 30 | Fill #0

## 2017-03-18 MED FILL — LOSARTAN POTASSIUM 100 MG T: 100 | 30 days supply | Qty: 30 | Fill #3

## 2017-03-18 MED FILL — ?CETIRIZINE HCL 10 MG TABLE: 10 | 30 days supply | Qty: 30 | Fill #4

## 2017-03-18 MED FILL — ?SPIRONOLACTONE 50 MG TABLE: 50 | 30 days supply | Qty: 30 | Fill #3

## 2017-04-02 ENCOUNTER — Ambulatory Visit (INDEPENDENT_AMBULATORY_CARE_PROVIDER_SITE_OTHER): Payer: No Typology Code available for payment source | Admitting: Otolaryngology

## 2017-04-02 DIAGNOSIS — R05 Cough: Secondary | ICD-10-CM

## 2017-04-23 MED FILL — ?METFOMIN HCL 850MG TABS: 850 | 30 days supply | Qty: 30 | Fill #1

## 2017-04-23 MED FILL — ?CETIRIZINE HCL 10 MG TABLE: 10 | 30 days supply | Qty: 30 | Fill #5

## 2017-04-23 MED FILL — ?SPIRONOLACTONE 50 MG TABLE: 50 | 30 days supply | Qty: 30 | Fill #4

## 2017-04-23 MED FILL — hydrALAZINE HCL 25 MG TABS: 25 | 30 days supply | Qty: 90 | Fill #3

## 2017-04-23 MED FILL — raNITIdine HCL 300 MG TABS: 300 | 30 days supply | Qty: 30 | Fill #1

## 2017-04-23 MED FILL — LOSARTAN POTASSIUM 100 MG T: 100 | 30 days supply | Qty: 30 | Fill #4

## 2017-04-29 ENCOUNTER — Ambulatory Visit: Payer: Self-pay | Admitting: Pulmonary Disease

## 2017-04-30 ENCOUNTER — Ambulatory Visit (INDEPENDENT_AMBULATORY_CARE_PROVIDER_SITE_OTHER): Payer: No Typology Code available for payment source | Admitting: Pulmonary Disease

## 2017-04-30 ENCOUNTER — Encounter: Payer: Self-pay | Admitting: Pulmonary Disease

## 2017-04-30 VITALS — BP 136/82 | HR 80 | Ht 64.0 in | Wt 269.6 lb

## 2017-04-30 DIAGNOSIS — R059 Cough, unspecified: Secondary | ICD-10-CM

## 2017-04-30 DIAGNOSIS — R05 Cough: Secondary | ICD-10-CM

## 2017-04-30 LAB — POCT EXHALED NITRIC OXIDE: FENO LEVEL (PPB): 11

## 2017-04-30 LAB — NITRIC OXIDE: NITRIC OXIDE: 11

## 2017-04-30 MED ORDER — DEXLANSOPRAZOLE 60 MG PO CPDR: 60.0000 mg | DELAYED_RELEASE_CAPSULE | Freq: Every day | ORAL | 4 refills | Status: DC

## 2017-04-30 MED ORDER — AZELASTINE-FLUTICASONE 137-50 MCG/ACT NA SUSP: 2.0000 | Freq: Every day | NASAL | 3 refills | Status: DC

## 2017-04-30 MED ORDER — CHLORPHENIRAMINE MALEATE 4 MG PO TABS: 8.0000 mg | ORAL_TABLET | Freq: Three times a day (TID) | ORAL | 1 refills | Status: DC

## 2017-04-30 MED FILL — !DEXILANT DR 60 MG CAPSULE: 60 | 30 days supply | Qty: 30 | Fill #0

## 2017-04-30 NOTE — Patient Instructions (Signed)
We will start on chlorpheniramine 8 mg 3 times daily and Dymista nasal spray. If Dymista is not covered with your insurance we will use Astelin and Flonase We will put her back on Dexilant 60 mg once daily We will check a FENO. Follow-up in 1-2 months.
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
## 2017-05-05 ENCOUNTER — Telehealth: Payer: Self-pay | Admitting: Pulmonary Disease

## 2017-05-05 NOTE — Telephone Encounter (Signed)
lmtcb X1 for pt  

## 2017-05-05 NOTE — Telephone Encounter (Signed)
Pt is calling back  [phone]

## 2017-05-06 NOTE — Telephone Encounter (Signed)
Yes. Ok to send in astelin and flonase instead of dymista.

## 2017-05-06 NOTE — Telephone Encounter (Signed)
Do we send in Astelin and Flonase in place of Dymista? I called pt and advised her I would get recommendations from her doctor and call her back. PM please advise.

## 2017-05-07 ENCOUNTER — Ambulatory Visit: Payer: Self-pay | Attending: Internal Medicine

## 2017-05-07 MED ORDER — FLUTICASONE PROPIONATE 50 MCG/ACT NA SUSP: 2.0000 | Freq: Every day | NASAL | 3 refills | Status: DC

## 2017-05-07 MED ORDER — AZELASTINE HCL 0.1 % NA SOLN: 2.0000 | Freq: Every day | NASAL | 3 refills | Status: DC

## 2017-05-07 NOTE — Telephone Encounter (Signed)
Pt returned call Advised of Rx change Pt voiced her understanding Refills on Astelin 2 sprays each nostril daily and Flonase 2 sprays each nostril daily sent to verified pharmacy  Nothing further needed; will sign off

## 2017-05-07 NOTE — Telephone Encounter (Signed)
lmtcb for pt.  Will send in rx's after speaking to pt.

## 2017-05-21 MED FILL — FLUTICASONE PROP 50 MCG SPR: 50 | 30 days supply | Qty: 16 | Fill #0

## 2017-05-28 ENCOUNTER — Ambulatory Visit: Payer: No Typology Code available for payment source | Admitting: Pulmonary Disease

## 2017-06-02 MED FILL — ?CETIRIZINE HCL 10 MG TABLE: 10 | 30 days supply | Qty: 30 | Fill #6

## 2017-06-02 MED FILL — LOSARTAN POTASSIUM 100 MG T: 100 | 30 days supply | Qty: 30 | Fill #5

## 2017-06-02 MED FILL — OXYBUTYNIN 5 MG TABLET: 5 | 30 days supply | Qty: 60 | Fill #1

## 2017-06-02 MED FILL — SPIRONOLACTONE 50 MG TABLET: 50 | 30 days supply | Qty: 30 | Fill #5

## 2017-06-02 MED FILL — hydrALAZINE HCL 25 MG TABS: 25 | 30 days supply | Qty: 90 | Fill #4

## 2017-06-02 MED FILL — ?METFORMIN HCL 850 MG TABLE: 850 | 30 days supply | Qty: 30 | Fill #2

## 2017-06-03 ENCOUNTER — Ambulatory Visit: Payer: No Typology Code available for payment source | Attending: Internal Medicine | Admitting: Physician Assistant

## 2017-06-03 VITALS — BP 130/82 | HR 90 | Temp 98.3°F | Resp 18 | Ht 64.0 in | Wt 277.0 lb

## 2017-06-03 DIAGNOSIS — Z880 Allergy status to penicillin: Secondary | ICD-10-CM | POA: Insufficient documentation

## 2017-06-03 DIAGNOSIS — E118 Type 2 diabetes mellitus with unspecified complications: Secondary | ICD-10-CM | POA: Insufficient documentation

## 2017-06-03 DIAGNOSIS — R252 Cramp and spasm: Secondary | ICD-10-CM | POA: Insufficient documentation

## 2017-06-03 DIAGNOSIS — K219 Gastro-esophageal reflux disease without esophagitis: Secondary | ICD-10-CM | POA: Insufficient documentation

## 2017-06-03 DIAGNOSIS — Z7984 Long term (current) use of oral hypoglycemic drugs: Secondary | ICD-10-CM | POA: Insufficient documentation

## 2017-06-03 DIAGNOSIS — Z79899 Other long term (current) drug therapy: Secondary | ICD-10-CM | POA: Insufficient documentation

## 2017-06-03 DIAGNOSIS — I10 Essential (primary) hypertension: Secondary | ICD-10-CM | POA: Insufficient documentation

## 2017-06-03 DIAGNOSIS — Z888 Allergy status to other drugs, medicaments and biological substances status: Secondary | ICD-10-CM | POA: Insufficient documentation

## 2017-06-03 DIAGNOSIS — Z7982 Long term (current) use of aspirin: Secondary | ICD-10-CM | POA: Insufficient documentation

## 2017-06-03 DIAGNOSIS — E669 Obesity, unspecified: Secondary | ICD-10-CM

## 2017-06-03 DIAGNOSIS — Z6841 Body Mass Index (BMI) 40.0 and over, adult: Secondary | ICD-10-CM | POA: Insufficient documentation

## 2017-06-03 DIAGNOSIS — Z885 Allergy status to narcotic agent status: Secondary | ICD-10-CM | POA: Insufficient documentation

## 2017-06-03 DIAGNOSIS — M069 Rheumatoid arthritis, unspecified: Secondary | ICD-10-CM | POA: Insufficient documentation

## 2017-06-03 DIAGNOSIS — E785 Hyperlipidemia, unspecified: Secondary | ICD-10-CM | POA: Insufficient documentation

## 2017-06-03 DIAGNOSIS — J449 Chronic obstructive pulmonary disease, unspecified: Secondary | ICD-10-CM | POA: Insufficient documentation

## 2017-06-03 DIAGNOSIS — E1169 Type 2 diabetes mellitus with other specified complication: Secondary | ICD-10-CM

## 2017-06-03 DIAGNOSIS — Z91040 Latex allergy status: Secondary | ICD-10-CM | POA: Insufficient documentation

## 2017-06-03 LAB — POCT GLYCOSYLATED HEMOGLOBIN (HGB A1C): Hemoglobin A1C: 5.8

## 2017-06-03 LAB — GLUCOSE, POCT (MANUAL RESULT ENTRY): POC Glucose: 100 mg/dl — AB (ref 70–99)

## 2017-06-03 NOTE — Patient Instructions (Signed)
Leg Cramps Leg cramps occur when a muscle or muscles tighten and you have no control over this tightening (involuntary muscle contraction). Muscle cramps can develop in any muscle, but the most common place is in the calf muscles of the leg. Those cramps can occur during exercise or when you are at rest. Leg cramps are painful, and they may last for a few seconds to a few minutes. Cramps may return several times before they finally stop. Usually, leg cramps are not caused by a serious medical problem. In many cases, the cause is not known. Some common causes include:  Overexertion.  Overuse from repetitive motions, or doing the same thing over and over.  Remaining in a certain position for a long period of time.  Improper preparation, form, or technique while performing a sport or an activity.  Dehydration.  Injury.  Side effects of some medicines.  Abnormally low levels of the salts and ions in your blood (electrolytes), especially potassium and calcium. These levels could be low if you are taking water pills (diuretics) or if you are pregnant.  Follow these instructions at home: Watch your condition for any changes. Taking the following actions may help to lessen any discomfort that you are feeling:  Stay well-hydrated. Drink enough fluid to keep your urine clear or pale yellow.  Try massaging, stretching, and relaxing the affected muscle. Do this for several minutes at a time.  For tight or tense muscles, use a warm towel, heating pad, or hot shower water directed to the affected area.  If you are sore or have pain after a cramp, applying ice to the affected area may relieve discomfort. ? Put ice in a plastic bag. ? Place a towel between your skin and the bag. ? Leave the ice on for 20 minutes, 2-3 times per day.  Avoid strenuous exercise for several days if you have been having frequent leg cramps.  Make sure that your diet includes the essential minerals for your muscles to  work normally.  Take medicines only as directed by your health care provider.  Contact a health care provider if:  Your leg cramps get more severe or more frequent, or they do not improve over time.  Your foot becomes cold, numb, or blue. This information is not intended to replace advice given to you by your health care provider. Make sure you discuss any questions you have with your health care provider. Document Released: 04/24/2004 Document Revised: 08/23/2015 Document Reviewed: 02/22/2014 Elsevier Interactive Patient Education  2018 Elsevier Inc.  
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
## 2017-06-04 LAB — COMPREHENSIVE METABOLIC PANEL
ALK PHOS: 79 IU/L (ref 39–117)
ALT: 26 IU/L (ref 0–32)
AST: 23 IU/L (ref 0–40)
Albumin/Globulin Ratio: 1.2 (ref 1.2–2.2)
Albumin: 4.1 g/dL (ref 3.5–5.5)
BUN/Creatinine Ratio: 16 (ref 9–23)
BUN: 12 mg/dL (ref 6–24)
CHLORIDE: 102 mmol/L (ref 96–106)
CO2: 24 mmol/L (ref 20–29)
Calcium: 9.4 mg/dL (ref 8.7–10.2)
Creatinine, Ser: 0.77 mg/dL (ref 0.57–1.00)
GFR calc Af Amer: 101 mL/min/{1.73_m2} (ref >59)
GFR calc non Af Amer: 87 mL/min/{1.73_m2} (ref >59)
GLUCOSE: 90 mg/dL (ref 65–99)
Globulin, Total: 3.5 g/dL (ref 1.5–4.5)
Potassium: 4.2 mmol/L (ref 3.5–5.2)
Sodium: 139 mmol/L (ref 134–144)
Total Protein: 7.6 g/dL (ref 6.0–8.5)

## 2017-06-04 LAB — MAGNESIUM: MAGNESIUM: 1.9 mg/dL (ref 1.6–2.3)

## 2017-06-05 ENCOUNTER — Telehealth: Payer: Self-pay | Admitting: *Deleted

## 2017-06-05 NOTE — Telephone Encounter (Signed)
-----   Message from Argentina Donovan, [place] sent at 06/04/2017 11:22 AM EST ----- Your labs look great.  Your potassium, sodium, and magnesium are all normal which are the most common causes of muscle cramping.  Your blood sugar, kidney function, and liver function are also normal which is reassuring.  Drink lots of water, add exercise and stretching as we discussed to minimize your risks of further episodes. Thanks, Freeman Caldron, PA-C

## 2017-06-05 NOTE — Telephone Encounter (Signed)
Medical Assistant left message on patient's home and cell voicemail. Voicemail states to give a call back to [place] with [facility] at [phone]. !!Please inform patient of labs being normal. Patient should drink lots of water and exercise to help minimize further thigh pain!!!

## 2017-06-09 ENCOUNTER — Encounter (HOSPITAL_COMMUNITY): Payer: Self-pay

## 2017-06-09 ENCOUNTER — Encounter: Payer: Self-pay | Admitting: Pulmonary Disease

## 2017-06-09 ENCOUNTER — Other Ambulatory Visit: Payer: Self-pay

## 2017-06-09 ENCOUNTER — Ambulatory Visit (INDEPENDENT_AMBULATORY_CARE_PROVIDER_SITE_OTHER)
Admission: RE | Admit: 2017-06-09 | Discharge: 2017-06-09 | Disposition: A | Discharge status: Home / Self Care | Payer: No Typology Code available for payment source | Source: Ambulatory Visit | Attending: Pulmonary Disease | Admitting: Pulmonary Disease

## 2017-06-09 ENCOUNTER — Ambulatory Visit (INDEPENDENT_AMBULATORY_CARE_PROVIDER_SITE_OTHER): Payer: No Typology Code available for payment source | Admitting: Pulmonary Disease

## 2017-06-09 ENCOUNTER — Emergency Department (HOSPITAL_COMMUNITY)
Admission: EM | Admit: 2017-06-09 | Discharge: 2017-06-10 | Disposition: A | Discharge status: Home / Self Care | Payer: No Typology Code available for payment source | Attending: Emergency Medicine | Admitting: Emergency Medicine

## 2017-06-09 ENCOUNTER — Emergency Department (HOSPITAL_COMMUNITY): Payer: No Typology Code available for payment source

## 2017-06-09 VITALS — BP 136/82 | HR 82 | Ht 64.0 in | Wt 276.4 lb

## 2017-06-09 DIAGNOSIS — I10 Essential (primary) hypertension: Secondary | ICD-10-CM | POA: Diagnosis not present

## 2017-06-09 DIAGNOSIS — Y929 Unspecified place or not applicable: Secondary | ICD-10-CM | POA: Diagnosis not present

## 2017-06-09 DIAGNOSIS — E119 Type 2 diabetes mellitus without complications: Secondary | ICD-10-CM | POA: Diagnosis not present

## 2017-06-09 DIAGNOSIS — Z7984 Long term (current) use of oral hypoglycemic drugs: Secondary | ICD-10-CM | POA: Diagnosis not present

## 2017-06-09 DIAGNOSIS — Z91040 Latex allergy status: Secondary | ICD-10-CM | POA: Diagnosis not present

## 2017-06-09 DIAGNOSIS — Y999 Unspecified external cause status: Secondary | ICD-10-CM | POA: Diagnosis not present

## 2017-06-09 DIAGNOSIS — S0990XA Unspecified injury of head, initial encounter: Secondary | ICD-10-CM | POA: Diagnosis present

## 2017-06-09 DIAGNOSIS — R05 Cough: Secondary | ICD-10-CM

## 2017-06-09 DIAGNOSIS — M791 Myalgia, unspecified site: Secondary | ICD-10-CM | POA: Diagnosis not present

## 2017-06-09 DIAGNOSIS — Y9389 Activity, other specified: Secondary | ICD-10-CM | POA: Insufficient documentation

## 2017-06-09 DIAGNOSIS — M7918 Myalgia, other site: Secondary | ICD-10-CM

## 2017-06-09 DIAGNOSIS — Z7722 Contact with and (suspected) exposure to environmental tobacco smoke (acute) (chronic): Secondary | ICD-10-CM | POA: Diagnosis not present

## 2017-06-09 DIAGNOSIS — R059 Cough, unspecified: Secondary | ICD-10-CM

## 2017-06-09 DIAGNOSIS — Z79899 Other long term (current) drug therapy: Secondary | ICD-10-CM | POA: Diagnosis not present

## 2017-06-09 MED ORDER — AZELASTINE HCL 0.1 % NA SOLN: 2.0000 | Freq: Every day | NASAL | 12 refills | Status: DC

## 2017-06-09 MED ORDER — TETRACAINE HCL 0.5 % OP SOLN
2.0000 [drp] | Freq: Once | OPHTHALMIC | Status: AC
  Administered 2017-06-10: 2 [drp] via OPHTHALMIC
  Filled 2017-06-09: qty 4

## 2017-06-09 MED ORDER — FLUORESCEIN SODIUM 1 MG OP STRP
1.0000 | ORAL_STRIP | Freq: Once | OPHTHALMIC | Status: AC
  Administered 2017-06-10: 1 via OPHTHALMIC
  Filled 2017-06-09: qty 1

## 2017-06-09 MED FILL — AZELASTINE HCL 137 MCG/SPRA: 137 | 30 days supply | Qty: 30 | Fill #0
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
## 2017-06-09 NOTE — Patient Instructions (Addendum)
Make sure that you get Chlor-Trimeton over-the-counter.  Use 2 pills 3 times daily Continue the Flonase nasal spray.  Will call in Astelin nasal spray Continue dexilant We will get CXR  Follow-up in 1-2 months.

## 2017-06-09 NOTE — Addendum Note (Signed)
Addended by: Maryanna Shape A on: 06/09/2017 10:06 AM   Modules accepted: Orders
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
## 2017-06-10 MED ORDER — METHOCARBAMOL 500 MG PO TABS: 500.0000 mg | ORAL_TABLET | Freq: Two times a day (BID) | ORAL | 0 refills | Status: DC

## 2017-06-10 MED ORDER — IBUPROFEN 600 MG PO TABS: 600.0000 mg | ORAL_TABLET | Freq: Four times a day (QID) | ORAL | 0 refills | Status: DC | PRN

## 2017-06-10 NOTE — Discharge Instructions (Signed)
The pain your experiencing is likely due to muscle strain, you may take Ibuprofen and Robaxin as needed for pain management. Do not combine with any pain reliever other than tylenol. The muscle soreness should improve over the next week. Follow up with your family doctor in the next week for a recheck if you are still having symptoms. Return to ED if pain is worsening, you develop weakness or numbness of extremities, or new or concerning symptoms develop.  You were examined today for a head injury and possible concussion.  Your head CT showed no evidence of  Injury today.   Sometimes serious problems can develop after a head injury. Please return to the emergency department if you experience any of the following symptoms: Repeated vomiting Headache that gets worse and does not go away Loss of consciousness or inability to stay awake at times when you   normally would be able to Getting more confused, restless or agitated Convulsions or seizures Difficulty walking or feeling off balance Weakness or numbness Vision changes  A concussion is a very mild traumatic brain injury caused by a bump, jolt or blow to the head, most people recover quickly and fully. You can experience a wide variety of symptoms including:   - Confusion      - Difficulty concentrating       - Trouble remembering new info  - Headache      - Dizziness        - Fuzzy or blurry vision  - Fatigue      - Balance problems      - Light sensitivity  - Mood swings     - Changes in sleep or difficulty sleeping   To help these symptoms improve make sure you are getting plenty of rest, avoid screen time, loud music and strenuous mental activities. Avoid any strenuous physical activities, once your symptoms have resolved a slow and gradual return to activity is recommended. It is very important that you avoid situations in which you might sustain a second head injury as this can be very dangerous and life threatening. You cannot be  medically cleared to return to normal activities until you have followed up with your primary doctor or a concussion specialist for reevaluation.

## 2017-06-25 ENCOUNTER — Ambulatory Visit: Payer: No Typology Code available for payment source | Attending: Internal Medicine | Admitting: Family Medicine

## 2017-06-25 ENCOUNTER — Encounter: Payer: Self-pay | Admitting: Family Medicine

## 2017-06-25 VITALS — BP 122/79 | HR 77 | Temp 98.4°F | Resp 16 | Wt 274.0 lb

## 2017-06-25 DIAGNOSIS — S0552XA Penetrating wound with foreign body of left eyeball, initial encounter: Secondary | ICD-10-CM | POA: Insufficient documentation

## 2017-06-25 DIAGNOSIS — M791 Myalgia, unspecified site: Secondary | ICD-10-CM

## 2017-06-25 DIAGNOSIS — E785 Hyperlipidemia, unspecified: Secondary | ICD-10-CM | POA: Insufficient documentation

## 2017-06-25 DIAGNOSIS — Z888 Allergy status to other drugs, medicaments and biological substances status: Secondary | ICD-10-CM | POA: Insufficient documentation

## 2017-06-25 DIAGNOSIS — M62830 Muscle spasm of back: Secondary | ICD-10-CM | POA: Diagnosis not present

## 2017-06-25 DIAGNOSIS — K219 Gastro-esophageal reflux disease without esophagitis: Secondary | ICD-10-CM | POA: Diagnosis not present

## 2017-06-25 DIAGNOSIS — Z7982 Long term (current) use of aspirin: Secondary | ICD-10-CM | POA: Insufficient documentation

## 2017-06-25 DIAGNOSIS — Z590 Homelessness: Secondary | ICD-10-CM | POA: Insufficient documentation

## 2017-06-25 DIAGNOSIS — Z7984 Long term (current) use of oral hypoglycemic drugs: Secondary | ICD-10-CM | POA: Insufficient documentation

## 2017-06-25 DIAGNOSIS — Z6841 Body Mass Index (BMI) 40.0 and over, adult: Secondary | ICD-10-CM | POA: Diagnosis not present

## 2017-06-25 DIAGNOSIS — Z7722 Contact with and (suspected) exposure to environmental tobacco smoke (acute) (chronic): Secondary | ICD-10-CM | POA: Diagnosis not present

## 2017-06-25 DIAGNOSIS — M069 Rheumatoid arthritis, unspecified: Secondary | ICD-10-CM | POA: Insufficient documentation

## 2017-06-25 DIAGNOSIS — F319 Bipolar disorder, unspecified: Secondary | ICD-10-CM | POA: Diagnosis not present

## 2017-06-25 DIAGNOSIS — I10 Essential (primary) hypertension: Secondary | ICD-10-CM | POA: Diagnosis not present

## 2017-06-25 DIAGNOSIS — G4733 Obstructive sleep apnea (adult) (pediatric): Secondary | ICD-10-CM | POA: Diagnosis not present

## 2017-06-25 DIAGNOSIS — G44209 Tension-type headache, unspecified, not intractable: Secondary | ICD-10-CM | POA: Insufficient documentation

## 2017-06-25 DIAGNOSIS — Z880 Allergy status to penicillin: Secondary | ICD-10-CM | POA: Insufficient documentation

## 2017-06-25 DIAGNOSIS — E119 Type 2 diabetes mellitus without complications: Secondary | ICD-10-CM | POA: Insufficient documentation

## 2017-06-25 DIAGNOSIS — E1169 Type 2 diabetes mellitus with other specified complication: Secondary | ICD-10-CM

## 2017-06-25 DIAGNOSIS — S0990XA Unspecified injury of head, initial encounter: Secondary | ICD-10-CM | POA: Insufficient documentation

## 2017-06-25 DIAGNOSIS — S0551XA Penetrating wound with foreign body of right eyeball, initial encounter: Secondary | ICD-10-CM | POA: Insufficient documentation

## 2017-06-25 DIAGNOSIS — E669 Obesity, unspecified: Secondary | ICD-10-CM

## 2017-06-25 DIAGNOSIS — Z79899 Other long term (current) drug therapy: Secondary | ICD-10-CM | POA: Insufficient documentation

## 2017-06-25 LAB — GLUCOSE, POCT (MANUAL RESULT ENTRY): POC Glucose: 110 mg/dl — AB (ref 70–99)

## 2017-06-25 MED ORDER — NAPROXEN 500 MG PO TABS: 500.0000 mg | ORAL_TABLET | Freq: Two times a day (BID) | ORAL | 0 refills | Status: AC

## 2017-06-25 MED ORDER — RANITIDINE HCL 300 MG PO TABS: 300.0000 mg | ORAL_TABLET | Freq: Every day | ORAL | 5 refills | Status: DC

## 2017-06-25 MED ORDER — CYCLOBENZAPRINE HCL 10 MG PO TABS: 10.0000 mg | ORAL_TABLET | Freq: Three times a day (TID) | ORAL | 0 refills | Status: DC | PRN

## 2017-06-25 MED ORDER — SPIRONOLACTONE 50 MG PO TABS: 50.0000 mg | ORAL_TABLET | Freq: Every day | ORAL | 5 refills | Status: DC

## 2017-06-25 MED FILL — raNITIdine HCL 300 MG TABS: 300 | 30 days supply | Qty: 30 | Fill #0

## 2017-06-25 MED FILL — CYCLOBENZAPRINE 10 MG TAB: 10 | 10 days supply | Qty: 30 | Fill #0

## 2017-06-25 MED FILL — NAPROXEN 500 MG TABLET: 500 | 14 days supply | Qty: 28 | Fill #0
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
## 2017-06-25 NOTE — Patient Instructions (Addendum)
For leg swelling, I recommend wearing compression to improve swelling.   Edema Edema is an abnormal buildup of fluids in your bodytissues. Edema is somewhatdependent on gravity to pull the fluid to the lowest place in your body. That makes the condition more common in the legs and thighs (lower extremities). Painless swelling of the feet and ankles is common and becomes more likely as you get older. It is also common in looser tissues, like around your eyes. When the affected area is squeezed, the fluid may move out of that spot and leave a dent for a few moments. This dent is called pitting. What are the causes? There are many possible causes of edema. Eating too much salt and being on your feet or sitting for a long time can cause edema in your legs and ankles. Hot weather may make edema worse. Common medical causes of edema include:  Heart failure.  Liver disease.  Kidney disease.  Weak blood vessels in your legs.  Cancer.  An injury.  Pregnancy.  Some medications.  Obesity.  What are the signs or symptoms? Edema is usually painless.Your skin may look swollen or shiny. How is this diagnosed? Your health care provider may be able to diagnose edema by asking about your medical history and doing a physical exam. You may need to have tests such as X-rays, an electrocardiogram, or blood tests to check for medical conditions that may cause edema. How is this treated? Edema treatment depends on the cause. If you have heart, liver, or kidney disease, you need the treatment appropriate for these conditions. General treatment may include:  Elevation of the affected body part above the level of your heart.  Compression of the affected body part. Pressure from elastic bandages or support stockings squeezes the tissues and forces fluid back into the blood vessels. This keeps fluid from entering the tissues.  Restriction of fluid and salt intake.  Use of a water pill (diuretic).  These medications are appropriate only for some types of edema. They pull fluid out of your body and make you urinate more often. This gets rid of fluid and reduces swelling, but diuretics can have side effects. Only use diuretics as directed by your health care provider.  Follow these instructions at home:  Keep the affected body part above the level of your heart when you are lying down.  Do not sit still or stand for prolonged periods.  Do not put anything directly under your knees when lying down.  Do not wear constricting clothing or garters on your upper legs.  Exercise your legs to work the fluid back into your blood vessels. This may help the swelling go down.  Wear elastic bandages or support stockings to reduce ankle swelling as directed by your health care provider.  Eat a low-salt diet to reduce fluid if your health care provider recommends it.  Only take medicines as directed by your health care provider. Contact a health care provider if:  Your edema is not responding to treatment.  You have heart, liver, or kidney disease and notice symptoms of edema.  You have edema in your legs that does not improve after elevating them.  You have sudden and unexplained weight gain. Get help right away if:  You develop shortness of breath or chest pain.  You cannot breathe when you lie down.  You develop pain, redness, or warmth in the swollen areas.  You have heart, liver, or kidney disease and suddenly get edema.  You  have a fever and your symptoms suddenly get worse. This information is not intended to replace advice given to you by your health care provider. Make sure you discuss any questions you have with your health care provider. Document Released: 03/17/2005 Document Revised: 08/23/2015 Document Reviewed: 01/07/2013 Elsevier Interactive Patient Education  2017 [place] Headache Without Cause A headache is pain or discomfort felt around the  head or neck area. There are many causes and types of headaches. In some cases, the cause may not be found. Follow these instructions at home: Managing pain  Take over-the-counter and prescription medicines only as told by your doctor.  Lie down in a dark, quiet room when you have a headache.  If directed, apply ice to the head and neck area: ? Put ice in a plastic bag. ? Place a towel between your skin and the bag. ? Leave the ice on for 20 minutes, 2-3 times per day.  Use a heating pad or hot shower to apply heat to the head and neck area as told by your doctor.  Keep lights dim if bright lights bother you or make your headaches worse. Eating and drinking  Eat meals on a regular schedule.  Lessen how much alcohol you drink.  Lessen how much caffeine you drink, or stop drinking caffeine. General instructions  Keep all follow-up visits as told by your doctor. This is important.  Keep a journal to find out if certain things bring on headaches. For example, write down: ? What you eat and drink. ? How much sleep you get. ? Any change to your diet or medicines.  Relax by getting a massage or doing other relaxing activities.  Lessen stress.  Sit up straight. Do not tighten (tense) your muscles.  Do not use tobacco products. This includes cigarettes, chewing tobacco, or e-cigarettes. If you need help quitting, ask your doctor.  Exercise regularly as told by your doctor.  Get enough sleep. This often means 7-9 hours of sleep. Contact a doctor if:  Your symptoms are not helped by medicine.  You have a headache that feels different than the other headaches.  You feel sick to your stomach (nauseous) or you throw up (vomit).  You have a fever. Get help right away if:  Your headache becomes really bad.  You keep throwing up.  You have a stiff neck.  You have trouble seeing.  You have trouble speaking.  You have pain in the eye or ear.  Your muscles are weak or  you lose muscle control.  You lose your balance or have trouble walking.  You feel like you will pass out (faint) or you pass out.  You have confusion. This information is not intended to replace advice given to you by your health care provider. Make sure you discuss any questions you have with your health care provider. Document Released: 12/25/2007 Document Revised: 08/23/2015 Document Reviewed: 07/10/2014 Elsevier Interactive Patient Education  [employer].

## 2017-07-10 ENCOUNTER — Ambulatory Visit: Payer: No Typology Code available for payment source | Admitting: Pulmonary Disease

## 2017-07-20 MED FILL — SPIRONOLACTONE 50 MG TABLET: 50 | 30 days supply | Qty: 30 | Fill #0

## 2017-07-20 MED FILL — LOSARTAN POTASSIUM 100 MG T: 100 | 30 days supply | Qty: 30 | Fill #1

## 2017-07-20 MED FILL — $DEXILANT DR 60 MG CAPSULE: 60 | 30 days supply | Qty: 30 | Fill #1

## 2017-07-20 MED FILL — ?CETIRIZINE HCL 10 MG TABLE: 10 | 30 days supply | Qty: 30 | Fill #7

## 2017-07-20 MED FILL — hydrALAZINE HCL 25 MG TABS: 25 | 30 days supply | Qty: 90 | Fill #5

## 2017-07-20 MED FILL — ?METFOMIN HCL 850MG TABS: 850 | 30 days supply | Qty: 30 | Fill #3

## 2017-07-20 MED FILL — OXYBUTYNIN 5 MG TABLET: 5 | 30 days supply | Qty: 60 | Fill #2

## 2017-07-21 ENCOUNTER — Ambulatory Visit (HOSPITAL_COMMUNITY): Payer: No Typology Code available for payment source

## 2017-07-21 ENCOUNTER — Ambulatory Visit: Payer: No Typology Code available for payment source

## 2017-07-21 ENCOUNTER — Ambulatory Visit: Payer: No Typology Code available for payment source | Admitting: Pulmonary Disease

## 2017-07-28 ENCOUNTER — Encounter: Payer: Self-pay | Admitting: Pulmonary Disease

## 2017-07-28 ENCOUNTER — Ambulatory Visit (INDEPENDENT_AMBULATORY_CARE_PROVIDER_SITE_OTHER): Payer: No Typology Code available for payment source | Admitting: Pulmonary Disease

## 2017-07-28 VITALS — BP 128/82 | HR 70 | Ht 64.0 in | Wt 274.0 lb

## 2017-07-28 DIAGNOSIS — R059 Cough, unspecified: Secondary | ICD-10-CM

## 2017-07-28 DIAGNOSIS — R05 Cough: Secondary | ICD-10-CM
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
## 2017-07-28 NOTE — Patient Instructions (Signed)
Continue using the allergy medication, nasal spray and antiacid medication as you are doing now I am glad that the cough is slightly better this week I will follow back with you in 6 months time.

## 2017-08-12 ENCOUNTER — Other Ambulatory Visit: Payer: Self-pay | Admitting: Family Medicine

## 2017-08-31 MED FILL — metFORMIN HCL 850 MG TABS: 850 | 30 days supply | Qty: 30 | Fill #4

## 2017-08-31 MED FILL — raNITIdine HCL 300 MG TABS: 300 | 30 days supply | Qty: 30 | Fill #1

## 2017-08-31 MED FILL — CYCLOBENZAPRINE 10 MG TAB: 10 | 10 days supply | Qty: 30 | Fill #0

## 2017-09-02 ENCOUNTER — Other Ambulatory Visit: Payer: Self-pay | Admitting: Internal Medicine

## 2017-09-02 DIAGNOSIS — R059 Cough, unspecified: Secondary | ICD-10-CM

## 2017-09-02 DIAGNOSIS — R05 Cough: Secondary | ICD-10-CM

## 2017-09-02 MED ORDER — CETIRIZINE HCL 10 MG PO TABS: 10.0000 mg | ORAL_TABLET | Freq: Every day | ORAL | 3 refills | Status: DC

## 2017-09-02 MED FILL — ?CETIRIZINE HCL 10 MG TABLE: 10 | 30 days supply | Qty: 30 | Fill #0

## 2017-09-03 ENCOUNTER — Ambulatory Visit: Payer: Self-pay | Attending: Family Medicine | Admitting: Physician Assistant

## 2017-09-03 VITALS — BP 117/79 | HR 89 | Temp 98.7°F | Resp 18 | Ht 64.0 in | Wt 265.0 lb

## 2017-09-03 DIAGNOSIS — Z6841 Body Mass Index (BMI) 40.0 and over, adult: Secondary | ICD-10-CM | POA: Insufficient documentation

## 2017-09-03 DIAGNOSIS — G8929 Other chronic pain: Secondary | ICD-10-CM | POA: Insufficient documentation

## 2017-09-03 DIAGNOSIS — Z7984 Long term (current) use of oral hypoglycemic drugs: Secondary | ICD-10-CM | POA: Insufficient documentation

## 2017-09-03 DIAGNOSIS — E785 Hyperlipidemia, unspecified: Secondary | ICD-10-CM | POA: Insufficient documentation

## 2017-09-03 DIAGNOSIS — R451 Restlessness and agitation: Secondary | ICD-10-CM

## 2017-09-03 DIAGNOSIS — Z7982 Long term (current) use of aspirin: Secondary | ICD-10-CM | POA: Insufficient documentation

## 2017-09-03 DIAGNOSIS — B079 Viral wart, unspecified: Secondary | ICD-10-CM | POA: Insufficient documentation

## 2017-09-03 DIAGNOSIS — M545 Low back pain: Secondary | ICD-10-CM | POA: Insufficient documentation

## 2017-09-03 DIAGNOSIS — E1169 Type 2 diabetes mellitus with other specified complication: Secondary | ICD-10-CM

## 2017-09-03 DIAGNOSIS — Z79899 Other long term (current) drug therapy: Secondary | ICD-10-CM | POA: Insufficient documentation

## 2017-09-03 DIAGNOSIS — E119 Type 2 diabetes mellitus without complications: Secondary | ICD-10-CM | POA: Insufficient documentation

## 2017-09-03 DIAGNOSIS — F319 Bipolar disorder, unspecified: Secondary | ICD-10-CM | POA: Insufficient documentation

## 2017-09-03 DIAGNOSIS — M069 Rheumatoid arthritis, unspecified: Secondary | ICD-10-CM | POA: Insufficient documentation

## 2017-09-03 DIAGNOSIS — I10 Essential (primary) hypertension: Secondary | ICD-10-CM | POA: Insufficient documentation

## 2017-09-03 DIAGNOSIS — M62838 Other muscle spasm: Secondary | ICD-10-CM | POA: Insufficient documentation

## 2017-09-03 DIAGNOSIS — E669 Obesity, unspecified: Secondary | ICD-10-CM

## 2017-09-03 LAB — GLUCOSE, POCT (MANUAL RESULT ENTRY): POC Glucose: 118 mg/dl — AB (ref 70–99)

## 2017-09-03 MED ORDER — IBUPROFEN 600 MG PO TABS: 600.0000 mg | ORAL_TABLET | Freq: Four times a day (QID) | ORAL | 0 refills | Status: DC | PRN

## 2017-09-03 MED ORDER — CYCLOBENZAPRINE HCL 10 MG PO TABS: ORAL_TABLET | ORAL | 0 refills | Status: DC

## 2017-09-03 MED FILL — IBUPROFEN 600 MG TABLET: 600 | 15 days supply | Qty: 60 | Fill #0

## 2017-09-03 MED FILL — CYCLOBENZAPRINE 10 MG TAB: 10 | 20 days supply | Qty: 60 | Fill #0
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
## 2017-09-03 NOTE — Patient Instructions (Signed)
Call your psychiatrist to discuss restlessness and psychiatric medications

## 2017-09-04 ENCOUNTER — Telehealth: Payer: Self-pay | Admitting: *Deleted

## 2017-09-04 ENCOUNTER — Other Ambulatory Visit: Payer: Self-pay | Admitting: Physician Assistant

## 2017-09-04 LAB — VITAMIN D 25 HYDROXY (VIT D DEFICIENCY, FRACTURES): VIT D 25 HYDROXY: 13.4 ng/mL — AB (ref 30.0–100.0)

## 2017-09-04 MED ORDER — VITAMIN D (ERGOCALCIFEROL) 1.25 MG (50000 UNIT) PO CAPS: 50000.0000 [IU] | ORAL_CAPSULE | ORAL | 0 refills | Status: DC

## 2017-09-04 MED FILL — VIT D2 1.25 MG (50,000 UNIT: 1.25 MG | 28 days supply | Qty: 4 | Fill #0

## 2017-09-04 NOTE — Telephone Encounter (Signed)
-----   Message from [place] Donovan, [place] sent at 09/04/2017  2:12 PM EDT ----- Your vitamin D is VERY low.  This can contribute to muscle aches, anxiety, fatigue, and depression.  I have sent a prescription to the pharmacy for you to take once a week.  We will recheck this level in 3-4 months.   Thanks, Freeman Caldron, PA-C

## 2017-09-04 NOTE — Telephone Encounter (Signed)
Patient verified DOB Patient is aware of vitamin d level being very low and this attributing to muscle aches, fatigue and anxiety. Patient is aware of script being sent to [facility] for pickup. No further questions at this time.

## 2017-09-08 ENCOUNTER — Other Ambulatory Visit: Payer: Self-pay | Admitting: Obstetrics and Gynecology

## 2017-09-08 DIAGNOSIS — Z1231 Encounter for screening mammogram for malignant neoplasm of breast: Secondary | ICD-10-CM

## 2017-09-10 ENCOUNTER — Encounter (HOSPITAL_COMMUNITY): Payer: Self-pay

## 2017-09-10 ENCOUNTER — Ambulatory Visit (HOSPITAL_COMMUNITY)
Admission: RE | Admit: 2017-09-10 | Discharge: 2017-09-10 | Disposition: A | Discharge status: Home / Self Care | Payer: Self-pay | Source: Ambulatory Visit | Attending: Obstetrics and Gynecology | Admitting: Obstetrics and Gynecology

## 2017-09-10 ENCOUNTER — Ambulatory Visit: Payer: Self-pay | Admitting: Internal Medicine

## 2017-09-10 ENCOUNTER — Ambulatory Visit
Admission: RE | Admit: 2017-09-10 | Discharge: 2017-09-10 | Disposition: A | Discharge status: Home / Self Care | Payer: No Typology Code available for payment source | Source: Ambulatory Visit | Attending: Obstetrics and Gynecology | Admitting: Obstetrics and Gynecology

## 2017-09-10 VITALS — BP 124/82 | Ht 64.0 in

## 2017-09-10 DIAGNOSIS — Z1231 Encounter for screening mammogram for malignant neoplasm of breast: Secondary | ICD-10-CM

## 2017-09-10 DIAGNOSIS — Z01419 Encounter for gynecological examination (general) (routine) without abnormal findings: Secondary | ICD-10-CM

## 2017-09-10 MED FILL — $DEXILANT DR 60 MG CAPSULE: 60 | 30 days supply | Qty: 30 | Fill #2

## 2017-09-10 NOTE — Progress Notes (Signed)
No complaints today.   Pap Smear: Pap smear completed today. Last Pap smear was 02/27/2014 at [facility] and normal. Per patient has no history of an abnormal Pap smear. Last Pap smear result is in Epic.  Physical exam: Breasts Breasts symmetrical. No skin abnormalities bilateral breasts. No nipple retraction bilateral breasts. No nipple discharge bilateral breasts. No lymphadenopathy. No lumps palpated bilateral breasts. No complaints of pain or tenderness on exam. Referred patient to the [facility] for a screening mammogram. Appointment scheduled for Thursday, September 10, 2017 at 1040.        Pelvic/Bimanual   Ext Genitalia No lesions, no swelling and no discharge observed on external genitalia.         Vagina Vagina pink and normal texture. No lesions or discharge observed in vagina.          Cervix Cervix is present. Cervix pink and of normal texture. Cervical polyp observed at cervical os. No discharge observed. Will refer to the [facility] [place] at [facility] for follow-up.    Uterus Uterus is present and palpable. Uterus in normal position and normal size.        Adnexae Bilateral ovaries present and palpable. No tenderness on palpation.         Rectovaginal No rectal exam completed today since patient had no rectal complaints. No skin abnormalities observed on exam.    Smoking History: Patient has never smoked.  Patient Navigation: Patient education provided. Access to services provided for patient through [place] program.   Colorectal Cancer Screening: Patient had a colonoscopy completed 08/08/2015. No complaints today. FIT Test given to patient to complete and return to [facility].  Breast and Cervical Cancer Risk Assessment: Per patient has a family history of 2 maternal aunts having breast cancer. Patient has no known genetic mutations or history of radiation treatment to the chest before age 4. Patient has  no history of cervical dysplasia, immunocompromised, or DES exposure in-utero. Patient has a 5-year risk for breast cancer at 1.4% and a lifetime risk at 7.8%.

## 2017-09-10 NOTE — Addendum Note (Signed)
Encounter addended by: Armond Hang, LPN on: 0/14/1030 13:14 PM  Actions taken: Order list changed

## 2017-09-10 NOTE — Patient Instructions (Signed)
Explained breast self awareness with Sabrina [name]. Let patient know [facility] will cover Pap smears and HPV typing every 5 years unless has a history of abnormal Pap smears. Referred patient to the [facility] [facility] for a screening mammogram. Appointment scheduled for Thursday, September 10, 2017 at 1040.  Let patient know will follow up with her within the next couple weeks with results of Pap smear by letter or phone. Informed patient that the [facility] Center will follow-up with her within the next couple of weeks with results of her mammogram by letter or phone. Will refer patient to the [facility] [place] for a cervical polyp. Let patient know that [facility] will not cover the follow-up appointment and that can complete financial assistance paperwork since she doesn't have insurance. Told patient that someone from the [facility] [place] should call her with appointment. Sabrina [name] verbalized understanding.  [place] Fant, Arvil [place], RN 10:39 AM

## 2017-09-11 ENCOUNTER — Other Ambulatory Visit: Payer: Self-pay

## 2017-09-11 ENCOUNTER — Emergency Department (HOSPITAL_COMMUNITY)
Admission: EM | Admit: 2017-09-11 | Discharge: 2017-09-12 | Disposition: A | Discharge status: Home / Self Care | Payer: Self-pay | Attending: Emergency Medicine | Admitting: Emergency Medicine

## 2017-09-11 ENCOUNTER — Encounter (HOSPITAL_COMMUNITY): Payer: Self-pay | Admitting: Emergency Medicine

## 2017-09-11 ENCOUNTER — Emergency Department (HOSPITAL_COMMUNITY): Payer: Self-pay

## 2017-09-11 DIAGNOSIS — R0602 Shortness of breath: Secondary | ICD-10-CM | POA: Insufficient documentation

## 2017-09-11 DIAGNOSIS — Z7984 Long term (current) use of oral hypoglycemic drugs: Secondary | ICD-10-CM | POA: Insufficient documentation

## 2017-09-11 DIAGNOSIS — I10 Essential (primary) hypertension: Secondary | ICD-10-CM | POA: Insufficient documentation

## 2017-09-11 DIAGNOSIS — Z79899 Other long term (current) drug therapy: Secondary | ICD-10-CM | POA: Insufficient documentation

## 2017-09-11 DIAGNOSIS — R079 Chest pain, unspecified: Secondary | ICD-10-CM | POA: Insufficient documentation

## 2017-09-11 DIAGNOSIS — Z91040 Latex allergy status: Secondary | ICD-10-CM | POA: Insufficient documentation

## 2017-09-11 DIAGNOSIS — Z7982 Long term (current) use of aspirin: Secondary | ICD-10-CM | POA: Insufficient documentation

## 2017-09-11 DIAGNOSIS — E119 Type 2 diabetes mellitus without complications: Secondary | ICD-10-CM | POA: Insufficient documentation

## 2017-09-11 DIAGNOSIS — Z7722 Contact with and (suspected) exposure to environmental tobacco smoke (acute) (chronic): Secondary | ICD-10-CM | POA: Insufficient documentation

## 2017-09-11 LAB — I-STAT BETA HCG BLOOD, ED (MC, WL, AP ONLY): I-stat hCG, quantitative: <5 m[IU]/mL (ref <5)

## 2017-09-11 LAB — CYTOLOGY - PAP
DIAGNOSIS: NEGATIVE
HPV (WINDOPATH): NOT DETECTED

## 2017-09-11 LAB — BASIC METABOLIC PANEL
Anion gap: 10 (ref 5–15)
BUN: 18 mg/dL (ref 6–20)
CALCIUM: 9.4 mg/dL (ref 8.9–10.3)
CO2: 22 mmol/L (ref 22–32)
CREATININE: 1.29 mg/dL — AB (ref 0.44–1.00)
Chloride: 104 mmol/L (ref 101–111)
GFR calc Af Amer: 53 mL/min — ABNORMAL LOW (ref >60)
GFR calc non Af Amer: 46 mL/min — ABNORMAL LOW (ref >60)
GLUCOSE: 122 mg/dL — AB (ref 65–99)
Potassium: 4 mmol/L (ref 3.5–5.1)
Sodium: 136 mmol/L (ref 135–145)

## 2017-09-11 LAB — CBC
HCT: 37.9 % (ref 36.0–46.0)
HEMOGLOBIN: 12.4 g/dL (ref 12.0–15.0)
MCH: 32 pg (ref 26.0–34.0)
MCHC: 32.7 g/dL (ref 30.0–36.0)
MCV: 97.7 fL (ref 78.0–100.0)
Platelets: 380 10*3/uL (ref 150–400)
RBC: 3.88 MIL/uL (ref 3.87–5.11)
RDW: 13.2 % (ref 11.5–15.5)
WBC: 10 10*3/uL (ref 4.0–10.5)

## 2017-09-11 LAB — I-STAT TROPONIN, ED: TROPONIN I, POC: 0 ng/mL (ref 0.00–0.08)

## 2017-09-11 LAB — BRAIN NATRIURETIC PEPTIDE: B NATRIURETIC PEPTIDE 5: 20.8 pg/mL (ref 0.0–100.0)

## 2017-09-11 NOTE — ED Provider Notes (Signed)
Patient placed in Quick Look pathway, seen and evaluated   Chief Complaint: shortness of breath  HPI:   Presents with complaint of having episodes of chest tightness and shortness of breath beginning around 3:30 PM today.  Episodes last for approximately 5 minutes and then resolve spontaneously.  They occur while at rest or with mild exertion.  Denies dizziness, cough, nausea/vomiting, diarrhea, abdominal pain, LOC.  States she was previously anticoagulated, however, has not taken her medication for the last few days because she has not been able to get to the pharmacy and has run out.  She does endorse peripheral edema, however, states this is chronic and has not worsened.  She also notes that she has swelling that is greater in the right lower extremity than the left, but this has been unchanged for several years.  Denies pain in the lower extremities.  ROS: Shortness of breath  Physical Exam:   Gen: No distress  Neuro: Awake and Alert  Skin: Warm    Focused Exam:   No diaphoresis.  No pallor.  Pulmonary: No increased work of breathing.  Speaks in full sentences without difficulty.  Lung sounds clear.  No tachypnea.  Cardiac: Normal rate and regular. Peripheral pulses intact.  MSK: Edema to the bilateral lower extremities, worse on the right.  No pain, increased warmth, tenderness, or erythema.   Initiation of care has begun. The patient has been counseled on the process, plan, and necessity for staying for the completion/evaluation, and the remainder of the medical screening examination   Layla Maw 09/11/17 1826    Lacretia Leigh, MD 09/13/17 [phone]

## 2017-09-11 NOTE — ED Triage Notes (Signed)
Patient to ED c/o recurrent chest tightness and SOB x 2 hours, reports hx of same, but this time, it keeps happening in "attacks." SOB worse with exertion, but also happens while patient sitting in chair. During triage, patient's resp e/u and patient resting comfortably in chair and then she starts gasping, taking deep breaths and sits up in chair, stating "it's happening again." Patient asked to sit back and rest, she does and then it stops. Patient currently in no apparent distress.

## 2017-09-12 LAB — GLUCOSE, POCT (MANUAL RESULT ENTRY): POC Glucose: 107 mg/dl — AB (ref 70–99)

## 2017-09-12 NOTE — Discharge Instructions (Signed)
Please follow-up with your primary care doctor. °Return here for any new/acute changes. °
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
## 2017-09-18 ENCOUNTER — Encounter (HOSPITAL_COMMUNITY): Payer: Self-pay | Admitting: *Deleted

## 2017-09-18 MED FILL — SPIRONOLACTONE 50 MG TABLET: 50 | 30 days supply | Qty: 30 | Fill #1

## 2017-09-21 ENCOUNTER — Other Ambulatory Visit: Payer: Self-pay | Admitting: Internal Medicine

## 2017-09-21 DIAGNOSIS — N393 Stress incontinence (female) (male): Secondary | ICD-10-CM

## 2017-09-23 MED FILL — OXYBUTYNIN 5 MG TABLET: 5 | 30 days supply | Qty: 60 | Fill #0

## 2017-09-25 ENCOUNTER — Ambulatory Visit: Payer: Self-pay | Attending: Internal Medicine

## 2017-09-28 ENCOUNTER — Encounter: Payer: Self-pay | Admitting: Internal Medicine

## 2017-09-28 ENCOUNTER — Ambulatory Visit: Payer: Self-pay | Attending: Internal Medicine | Admitting: Internal Medicine

## 2017-09-28 VITALS — BP 126/84 | HR 84 | Temp 98.5°F | Resp 16 | Wt 264.0 lb

## 2017-09-28 DIAGNOSIS — M069 Rheumatoid arthritis, unspecified: Secondary | ICD-10-CM | POA: Insufficient documentation

## 2017-09-28 DIAGNOSIS — N393 Stress incontinence (female) (male): Secondary | ICD-10-CM | POA: Insufficient documentation

## 2017-09-28 DIAGNOSIS — Z885 Allergy status to narcotic agent status: Secondary | ICD-10-CM | POA: Insufficient documentation

## 2017-09-28 DIAGNOSIS — E119 Type 2 diabetes mellitus without complications: Secondary | ICD-10-CM | POA: Insufficient documentation

## 2017-09-28 DIAGNOSIS — Z818 Family history of other mental and behavioral disorders: Secondary | ICD-10-CM | POA: Insufficient documentation

## 2017-09-28 DIAGNOSIS — Z833 Family history of diabetes mellitus: Secondary | ICD-10-CM | POA: Insufficient documentation

## 2017-09-28 DIAGNOSIS — Z880 Allergy status to penicillin: Secondary | ICD-10-CM | POA: Insufficient documentation

## 2017-09-28 DIAGNOSIS — G4733 Obstructive sleep apnea (adult) (pediatric): Secondary | ICD-10-CM | POA: Insufficient documentation

## 2017-09-28 DIAGNOSIS — Z820 Family history of epilepsy and other diseases of the nervous system: Secondary | ICD-10-CM | POA: Insufficient documentation

## 2017-09-28 DIAGNOSIS — T50905A Adverse effect of unspecified drugs, medicaments and biological substances, initial encounter: Secondary | ICD-10-CM | POA: Insufficient documentation

## 2017-09-28 DIAGNOSIS — K219 Gastro-esophageal reflux disease without esophagitis: Secondary | ICD-10-CM | POA: Insufficient documentation

## 2017-09-28 DIAGNOSIS — F319 Bipolar disorder, unspecified: Secondary | ICD-10-CM | POA: Insufficient documentation

## 2017-09-28 DIAGNOSIS — G8929 Other chronic pain: Secondary | ICD-10-CM | POA: Insufficient documentation

## 2017-09-28 DIAGNOSIS — Z7982 Long term (current) use of aspirin: Secondary | ICD-10-CM | POA: Insufficient documentation

## 2017-09-28 DIAGNOSIS — E785 Hyperlipidemia, unspecified: Secondary | ICD-10-CM | POA: Insufficient documentation

## 2017-09-28 DIAGNOSIS — Z6841 Body Mass Index (BMI) 40.0 and over, adult: Secondary | ICD-10-CM | POA: Insufficient documentation

## 2017-09-28 DIAGNOSIS — E559 Vitamin D deficiency, unspecified: Secondary | ICD-10-CM | POA: Insufficient documentation

## 2017-09-28 DIAGNOSIS — Z9841 Cataract extraction status, right eye: Secondary | ICD-10-CM | POA: Insufficient documentation

## 2017-09-28 DIAGNOSIS — E1169 Type 2 diabetes mellitus with other specified complication: Secondary | ICD-10-CM

## 2017-09-28 DIAGNOSIS — Z8249 Family history of ischemic heart disease and other diseases of the circulatory system: Secondary | ICD-10-CM | POA: Insufficient documentation

## 2017-09-28 DIAGNOSIS — K5903 Drug induced constipation: Secondary | ICD-10-CM | POA: Insufficient documentation

## 2017-09-28 DIAGNOSIS — Z888 Allergy status to other drugs, medicaments and biological substances status: Secondary | ICD-10-CM | POA: Insufficient documentation

## 2017-09-28 DIAGNOSIS — Z79899 Other long term (current) drug therapy: Secondary | ICD-10-CM | POA: Insufficient documentation

## 2017-09-28 DIAGNOSIS — N289 Disorder of kidney and ureter, unspecified: Secondary | ICD-10-CM | POA: Insufficient documentation

## 2017-09-28 DIAGNOSIS — Z590 Homelessness: Secondary | ICD-10-CM | POA: Insufficient documentation

## 2017-09-28 DIAGNOSIS — M545 Low back pain: Secondary | ICD-10-CM | POA: Insufficient documentation

## 2017-09-28 DIAGNOSIS — I10 Essential (primary) hypertension: Secondary | ICD-10-CM | POA: Insufficient documentation

## 2017-09-28 DIAGNOSIS — J45909 Unspecified asthma, uncomplicated: Secondary | ICD-10-CM | POA: Insufficient documentation

## 2017-09-28 DIAGNOSIS — E669 Obesity, unspecified: Secondary | ICD-10-CM | POA: Insufficient documentation

## 2017-09-28 DIAGNOSIS — Z91040 Latex allergy status: Secondary | ICD-10-CM | POA: Insufficient documentation

## 2017-09-28 LAB — POCT GLYCOSYLATED HEMOGLOBIN (HGB A1C): Hemoglobin A1C: 5.8 % — AB (ref 4.0–5.6)

## 2017-09-28 LAB — GLUCOSE, POCT (MANUAL RESULT ENTRY): POC GLUCOSE: 124 mg/dL — AB (ref 70–99)

## 2017-09-28 MED ORDER — LOSARTAN POTASSIUM 100 MG PO TABS: 100.0000 mg | ORAL_TABLET | Freq: Every evening | ORAL | 5 refills | Status: DC

## 2017-09-28 MED ORDER — POLYETHYLENE GLYCOL 3350 17 G PO PACK: 17.0000 g | PACK | Freq: Every day | ORAL | 1 refills | Status: DC | PRN

## 2017-09-28 MED ORDER — METFORMIN HCL 850 MG PO TABS: 850.0000 mg | ORAL_TABLET | Freq: Every morning | ORAL | 5 refills | Status: DC

## 2017-09-28 MED ORDER — HYDRALAZINE HCL 25 MG PO TABS: 25.0000 mg | ORAL_TABLET | Freq: Three times a day (TID) | ORAL | 5 refills | Status: DC

## 2017-09-28 MED FILL — POLYETHYLENE GLYCOL 3350 PO: 14 days supply | Qty: 238 | Fill #0

## 2017-09-28 MED FILL — LOSARTAN POTASSIUM 100 MG T: 100 | 30 days supply | Qty: 30 | Fill #0

## 2017-09-28 MED FILL — ?HYDRALAZINE 25MG TAB: 25 | 30 days supply | Qty: 90 | Fill #0

## 2017-09-28 MED FILL — ?METFORMIN HCL 850 MG TABLE: 850 | 30 days supply | Qty: 30 | Fill #0

## 2017-09-28 NOTE — Progress Notes (Signed)
Pt states she is having some discomfort in her right pelvic area  Pt states she is very constipated

## 2017-09-28 NOTE — Patient Instructions (Signed)
Stop Ibuprofen.  Avoid taking Aleve, Advil, Naprosyn as these can make kidney function worse.  Okay to use Tylenol.  Use Miralax as needed for constipation.   Constipation, Adult Constipation is when a person:  Poops (has a bowel movement) fewer times in a week than normal.  Has a hard time pooping.  Has poop that is dry, hard, or bigger than normal.  Follow these instructions at home: Eating and drinking   Eat foods that have a lot of fiber, such as: ? Fresh fruits and vegetables. ? Whole grains. ? Beans.  Eat less of foods that are high in fat, low in fiber, or overly processed, such as: ? [place] fries. ? Hamburgers. ? Cookies. ? Candy. ? Soda.  Drink enough fluid to keep your pee (urine) clear or pale yellow. General instructions  Exercise regularly or as told by your doctor.  Go to the restroom when you feel like you need to poop. Do not hold it in.  Take over-the-counter and prescription medicines only as told by your doctor. These include any fiber supplements.  Do pelvic floor retraining exercises, such as: ? Doing deep breathing while relaxing your lower belly (abdomen). ? Relaxing your pelvic floor while pooping.  Watch your condition for any changes.  Keep all follow-up visits as told by your doctor. This is important. Contact a doctor if:  You have pain that gets worse.  You have a fever.  You have not pooped for 4 days.  You throw up (vomit).  You are not hungry.  You lose weight.  You are bleeding from the anus.  You have thin, pencil-like poop (stool). Get help right away if:  You have a fever, and your symptoms suddenly get worse.  You leak poop or have blood in your poop.  Your belly feels hard or bigger than normal (is bloated).  You have very bad belly pain.  You feel dizzy or you faint. This information is not intended to replace advice given to you by your health care provider. Make sure you discuss any questions you have  with your health care provider. Document Released: 09/03/2007 Document Revised: 10/05/2015 Document Reviewed: 09/05/2015 Elsevier Interactive Patient Education  2018 [employer].
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
## 2017-09-30 ENCOUNTER — Other Ambulatory Visit: Payer: Self-pay

## 2017-10-04 ENCOUNTER — Emergency Department (HOSPITAL_COMMUNITY)
Admission: EM | Admit: 2017-10-04 | Discharge: 2017-10-05 | Disposition: A | Discharge status: Home / Self Care | Payer: No Typology Code available for payment source | Attending: Emergency Medicine | Admitting: Emergency Medicine

## 2017-10-04 ENCOUNTER — Other Ambulatory Visit: Payer: Self-pay

## 2017-10-04 ENCOUNTER — Encounter (HOSPITAL_COMMUNITY): Payer: Self-pay | Admitting: *Deleted

## 2017-10-04 DIAGNOSIS — Z79899 Other long term (current) drug therapy: Secondary | ICD-10-CM | POA: Insufficient documentation

## 2017-10-04 DIAGNOSIS — E119 Type 2 diabetes mellitus without complications: Secondary | ICD-10-CM | POA: Insufficient documentation

## 2017-10-04 DIAGNOSIS — I10 Essential (primary) hypertension: Secondary | ICD-10-CM | POA: Insufficient documentation

## 2017-10-04 DIAGNOSIS — Z7722 Contact with and (suspected) exposure to environmental tobacco smoke (acute) (chronic): Secondary | ICD-10-CM | POA: Insufficient documentation

## 2017-10-04 DIAGNOSIS — Z91040 Latex allergy status: Secondary | ICD-10-CM | POA: Insufficient documentation

## 2017-10-04 DIAGNOSIS — G2402 Drug induced acute dystonia: Secondary | ICD-10-CM | POA: Insufficient documentation

## 2017-10-04 DIAGNOSIS — Z7982 Long term (current) use of aspirin: Secondary | ICD-10-CM | POA: Insufficient documentation

## 2017-10-04 DIAGNOSIS — Z7984 Long term (current) use of oral hypoglycemic drugs: Secondary | ICD-10-CM | POA: Insufficient documentation

## 2017-10-04 LAB — URINALYSIS, ROUTINE W REFLEX MICROSCOPIC
Bilirubin Urine: NEGATIVE
GLUCOSE, UA: NEGATIVE mg/dL
HGB URINE DIPSTICK: NEGATIVE
Ketones, ur: NEGATIVE mg/dL
Leukocytes, UA: NEGATIVE
Nitrite: NEGATIVE
PROTEIN: NEGATIVE mg/dL
Specific Gravity, Urine: 1.003 — ABNORMAL LOW (ref 1.005–1.030)
pH: 6 (ref 5.0–8.0)

## 2017-10-04 MED ORDER — DIPHENHYDRAMINE HCL 50 MG/ML IJ SOLN
25.0000 mg | Freq: Once | INTRAMUSCULAR | Status: AC
  Administered 2017-10-05: 25 mg via INTRAMUSCULAR
  Filled 2017-10-04: qty 1

## 2017-10-04 NOTE — ED Triage Notes (Signed)
Pt says that she has been feeling "restless". Pt says that she has been on various psych medications, but it is just not working. She says that she just cannot sit still and when she tries to lie down to sleep she jumps right up. Feels anxious, has odorous urine, and sweating a lot for 3 days. She says her DBP was 100 today. Denies SI
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
## 2017-10-04 NOTE — ED Triage Notes (Signed)
Pt ambulatory with steady gait to the restroom, once in the restroom, pt says "its happening again" appears to have bouncing motion on both her feet then proceeded to continue walking. Unable to urinate in triage, given urine cup for specimen when able.

## 2017-10-05 MED FILL — VIT D2 1.25 MG (50,000 UNIT: 1.25 MG | 28 days supply | Qty: 4 | Fill #1

## 2017-10-05 NOTE — Discharge Instructions (Addendum)
Please follow-up with your PCP

## 2017-10-06 LAB — FECAL OCCULT BLOOD, IMMUNOCHEMICAL: FECAL OCCULT BLD: NEGATIVE

## 2017-10-07 ENCOUNTER — Other Ambulatory Visit (HOSPITAL_COMMUNITY): Payer: Self-pay | Admitting: *Deleted

## 2017-10-07 ENCOUNTER — Encounter (HOSPITAL_COMMUNITY): Payer: Self-pay | Admitting: *Deleted

## 2017-10-07 DIAGNOSIS — Z0000 Encounter for general adult medical examination without abnormal findings: Secondary | ICD-10-CM

## 2017-10-07 NOTE — Progress Notes (Signed)
Letter mailed to patient with negative Fit Test results.  

## 2017-10-07 NOTE — Progress Notes (Signed)
Letter mailed to patient with negative pap smear results. HPV was negative. Next pap smear due in five years. 

## 2017-10-08 NOTE — Addendum Note (Signed)
Addended by: Ena Dawley on: 10/08/2017 01:10 PM   Modules accepted: Orders

## 2017-10-08 NOTE — Addendum Note (Signed)
Addended by: Ena Dawley on: 10/08/2017 01:09 PM   Modules accepted: Orders

## 2017-10-09 ENCOUNTER — Inpatient Hospital Stay: Payer: No Typology Code available for payment source

## 2017-10-09 ENCOUNTER — Inpatient Hospital Stay: Payer: No Typology Code available for payment source | Attending: Obstetrics and Gynecology | Admitting: *Deleted

## 2017-10-09 VITALS — BP 138/82 | Ht 64.0 in | Wt 254.4 lb

## 2017-10-09 DIAGNOSIS — Z0000 Encounter for general adult medical examination without abnormal findings: Secondary | ICD-10-CM

## 2017-10-09 LAB — LIPID PANEL
Cholesterol: 194 mg/dL (ref 0–200)
HDL: 110 mg/dL (ref >40)
LDL CALC: 65 mg/dL (ref 0–99)
Total CHOL/HDL Ratio: 1.8 RATIO
Triglycerides: 97 mg/dL (ref <150)
VLDL: 19 mg/dL (ref 0–40)

## 2017-10-09 LAB — HEMOGLOBIN A1C
Hgb A1c MFr Bld: 6.2 % — ABNORMAL HIGH (ref 4.8–5.6)
MEAN PLASMA GLUCOSE: 131.24 mg/dL

## 2017-10-09 NOTE — Progress Notes (Signed)
[facility] initial screening  Clinical Measurement:  Height:  64in Weight: 254.4lb  Blood Pressure: 140/90 Blood Pressure #2: 138/82  Fasting Labs Drawn Today, will review with patient when they result.  Medical History:  Patient states that she has been diagnosed with high cholesterol, high blood pressure and  diabetes but not heart disease.  Medications:  Patients states she is not taking any medications for high cholesterol.  Patient states she takes medicine for  high blood pressure or diabetes.  She is taking aspirin daily to prevent heart attack or stroke.    Blood pressure, self measurement:  Patients states she does not measure blood pressure at home.    Nutrition:  Patient states she eats 1 cup of fruit and 3 cups of vegetables in an average day.  Patient states she does not eat fish regularly, she eats more than half a serving of whole grains daily. She drinks less than 36 ounces of beverages with added sugar weekly.  She is currently watching her sodium intake.  She has not had any drinks containing alcohol in the last seven days.    Physical activity:  Patient states that she gets 105 minutes of moderate exercise in a week.  She gets 0 minutes of vigorous exercise per week.    Smoking status:  Patient states she has never smoked and is not around any smokers.    Quality of life:  Patient states that she has had 5 bad physical days out of the last 30 days. In the last 2 weeks, she has had several  days that she has felt down or depressed. She has had 0 days in the last 2 weeks that she has had little interest or pleasure in doing things.  Risk reduction and counseling:  Patient states she wants to lose weight and increase fruit and vegetable intake.  I encouraged her to increase her  current exercise regimen and increase vegetable and fruit intake.  Navigation:  I will notify patient of lab results.  Patient is aware of 2 more health coaching sessions and a follow up.

## 2017-10-12 MED FILL — ?CETIRIZINE HCL 10 MG TABLE: 10 | 30 days supply | Qty: 30 | Fill #1

## 2017-10-19 MED FILL — SPIRONOLACTONE 50 MG TABLET: 50 | 30 days supply | Qty: 30 | Fill #2

## 2017-10-19 MED FILL — $DEXILANT DR 60 MG CAPSULE: 60 | 30 days supply | Qty: 30 | Fill #3

## 2017-10-20 LAB — HM DIABETES EYE EXAM

## 2017-10-22 ENCOUNTER — Ambulatory Visit: Payer: Self-pay | Attending: Internal Medicine | Admitting: Physician Assistant

## 2017-10-22 VITALS — BP 122/83 | HR 88 | Temp 98.5°F | Resp 18 | Ht 64.0 in | Wt 254.0 lb

## 2017-10-22 DIAGNOSIS — J42 Unspecified chronic bronchitis: Secondary | ICD-10-CM | POA: Insufficient documentation

## 2017-10-22 DIAGNOSIS — Z7982 Long term (current) use of aspirin: Secondary | ICD-10-CM | POA: Insufficient documentation

## 2017-10-22 DIAGNOSIS — M069 Rheumatoid arthritis, unspecified: Secondary | ICD-10-CM | POA: Insufficient documentation

## 2017-10-22 DIAGNOSIS — Z7983 Long term (current) use of bisphosphonates: Secondary | ICD-10-CM | POA: Insufficient documentation

## 2017-10-22 DIAGNOSIS — E669 Obesity, unspecified: Secondary | ICD-10-CM

## 2017-10-22 DIAGNOSIS — Z880 Allergy status to penicillin: Secondary | ICD-10-CM | POA: Insufficient documentation

## 2017-10-22 DIAGNOSIS — F419 Anxiety disorder, unspecified: Secondary | ICD-10-CM

## 2017-10-22 DIAGNOSIS — Z7984 Long term (current) use of oral hypoglycemic drugs: Secondary | ICD-10-CM | POA: Insufficient documentation

## 2017-10-22 DIAGNOSIS — Z888 Allergy status to other drugs, medicaments and biological substances status: Secondary | ICD-10-CM | POA: Insufficient documentation

## 2017-10-22 DIAGNOSIS — Z6841 Body Mass Index (BMI) 40.0 and over, adult: Secondary | ICD-10-CM | POA: Insufficient documentation

## 2017-10-22 DIAGNOSIS — Z791 Long term (current) use of non-steroidal anti-inflammatories (NSAID): Secondary | ICD-10-CM | POA: Insufficient documentation

## 2017-10-22 DIAGNOSIS — Z09 Encounter for follow-up examination after completed treatment for conditions other than malignant neoplasm: Secondary | ICD-10-CM

## 2017-10-22 DIAGNOSIS — E119 Type 2 diabetes mellitus without complications: Secondary | ICD-10-CM | POA: Insufficient documentation

## 2017-10-22 DIAGNOSIS — K219 Gastro-esophageal reflux disease without esophagitis: Secondary | ICD-10-CM | POA: Insufficient documentation

## 2017-10-22 DIAGNOSIS — M79671 Pain in right foot: Secondary | ICD-10-CM

## 2017-10-22 DIAGNOSIS — F319 Bipolar disorder, unspecified: Secondary | ICD-10-CM | POA: Insufficient documentation

## 2017-10-22 DIAGNOSIS — Z91040 Latex allergy status: Secondary | ICD-10-CM | POA: Insufficient documentation

## 2017-10-22 DIAGNOSIS — M545 Low back pain, unspecified: Secondary | ICD-10-CM

## 2017-10-22 DIAGNOSIS — M79672 Pain in left foot: Secondary | ICD-10-CM | POA: Insufficient documentation

## 2017-10-22 DIAGNOSIS — E1169 Type 2 diabetes mellitus with other specified complication: Secondary | ICD-10-CM

## 2017-10-22 DIAGNOSIS — Z79899 Other long term (current) drug therapy: Secondary | ICD-10-CM | POA: Insufficient documentation

## 2017-10-22 DIAGNOSIS — I10 Essential (primary) hypertension: Secondary | ICD-10-CM

## 2017-10-22 DIAGNOSIS — G8929 Other chronic pain: Secondary | ICD-10-CM

## 2017-10-22 LAB — GLUCOSE, POCT (MANUAL RESULT ENTRY): POC Glucose: 109 mg/dl — AB (ref 70–99)

## 2017-10-22 MED ORDER — METHOCARBAMOL 500 MG PO TABS: 500.0000 mg | ORAL_TABLET | Freq: Three times a day (TID) | ORAL | 1 refills | Status: DC | PRN

## 2017-10-22 MED ORDER — MELOXICAM 7.5 MG PO TABS: 7.5000 mg | ORAL_TABLET | Freq: Every day | ORAL | 0 refills | Status: DC

## 2017-10-22 NOTE — Progress Notes (Signed)
Patient ID: Sabrina [name], female   DOB: 1960-08-29, 57 y.o.   MRN: [id number]       Sabrina [name], is a 57 y.o. female  HKV:[id number]  FIE:[id number]  DOB - Jul 07, 1960  Subjective:  Chief Complaint and HPI: Sabrina [name] is a 57 y.o. female here today  for a follow up visit after being seen in the ED 10/04/2017. Today C/o LBP now on and off for over a year.  No radiculopathy.  Anxiety comes and goes.  Lots of financial stressors.  C/o B foot pain after being on her feet.    Seen in ED 10/04/2017.  From ED note: HPI SEMONE [name] is a 56 y.o. female.  57 yo F with a chief complaint of anxiety.  The patient feels that she just cannot lay still.  Feels like she needs to get up and move feels like something is trying to get out of her body.  She recently has had multiple changes to her bipolar medication.  She was most recently on Vraylar, and they decided to change it to propranolol due to her constipation.  Over the past 3 days she feels that she has been more and more agitated.  Having difficulty laying down and resting.  Denies chest pain or shortness of breath.  Denies abdominal pain vomiting or diarrhea.  She has been having some foul-smelling urine.  Denies dysuria frequency or flank pain.  The history is provided by the patient and the spouse.  Anxiety  This is a recurrent problem. The current episode started 2 days ago. The problem occurs constantly. The problem has not changed since onset.Pertinent negatives include no chest pain, no headaches and no shortness of breath. Nothing aggravates the symptoms. Nothing relieves the symptoms. She has tried nothing for the symptoms. The treatment provided no relief.  From  A/P: 57 yo F with what sounds like a dystonic reaction.  Patient is having trouble staying still in the room.  We will give a dose of Benadryl.  UA negative for infection.  Significant improvement post benadryl.  PCP follow up.   4:51 AM:  I have discussed the  diagnosis/risks/treatment options with the patient and family and believe the pt to be eligible for discharge home to follow-up with PCP. We also discussed returning to the ED immediately if new or worsening sx occur. We discussed the sx which are most concerning (e.g., sudden worsening pain, fever, inability to tolerate by mouth) that necessitate immediate return. Medications administered to the patient during their visit and any new prescriptions provided to the patient are listed below.  Medications given during this visit Medications  diphenhydrAMINE (BENADRYL) injection 25 mg (25 mg Intramuscular Given 10/05/17 0022)     The patient appears reasonably screen and/or stabilized for discharge and I doubt any other medical condition or other [facility] requiring further screening, evaluation, or treatment in the ED at this time prior to discharge.   ED/Hospital notes reviewed and summarized above.  Social : works at [employer].  Plays piano  ROS:   Constitutional:  No f/c, No night sweats, No unexplained weight loss. EENT:  No vision changes, No blurry vision, No hearing changes. No mouth, throat, or ear problems.  Respiratory: No cough, No SOB Cardiac: No CP, no palpitations GI:  No abd pain, No N/V/D. GU: No Urinary s/sx Musculoskeletal: +back pain Neuro: No headache, no dizziness, no motor weakness.  Skin: No rash Endocrine:  No polydipsia. No polyuria.  Psych: Denies SI/HI  No problems updated.  ALLERGIES: Allergies  Allergen Reactions  . Benzonatate Other (See Comments)    Made her cough worse  . Latex Other (See Comments)    Hands were scaly  . Penicillins Other (See Comments)    Unknown childhood allergic reaction, Has patient had a PCN reaction causing immediate rash, facial/tongue/throat swelling, SOB or lightheadedness with hypotension: unknown  Has patient had a PCN reaction causing severe rash involving mucus membranes or skin necrosis: No Has patient had a PCN  reaction that required hospitalization No Has patient had a PCN reaction occurring within the last 10 years: No If all of the above answers are "NO", then may proceed with Cephalosporin use.   Marland Kitchen Lisinopril Cough  . Oxycodone-Acetaminophen Nausea And Vomiting    Pt thinks she may be able to take with food    PAST MEDICAL HISTORY: Past Medical History:  Diagnosis Date  . Anxiety   . Arthritis    "legs, knees, hands" (11/16/2014)  . Bipolar disorder ([place])    2 breakdowns - 1998, 2000 had to be hospitalized, followed at [facility]  . Chest pain    a. Myoview 6/16:  anterior and apical ischemia, EF 55-65%;  b. LHC 8/16:  no CAD, Normal EF  . Chest pain 10/2015  . Chronic bronchitis ([place])    "get it q yr"  . Chronic lower back pain   . Depression   . GERD (gastroesophageal reflux disease)   . Gout   . History of echocardiogram    a. Echo 12/15:  Mild LVH, EF 55-60%, mild LAE, PASP 36 mmHg  . Hyperlipidemia LDL goal < 100    "not on RX" (11/15/2014)  . Hypertension   . Migraine    "monthly" (11/16/2014)  . Mixed restrictive and obstructive lung disease ([place])    Health serve chart suggests PFTs done 1/10  . Morbid obesity with BMI of 40.0-44.9, adult ([place])   . Rheumatoid arthritis [facility])    Health serve records indicate Rheumatoid  . Seizures ([place])    "might have had 1; I'm on depakote" (11/16/2014)  . Type II diabetes mellitus ([place])     MEDICATIONS AT HOME: Prior to Admission medications   Medication Sig Start Date End Date Taking? Authorizing Provider  acetaminophen (TYLENOL) 325 MG tablet Take 650 mg by mouth every 6 (six) hours as needed for mild pain or headache.   Yes [provider]  aspirin EC 81 MG tablet Take 1 tablet (81 mg total) by mouth daily. Patient taking differently: Take 81 mg by mouth every morning.  08/06/15  Yes Josue Hector, MD  cetirizine (ALL DAY ALLERGY) 10 MG tablet Take 1 tablet (10 mg total) by mouth daily. 09/02/17  Yes Ladell Pier, MD    dexlansoprazole (DEXILANT) 60 MG capsule Take 1 capsule (60 mg total) by mouth daily. 04/30/17  Yes Mannam, Praveen, MD  hydrALAZINE (APRESOLINE) 25 MG tablet Take 1 tablet (25 mg total) by mouth 3 (three) times daily. 09/28/17  Yes Ladell Pier, MD  losartan (COZAAR) 100 MG tablet Take 1 tablet (100 mg total) by mouth every evening. 09/28/17  Yes Ladell Pier, MD  metFORMIN (GLUCOPHAGE) 850 MG tablet Take 1 tablet (850 mg total) by mouth every morning. 09/28/17  Yes Ladell Pier, MD  oxybutynin (DITROPAN) 5 MG tablet TAKE 1 TABLET BY MOUTH 2 TIMES DAILY. 09/22/17  Yes Ladell Pier, MD  ranitidine (ZANTAC) 300 MG tablet Take 1 tablet (300 mg total)  by mouth at bedtime. 06/25/17  Yes Scot Jun, FNP  spironolactone (ALDACTONE) 50 MG tablet Take 1 tablet (50 mg total) by mouth daily. 06/25/17  Yes Scot Jun, FNP  traZODone (DESYREL) 50 MG tablet Take 25-50 mg by mouth daily as needed for sleep.    Yes [provider]  Vitamin D, Ergocalciferol, (DRISDOL) 50000 units CAPS capsule Take 1 capsule (50,000 Units total) by mouth every 7 (seven) days. Patient taking differently: Take 50,000 Units by mouth every Monday.  09/04/17  Yes Freeman Caldron M, PA-C  azelastine (ASTELIN) 0.1 % nasal spray Place 2 sprays into both nostrils daily. Use in each nostril as directed Patient not taking: Reported on 10/04/2017 06/09/17   Mannam, Hart Robinsons, MD  cyclobenzaprine (FLEXERIL) 10 MG tablet TAKE 1/2-1 TABLET BY MOUTH 3 TIMES DAILY AS NEEDED FOR MUSCLE SPASMS. Patient not taking: Reported on 10/04/2017 09/03/17   Argentina Donovan, PA-C  meloxicam (MOBIC) 7.5 MG tablet Take 1 tablet (7.5 mg total) by mouth daily. Prn pain 10/22/17   Argentina Donovan, PA-C  methocarbamol (ROBAXIN) 500 MG tablet Take 1 tablet (500 mg total) by mouth every 8 (eight) hours as needed for muscle spasms. 10/22/17   Argentina Donovan, PA-C  polyethylene glycol (MIRALAX / GLYCOLAX) packet Take 17 g by mouth daily  as needed. Patient not taking: Reported on 10/22/2017 09/28/17   Ladell Pier, MD     Objective:  EXAM:   Vitals:   10/22/17 1358  BP: 122/83  Pulse: 88  Resp: 18  Temp: 98.5 F (36.9 C)  TempSrc: Oral  SpO2: 100%  Weight: 254 lb (115.2 kg)  Height: 5\' 4"  (1.626 m)    General appearance : A&OX3. NAD. Non-toxic-appearing HEENT: Atraumatic and Normocephalic.  PERRLA. EOM intact. Neck: supple, no JVD. No cervical lymphadenopathy. No thyromegaly Chest/Lungs:  Breathing-non-labored, Good air entry bilaterally, breath sounds normal without rales, rhonchi, or wheezing  CVS: S1 S2 regular, no murmurs, gallops, rubs  Lumbar region with paraspinus spasm.  Neg SLR B.  Reflexes=B.   Extremities: Bilateral Lower Ext shows no edema, both legs are warm to touch with = pulse throughout Neurology:  CN II-XII grossly intact, Non focal.   Psych:  TP linear. J/I WNL. Normal speech. Appropriate eye contact and affect.  Skin:  No Rash  Data Review Lab Results  Component Value Date   HGBA1C 6.2 (H) 10/09/2017   HGBA1C 5.8 (A) 09/28/2017   HGBA1C 5.8 06/03/2017     Assessment & Plan   1. Anxiety Followed by psych.  Continue current regimen.    2. Diabetes mellitus type 2 in obese ([place]) Adequate but could be better control with better diet.   - Glucose (CBG) - Comprehensive metabolic panel  3. Encounter for examination following treatment at hospital Improving from that  4. Essential hypertension Controlled, continue current regimen - Comprehensive metabolic panel  5. Chronic low back pain without sciatica, unspecified back pain laterality - DG Lumbar Spine 2-3 Views; Future - meloxicam (MOBIC) 7.5 MG tablet; Take 1 tablet (7.5 mg total) by mouth daily. Prn pain  Dispense: 30 tablet; Refill: 0 - methocarbamol (ROBAXIN) 500 MG tablet; Take 1 tablet (500 mg total) by mouth every 8 (eight) hours as needed for muscle spasms.  Dispense: 90 tablet; Refill: 1  6. Pain in both  feet Weight loss would help - Ambulatory referral to Podiatry  Patient have been counseled extensively about nutrition and exercise  Return for keep 12/29/2017 appt with Dr  Johnson.  The patient was given clear instructions to go to ER or return to medical center if symptoms don't improve, worsen or new problems develop. The patient verbalized understanding. The patient was told to call to get lab results if they haven't heard anything in the next week.     Freeman Caldron, PA-C [facility] and [place] [place], [place]   10/22/2017, 2:14 PM

## 2017-10-23 ENCOUNTER — Other Ambulatory Visit: Payer: Self-pay

## 2017-10-23 DIAGNOSIS — G8929 Other chronic pain: Secondary | ICD-10-CM

## 2017-10-23 DIAGNOSIS — M545 Low back pain, unspecified: Secondary | ICD-10-CM

## 2017-10-23 LAB — COMPREHENSIVE METABOLIC PANEL
ALBUMIN: 4.4 g/dL (ref 3.5–5.5)
ALK PHOS: 119 IU/L — AB (ref 39–117)
ALT: 69 IU/L — ABNORMAL HIGH (ref 0–32)
AST: 50 IU/L — ABNORMAL HIGH (ref 0–40)
Albumin/Globulin Ratio: 1.2 (ref 1.2–2.2)
BUN / CREAT RATIO: 10 (ref 9–23)
BUN: 9 mg/dL (ref 6–24)
Bilirubin Total: <0.2 mg/dL (ref 0.0–1.2)
CALCIUM: 10.2 mg/dL (ref 8.7–10.2)
CO2: 23 mmol/L (ref 20–29)
CREATININE: 0.92 mg/dL (ref 0.57–1.00)
Chloride: 101 mmol/L (ref 96–106)
GFR calc Af Amer: 81 mL/min/{1.73_m2} (ref >59)
GFR, EST NON AFRICAN AMERICAN: 70 mL/min/{1.73_m2} (ref >59)
GLOBULIN, TOTAL: 3.6 g/dL (ref 1.5–4.5)
GLUCOSE: 89 mg/dL (ref 65–99)
Potassium: 4.7 mmol/L (ref 3.5–5.2)
SODIUM: 139 mmol/L (ref 134–144)
TOTAL PROTEIN: 8 g/dL (ref 6.0–8.5)

## 2017-10-23 MED ORDER — METHOCARBAMOL 500 MG PO TABS: 500.0000 mg | ORAL_TABLET | Freq: Three times a day (TID) | ORAL | 1 refills | Status: DC | PRN

## 2017-10-23 MED ORDER — MELOXICAM 7.5 MG PO TABS: 7.5000 mg | ORAL_TABLET | Freq: Every day | ORAL | 0 refills | Status: DC

## 2017-10-23 MED FILL — METHOCARBAMOL 500 MG TABS: 500 | 30 days supply | Qty: 90 | Fill #0

## 2017-10-23 MED FILL — MELOXICAM 7.5 MG TABLET: 7.5 | 30 days supply | Qty: 30 | Fill #0

## 2017-10-23 NOTE — Progress Notes (Signed)
Patient request her medication change to [facility] pharmacy.

## 2017-10-26 MED FILL — OXYBUTYNIN 5 MG TABLET: 5 | 30 days supply | Qty: 60 | Fill #1

## 2017-10-26 MED FILL — raNITIdine HCL 300 MG TABS: 300 | 30 days supply | Qty: 30 | Fill #2

## 2017-10-27 ENCOUNTER — Telehealth: Payer: Self-pay | Admitting: *Deleted

## 2017-10-27 NOTE — Telephone Encounter (Signed)
Medical Assistant left message on patient's home and cell voicemail. Voicemail states to give a call back to [place] with [facility] at [phone]. !!!Please inform patient of liver enzymes being a little elevated and needing to avoid tylenol and alcohol for now. Patient will have a recheck at her follow up. Kidney and electrolytes were normal!!!

## 2017-10-27 NOTE — Telephone Encounter (Signed)
-----   Message from Argentina Donovan, [place] sent at 10/27/2017  7:56 AM EDT ----- Your liver function is a little elevated.  Avoid products containing tylenol and alcohol for now.  We will recheck this at follow-up.  Kidney function and electrolytes are normal.  Follow-up as planned.  Thanks, Freeman Caldron, PA-C

## 2017-10-28 ENCOUNTER — Encounter: Payer: Self-pay | Admitting: Obstetrics & Gynecology

## 2017-10-28 ENCOUNTER — Ambulatory Visit (INDEPENDENT_AMBULATORY_CARE_PROVIDER_SITE_OTHER): Payer: No Typology Code available for payment source | Admitting: Obstetrics & Gynecology

## 2017-10-28 VITALS — BP 129/81 | HR 83 | Ht 64.0 in | Wt 253.4 lb

## 2017-10-28 DIAGNOSIS — N841 Polyp of cervix uteri: Secondary | ICD-10-CM
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
## 2017-11-02 ENCOUNTER — Telehealth (HOSPITAL_COMMUNITY): Payer: Self-pay | Admitting: *Deleted

## 2017-11-02 ENCOUNTER — Other Ambulatory Visit: Payer: Self-pay | Admitting: Physician Assistant

## 2017-11-02 ENCOUNTER — Ambulatory Visit (HOSPITAL_COMMUNITY)
Admission: RE | Admit: 2017-11-02 | Discharge: 2017-11-02 | Disposition: A | Discharge status: Home / Self Care | Payer: Self-pay | Source: Ambulatory Visit | Attending: Physician Assistant | Admitting: Physician Assistant

## 2017-11-02 DIAGNOSIS — R937 Abnormal findings on diagnostic imaging of other parts of musculoskeletal system: Secondary | ICD-10-CM

## 2017-11-02 DIAGNOSIS — M47814 Spondylosis without myelopathy or radiculopathy, thoracic region: Secondary | ICD-10-CM | POA: Insufficient documentation

## 2017-11-02 DIAGNOSIS — M545 Low back pain, unspecified: Secondary | ICD-10-CM

## 2017-11-02 DIAGNOSIS — M4807 Spinal stenosis, lumbosacral region: Secondary | ICD-10-CM | POA: Insufficient documentation

## 2017-11-02 DIAGNOSIS — M2578 Osteophyte, vertebrae: Secondary | ICD-10-CM | POA: Insufficient documentation

## 2017-11-02 DIAGNOSIS — G8929 Other chronic pain: Secondary | ICD-10-CM | POA: Insufficient documentation

## 2017-11-02 DIAGNOSIS — M4804 Spinal stenosis, thoracic region: Secondary | ICD-10-CM | POA: Insufficient documentation

## 2017-11-02 NOTE — Telephone Encounter (Signed)
Health coaching 2  Labs- cholesterol 194, triglycerides 97, HDL 110, LDL 65, hemoglobin A1C 6.2, mean plasma glucose 131.4   Patient is aware and understands  her lab results.  Goals- Patient states she has been eating better by eating less meat and more fruits and vegetables.  She states she wants to lose weight and is incorporating more exercise by walking. Patient states she is drinking 8 glasses of water daily.  Navigation:  Patient is aware of 1 more health coaching sessions and a follow up. Patient had a [facility] appointment on July 25th.   I tried three times (7/16, 7/22, and 7/24) to reach patient regarding her lab work to no avail, until today .  Time -10 minutes

## 2017-11-03 ENCOUNTER — Telehealth: Payer: Self-pay | Admitting: *Deleted

## 2017-11-03 MED FILL — VIT D2 1.25 MG (50,000 UNIT: 1.25 MG | 28 days supply | Qty: 4 | Fill #2

## 2017-11-03 NOTE — Telephone Encounter (Signed)
Patient verified DOB Patient is aware of arthritis changes being noted and has been referred to the orthopedics. Patient is already scheduled for 11/12/17 with [facility]. No further questions.

## 2017-11-03 NOTE — Telephone Encounter (Signed)
-----   Message from Argentina Donovan, [place] sent at 11/02/2017  4:48 PM EDT ----- Your x-ray shows some arthritis and bony changes as well as some disc narrowing.  I am referring you to orthopedics to see what measures may help with this.  Thanks, Freeman Caldron, PA-C

## 2017-11-06 MED FILL — LOSARTAN POTASSIUM 100 MG T: 100 | 30 days supply | Qty: 30 | Fill #1

## 2017-11-06 MED FILL — ?METFORMIN HCL 850 MG TABLE: 850 | 30 days supply | Qty: 30 | Fill #1

## 2017-11-09 ENCOUNTER — Telehealth: Payer: Self-pay | Admitting: Internal Medicine

## 2017-11-09 ENCOUNTER — Ambulatory Visit: Payer: Self-pay | Attending: Internal Medicine

## 2017-11-09 DIAGNOSIS — E1169 Type 2 diabetes mellitus with other specified complication: Secondary | ICD-10-CM

## 2017-11-09 DIAGNOSIS — E669 Obesity, unspecified: Principal | ICD-10-CM

## 2017-11-09 NOTE — Telephone Encounter (Signed)
Referral submitted to nutritionist per pt's request.

## 2017-11-09 NOTE — Telephone Encounter (Signed)
Will forward to pcp

## 2017-11-09 NOTE — Telephone Encounter (Signed)
Patient came in and would like a referral to see a nutritionists. Patient would like to receive help on what food she should be eating since she on multiple medications and is diabetic.  Please f/up

## 2017-11-11 ENCOUNTER — Ambulatory Visit (INDEPENDENT_AMBULATORY_CARE_PROVIDER_SITE_OTHER): Payer: No Typology Code available for payment source | Admitting: Podiatry

## 2017-11-11 ENCOUNTER — Encounter: Payer: Self-pay | Admitting: Podiatry

## 2017-11-11 DIAGNOSIS — E0843 Diabetes mellitus due to underlying condition with diabetic autonomic (poly)neuropathy: Secondary | ICD-10-CM

## 2017-11-11 DIAGNOSIS — M79676 Pain in unspecified toe(s): Secondary | ICD-10-CM

## 2017-11-11 DIAGNOSIS — B351 Tinea unguium: Secondary | ICD-10-CM

## 2017-11-11 DIAGNOSIS — L989 Disorder of the skin and subcutaneous tissue, unspecified: Secondary | ICD-10-CM

## 2017-11-12 ENCOUNTER — Ambulatory Visit (INDEPENDENT_AMBULATORY_CARE_PROVIDER_SITE_OTHER): Payer: No Typology Code available for payment source | Admitting: Surgery

## 2017-11-12 ENCOUNTER — Encounter (HOSPITAL_COMMUNITY): Payer: Self-pay | Admitting: Emergency Medicine

## 2017-11-12 ENCOUNTER — Emergency Department (HOSPITAL_COMMUNITY)
Admission: EM | Admit: 2017-11-12 | Discharge: 2017-11-12 | Disposition: A | Discharge status: Home / Self Care | Payer: Self-pay | Attending: Emergency Medicine | Admitting: Emergency Medicine

## 2017-11-12 DIAGNOSIS — Z7982 Long term (current) use of aspirin: Secondary | ICD-10-CM | POA: Insufficient documentation

## 2017-11-12 DIAGNOSIS — E119 Type 2 diabetes mellitus without complications: Secondary | ICD-10-CM | POA: Insufficient documentation

## 2017-11-12 DIAGNOSIS — Z7984 Long term (current) use of oral hypoglycemic drugs: Secondary | ICD-10-CM | POA: Insufficient documentation

## 2017-11-12 DIAGNOSIS — R5383 Other fatigue: Secondary | ICD-10-CM | POA: Insufficient documentation

## 2017-11-12 DIAGNOSIS — I10 Essential (primary) hypertension: Secondary | ICD-10-CM | POA: Insufficient documentation

## 2017-11-12 DIAGNOSIS — Z79899 Other long term (current) drug therapy: Secondary | ICD-10-CM | POA: Insufficient documentation

## 2017-11-12 DIAGNOSIS — R251 Tremor, unspecified: Secondary | ICD-10-CM | POA: Insufficient documentation

## 2017-11-12 DIAGNOSIS — Z7722 Contact with and (suspected) exposure to environmental tobacco smoke (acute) (chronic): Secondary | ICD-10-CM | POA: Insufficient documentation

## 2017-11-12 DIAGNOSIS — R531 Weakness: Secondary | ICD-10-CM | POA: Insufficient documentation

## 2017-11-12 DIAGNOSIS — Z91040 Latex allergy status: Secondary | ICD-10-CM | POA: Insufficient documentation

## 2017-11-12 LAB — CBC
HEMATOCRIT: 39.8 % (ref 36.0–46.0)
HEMOGLOBIN: 13 g/dL (ref 12.0–15.0)
MCH: 31.6 pg (ref 26.0–34.0)
MCHC: 32.7 g/dL (ref 30.0–36.0)
MCV: 96.8 fL (ref 78.0–100.0)
PLATELETS: 384 10*3/uL (ref 150–400)
RBC: 4.11 MIL/uL (ref 3.87–5.11)
RDW: 13.7 % (ref 11.5–15.5)
WBC: 13.5 10*3/uL — ABNORMAL HIGH (ref 4.0–10.5)

## 2017-11-12 LAB — URINALYSIS, ROUTINE W REFLEX MICROSCOPIC
Bilirubin Urine: NEGATIVE
GLUCOSE, UA: NEGATIVE mg/dL
Hgb urine dipstick: NEGATIVE
Ketones, ur: 5 mg/dL — AB
LEUKOCYTES UA: NEGATIVE
Nitrite: NEGATIVE
PROTEIN: NEGATIVE mg/dL
SPECIFIC GRAVITY, URINE: 1.006 (ref 1.005–1.030)
pH: 6 (ref 5.0–8.0)

## 2017-11-12 LAB — VALPROIC ACID LEVEL: VALPROIC ACID LVL: 33 ug/mL — AB (ref 50.0–100.0)

## 2017-11-12 LAB — BASIC METABOLIC PANEL
ANION GAP: 11 (ref 5–15)
BUN: 11 mg/dL (ref 6–20)
CHLORIDE: 104 mmol/L (ref 98–111)
CO2: 23 mmol/L (ref 22–32)
Calcium: 9.6 mg/dL (ref 8.9–10.3)
Creatinine, Ser: 0.94 mg/dL (ref 0.44–1.00)
GFR calc non Af Amer: >60 mL/min (ref >60)
Glucose, Bld: 100 mg/dL — ABNORMAL HIGH (ref 70–99)
POTASSIUM: 4.4 mmol/L (ref 3.5–5.1)
Sodium: 138 mmol/L (ref 135–145)

## 2017-11-12 LAB — I-STAT BETA HCG BLOOD, ED (MC, WL, AP ONLY): I-stat hCG, quantitative: <5 m[IU]/mL (ref <5)

## 2017-11-12 LAB — RAPID URINE DRUG SCREEN, HOSP PERFORMED
Amphetamines: NOT DETECTED
BARBITURATES: NOT DETECTED
BENZODIAZEPINES: NOT DETECTED
COCAINE: NOT DETECTED
OPIATES: NOT DETECTED
Tetrahydrocannabinol: NOT DETECTED

## 2017-11-12 LAB — I-STAT TROPONIN, ED: TROPONIN I, POC: 0.01 ng/mL (ref 0.00–0.08)

## 2017-11-12 MED ORDER — SODIUM CHLORIDE 0.9 % IV BOLUS
1000.0000 mL | Freq: Once | INTRAVENOUS | Status: AC
  Administered 2017-11-12: 1000 mL via INTRAVENOUS

## 2017-11-12 MED FILL — ?CETIRIZINE HCL 10 MG TABLE: 10 | 30 days supply | Qty: 30 | Fill #2

## 2017-11-12 NOTE — ED Notes (Signed)
bladder SCAN DONE BY JEASLEY 65ML

## 2017-11-12 NOTE — ED Notes (Signed)
Patient able to ambulate independently  
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
## 2017-11-12 NOTE — ED Notes (Signed)
APP at bedside 

## 2017-11-12 NOTE — ED Notes (Signed)
Patient's family member arrived.  

## 2017-11-12 NOTE — ED Triage Notes (Signed)
Pt reports generalized fatigue and difficulty doing ADL for the past few days, states she has been "sluggish" feeling. Pt a/ox4, speech clear, face symmetrical, no obvious neuro deficits.

## 2017-11-12 NOTE — ED Notes (Signed)
RN assisted patient to restroom in a wheelchair.  Able to transition from wheelchair to toilet and bed to wheelchair without assistance.  Patient unable to provide urine sample, states she has not had any water in a few days.  Will bladder scan

## 2017-11-12 NOTE — ED Notes (Addendum)
Patient experiencing heightened anxiety, tearful with this RN in room.  Spoke to [place], APP who okay'd patient to take 1 pill of home clonazepam.  This RN assisted and witnessed patient taking medication.

## 2017-11-12 NOTE — Discharge Instructions (Addendum)
Please try to get 7-8 hrs of sleep nightly. Eat well balanced meals. Follow up with family doctor closely. Return if worsening.

## 2017-11-12 NOTE — ED Notes (Signed)
Called for patient x 3 with no response in the lobby

## 2017-11-12 NOTE — ED Notes (Signed)
Patient provided with [place] sandwich and water, okay'd by APP

## 2017-11-12 NOTE — ED Notes (Signed)
EDP at bedside updating patient. 

## 2017-11-13 NOTE — Progress Notes (Signed)
Subjective: Patient is a 57 y.o. female presenting to the office today as a new patient with a chief complaint of painful callus lesions to the bilateral feet that have been present for the past 3-5 months. Walking and bearing weight increases the pain. She has not done anything for treatment.  Patient also complains of elongated, thickened nails that cause pain while ambulating in shoes. She is unable to trim her own nails. Patient presents today for further treatment and evaluation.  Past Medical History:  Diagnosis Date  . Anxiety   . Arthritis    "legs, knees, hands" (11/16/2014)  . Bipolar disorder ([place])    2 breakdowns - 1998, 2000 had to be hospitalized, followed at [facility]  . Chest pain    a. Myoview 6/16:  anterior and apical ischemia, EF 55-65%;  b. LHC 8/16:  no CAD, Normal EF  . Chest pain 10/2015  . Chronic bronchitis ([place])    "get it q yr"  . Chronic lower back pain   . Depression   . GERD (gastroesophageal reflux disease)   . Gout   . History of echocardiogram    a. Echo 12/15:  Mild LVH, EF 55-60%, mild LAE, PASP 36 mmHg  . Hyperlipidemia LDL goal < 100    "not on RX" (11/15/2014)  . Hypertension   . Migraine    "monthly" (11/16/2014)  . Mixed restrictive and obstructive lung disease ([place])    Health serve chart suggests PFTs done 1/10  . Morbid obesity with BMI of 40.0-44.9, adult ([place])   . Rheumatoid arthritis [facility])    Health serve records indicate Rheumatoid  . Seizures ([place])    "might have had 1; I'm on depakote" (11/16/2014)  . Type II diabetes mellitus ([facility])     Objective:  Physical Exam General: Alert and oriented x3 in no acute distress  Dermatology: Hyperkeratotic lesions x 4 present on the bilateral feet. Pain on palpation with a central nucleated core noted. Skin is warm, dry and supple bilateral lower extremities. Negative for open lesions or macerations. Nails are tender, long, thickened and dystrophic with subungual debris, consistent with  onychomycosis, 1-5 bilateral. No signs of infection noted.  Vascular: Palpable pedal pulses bilaterally. No edema or erythema noted. Capillary refill within normal limits.  Neurological: Epicritic and protective threshold diminished bilaterally.   Musculoskeletal Exam: Pain on palpation at the keratotic lesion noted. Range of motion within normal limits bilateral. Muscle strength 5/5 in all groups bilateral.  Assessment: 1. Onychodystrophic nails 1-5 bilateral with hyperkeratosis of nails.  2. Onychomycosis of nail due to dermatophyte bilateral 3. Pre-ulcerative callus lesions x 4 to the bilateral feet   Plan of Care:  1. Patient evaluated. 2. Excisional debridement of keratoic lesion using a chisel blade was performed without incident.  3. Dressed with light dressing. 4. Mechanical debridement of nails 1-5 bilaterally performed using a nail nipper. Filed with dremel without incident.  5. Patient is to return to the clinic in 3 months.   Edrick Kins, DPM [facility]  Dr. Edrick Kins, DPM    [address]                                        [place],  [address]                Office [phone]  Fax [phone]  [phone]   

## 2017-11-19 ENCOUNTER — Ambulatory Visit (INDEPENDENT_AMBULATORY_CARE_PROVIDER_SITE_OTHER): Payer: Self-pay

## 2017-11-19 ENCOUNTER — Ambulatory Visit (INDEPENDENT_AMBULATORY_CARE_PROVIDER_SITE_OTHER): Payer: Self-pay | Admitting: Surgery

## 2017-11-19 ENCOUNTER — Encounter (INDEPENDENT_AMBULATORY_CARE_PROVIDER_SITE_OTHER): Payer: Self-pay | Admitting: Surgery

## 2017-11-19 DIAGNOSIS — M5416 Radiculopathy, lumbar region: Secondary | ICD-10-CM

## 2017-11-19 DIAGNOSIS — M545 Low back pain: Secondary | ICD-10-CM

## 2017-11-19 MED FILL — SPIRONOLACTONE 50 MG TABLET: 50 | 30 days supply | Qty: 30 | Fill #3

## 2017-11-19 MED FILL — ?HYDRALAZINE 25MG TAB: 25 | 30 days supply | Qty: 90 | Fill #1
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
## 2017-11-23 MED FILL — $DEXILANT DR 60 MG CAPSULE: 60 | 30 days supply | Qty: 30 | Fill #4

## 2017-11-27 ENCOUNTER — Ambulatory Visit: Payer: Self-pay | Attending: Internal Medicine | Admitting: Pharmacist

## 2017-11-27 DIAGNOSIS — Z5181 Encounter for therapeutic drug level monitoring: Secondary | ICD-10-CM | POA: Diagnosis not present

## 2017-11-27 DIAGNOSIS — Z79899 Other long term (current) drug therapy: Secondary | ICD-10-CM

## 2017-11-27 NOTE — Progress Notes (Signed)
Patient seen for medication reconciliation. Patient taking multiple prn medications. She has multiple bottles of the same medication. Prn medications not currently being taken removed from medication list. All questions and concerns addressed. I have updated her med list in [facility] to reflect what she is taking daily.

## 2017-11-27 NOTE — Patient Instructions (Signed)
Thank you for coming to see [place] today.   Please make an appointment for social work with [facility] when you leave today.   We have updated your medication list.

## 2017-11-28 ENCOUNTER — Emergency Department (HOSPITAL_COMMUNITY)
Admission: EM | Admit: 2017-11-28 | Discharge: 2017-11-28 | Disposition: A | Discharge status: Home / Self Care | Payer: No Typology Code available for payment source | Attending: Emergency Medicine | Admitting: Emergency Medicine

## 2017-11-28 ENCOUNTER — Other Ambulatory Visit: Payer: Self-pay

## 2017-11-28 ENCOUNTER — Emergency Department (HOSPITAL_COMMUNITY): Payer: No Typology Code available for payment source

## 2017-11-28 DIAGNOSIS — R0781 Pleurodynia: Secondary | ICD-10-CM | POA: Diagnosis not present

## 2017-11-28 DIAGNOSIS — Z7984 Long term (current) use of oral hypoglycemic drugs: Secondary | ICD-10-CM | POA: Diagnosis not present

## 2017-11-28 DIAGNOSIS — Z79899 Other long term (current) drug therapy: Secondary | ICD-10-CM | POA: Insufficient documentation

## 2017-11-28 DIAGNOSIS — S99921A Unspecified injury of right foot, initial encounter: Secondary | ICD-10-CM | POA: Diagnosis present

## 2017-11-28 DIAGNOSIS — Z7722 Contact with and (suspected) exposure to environmental tobacco smoke (acute) (chronic): Secondary | ICD-10-CM | POA: Insufficient documentation

## 2017-11-28 DIAGNOSIS — S93104A Unspecified dislocation of right toe(s), initial encounter: Secondary | ICD-10-CM | POA: Diagnosis not present

## 2017-11-28 DIAGNOSIS — Z91040 Latex allergy status: Secondary | ICD-10-CM | POA: Diagnosis not present

## 2017-11-28 DIAGNOSIS — Y92410 Unspecified street and highway as the place of occurrence of the external cause: Secondary | ICD-10-CM | POA: Insufficient documentation

## 2017-11-28 DIAGNOSIS — Y9389 Activity, other specified: Secondary | ICD-10-CM | POA: Diagnosis not present

## 2017-11-28 DIAGNOSIS — I10 Essential (primary) hypertension: Secondary | ICD-10-CM | POA: Insufficient documentation

## 2017-11-28 DIAGNOSIS — M79672 Pain in left foot: Secondary | ICD-10-CM | POA: Insufficient documentation

## 2017-11-28 DIAGNOSIS — M542 Cervicalgia: Secondary | ICD-10-CM | POA: Insufficient documentation

## 2017-11-28 DIAGNOSIS — Y999 Unspecified external cause status: Secondary | ICD-10-CM | POA: Diagnosis not present

## 2017-11-28 DIAGNOSIS — Z7982 Long term (current) use of aspirin: Secondary | ICD-10-CM | POA: Diagnosis not present

## 2017-11-28 DIAGNOSIS — E119 Type 2 diabetes mellitus without complications: Secondary | ICD-10-CM | POA: Insufficient documentation

## 2017-11-28 MED ORDER — LIDOCAINE 5 % EX PTCH
1.0000 | MEDICATED_PATCH | Freq: Once | CUTANEOUS | Status: DC
  Administered 2017-11-28: 1 via TRANSDERMAL
  Filled 2017-11-28: qty 1

## 2017-11-28 MED ORDER — ACETAMINOPHEN 500 MG PO TABS
1000.0000 mg | ORAL_TABLET | Freq: Once | ORAL | Status: AC
Start: 2017-11-28 — End: 2017-11-28
  Administered 2017-11-28: 1000 mg via ORAL
  Filled 2017-11-28: qty 2

## 2017-11-28 MED ORDER — LIDOCAINE HCL (PF) 1 % IJ SOLN
20.0000 mL | Freq: Once | INTRAMUSCULAR | Status: AC
  Administered 2017-11-28: 20 mL
  Filled 2017-11-28: qty 30

## 2017-11-28 MED ORDER — METHOCARBAMOL 500 MG PO TABS
500.0000 mg | ORAL_TABLET | Freq: Once | ORAL | Status: AC
  Administered 2017-11-28: 500 mg via ORAL
  Filled 2017-11-28: qty 1

## 2017-11-28 MED ORDER — OXYCODONE HCL 5 MG PO TABS
5.0000 mg | ORAL_TABLET | Freq: Once | ORAL | Status: AC
  Administered 2017-11-28: 5 mg via ORAL
  Filled 2017-11-28: qty 1

## 2017-11-28 MED ORDER — METHOCARBAMOL 500 MG PO TABS: 500.0000 mg | ORAL_TABLET | Freq: Two times a day (BID) | ORAL | 0 refills | Status: DC

## 2017-11-28 NOTE — ED Notes (Signed)
Bed: WTR5 Expected date:  Expected time:  Means of arrival:  Comments: 
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
## 2017-11-28 NOTE — ED Notes (Signed)
Pt taken to xray via stretcher-chair by rad tech

## 2017-12-01 ENCOUNTER — Encounter: Payer: Self-pay | Attending: Internal Medicine | Admitting: *Deleted

## 2017-12-01 DIAGNOSIS — E1169 Type 2 diabetes mellitus with other specified complication: Secondary | ICD-10-CM | POA: Insufficient documentation

## 2017-12-01 DIAGNOSIS — Z713 Dietary counseling and surveillance: Secondary | ICD-10-CM | POA: Insufficient documentation

## 2017-12-01 DIAGNOSIS — E669 Obesity, unspecified: Secondary | ICD-10-CM | POA: Insufficient documentation

## 2017-12-01 MED FILL — VIT D2 1.25 MG (50,000 UNIT: 1.25 MG | 28 days supply | Qty: 4 | Fill #3

## 2017-12-01 NOTE — Patient Instructions (Signed)
Plan:  Aim for 3 Carb Choices per meal (45 grams)  Aim for 0-2 Carbs per snack if hungry  Include protein in moderation with your meals and snacks Consider reading food labels for Total Carbohydrate of foods Consider checking BG at alternate times per day as directed by MD  Continue taking medication as directed by MD

## 2017-12-02 NOTE — BH Specialist Note (Deleted)
Integrated Behavioral Health Initial Visit  MRN: [id number] Name: Sabrina [name]  Number of [facility] Clinician visits:: {IBH Number of Visits:21014052} Session Start time: ***  Session End time: *** Total time: {IBH Total Time:21014050}  Type of Service: [place] Interpretor:{yes [id number]} Interpretor Name and Language: ***   Warm Hand Off Completed.       SUBJECTIVE: Sabrina [name] is a 57 y.o. female accompanied by {CHL AMB ACCOMPANIED XY:8118867737} Patient was referred by *** for ***. Patient reports the following symptoms/concerns: *** Duration of problem: ***; Severity of problem: {Mild/Moderate/Severe:20260}  OBJECTIVE: Mood: {BHH MOOD:22306} and Affect: {BHH AFFECT:22307} Risk of harm to self or others: {CHL AMB BH Suicide Current Mental Status:21022748}  LIFE CONTEXT: Family and Social: *** School/Work: *** Self-Care: *** Life Changes: ***  GOALS ADDRESSED: Patient will: 1. Reduce symptoms of: {IBH Symptoms:21014056} 2. Increase knowledge and/or ability of: {IBH Patient Tools:21014057}  3. Demonstrate ability to: {IBH Goals:21014053}  INTERVENTIONS: Interventions utilized: {IBH Interventions:21014054}  Standardized Assessments completed: {IBH Screening Tools:21014051}  ASSESSMENT: Patient currently experiencing ***.   Patient may benefit from ***.  PLAN: 1. Follow up with behavioral health clinician on : *** 2. Behavioral recommendations: *** 3. Referral(s): {IBH Referrals:21014055} 4. "From scale of 1-10, how likely are you to follow plan?": ***  Asante C McCoy

## 2017-12-03 ENCOUNTER — Ambulatory Visit: Payer: Self-pay | Admitting: Skilled Nursing Facility1

## 2017-12-03 ENCOUNTER — Ambulatory Visit: Payer: Self-pay | Attending: Family Medicine | Admitting: Licensed Clinical Social Worker

## 2017-12-03 DIAGNOSIS — F419 Anxiety disorder, unspecified: Secondary | ICD-10-CM

## 2017-12-03 DIAGNOSIS — F331 Major depressive disorder, recurrent, moderate: Secondary | ICD-10-CM

## 2017-12-03 MED FILL — OXYBUTYNIN 5 MG TABLET: 5 | 30 days supply | Qty: 60 | Fill #2
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
## 2017-12-04 ENCOUNTER — Emergency Department (HOSPITAL_COMMUNITY)
Admission: EM | Admit: 2017-12-04 | Discharge: 2017-12-05 | Disposition: A | Discharge status: Other Acute Inpatient Hospital | Payer: No Typology Code available for payment source | Attending: Emergency Medicine | Admitting: Emergency Medicine

## 2017-12-04 ENCOUNTER — Encounter (HOSPITAL_COMMUNITY): Payer: Self-pay

## 2017-12-04 ENCOUNTER — Ambulatory Visit (HOSPITAL_COMMUNITY)
Admission: RE | Admit: 2017-12-04 | Discharge: 2017-12-04 | Disposition: A | Discharge status: Home / Self Care | Payer: Self-pay | Source: Ambulatory Visit | Attending: Surgery | Admitting: Surgery

## 2017-12-04 ENCOUNTER — Other Ambulatory Visit: Payer: Self-pay

## 2017-12-04 DIAGNOSIS — Z79899 Other long term (current) drug therapy: Secondary | ICD-10-CM | POA: Diagnosis not present

## 2017-12-04 DIAGNOSIS — I10 Essential (primary) hypertension: Secondary | ICD-10-CM | POA: Diagnosis not present

## 2017-12-04 DIAGNOSIS — M545 Low back pain: Secondary | ICD-10-CM

## 2017-12-04 DIAGNOSIS — Z7722 Contact with and (suspected) exposure to environmental tobacco smoke (acute) (chronic): Secondary | ICD-10-CM | POA: Insufficient documentation

## 2017-12-04 DIAGNOSIS — E119 Type 2 diabetes mellitus without complications: Secondary | ICD-10-CM | POA: Insufficient documentation

## 2017-12-04 DIAGNOSIS — F319 Bipolar disorder, unspecified: Secondary | ICD-10-CM

## 2017-12-04 DIAGNOSIS — F315 Bipolar disorder, current episode depressed, severe, with psychotic features: Secondary | ICD-10-CM | POA: Diagnosis not present

## 2017-12-04 DIAGNOSIS — Z91040 Latex allergy status: Secondary | ICD-10-CM | POA: Diagnosis not present

## 2017-12-04 DIAGNOSIS — F4311 Post-traumatic stress disorder, acute: Secondary | ICD-10-CM | POA: Diagnosis not present

## 2017-12-04 DIAGNOSIS — Z7984 Long term (current) use of oral hypoglycemic drugs: Secondary | ICD-10-CM | POA: Insufficient documentation

## 2017-12-04 DIAGNOSIS — F419 Anxiety disorder, unspecified: Secondary | ICD-10-CM | POA: Diagnosis not present

## 2017-12-04 DIAGNOSIS — Z590 Homelessness: Secondary | ICD-10-CM | POA: Diagnosis not present

## 2017-12-04 DIAGNOSIS — R410 Disorientation, unspecified: Secondary | ICD-10-CM | POA: Diagnosis present

## 2017-12-04 DIAGNOSIS — Z7982 Long term (current) use of aspirin: Secondary | ICD-10-CM | POA: Insufficient documentation

## 2017-12-04 LAB — CBC
HCT: 38 % (ref 36.0–46.0)
Hemoglobin: 12 g/dL (ref 12.0–15.0)
MCH: 31.7 pg (ref 26.0–34.0)
MCHC: 31.6 g/dL (ref 30.0–36.0)
MCV: 100.5 fL — ABNORMAL HIGH (ref 78.0–100.0)
PLATELETS: 304 10*3/uL (ref 150–400)
RBC: 3.78 MIL/uL — AB (ref 3.87–5.11)
RDW: 14.2 % (ref 11.5–15.5)
WBC: 13.9 10*3/uL — AB (ref 4.0–10.5)

## 2017-12-04 LAB — COMPREHENSIVE METABOLIC PANEL
ALK PHOS: 93 U/L (ref 38–126)
ALT: 46 U/L — AB (ref 0–44)
AST: 35 U/L (ref 15–41)
Albumin: 3.3 g/dL — ABNORMAL LOW (ref 3.5–5.0)
Anion gap: 12 (ref 5–15)
BUN: 7 mg/dL (ref 6–20)
CALCIUM: 9.8 mg/dL (ref 8.9–10.3)
CHLORIDE: 105 mmol/L (ref 98–111)
CO2: 22 mmol/L (ref 22–32)
CREATININE: 0.87 mg/dL (ref 0.44–1.00)
Glucose, Bld: 107 mg/dL — ABNORMAL HIGH (ref 70–99)
Potassium: 3.9 mmol/L (ref 3.5–5.1)
Sodium: 139 mmol/L (ref 135–145)
Total Bilirubin: 0.9 mg/dL (ref 0.3–1.2)
Total Protein: 7.1 g/dL (ref 6.5–8.1)

## 2017-12-04 LAB — CBG MONITORING, ED: GLUCOSE-CAPILLARY: 73 mg/dL (ref 70–99)

## 2017-12-04 MED ORDER — ACETAMINOPHEN 325 MG PO TABS: 650.0000 mg | ORAL_TABLET | ORAL | Status: DC | PRN

## 2017-12-04 MED ORDER — ZOLPIDEM TARTRATE 5 MG PO TABS: 5.0000 mg | ORAL_TABLET | Freq: Every evening | ORAL | Status: DC | PRN

## 2017-12-04 MED ORDER — ONDANSETRON HCL 4 MG PO TABS: 4.0000 mg | ORAL_TABLET | Freq: Three times a day (TID) | ORAL | Status: DC | PRN
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
## 2017-12-04 NOTE — ED Notes (Addendum)
Called pt to recheck vitals. No response.  
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
## 2017-12-04 NOTE — ED Notes (Signed)
Security paged to wand patient
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
## 2017-12-04 NOTE — ED Notes (Signed)
Per RN Maudie Mercury, don't worry about collecting etoh .  RN will notify me if need to collect.

## 2017-12-04 NOTE — ED Notes (Addendum)
Patient has been placed in purple scrubs and wanded by security.  Belongings were removed, placed in bags, and inventoried.

## 2017-12-05 ENCOUNTER — Inpatient Hospital Stay (HOSPITAL_COMMUNITY)
Admission: AD | Admit: 2017-12-05 | Discharge: 2017-12-08 | DRG: 885 | Disposition: A | Discharge status: Home / Self Care | Payer: Federal, State, Local not specified - Other | Source: Intra-hospital | Attending: Psychiatry | Admitting: Psychiatry

## 2017-12-05 ENCOUNTER — Encounter (HOSPITAL_COMMUNITY): Payer: Self-pay

## 2017-12-05 ENCOUNTER — Other Ambulatory Visit: Payer: Self-pay

## 2017-12-05 DIAGNOSIS — M069 Rheumatoid arthritis, unspecified: Secondary | ICD-10-CM | POA: Diagnosis present

## 2017-12-05 DIAGNOSIS — R9439 Abnormal result of other cardiovascular function study: Secondary | ICD-10-CM

## 2017-12-05 DIAGNOSIS — K219 Gastro-esophageal reflux disease without esophagitis: Secondary | ICD-10-CM | POA: Diagnosis present

## 2017-12-05 DIAGNOSIS — F419 Anxiety disorder, unspecified: Secondary | ICD-10-CM | POA: Diagnosis present

## 2017-12-05 DIAGNOSIS — Z79899 Other long term (current) drug therapy: Secondary | ICD-10-CM | POA: Diagnosis not present

## 2017-12-05 DIAGNOSIS — E785 Hyperlipidemia, unspecified: Secondary | ICD-10-CM | POA: Diagnosis present

## 2017-12-05 DIAGNOSIS — G4700 Insomnia, unspecified: Secondary | ICD-10-CM | POA: Diagnosis present

## 2017-12-05 DIAGNOSIS — Z7984 Long term (current) use of oral hypoglycemic drugs: Secondary | ICD-10-CM

## 2017-12-05 DIAGNOSIS — Z590 Homelessness: Secondary | ICD-10-CM | POA: Diagnosis not present

## 2017-12-05 DIAGNOSIS — G4733 Obstructive sleep apnea (adult) (pediatric): Secondary | ICD-10-CM | POA: Diagnosis present

## 2017-12-05 DIAGNOSIS — N393 Stress incontinence (female) (male): Secondary | ICD-10-CM

## 2017-12-05 DIAGNOSIS — Z6841 Body Mass Index (BMI) 40.0 and over, adult: Secondary | ICD-10-CM

## 2017-12-05 DIAGNOSIS — E119 Type 2 diabetes mellitus without complications: Secondary | ICD-10-CM | POA: Diagnosis present

## 2017-12-05 DIAGNOSIS — F430 Acute stress reaction: Secondary | ICD-10-CM | POA: Diagnosis present

## 2017-12-05 DIAGNOSIS — I10 Essential (primary) hypertension: Secondary | ICD-10-CM | POA: Diagnosis present

## 2017-12-05 DIAGNOSIS — F3163 Bipolar disorder, current episode mixed, severe, without psychotic features: Secondary | ICD-10-CM | POA: Diagnosis present

## 2017-12-05 DIAGNOSIS — R0789 Other chest pain: Secondary | ICD-10-CM

## 2017-12-05 LAB — GLUCOSE, CAPILLARY: GLUCOSE-CAPILLARY: 142 mg/dL — AB (ref 70–99)

## 2017-12-05 MED ORDER — LOSARTAN POTASSIUM 50 MG PO TABS
100.0000 mg | ORAL_TABLET | Freq: Every evening | ORAL | Status: DC
  Administered 2017-12-05 – 2017-12-07 (×3): 100 mg via ORAL
  Filled 2017-12-05 (×5): qty 2

## 2017-12-05 MED ORDER — MAGNESIUM HYDROXIDE 400 MG/5ML PO SUSP: 30.0000 mL | Freq: Every day | ORAL | Status: DC | PRN

## 2017-12-05 MED ORDER — LURASIDONE HCL 40 MG PO TABS
40.0000 mg | ORAL_TABLET | Freq: Every day | ORAL | Status: DC
  Filled 2017-12-05: qty 1

## 2017-12-05 MED ORDER — MELOXICAM 7.5 MG PO TABS
7.5000 mg | ORAL_TABLET | Freq: Every day | ORAL | Status: DC | PRN
  Administered 2017-12-05: 7.5 mg via ORAL
  Filled 2017-12-05: qty 1

## 2017-12-05 MED ORDER — METFORMIN HCL 850 MG PO TABS
850.0000 mg | ORAL_TABLET | Freq: Every day | ORAL | Status: DC
  Administered 2017-12-05 – 2017-12-08 (×4): 850 mg via ORAL
  Filled 2017-12-05 (×7): qty 1

## 2017-12-05 MED ORDER — HYDROXYZINE HCL 25 MG PO TABS
25.0000 mg | ORAL_TABLET | Freq: Four times a day (QID) | ORAL | Status: DC | PRN
  Administered 2017-12-05 – 2017-12-06 (×2): 25 mg via ORAL
  Filled 2017-12-05 (×2): qty 1

## 2017-12-05 MED ORDER — CLONAZEPAM 0.5 MG PO TABS: 0.5000 mg | ORAL_TABLET | Freq: Two times a day (BID) | ORAL | Status: DC | PRN

## 2017-12-05 MED ORDER — INFLUENZA VAC SPLIT QUAD 0.5 ML IM SUSY
0.5000 mL | PREFILLED_SYRINGE | INTRAMUSCULAR | Status: DC
  Filled 2017-12-05: qty 0.5

## 2017-12-05 MED ORDER — OXYBUTYNIN CHLORIDE 5 MG PO TABS
5.0000 mg | ORAL_TABLET | Freq: Two times a day (BID) | ORAL | Status: DC
  Administered 2017-12-05 – 2017-12-08 (×7): 5 mg via ORAL
  Filled 2017-12-05 (×12): qty 1

## 2017-12-05 MED ORDER — SPIRONOLACTONE 25 MG PO TABS
50.0000 mg | ORAL_TABLET | Freq: Every day | ORAL | Status: DC
  Administered 2017-12-05 – 2017-12-08 (×4): 50 mg via ORAL
  Filled 2017-12-05: qty 2
  Filled 2017-12-05: qty 1
  Filled 2017-12-05 (×2): qty 2
  Filled 2017-12-05: qty 1
  Filled 2017-12-05: qty 2
  Filled 2017-12-05: qty 1
  Filled 2017-12-05: qty 2

## 2017-12-05 MED ORDER — TRAZODONE HCL 50 MG PO TABS
50.0000 mg | ORAL_TABLET | Freq: Every evening | ORAL | Status: DC | PRN
  Administered 2017-12-07 (×3): 50 mg via ORAL
  Filled 2017-12-05 (×3): qty 1

## 2017-12-05 MED ORDER — HYDRALAZINE HCL 25 MG PO TABS
25.0000 mg | ORAL_TABLET | Freq: Three times a day (TID) | ORAL | Status: DC
  Administered 2017-12-05 – 2017-12-08 (×11): 25 mg via ORAL
  Filled 2017-12-05 (×17): qty 1

## 2017-12-05 MED ORDER — DIVALPROEX SODIUM ER 500 MG PO TB24
500.0000 mg | ORAL_TABLET | Freq: Every day | ORAL | Status: DC
  Administered 2017-12-05: 500 mg via ORAL
  Filled 2017-12-05 (×3): qty 1

## 2017-12-05 MED ORDER — ARIPIPRAZOLE 5 MG PO TABS
5.0000 mg | ORAL_TABLET | Freq: Every day | ORAL | Status: DC
  Filled 2017-12-05 (×4): qty 1

## 2017-12-05 MED ORDER — QUETIAPINE FUMARATE 100 MG PO TABS: 100.0000 mg | ORAL_TABLET | Freq: Every day | ORAL | Status: DC

## 2017-12-05 MED ORDER — QUETIAPINE FUMARATE 100 MG PO TABS
100.0000 mg | ORAL_TABLET | Freq: Every day | ORAL | Status: DC
  Administered 2017-12-05: 100 mg via ORAL
  Filled 2017-12-05 (×4): qty 1

## 2017-12-05 MED ORDER — ASPIRIN EC 81 MG PO TBEC
81.0000 mg | DELAYED_RELEASE_TABLET | Freq: Every day | ORAL | Status: DC
  Administered 2017-12-05 – 2017-12-08 (×4): 81 mg via ORAL
  Filled 2017-12-05 (×7): qty 1

## 2017-12-05 MED ORDER — LURASIDONE HCL 20 MG PO TABS
20.0000 mg | ORAL_TABLET | Freq: Every day | ORAL | Status: DC
Start: 2017-12-05 — End: 2017-12-07
  Administered 2017-12-05 – 2017-12-06 (×2): 20 mg via ORAL
  Filled 2017-12-05 (×3): qty 1
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
## 2017-12-05 NOTE — [facility] Group Notes (Signed)
[facility] Notes:  (Nursing/MHT/Case Management/Adjunct)  Date:  12/05/2017  Time:  3:04 PM  Type of Therapy:  Nurse Education  Participation Level:  Active  Participation Quality:  Appropriate and Attentive  Affect:  Appropriate  Cognitive:  Appropriate  Insight:  Improving  Engagement in Group:  Engaged  Modes of Intervention:  Discussion, Education and Support  Summary of Progress/Problems: Pt was attentive during discussion of sleep hygiene.   Marya Landry 12/05/2017, 3:04 PM
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
## 2017-12-05 NOTE — Tx Team (Signed)
Initial Treatment Plan 12/05/2017 3:57 AM Sabrina [name] YWV:[id number]    PATIENT STRESSORS: Educational concerns Health problems Marital or family conflict   PATIENT STRENGTHS: Average or above average intelligence Communication skills Religious Affiliation   PATIENT IDENTIFIED PROBLEMS: "I am strong, I think I should be out of this hospital by tomorrow"  "I don't need to be here"  Depression  Anxiety  Psychosis             DISCHARGE CRITERIA:  Ability to meet basic life and health needs Reduction of life-threatening or endangering symptoms to within safe limits Verbal commitment to aftercare and medication compliance  PRELIMINARY DISCHARGE PLAN: Participate in family therapy Return to previous living arrangement Return to previous work or school arrangements  PATIENT/FAMILY INVOLVEMENT: This treatment plan has been presented to and reviewed with the patient, Sabrina [name], and/or family member.  The patient and family have been given the opportunity to ask questions and make suggestions.  Leia Alf, RN 12/05/2017, 3:57 AM
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
## 2017-12-05 NOTE — Progress Notes (Signed)
Pt refused Abilify. Says she takes Depakote.

## 2017-12-05 NOTE — Progress Notes (Signed)
Patient states that she had a "pleasant" day overall. She was pleased with the fact that she ate a hot meal today and had multiple conversations with the staff regarding her medication. Her goal for tomorrow is to work on her coping skills so that she is ready to be discharged.

## 2017-12-05 NOTE — ED Notes (Signed)
Consent signed for transfer to [facility].  [place] notified.  Family member called by patient and updated.   Called into room by patient stating I just want to go home this is taking too long.  Explained the process and that transportation is on the way.  Encouraged to stay.  Agreed at this time.
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
## 2017-12-06 LAB — HEMOGLOBIN A1C
Hgb A1c MFr Bld: 6 % — ABNORMAL HIGH (ref 4.8–5.6)
MEAN PLASMA GLUCOSE: 125.5 mg/dL

## 2017-12-06 LAB — LIPID PANEL
CHOLESTEROL: 169 mg/dL (ref 0–200)
HDL: 81 mg/dL (ref >40)
LDL Cholesterol: 70 mg/dL (ref 0–99)
TRIGLYCERIDES: 88 mg/dL (ref <150)
Total CHOL/HDL Ratio: 2.1 RATIO
VLDL: 18 mg/dL (ref 0–40)

## 2017-12-06 LAB — VALPROIC ACID LEVEL: Valproic Acid Lvl: 20 ug/mL — ABNORMAL LOW (ref 50.0–100.0)

## 2017-12-06 LAB — TSH: TSH: 2.801 u[IU]/mL (ref 0.350–4.500)

## 2017-12-06 MED ORDER — DIVALPROEX SODIUM ER 250 MG PO TB24
750.0000 mg | ORAL_TABLET | Freq: Every day | ORAL | Status: DC
  Administered 2017-12-06 – 2017-12-07 (×2): 750 mg via ORAL
  Filled 2017-12-06 (×5): qty 3
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
## 2017-12-06 NOTE — BHH Group Notes (Signed)
[facility] LCSW Group Therapy Note  Date/Time:  12/06/2017  11:00AM-12:00PM  Type of Therapy and Topic:  Group Therapy:  Music and Mood  Participation Level:  Active   Description of Group: In this process group, members listened to a variety of genres of music and identified that different types of music evoke different responses.  Patients were encouraged to identify music that was soothing for them and music that was energizing for them.  Patients discussed how this knowledge can help with wellness and recovery in various ways including managing depression and anxiety as well as encouraging healthy sleep habits.    Therapeutic Goals: 1. Patients will explore the impact of different varieties of music on mood 2. Patients will verbalize the thoughts they have when listening to different types of music 3. Patients will identify music that is soothing to them as well as music that is energizing to them 4. Patients will discuss how to use this knowledge to assist in maintaining wellness and recovery 5. Patients will explore the use of music as a coping skill  Summary of Patient Progress:  At the beginning of group, patient expressed that she felt anxiety with "shaking in my chest."   At one point during group she said a song had made the shaking in her chest go away, but at the end of group she said it was back.  She stated that she used to listen to a song as a child with a woman singing 'you can't always feel good or you won't know God is there," and that while she was listening to different songs, this woman's voice kept singing that song in her head.  Therefore, she was unable to enjoy the music.  Therapeutic Modalities: Solution Focused Brief Therapy Activity   Selmer Dominion, LCSW

## 2017-12-06 NOTE — Progress Notes (Signed)
D    Pt is pleasant on approach and cooperative   She has a flat affect but her behavior is appropriate   She reports she will be discharged in the morning A   Verbal support given   Medications administered and effectiveness monitored  Q 15 min checks R   Pt is safe at this time

## 2017-12-06 NOTE — Progress Notes (Signed)
Nutrition Brief Note  Patient identified on the Malnutrition Screening Tool (MST) Report  Patient with weight loss since March 2019. Pt visited a outpatient RD on 9/3 for diabetes self management.   Wt Readings from Last 15 Encounters:  12/05/17 112.5 kg  11/28/17 112.5 kg  10/28/17 114.9 kg  10/22/17 115.2 kg  10/09/17 115.4 kg  10/04/17 117 kg  09/28/17 119.7 kg  09/03/17 120.2 kg  07/28/17 124.3 kg  06/25/17 124.3 kg  06/09/17 124.3 kg  06/09/17 125.4 kg  06/03/17 125.6 kg  04/30/17 122.3 kg  01/09/17 122.8 kg    Body mass index is 42.57 kg/m. Patient meets criteria for morbid obesity based on current BMI.    Labs and medications reviewed.   No nutrition interventions warranted at this time. If nutrition issues arise, please consult RD.   Clayton Bibles, MS, RD, [place] Dietitian Pager: [phone] After Hours Pager: [phone]
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
## 2017-12-06 NOTE — BHH Group Notes (Signed)
Adult Psychoeducational Group Note  Date:  12/06/2017 Time:  9:52 PM  Group Topic/Focus:  Wrap-Up Group:   The focus of this group is to help patients review their daily goal of treatment and discuss progress on daily workbooks.  Participation Level:  Active  Participation Quality:  Appropriate and Attentive  Affect:  Appropriate  Cognitive:  Alert and Appropriate  Insight: Appropriate and Good  Engagement in Group:  Engaged  Modes of Intervention:  Discussion and Education  Additional Comments:  Pt attended and participated in wrap up group this evening. Pt rated their day an 8/10, due to them participating in activities, feeling better and watching football. Pt goal was to be discharged, but they have not yet received a discharge date.   Cristi Loron 12/06/2017, 9:52 PM

## 2017-12-06 NOTE — Progress Notes (Signed)
Patient ID: Sabrina [name], female   DOB: December 18, 1960, 58 y.o.   MRN: [id number] DAR Note: Pt continue to be manic and delusional; "My mind can't stop thinking about what I need to be doing at home." Pt also endorsed severe anxiety, depression and VH with moderate generalized body pain; "My chest always beat very fast when I remember all the things I should be doing now." Pt at this time denied SI/HI. Pt was med compliant. All patient's questions and concerns addressed. Support, encouragement, and safe environment provided. 15-minute safety checks continue. Pt did attend wrap-up group.

## 2017-12-06 NOTE — Progress Notes (Addendum)
Patient ID: Sabrina [name], female   DOB: Jan 31, 1961, 57 y.o.   MRN: [id number]    D: Pt started off the morning very irritable and agitated, she reported that she was in a lot of pain and that her anxiety was high. This Probation officer offered medication, pt refused and reported that she was not going to take all that medication. Pt went to group and engaged in treatment, she brightened as the day went on. Pt reported that she was feeling much better and that she was ready for discharge. Pt reported that her depression was a 4, her anxiety was a 2, and her hopelessness was a 0. Pt reported that her goal was to get released. Pt reported being negative SI/HI, no AH/VH noted. A: 15 min checks continued for patient safety. R: Pt safety maintained.

## 2017-12-06 NOTE — BHH Suicide Risk Assessment (Signed)
[place] INPATIENT:  Family/Significant Other Suicide Prevention Education  Suicide Prevention Education:  Contact Attempts: sister in [place] [phone] has been identified by the patient as the family member/significant other with whom the patient will be residing, and identified as the person(s) who will aid the patient in the event of a mental health crisis.  With written consent from the patient, two attempts were made to provide suicide prevention education, prior to and/or following the patient's discharge.  We were unsuccessful in providing suicide prevention education.  A suicide education pamphlet was given to the patient to share with family/significant other.  Date and time of first attempt:  12/06/2017  /  1:55 PM   - contact was made but sister was unable to talk about SPE at this time.  Date and time of second attempt:   To be done  [name] 12/06/2017, 1:54 PM

## 2017-12-07 DIAGNOSIS — F430 Acute stress reaction: Secondary | ICD-10-CM

## 2017-12-07 MED ORDER — LURASIDONE HCL 40 MG PO TABS
40.0000 mg | ORAL_TABLET | Freq: Every day | ORAL | Status: DC
  Administered 2017-12-07: 40 mg via ORAL
  Filled 2017-12-07 (×3): qty 1

## 2017-12-07 NOTE — Progress Notes (Signed)
Recreation Therapy Notes   Date: 9.9.19 Time: 1000 Location: 500 Hall Dayroom  Group Topic: Goal Setting  Goal Area(s) Addresses:  Patient will successfully set at least 3 goals for their future during group.  Patient will successfully identify benefit of setting goals.    Behavioral Response: Engaged  Intervention: Worksheet, pencils  Activity: Goal Setting.  Patients were given worksheets that were broken into 6 categories (family, friends, work/school, body, mental health and spirituality).  Patients were to identify what they are doing well, what they want to improve and set a goal of how to make that improvement.  Patients will then share their top 3 goals with the group.    Education Outcome:  Acknowledges education In group clarification offered Needs additional education  Clinical Observations/Feedback: Pt stated with her family, she communicates with her sister and cousin and set a goal not agonizing over not having children; for spirituality- pt stated she reads the Bible everyday, needs to "improve letting go and letting God" and set a goal to be at peace; and for mental health- pt expressed she goes to treatment, needs to improve sleeping the whole night and set a goal of "handle anxiety, stress and take meds".    Victorino Sparrow, LRT/CTRS         Victorino Sparrow A 12/07/2017 12:51 PM

## 2017-12-07 NOTE — Plan of Care (Signed)
  Problem: Self-Concept: Goal: Level of anxiety will decrease Outcome: Progressing   Problem: Health Behavior/Discharge Planning: Goal: Compliance with therapeutic regimen will improve Outcome: Progressing   Problem: Safety: Goal: Periods of time without injury will increase Outcome: Progressing  DAR NOTE: Patient presents with flat affect and depressed mood.  Denies suicidal thought, pain, auditory and visual hallucinations.  Described energy level as low and concentration as good.  Rates depression at 2, hopelessness at 0, and anxiety at 0.  Maintained on routine safety checks.  Medications given as prescribed.  Support and encouragement offered as needed.  Attended group and participated.  States goal for today is "to be discharged."  Patient observed socializing with peers in the dayroom.  Offered no complaint.

## 2017-12-07 NOTE — Tx Team (Signed)
Interdisciplinary Treatment and Diagnostic Plan Update  12/07/2017 Time of Session: 10:55am Sabrina [name] MRN: [id number]  Principal Diagnosis: Acute stress reaction  Secondary Diagnoses: Principal Problem:   Acute stress reaction Active Problems:   Bipolar 1 disorder, mixed, severe (HCC)   Current Medications:  Current Facility-Administered Medications  Medication Dose Route Frequency Provider Last Rate Last Dose  . aspirin EC tablet 81 mg  81 mg Oral Daily Laverle Hobby, PA-C   81 mg at 12/07/17 0804  . clonazePAM (KLONOPIN) tablet 0.5 mg  0.5 mg Oral BID PRN Laverle Hobby, PA-C      . divalproex (DEPAKOTE ER) 24 hr tablet 750 mg  750 mg Oral QHS Derrill Center, NP   750 mg at 12/06/17 2134  . hydrALAZINE (APRESOLINE) tablet 25 mg  25 mg Oral TID Patriciaann Clan E, PA-C   25 mg at 12/07/17 1202  . hydrOXYzine (ATARAX/VISTARIL) tablet 25 mg  25 mg Oral Q6H PRN Laverle Hobby, PA-C   25 mg at 12/06/17 0410  . Influenza vac split quadrivalent PF (FLUARIX) injection 0.5 mL  0.5 mL Intramuscular Tomorrow-1000 Maris Berger T, MD      . losartan (COZAAR) tablet 100 mg  100 mg Oral QPM Patriciaann Clan E, PA-C   100 mg at 12/06/17 1729  . lurasidone (LATUDA) tablet 40 mg  40 mg Oral Q supper Maris Berger T, MD      . magnesium hydroxide (MILK OF MAGNESIA) suspension 30 mL  30 mL Oral Daily PRN Laverle Hobby, PA-C      . meloxicam (MOBIC) tablet 7.5 mg  7.5 mg Oral Daily PRN Patriciaann Clan E, PA-C   7.5 mg at 12/05/17 1300  . metFORMIN (GLUCOPHAGE) tablet 850 mg  850 mg Oral Q breakfast Laverle Hobby, PA-C   850 mg at 12/07/17 0804  . oxybutynin (DITROPAN) tablet 5 mg  5 mg Oral BID Laverle Hobby, PA-C   5 mg at 12/07/17 0804  . spironolactone (ALDACTONE) tablet 50 mg  50 mg Oral Daily Patriciaann Clan E, PA-C   50 mg at 12/07/17 0805  . traZODone (DESYREL) tablet 50 mg  50 mg Oral QHS PRN,MR X 1 Dara Hoyer, PA-C   50 mg at 12/07/17 0116   PTA  Medications: Medications Prior to Admission  Medication Sig Dispense Refill Last Dose  . acetaminophen (TYLENOL) 325 MG tablet Take 650 mg by mouth every 6 (six) hours as needed for moderate pain.   Past Month at Unknown time  . aspirin EC 81 MG tablet Take 1 tablet (81 mg total) by mouth daily.   12/04/2017 at Unknown time  . cetirizine (ALL DAY ALLERGY) 10 MG tablet Take 1 tablet (10 mg total) by mouth daily. 30 tablet 3 12/04/2017 at Unknown time  . clonazePAM (KLONOPIN) 0.5 MG tablet Take 0.5 mg by mouth 2 (two) times daily as needed for anxiety.   Past Month at Unknown time  . divalproex (DEPAKOTE ER) 500 MG 24 hr tablet Take 500 mg by mouth at bedtime.   12/03/2017 at Unknown time  . hydrALAZINE (APRESOLINE) 25 MG tablet Take 1 tablet (25 mg total) by mouth 3 (three) times daily. 90 tablet 5 12/04/2017 at Unknown time  . hydroxypropyl methylcellulose / hypromellose (ISOPTO TEARS / GONIOVISC) 2.5 % ophthalmic solution Place 1 drop into both eyes 3 (three) times daily as needed for dry eyes.   Past Week at Unknown time  . losartan (COZAAR) 100 MG tablet  Take 1 tablet (100 mg total) by mouth every evening. 30 tablet 5 12/03/2017 at Unknown time  . meloxicam (MOBIC) 7.5 MG tablet Take 1 tablet (7.5 mg total) by mouth daily. Prn pain (Patient taking differently: Take 7.5 mg by mouth daily as needed for pain. ) 30 tablet 0 Past Month at Unknown time  . metFORMIN (GLUCOPHAGE) 850 MG tablet Take 1 tablet (850 mg total) by mouth every morning. (Patient taking differently: Take 850 mg by mouth daily with breakfast. ) 30 tablet 5 12/04/2017 at Unknown time  . methocarbamol (ROBAXIN) 500 MG tablet Take 1 tablet (500 mg total) by mouth 2 (two) times daily. 14 tablet 0 12/04/2017 at Unknown time  . oxybutynin (DITROPAN) 5 MG tablet TAKE 1 TABLET BY MOUTH 2 TIMES DAILY. (Patient taking differently: Take 5 mg by mouth 2 (two) times daily. ) 60 tablet 6 12/04/2017 at Unknown time  . ranitidine (ZANTAC) 300 MG tablet Take 1  tablet (300 mg total) by mouth at bedtime. (Patient not taking: Reported on 12/04/2017) 30 tablet 5 Not Taking at Unknown time  . spironolactone (ALDACTONE) 50 MG tablet Take 1 tablet (50 mg total) by mouth daily. 30 tablet 5 12/04/2017 at Unknown time  . traZODone (DESYREL) 50 MG tablet Take 25-50 mg by mouth daily as needed for sleep.    Past Month at Unknown time  . Vitamin D, Ergocalciferol, (DRISDOL) 50000 units CAPS capsule Take 1 capsule (50,000 Units total) by mouth every 7 (seven) days. (Patient taking differently: Take 50,000 Units by mouth every 7 (seven) days. On Monday) 16 capsule 0 11/30/2017 at Unknown time    Patient Stressors: Educational concerns Health problems Marital or family conflict  Patient Strengths: Average or above average intelligence Communication skills Religious Affiliation  Treatment Modalities: Medication Management, Group therapy, Case management,  1 to 1 session with clinician, Psychoeducation, Recreational therapy.   Physician Treatment Plan for Primary Diagnosis: Acute stress reaction Long Term Goal(s): Improvement in symptoms so as ready for discharge Improvement in symptoms so as ready for discharge   Short Term Goals: Ability to verbalize feelings will improve Ability to demonstrate self-control will improve Ability to identify and develop effective coping behaviors will improve Ability to identify and develop effective coping behaviors will improve Ability to maintain clinical measurements within normal limits will improve Compliance with prescribed medications will improve  Medication Management: Evaluate patient's response, side effects, and tolerance of medication regimen.  Therapeutic Interventions: 1 to 1 sessions, Unit Group sessions and Medication administration.  Evaluation of Outcomes: Not Met  Physician Treatment Plan for Secondary Diagnosis: Principal Problem:   Acute stress reaction Active Problems:   Bipolar 1 disorder, mixed,  severe ([place])  Long Term Goal(s): Improvement in symptoms so as ready for discharge Improvement in symptoms so as ready for discharge   Short Term Goals: Ability to verbalize feelings will improve Ability to demonstrate self-control will improve Ability to identify and develop effective coping behaviors will improve Ability to identify and develop effective coping behaviors will improve Ability to maintain clinical measurements within normal limits will improve Compliance with prescribed medications will improve     Medication Management: Evaluate patient's response, side effects, and tolerance of medication regimen.  Therapeutic Interventions: 1 to 1 sessions, Unit Group sessions and Medication administration.  Evaluation of Outcomes: Not Met   RN Treatment Plan for Primary Diagnosis: Acute stress reaction Long Term Goal(s): Knowledge of disease and therapeutic regimen to maintain health will improve  Short Term Goals: Ability to  verbalize frustration and anger appropriately will improve, Ability to demonstrate self-control, Ability to participate in decision making will improve, Ability to identify and develop effective coping behaviors will improve and Compliance with prescribed medications will improve  Medication Management: RN will administer medications as ordered by provider, will assess and evaluate patient's response and provide education to patient for prescribed medication. RN will report any adverse and/or side effects to prescribing provider.  Therapeutic Interventions: 1 on 1 counseling sessions, Psychoeducation, Medication administration, Evaluate responses to treatment, Monitor vital signs and CBGs as ordered, Perform/monitor CIWA, COWS, AIMS and Fall Risk screenings as ordered, Perform wound care treatments as ordered.  Evaluation of Outcomes: Not Met   LCSW Treatment Plan for Primary Diagnosis: Acute stress reaction Long Term Goal(s): Safe transition to appropriate  next level of care at discharge, Engage patient in therapeutic group addressing interpersonal concerns.  Short Term Goals: Engage patient in aftercare planning with referrals and resources and Increase skills for wellness and recovery  Therapeutic Interventions: Assess for all discharge needs, 1 to 1 time with Social worker, Explore available resources and support systems, Assess for adequacy in community support network, Educate family and significant other(s) on suicide prevention, Complete Psychosocial Assessment, Interpersonal group therapy.  Evaluation of Outcomes: Not Met   Progress in Treatment: Attending groups: Yes. Participating in groups: Yes. Taking medication as prescribed: Yes. Toleration medication: Yes. Family/Significant other contact made: Yes, individual(s) contacted:  the patient's sister Patient understands diagnosis: Yes. Limited insight  Discussing patient identified problems/goals with staff: Yes. Medical problems stabilized or resolved: Yes. Denies suicidal/homicidal ideation: Yes. Issues/concerns per patient self-inventory: No. Other:   New problem(s) identified: None  New Short Term/Long Term Goal(s): medication stabilization, elimination of SI thoughts, development of comprehensive mental wellness plan.   Patient Goals:    Discharge Plan or Barriers: CSW will assess for appropriate referrals and discharge plans.   Reason for Continuation of Hospitalization: Anxiety Depression Hallucinations Medication stabilization Other; describe [place] behaviors; Psychosis  Estimated Length of Stay: 12/11/17  Attendees: Patient: 12/07/2017 4:17 PM  Physician: Dr. Maris Berger, MD 12/07/2017 4:17 PM  Nursing: Benjamine Mola.A, RN 12/07/2017 4:17 PM  RN Care Manager: X 12/07/2017 4:17 PM  Social Worker: Radonna Ricker, Makena 12/07/2017 4:17 PM  Recreational Therapist: X 12/07/2017 4:17 PM  Other: X 12/07/2017 4:17 PM  Other: X 12/07/2017 4:17 PM  Other:X 12/07/2017 4:17 PM     Scribe for Treatment Team: Marylee Floras, [place] 12/07/2017 4:17 PM

## 2017-12-07 NOTE — [facility] Suicide Risk Assessment (Signed)
Sabrina [name] INPATIENT:  Family/Significant Other Suicide Prevention Education  Suicide Prevention Education:  Education Completed;  Sabrina [name], sister, [phone],  has been identified by the patient as the family member/significant other with whom the patient will be residing, and identified as the person(s) who will aid the patient in the event of a mental health crisis (suicidal ideations/suicide attempt).  With written consent from the patient, the family member/significant other has been provided the following suicide prevention education, prior to the and/or following the discharge of the patient.  The suicide prevention education provided includes the following:  Suicide risk factors  Suicide prevention and interventions  National Suicide Hotline telephone number  [facility] assessment telephone number  [facility] Emergency Assistance [place] and/or Residential Mobile Crisis Unit telephone number  Request made of family/significant other to:  Remove weapons (e.g., guns, rifles, knives), all items previously/currently identified as safety concern.    Remove drugs/medications (over-the-counter, prescriptions, illicit drugs), all items previously/currently identified as a safety concern.  The family member/significant other verbalizes understanding of the suicide prevention education information provided.  The family member/significant other agrees to remove the items of safety concern listed above.  CSW discussed with sister that pt is stating she does not want to go to [facility] at this time.  Sister frustrated but agreeable to continue to be supportive.  There are other family members in [place] that will be supports as well.    Joanne Chars, LCSW 12/07/2017, 4:12 PM

## 2017-12-07 NOTE — Progress Notes (Signed)
D:Pt informed the writer that she's scheduled to be discharged tomorrow. When asked her plans after discharge pt stated, "doing the thing I have to do. Cooking and cleaning". Stated, she plans to return to her home.  Pt has no questions or concerns.   A:  Support and encouragement was offered. 15 min checks continued for safety.  R: Pt remains safe.
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
## 2017-12-07 NOTE — Progress Notes (Signed)
Recreation Therapy Notes  INPATIENT RECREATION THERAPY ASSESSMENT  Patient Details Name: Sabrina [name] MRN: [id number] DOB: Oct 24, 1960 Today's Date: 12/07/2017       Information Obtained From: Patient  Able to Participate in Assessment/Interview: Yes  Patient Presentation: Alert  Reason for Admission (Per Patient): Other (Comments)(Pt stated medication adjustment.)  Patient Stressors: Other (Comment)(Anxiety; agonizing over things)  Coping Skills:   Isolation, TV, Meditate, Deep Breathing, Talk, Prayer, Avoidance, Read  Leisure Interests (2+):  [place] (Comment), Individual - Other (Comment)([place], Social worker)  Frequency of Recreation/Participation: Weekly  Awareness of Community Resources:  Yes  Community Resources:  [place], Other (Comment)(Stores)  Current Use: Yes  If no, Barriers?:    Expressed Interest in [place]: No  [employer] of Residence:  [place]  Patient Main Form of Transportation: Diplomatic Services operational officer  Patient Strengths:  Trustworthy; adaptable  Patient Identified Areas of Improvement:  Dealing with anxiety; job  Patient Goal for Hospitalization:  "Do everything I'm supposed to to discharge".  Current SI (including self-harm):  No  Current HI:  No  Current AVH: No  Staff Intervention Plan: Group Attendance, Collaborate with Interdisciplinary Treatment Team  Consent to Intern Participation: N/A   Victorino Sparrow, LRT/CTRS  Victorino Sparrow A 12/07/2017, 2:21 PM

## 2017-12-07 NOTE — Progress Notes (Signed)
Adult Psychoeducational Group Note  Date:  12/07/2017 Time:  8:43 PM  Group Topic/Focus:  Wrap-Up Group:   The focus of this group is to help patients review their daily goal of treatment and discuss progress on daily workbooks.  Participation Level:  Active  Participation Quality:  Appropriate  Affect:  Appropriate  Cognitive:  Appropriate  Insight: Appropriate  Engagement in Group:  Engaged  Modes of Intervention:  Discussion  Additional Comments: The patient expressed that she rates today a 9.The patient also said that he attended groups.  Sabrina [name] 12/07/2017, 8:43 PM

## 2017-12-08 MED ORDER — MELOXICAM 7.5 MG PO TABS: 7.5000 mg | ORAL_TABLET | Freq: Every day | ORAL | Status: DC | PRN

## 2017-12-08 MED ORDER — HYDRALAZINE HCL 25 MG PO TABS: 25.0000 mg | ORAL_TABLET | Freq: Three times a day (TID) | ORAL | 5 refills | Status: DC

## 2017-12-08 MED ORDER — TRAZODONE HCL 50 MG PO TABS: 50.0000 mg | ORAL_TABLET | Freq: Every evening | ORAL | 0 refills | Status: DC | PRN

## 2017-12-08 MED ORDER — SPIRONOLACTONE 50 MG PO TABS: 50.0000 mg | ORAL_TABLET | Freq: Every day | ORAL | Status: DC

## 2017-12-08 MED ORDER — LURASIDONE HCL 40 MG PO TABS: 40.0000 mg | ORAL_TABLET | Freq: Every day | ORAL | 0 refills | Status: DC

## 2017-12-08 MED ORDER — CLONAZEPAM 0.5 MG PO TABS: 0.5000 mg | ORAL_TABLET | Freq: Two times a day (BID) | ORAL | 0 refills | Status: DC | PRN

## 2017-12-08 MED ORDER — LOSARTAN POTASSIUM 100 MG PO TABS: 100.0000 mg | ORAL_TABLET | Freq: Every evening | ORAL | 5 refills | Status: DC

## 2017-12-08 MED ORDER — OXYBUTYNIN CHLORIDE 5 MG PO TABS: 5.0000 mg | ORAL_TABLET | Freq: Two times a day (BID) | ORAL | 6 refills | Status: DC

## 2017-12-08 MED ORDER — HYDROXYZINE HCL 25 MG PO TABS: 25.0000 mg | ORAL_TABLET | Freq: Four times a day (QID) | ORAL | 0 refills | Status: DC | PRN

## 2017-12-08 MED ORDER — DIVALPROEX SODIUM ER 250 MG PO TB24: 750.0000 mg | ORAL_TABLET | Freq: Every day | ORAL | 0 refills | Status: AC

## 2017-12-08 MED ORDER — METFORMIN HCL 850 MG PO TABS: 850.0000 mg | ORAL_TABLET | Freq: Every day | ORAL | Status: DC

## 2017-12-08 MED ORDER — ASPIRIN EC 81 MG PO TBEC: 81.0000 mg | DELAYED_RELEASE_TABLET | Freq: Every day | ORAL | Status: AC

## 2017-12-08 NOTE — Progress Notes (Signed)
Patient discharged to lobby. Patient was stable and appreciative at that time. All papers and prescriptions were given and valuables returned. Verbal understanding expressed. Denies SI/HI and A/VH. Patient given opportunity to express concerns and ask questions.  
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
## 2017-12-08 NOTE — Progress Notes (Signed)
  [facility] Adult Case Management Discharge Plan :  Will you be returning to the same living situation after discharge:  Yes,  home At discharge, do you have transportation home?: Yes,  [place] Do you have the ability to pay for your medications: Yes,  mental health  Release of information consent forms completed and in the chart;  Patient's signature needed at discharge.  Patient to Follow up at: Follow-up Information    [facility]. Go on 12/09/2017.   Why:  Your next medication management appointment with Gilberto Better is Wednesday, 12/09/17, at 8:15am.  Your next therapy appointment with Roxan Diesel is Monday, 12/28/17, at 3:00pm. Contact information: [place] [address] [phone]        [facility] Follow up.   Contact information: [address] [place] [address] [phone]          Next level of care provider has access to [facility] and Suicide Prevention discussed: Yes,  yes     Has patient been referred to the Quitline?: N/A patient is not a smoker  Patient has been referred for addiction treatment: [place], LCSW 12/08/2017, 9:07 AM

## 2017-12-08 NOTE — Progress Notes (Signed)
Recreation Therapy Notes  Date: 9.10.19 Time: 1000 Location: [address] Dayroom  Group Topic: Anger Management  Goal Area(s) Addresses:  Patient will identify triggers for anger.  Patient will identify physical reaction to anger.   Patient will identify benefit of using coping skills when angry.  Intervention: Worksheet, pencils  Activity: Anger Thermometer.  Patients were to rank at least 8 things that get them angry on the thermometer from 1- 10 ( 1 being the lowest and 10 being the highest).  Patients were to then identify five coping skills they can use to help them deal with their anger.  Education: Anger Management, Discharge Planning   Education Outcome: Acknowledges education/In group clarification offered/Needs additional education.   Clinical Observations/Feedback: Patient did not attend group.    Victorino Sparrow, LRT/CTRS          Victorino Sparrow A 12/08/2017 11:34 AM
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
## 2017-12-08 NOTE — Progress Notes (Signed)
Adult Psychoeducational Group Note  Date:  12/08/2017 Time:  10:13 AM  Group Topic/Focus:  Goals Group:   The focus of this group is to help patients establish daily goals to achieve during treatment and discuss how the patient can incorporate goal setting into their daily lives to aide in recovery.  Participation Level:  Active  Participation Quality:  Appropriate  Affect:  Appropriate  Cognitive:  Appropriate  Insight: Appropriate  Engagement in Group:  Engaged  Modes of Intervention:  Discussion  Additional Comments:  Pt came to group and had a goal of beginning her discharge plan. Pt wants to talk about health to staff and follow directives.   Tyrell Antonio Roshelle Traub 12/08/2017, 10:13 AM

## 2017-12-08 NOTE — Progress Notes (Signed)
Recreation Therapy Notes  INPATIENT RECREATION TR PLAN  Patient Details Name: Sabrina [name] MRN: [id number] DOB: 12-07-1960 Today's Date: 12/08/2017  Rec Therapy Plan Is patient appropriate for Therapeutic Recreation?: Yes Treatment times per week: about 3 days Estimated Length of Stay: 5-7 days TR Treatment/Interventions: Group participation (Comment)  Discharge Criteria Pt will be discharged from therapy if:: Discharged Treatment plan/goals/alternatives discussed and agreed upon by:: Patient/family  Discharge Summary Short term goals set: See patient care plan Short term goals met: Adequate for discharge Progress toward goals comments: Groups attended Which groups?: Goal setting Reason goals not met: Pt being discharged Therapeutic equipment acquired: None Reason patient discharged from therapy: Discharge from hospital Pt/family agrees with progress & goals achieved: Yes Date patient discharged from therapy: 12/08/17   Victorino Sparrow, LRT/CTRS  [name], [name] 12/08/2017, 12:27 PM

## 2017-12-08 NOTE — Progress Notes (Signed)
Recreation Therapy Notes  9.10.19 1257:  LRT met with patient when she returned from lunch.  LRT gave patient a handout on stress management techniques.  LRT briefly went over with patient some of the techniques discussed in the packet.  Pt was receptive and thankful.   Victorino Sparrow, LRT/CTRS     [name], Billiejo Sorto A 12/08/2017 2:04 PM

## 2017-12-08 NOTE — Plan of Care (Signed)
Pt was appropriate when in recreation therapy group.   Victorino Sparrow, LRT/CTRS

## 2017-12-10 MED FILL — ?METFORMIN HCL 850 MG TABLE: 850 | 30 days supply | Qty: 30 | Fill #2

## 2017-12-10 MED FILL — LOSARTAN POTASSIUM 100 MG T: 100 | 30 days supply | Qty: 30 | Fill #2

## 2017-12-11 ENCOUNTER — Encounter (INDEPENDENT_AMBULATORY_CARE_PROVIDER_SITE_OTHER): Payer: Self-pay | Admitting: Orthopaedic Surgery

## 2017-12-11 ENCOUNTER — Ambulatory Visit (INDEPENDENT_AMBULATORY_CARE_PROVIDER_SITE_OTHER): Payer: Self-pay | Admitting: Orthopaedic Surgery

## 2017-12-11 VITALS — BP 118/83 | HR 86 | Ht 64.0 in | Wt 249.0 lb

## 2017-12-11 DIAGNOSIS — M545 Low back pain: Secondary | ICD-10-CM
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
## 2017-12-21 MED FILL — ?CETIRIZINE HCL 10 MG TABLE: 10 | 30 days supply | Qty: 30 | Fill #3

## 2017-12-22 ENCOUNTER — Emergency Department (HOSPITAL_COMMUNITY): Payer: No Typology Code available for payment source

## 2017-12-22 ENCOUNTER — Other Ambulatory Visit: Payer: Self-pay

## 2017-12-22 ENCOUNTER — Encounter (HOSPITAL_COMMUNITY): Payer: Self-pay | Admitting: *Deleted

## 2017-12-22 ENCOUNTER — Emergency Department (HOSPITAL_COMMUNITY)
Admission: EM | Admit: 2017-12-22 | Discharge: 2017-12-22 | Disposition: A | Discharge status: Home / Self Care | Payer: No Typology Code available for payment source | Attending: Emergency Medicine | Admitting: Emergency Medicine

## 2017-12-22 DIAGNOSIS — E119 Type 2 diabetes mellitus without complications: Secondary | ICD-10-CM | POA: Diagnosis not present

## 2017-12-22 DIAGNOSIS — I10 Essential (primary) hypertension: Secondary | ICD-10-CM | POA: Diagnosis not present

## 2017-12-22 DIAGNOSIS — Z79899 Other long term (current) drug therapy: Secondary | ICD-10-CM | POA: Diagnosis not present

## 2017-12-22 DIAGNOSIS — Z7984 Long term (current) use of oral hypoglycemic drugs: Secondary | ICD-10-CM | POA: Diagnosis not present

## 2017-12-22 DIAGNOSIS — Z590 Homelessness: Secondary | ICD-10-CM | POA: Diagnosis not present

## 2017-12-22 DIAGNOSIS — R2241 Localized swelling, mass and lump, right lower limb: Secondary | ICD-10-CM | POA: Diagnosis not present

## 2017-12-22 DIAGNOSIS — Z7982 Long term (current) use of aspirin: Secondary | ICD-10-CM | POA: Diagnosis not present

## 2017-12-22 DIAGNOSIS — Z7722 Contact with and (suspected) exposure to environmental tobacco smoke (acute) (chronic): Secondary | ICD-10-CM | POA: Insufficient documentation

## 2017-12-22 DIAGNOSIS — M7989 Other specified soft tissue disorders: Secondary | ICD-10-CM

## 2017-12-22 DIAGNOSIS — M79661 Pain in right lower leg: Secondary | ICD-10-CM

## 2017-12-22 DIAGNOSIS — R569 Unspecified convulsions: Secondary | ICD-10-CM | POA: Diagnosis not present

## 2017-12-22 DIAGNOSIS — M069 Rheumatoid arthritis, unspecified: Secondary | ICD-10-CM | POA: Diagnosis not present

## 2017-12-22 LAB — CBC
HCT: 37.6 % (ref 36.0–46.0)
Hemoglobin: 12 g/dL (ref 12.0–15.0)
MCH: 31.5 pg (ref 26.0–34.0)
MCHC: 31.9 g/dL (ref 30.0–36.0)
MCV: 98.7 fL (ref 78.0–100.0)
PLATELETS: 489 10*3/uL — AB (ref 150–400)
RBC: 3.81 MIL/uL — AB (ref 3.87–5.11)
RDW: 14.4 % (ref 11.5–15.5)
WBC: 11.3 10*3/uL — ABNORMAL HIGH (ref 4.0–10.5)

## 2017-12-22 LAB — BASIC METABOLIC PANEL
Anion gap: 11 (ref 5–15)
BUN: 7 mg/dL (ref 6–20)
CO2: 27 mmol/L (ref 22–32)
Calcium: 10.1 mg/dL (ref 8.9–10.3)
Chloride: 102 mmol/L (ref 98–111)
Creatinine, Ser: 0.94 mg/dL (ref 0.44–1.00)
GFR calc Af Amer: >60 mL/min (ref >60)
Glucose, Bld: 95 mg/dL (ref 70–99)
Potassium: 4.5 mmol/L (ref 3.5–5.1)
SODIUM: 140 mmol/L (ref 135–145)

## 2017-12-22 MED ORDER — ACETAMINOPHEN 500 MG PO TABS
1000.0000 mg | ORAL_TABLET | Freq: Once | ORAL | Status: AC
  Administered 2017-12-22: 1000 mg via ORAL
  Filled 2017-12-22: qty 2

## 2017-12-22 NOTE — Discharge Instructions (Signed)
Your labs overall look good today, and your ultrasound shows no evidence of blood clot in your leg.  You will need to follow-up with your primary care doctor for further evaluation of your restless symptoms.  You can try Tylenol, or using heat over the area.  Return to the emergency department for worsening swelling of the leg, redness or warmth, numbness or weakness, difficulty moving any of your joints, chest pain or shortness of breath or any other new or concerning symptoms.

## 2017-12-22 NOTE — ED Triage Notes (Addendum)
Pt in c/o fatigue and restless leg for 3-4 wks, pt reports R leg restless leg syndrome without diagnosis from MD, pt states, "I don't know if my iron is low or not." pt denies SOB, A&O x4

## 2017-12-22 NOTE — ED Provider Notes (Signed)
Patient placed in Quick Look pathway, seen and evaluated   Chief Complaint: Right leg jittery  HPI: Patient was advised to come to the emergency department by nursing hotline.  She states that for the past month she has had jitteriness in her right leg which keeps her up at night.  She states that she believes she has restless leg syndrome, although has never been formally diagnosed.  She reports that the right leg is chronically swollen compared to the left, is unsure if she has worsening swelling recently.  She denies numbness, weakness.  ROS: No shortness of breath  Physical Exam:   Gen: No distress  Neuro: Awake and Alert  Skin: Warm    Focused Exam: Right leg swollen compared to left.  No erythema or warmth.  No pitting edema.  There is a 4 cm scar just distal to the right knee with no overlying tenderness.  Sensation to light touch intact in bilateral lower extremities.   Initiation of care has begun. The patient has been counseled on the process, plan, and necessity for staying for the completion/evaluation, and the remainder of the medical screening examination    Bernarda Caffey 12/22/17 [place], [place], DO 12/22/17 1706
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
## 2017-12-23 ENCOUNTER — Telehealth: Payer: Self-pay

## 2017-12-23 NOTE — Telephone Encounter (Signed)
As per Abelino Derrick, [facility], they have initiated contact with the patient.

## 2017-12-24 ENCOUNTER — Ambulatory Visit: Payer: Self-pay | Admitting: Licensed Clinical Social Worker

## 2017-12-24 ENCOUNTER — Ambulatory Visit: Payer: Self-pay | Attending: Internal Medicine | Admitting: Family Medicine

## 2017-12-24 ENCOUNTER — Encounter: Payer: Self-pay | Admitting: Family Medicine

## 2017-12-24 VITALS — BP 103/72 | HR 78 | Temp 97.7°F | Resp 16 | Wt 242.8 lb

## 2017-12-24 DIAGNOSIS — Z8249 Family history of ischemic heart disease and other diseases of the circulatory system: Secondary | ICD-10-CM | POA: Insufficient documentation

## 2017-12-24 DIAGNOSIS — Z9889 Other specified postprocedural states: Secondary | ICD-10-CM | POA: Insufficient documentation

## 2017-12-24 DIAGNOSIS — M545 Low back pain: Secondary | ICD-10-CM

## 2017-12-24 DIAGNOSIS — E119 Type 2 diabetes mellitus without complications: Secondary | ICD-10-CM | POA: Insufficient documentation

## 2017-12-24 DIAGNOSIS — F3162 Bipolar disorder, current episode mixed, moderate: Secondary | ICD-10-CM

## 2017-12-24 DIAGNOSIS — F331 Major depressive disorder, recurrent, moderate: Secondary | ICD-10-CM

## 2017-12-24 DIAGNOSIS — Z6841 Body Mass Index (BMI) 40.0 and over, adult: Secondary | ICD-10-CM | POA: Insufficient documentation

## 2017-12-24 DIAGNOSIS — Z833 Family history of diabetes mellitus: Secondary | ICD-10-CM | POA: Insufficient documentation

## 2017-12-24 DIAGNOSIS — I10 Essential (primary) hypertension: Secondary | ICD-10-CM | POA: Insufficient documentation

## 2017-12-24 DIAGNOSIS — Z7722 Contact with and (suspected) exposure to environmental tobacco smoke (acute) (chronic): Secondary | ICD-10-CM | POA: Insufficient documentation

## 2017-12-24 DIAGNOSIS — M5442 Lumbago with sciatica, left side: Secondary | ICD-10-CM | POA: Insufficient documentation

## 2017-12-24 DIAGNOSIS — E785 Hyperlipidemia, unspecified: Secondary | ICD-10-CM | POA: Insufficient documentation

## 2017-12-24 DIAGNOSIS — E669 Obesity, unspecified: Secondary | ICD-10-CM

## 2017-12-24 DIAGNOSIS — Z888 Allergy status to other drugs, medicaments and biological substances status: Secondary | ICD-10-CM | POA: Insufficient documentation

## 2017-12-24 DIAGNOSIS — E1169 Type 2 diabetes mellitus with other specified complication: Secondary | ICD-10-CM

## 2017-12-24 DIAGNOSIS — M79671 Pain in right foot: Secondary | ICD-10-CM

## 2017-12-24 DIAGNOSIS — F319 Bipolar disorder, unspecified: Secondary | ICD-10-CM | POA: Insufficient documentation

## 2017-12-24 DIAGNOSIS — M542 Cervicalgia: Secondary | ICD-10-CM

## 2017-12-24 DIAGNOSIS — M5441 Lumbago with sciatica, right side: Secondary | ICD-10-CM | POA: Insufficient documentation

## 2017-12-24 DIAGNOSIS — F411 Generalized anxiety disorder: Secondary | ICD-10-CM

## 2017-12-24 DIAGNOSIS — Z880 Allergy status to penicillin: Secondary | ICD-10-CM | POA: Insufficient documentation

## 2017-12-24 LAB — GLUCOSE, POCT (MANUAL RESULT ENTRY): POC Glucose: 120 mg/dL — AB (ref 70–99)

## 2017-12-24 MED ORDER — IBUPROFEN 600 MG PO TABS: 600.0000 mg | ORAL_TABLET | Freq: Three times a day (TID) | ORAL | 0 refills | Status: DC | PRN

## 2017-12-24 MED FILL — SPIRONOLACTONE 50 MG TABLET: 50 | 30 days supply | Qty: 30 | Fill #4

## 2017-12-24 MED FILL — IBUPROFEN 600 MG TABLET: 600 | 10 days supply | Qty: 30 | Fill #0
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
## 2017-12-24 NOTE — Patient Instructions (Signed)
Acute Pain, Adult  Acute pain is a type of pain that may last for just a few days or as long as six months. It is often related to an illness, injury, or medical procedure. Acute pain may be mild, moderate, or severe. It usually goes away once your injury has healed or you are no longer ill.  Pain can make it hard for you to do daily activities. It can cause anxiety and lead to other problems if left untreated. Treatment depends on the cause and severity of your acute pain.  Follow these instructions at home:   Check your pain level as told by your health care provider.   Take over-the-counter and prescription medicines only as told by your health care provider.   If you are taking prescription pain medicine:  ? Ask your health care provider about taking a stool softener or laxative to prevent constipation.  ? Do not stop taking the medicine suddenly. Talk to your health care provider about how and when to discontinue prescription pain medicine.  ? If your pain is severe, do not take more pills than instructed by your health care provider.  ? Do not take other over-the-counter pain medicines in addition to this medicine unless told by your health care provider.  ? Do not drive or operate heavy machinery while taking prescription pain medicine.   Apply ice or heat as told by your health care provider. These may reduce swelling and pain.   Ask your health care provider if other strategies such as distraction, relaxation, or physical therapies can help your pain.   Keep all follow-up visits as told by your health care provider. This is important.  Contact a health care provider if:   You have pain that is not controlled by medicine.   Your pain does not improve or gets worse.   You have side effects from pain medicines, such as vomitingor confusion.  Get help right away if:   You have severe pain.   You have trouble breathing.   You lose consciousness.   You have chest pain or pressure that lasts for more  than a few minutes. Along with the chest pain you may:  ? Have pain or discomfort in one or both arms, your back, neck, jaw, or stomach.  ? Have shortness of breath.  ? Break out in a cold sweat.  ? Feel nauseous.  ? Become light-headed.  These symptoms may represent a serious problem that is an emergency. Do not wait to see if the symptoms will go away. Get medical help right away. Call your local emergency services (911 in the [place] Do not drive yourself to the hospital.  This information is not intended to replace advice given to you by your health care provider. Make sure you discuss any questions you have with your health care provider.  Document Released: 04/01/2015 Document Revised: 08/24/2015 Document Reviewed: 04/01/2015  Elsevier Interactive Patient Education  2018 Elsevier Inc.

## 2017-12-24 NOTE — Progress Notes (Signed)
Pt states she is having pain come from her neck and right foot

## 2017-12-29 ENCOUNTER — Ambulatory Visit: Payer: No Typology Code available for payment source | Admitting: Internal Medicine

## 2017-12-31 ENCOUNTER — Ambulatory Visit (INDEPENDENT_AMBULATORY_CARE_PROVIDER_SITE_OTHER): Payer: Self-pay | Admitting: Surgery

## 2018-01-01 ENCOUNTER — Ambulatory Visit (INDEPENDENT_AMBULATORY_CARE_PROVIDER_SITE_OTHER): Payer: Self-pay

## 2018-01-01 ENCOUNTER — Ambulatory Visit (INDEPENDENT_AMBULATORY_CARE_PROVIDER_SITE_OTHER): Payer: Self-pay | Admitting: Orthopaedic Surgery

## 2018-01-01 ENCOUNTER — Ambulatory Visit (INDEPENDENT_AMBULATORY_CARE_PROVIDER_SITE_OTHER): Payer: Self-pay | Admitting: Surgery

## 2018-01-01 ENCOUNTER — Encounter (INDEPENDENT_AMBULATORY_CARE_PROVIDER_SITE_OTHER): Payer: Self-pay | Admitting: Surgery

## 2018-01-01 VITALS — BP 101/73 | HR 73 | Ht 64.0 in | Wt 242.0 lb

## 2018-01-01 DIAGNOSIS — M25561 Pain in right knee: Secondary | ICD-10-CM

## 2018-01-01 DIAGNOSIS — M79671 Pain in right foot: Secondary | ICD-10-CM

## 2018-01-01 DIAGNOSIS — M5416 Radiculopathy, lumbar region: Secondary | ICD-10-CM
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
## 2018-01-06 MED FILL — ?HYDRALAZINE 25MG TAB: 25 | 30 days supply | Qty: 90 | Fill #2

## 2018-01-11 ENCOUNTER — Ambulatory Visit: Payer: Self-pay | Attending: Internal Medicine | Admitting: Internal Medicine

## 2018-01-11 ENCOUNTER — Encounter: Payer: Self-pay | Admitting: Internal Medicine

## 2018-01-11 VITALS — BP 112/73 | HR 82 | Temp 98.3°F | Resp 16 | Wt 243.2 lb

## 2018-01-11 DIAGNOSIS — Z825 Family history of asthma and other chronic lower respiratory diseases: Secondary | ICD-10-CM | POA: Insufficient documentation

## 2018-01-11 DIAGNOSIS — Z590 Homelessness: Secondary | ICD-10-CM | POA: Insufficient documentation

## 2018-01-11 DIAGNOSIS — K219 Gastro-esophageal reflux disease without esophagitis: Secondary | ICD-10-CM | POA: Insufficient documentation

## 2018-01-11 DIAGNOSIS — E785 Hyperlipidemia, unspecified: Secondary | ICD-10-CM | POA: Insufficient documentation

## 2018-01-11 DIAGNOSIS — Z79899 Other long term (current) drug therapy: Secondary | ICD-10-CM | POA: Insufficient documentation

## 2018-01-11 DIAGNOSIS — Z8042 Family history of malignant neoplasm of prostate: Secondary | ICD-10-CM | POA: Insufficient documentation

## 2018-01-11 DIAGNOSIS — Z888 Allergy status to other drugs, medicaments and biological substances status: Secondary | ICD-10-CM | POA: Insufficient documentation

## 2018-01-11 DIAGNOSIS — Z91040 Latex allergy status: Secondary | ICD-10-CM | POA: Insufficient documentation

## 2018-01-11 DIAGNOSIS — Z7984 Long term (current) use of oral hypoglycemic drugs: Secondary | ICD-10-CM | POA: Insufficient documentation

## 2018-01-11 DIAGNOSIS — E119 Type 2 diabetes mellitus without complications: Secondary | ICD-10-CM | POA: Insufficient documentation

## 2018-01-11 DIAGNOSIS — Z818 Family history of other mental and behavioral disorders: Secondary | ICD-10-CM | POA: Insufficient documentation

## 2018-01-11 DIAGNOSIS — Z23 Encounter for immunization: Secondary | ICD-10-CM

## 2018-01-11 DIAGNOSIS — M069 Rheumatoid arthritis, unspecified: Secondary | ICD-10-CM | POA: Insufficient documentation

## 2018-01-11 DIAGNOSIS — Z791 Long term (current) use of non-steroidal anti-inflammatories (NSAID): Secondary | ICD-10-CM | POA: Insufficient documentation

## 2018-01-11 DIAGNOSIS — F319 Bipolar disorder, unspecified: Secondary | ICD-10-CM | POA: Insufficient documentation

## 2018-01-11 DIAGNOSIS — Z8249 Family history of ischemic heart disease and other diseases of the circulatory system: Secondary | ICD-10-CM | POA: Insufficient documentation

## 2018-01-11 DIAGNOSIS — Z823 Family history of stroke: Secondary | ICD-10-CM | POA: Insufficient documentation

## 2018-01-11 DIAGNOSIS — Z880 Allergy status to penicillin: Secondary | ICD-10-CM | POA: Insufficient documentation

## 2018-01-11 DIAGNOSIS — Z7982 Long term (current) use of aspirin: Secondary | ICD-10-CM | POA: Insufficient documentation

## 2018-01-11 DIAGNOSIS — I10 Essential (primary) hypertension: Secondary | ICD-10-CM | POA: Insufficient documentation

## 2018-01-11 DIAGNOSIS — F419 Anxiety disorder, unspecified: Secondary | ICD-10-CM | POA: Insufficient documentation

## 2018-01-11 DIAGNOSIS — J45909 Unspecified asthma, uncomplicated: Secondary | ICD-10-CM | POA: Insufficient documentation

## 2018-01-11 DIAGNOSIS — G4700 Insomnia, unspecified: Secondary | ICD-10-CM | POA: Insufficient documentation

## 2018-01-11 DIAGNOSIS — Z800 Family history of malignant neoplasm of digestive organs: Secondary | ICD-10-CM | POA: Insufficient documentation

## 2018-01-11 DIAGNOSIS — Z803 Family history of malignant neoplasm of breast: Secondary | ICD-10-CM | POA: Insufficient documentation

## 2018-01-11 DIAGNOSIS — G4733 Obstructive sleep apnea (adult) (pediatric): Secondary | ICD-10-CM | POA: Insufficient documentation

## 2018-01-11 DIAGNOSIS — H538 Other visual disturbances: Secondary | ICD-10-CM | POA: Insufficient documentation

## 2018-01-11 DIAGNOSIS — Z7722 Contact with and (suspected) exposure to environmental tobacco smoke (acute) (chronic): Secondary | ICD-10-CM | POA: Insufficient documentation

## 2018-01-11 DIAGNOSIS — R45851 Suicidal ideations: Secondary | ICD-10-CM | POA: Insufficient documentation

## 2018-01-11 MED FILL — OXYBUTYNIN 5 MG TABLET: 5 | 30 days supply | Qty: 60 | Fill #3

## 2018-01-11 MED FILL — ?METFORMIN HCL 850 MG TABLE: 850 | 30 days supply | Qty: 30 | Fill #3

## 2018-01-11 MED FILL — LOSARTAN POTASSIUM 100 MG T: 100 | 30 days supply | Qty: 30 | Fill #3

## 2018-01-11 NOTE — Patient Instructions (Addendum)
You can try taking 1 or 2 of your hydroxyzine at bedtime as needed to help with sleep.  Insomnia Insomnia is a sleep disorder that makes it difficult to fall asleep or to stay asleep. Insomnia can cause tiredness (fatigue), low energy, difficulty concentrating, mood swings, and poor performance at work or school. There are three different ways to classify insomnia:  Difficulty falling asleep.  Difficulty staying asleep.  Waking up too early in the morning.  Any type of insomnia can be long-term (chronic) or short-term (acute). Both are common. Short-term insomnia usually lasts for three months or less. Chronic insomnia occurs at least three times a week for longer than three months. What are the causes? Insomnia may be caused by another condition, situation, or substance, such as:  Anxiety.  Certain medicines.  Gastroesophageal reflux disease (GERD) or other gastrointestinal conditions.  Asthma or other breathing conditions.  Restless legs syndrome, sleep apnea, or other sleep disorders.  Chronic pain.  Menopause. This may include hot flashes.  Stroke.  Abuse of alcohol, tobacco, or illegal drugs.  Depression.  Caffeine.  Neurological disorders, such as Alzheimer disease.  An overactive thyroid (hyperthyroidism).  The cause of insomnia may not be known. What increases the risk? Risk factors for insomnia include:  Gender. Women are more commonly affected than men.  Age. Insomnia is more common as you get older.  Stress. This may involve your professional or personal life.  Income. Insomnia is more common in people with lower income.  Lack of exercise.  Irregular work schedule or night shifts.  Traveling between different time zones.  What are the signs or symptoms? If you have insomnia, trouble falling asleep or trouble staying asleep is the main symptom. This may lead to other symptoms, such as:  Feeling fatigued.  Feeling nervous about going to  sleep.  Not feeling rested in the morning.  Having trouble concentrating.  Feeling irritable, anxious, or depressed.  How is this treated? Treatment for insomnia depends on the cause. If your insomnia is caused by an underlying condition, treatment will focus on addressing the condition. Treatment may also include:  Medicines to help you sleep.  Counseling or therapy.  Lifestyle adjustments.  Follow these instructions at home:  Take medicines only as directed by your health care provider.  Keep regular sleeping and waking hours. Avoid naps.  Keep a sleep diary to help you and your health care provider figure out what could be causing your insomnia. Include: ? When you sleep. ? When you wake up during the night. ? How well you sleep. ? How rested you feel the next day. ? Any side effects of medicines you are taking. ? What you eat and drink.  Make your bedroom a comfortable place where it is easy to fall asleep: ? Put up shades or special blackout curtains to block light from outside. ? Use a white noise machine to block noise. ? Keep the temperature cool.  Exercise regularly as directed by your health care provider. Avoid exercising right before bedtime.  Use relaxation techniques to manage stress. Ask your health care provider to suggest some techniques that may work well for you. These may include: ? Breathing exercises. ? Routines to release muscle tension. ? Visualizing peaceful scenes.  Cut back on alcohol, caffeinated beverages, and cigarettes, especially close to bedtime. These can disrupt your sleep.  Do not overeat or eat spicy foods right before bedtime. This can lead to digestive discomfort that can make it hard for you  to sleep.  Limit screen use before bedtime. This includes: ? Watching TV. ? Using your smartphone, tablet, and computer.  Stick to a routine. This can help you fall asleep faster. Try to do a quiet activity, brush your teeth, and go to bed  at the same time each night.  Get out of bed if you are still awake after 15 minutes of trying to sleep. Keep the lights down, but try reading or doing a quiet activity. When you feel sleepy, go back to bed.  Make sure that you drive carefully. Avoid driving if you feel very sleepy.  Keep all follow-up appointments as directed by your health care provider. This is important. Contact a health care provider if:  You are tired throughout the day or have trouble in your daily routine due to sleepiness.  You continue to have sleep problems or your sleep problems get worse. Get help right away if:  You have serious thoughts about hurting yourself or someone else. This information is not intended to replace advice given to you by your health care provider. Make sure you discuss any questions you have with your health care provider. Document Released: 03/14/2000 Document Revised: 08/17/2015 Document Reviewed: 12/16/2013 Elsevier Interactive Patient Education  2018 [place].  Influenza Virus Vaccine injection (Fluarix) What is this medicine? INFLUENZA VIRUS VACCINE (in floo EN zuh VAHY ruhs vak SEEN) helps to reduce the risk of getting influenza also known as the flu. This medicine may be used for other purposes; ask your health care provider or pharmacist if you have questions. COMMON BRAND NAME(S): Fluarix, Fluzone What should I tell my health care provider before I take this medicine? They need to know if you have any of these conditions: -bleeding disorder like hemophilia -fever or infection -Guillain-Barre syndrome or other neurological problems -immune system problems -infection with the human immunodeficiency virus (HIV) or AIDS -low blood platelet counts -multiple sclerosis -an unusual or allergic reaction to influenza virus vaccine, eggs, chicken proteins, latex, gentamicin, other medicines, foods, dyes or preservatives -pregnant or trying to get pregnant -breast-feeding How  should I use this medicine? This vaccine is for injection into a muscle. It is given by a health care professional. A copy of Vaccine Information Statements will be given before each vaccination. Read this sheet carefully each time. The sheet may change frequently. Talk to your pediatrician regarding the use of this medicine in children. Special care may be needed. Overdosage: If you think you have taken too much of this medicine contact a poison control center or emergency room at once. NOTE: This medicine is only for you. Do not share this medicine with others. What if I miss a dose? This does not apply. What may interact with this medicine? -chemotherapy or radiation therapy -medicines that lower your immune system like etanercept, anakinra, infliximab, and adalimumab -medicines that treat or prevent blood clots like warfarin -phenytoin -steroid medicines like prednisone or cortisone -theophylline -vaccines This list may not describe all possible interactions. Give your health care provider a list of all the medicines, herbs, non-prescription drugs, or dietary supplements you use. Also tell them if you smoke, drink alcohol, or use illegal drugs. Some items may interact with your medicine. What should I watch for while using this medicine? Report any side effects that do not go away within 3 days to your doctor or health care professional. Call your health care provider if any unusual symptoms occur within 6 weeks of receiving this vaccine. You may still catch  the flu, but the illness is not usually as bad. You cannot get the flu from the vaccine. The vaccine will not protect against colds or other illnesses that may cause fever. The vaccine is needed every year. What side effects may I notice from receiving this medicine? Side effects that you should report to your doctor or health care professional as soon as possible: -allergic reactions like skin rash, itching or hives, swelling of the  face, lips, or tongue Side effects that usually do not require medical attention (report to your doctor or health care professional if they continue or are bothersome): -fever -headache -muscle aches and pains -pain, tenderness, redness, or swelling at site where injected -weak or tired This list may not describe all possible side effects. Call your doctor for medical advice about side effects. You may report side effects to FDA at 1-800-FDA-1088. Where should I keep my medicine? This vaccine is only given in a clinic, pharmacy, doctor's office, or other health care setting and will not be stored at home. NOTE: This sheet is a summary. It may not cover all possible information. If you have questions about this medicine, talk to your doctor, pharmacist, or health care provider.  2018 Elsevier/Gold Standard (2007-10-13 09:30:40)

## 2018-01-11 NOTE — Progress Notes (Signed)
Pt states her left side of her neck hurts
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
## 2018-01-12 DIAGNOSIS — G4700 Insomnia, unspecified: Secondary | ICD-10-CM | POA: Insufficient documentation

## 2018-01-15 ENCOUNTER — Other Ambulatory Visit: Payer: Self-pay

## 2018-01-18 ENCOUNTER — Ambulatory Visit: Payer: Self-pay | Admitting: Internal Medicine

## 2018-01-22 ENCOUNTER — Other Ambulatory Visit: Payer: Self-pay | Admitting: Internal Medicine

## 2018-01-22 ENCOUNTER — Ambulatory Visit (INDEPENDENT_AMBULATORY_CARE_PROVIDER_SITE_OTHER): Payer: Self-pay | Admitting: Orthopaedic Surgery

## 2018-01-22 DIAGNOSIS — R059 Cough, unspecified: Secondary | ICD-10-CM

## 2018-01-22 DIAGNOSIS — R05 Cough: Secondary | ICD-10-CM

## 2018-01-22 MED FILL — SPIRONOLACTONE 50 MG TABLET: 50 | 30 days supply | Qty: 30 | Fill #5

## 2018-01-25 MED FILL — ?CETIRIZINE HCL 10 MG TABLE: 10 | 30 days supply | Qty: 30 | Fill #0

## 2018-01-27 ENCOUNTER — Ambulatory Visit
Admission: RE | Admit: 2018-01-27 | Discharge: 2018-01-27 | Disposition: A | Discharge status: Home / Self Care | Payer: No Typology Code available for payment source | Source: Ambulatory Visit | Attending: Surgery | Admitting: Surgery

## 2018-01-27 DIAGNOSIS — M5416 Radiculopathy, lumbar region: Secondary | ICD-10-CM

## 2018-01-29 ENCOUNTER — Telehealth (INDEPENDENT_AMBULATORY_CARE_PROVIDER_SITE_OTHER): Payer: Self-pay

## 2018-01-29 NOTE — Telephone Encounter (Signed)
Holding for Dr Lorin Mercy. MRI scheduled for 11/14

## 2018-01-29 NOTE — Telephone Encounter (Signed)
Patient called stating that she is claustrophobic and would like a Rx before having her MRI done.  Cb# is [phone]. Patient has to R/S her appt.to have MRI done.  Please advise.  Thank you.

## 2018-02-01 NOTE — Telephone Encounter (Signed)
[place] for Valium Rx?

## 2018-02-01 NOTE — Telephone Encounter (Signed)
Holding for Tuesday since scan is not until 11/14

## 2018-02-01 NOTE — Telephone Encounter (Signed)
Ok thanks, Sabrina [name] is with me, not sure how we missed claustrophobia when getting MRI Questions asked. Valium  5 mg.   # 3 tabs, take 2 one hour before scan and take extra with you. Can see if another doctor can sign or hold for Tuesday . thanks

## 2018-02-03 ENCOUNTER — Ambulatory Visit (INDEPENDENT_AMBULATORY_CARE_PROVIDER_SITE_OTHER): Payer: Self-pay | Admitting: Orthopaedic Surgery

## 2018-02-03 MED ORDER — DIAZEPAM 5 MG PO TABS: ORAL_TABLET | ORAL | 0 refills | Status: DC

## 2018-02-03 NOTE — Addendum Note (Signed)
Addended byLaurann [place] on: 02/03/2018 08:46 AM   Modules accepted: Orders

## 2018-02-03 NOTE — Telephone Encounter (Signed)
Talked with patient and advised her that Rx is ready to be picked up at the front desk. 

## 2018-02-03 NOTE — Telephone Encounter (Signed)
Printed for him to sign, please call patient once signed.

## 2018-02-08 MED FILL — OXYBUTYNIN 5 MG TABLET: 5 | 30 days supply | Qty: 60 | Fill #4

## 2018-02-08 MED FILL — metFORMIN HCL 850 MG TABS: 850 | 30 days supply | Qty: 30 | Fill #4

## 2018-02-10 ENCOUNTER — Ambulatory Visit: Payer: Self-pay | Admitting: Podiatry

## 2018-02-10 DIAGNOSIS — Q828 Other specified congenital malformations of skin: Secondary | ICD-10-CM

## 2018-02-10 DIAGNOSIS — M79676 Pain in unspecified toe(s): Secondary | ICD-10-CM

## 2018-02-10 DIAGNOSIS — E1142 Type 2 diabetes mellitus with diabetic polyneuropathy: Secondary | ICD-10-CM

## 2018-02-10 DIAGNOSIS — B351 Tinea unguium: Secondary | ICD-10-CM

## 2018-02-10 NOTE — Patient Instructions (Signed)

## 2018-02-11 ENCOUNTER — Ambulatory Visit: Admission: RE | Admit: 2018-02-11 | Payer: No Typology Code available for payment source | Source: Ambulatory Visit

## 2018-02-11 ENCOUNTER — Telehealth (INDEPENDENT_AMBULATORY_CARE_PROVIDER_SITE_OTHER): Payer: Self-pay | Admitting: Orthopaedic Surgery

## 2018-02-11 ENCOUNTER — Encounter: Payer: Self-pay | Admitting: Podiatry

## 2018-02-11 DIAGNOSIS — M5416 Radiculopathy, lumbar region: Secondary | ICD-10-CM

## 2018-02-11 NOTE — Telephone Encounter (Signed)
Patient came into the clinic stating that she was still unable to complete the MRI at [facility] even though it was the open MRI machine, she still had a panic attack.  She was advised to get Dr. Lorin Mercy to write on order for her to have the MRI at the hospital where she can be put to sleep.  CB#[phone].

## 2018-02-11 NOTE — Telephone Encounter (Signed)
Please advise 

## 2018-02-11 NOTE — Progress Notes (Signed)
Subjective: Sabrina [name] presents today for diabetic foot care with  cc of painful, discolored, thick toenails which  is aggravated when wearing enclosed shoe gear. Pain is relieved with periodic professional debridement.  She also has painful calluses on the plantar aspect of both feet which interfere with ambulation and put her at risk of wounds due to diabetes.  Objective: General: 57 yo AAF, morbidly obese, in NAD. AAO x 3.  Vascular Examination: Capillary refill time <3 seconds x 10 digits Dorsalis pedis and posterior tibial pulses present b/l No digital hair x 10 digits Skin temperature warm to warm b/l Trace edema b/l ankles, nonpitting in nature  Dermatological Examination: Pedal skin is dry and supple Toenails 1-5 b/l discolored, thick, dystrophic with subungual debris and pain with palpation to nailbeds due to thickness of nails. No open wounds No interdigital macerations Hyperkeratotic lesions x 4 with central nucleated core noted plantar aspect of both ffet  Musculoskeletal: Muscle strength 5/5 to all LE muscle groups  Neurological: Sensation diminished with 10 gram monofilament. Vibratory sensation diminished  Assessment: 1. Painful onychomycosis toenails 1-5 b/l  2. Painful porokeratotic lesions x 4 3. NIDDM with peripheral neuropathy  Plan: 1. Toenails 1-5 b/l were debrided in length and girth without iatrogenic bleeding. 2. Porokeratotic lesions excised/pared without incident 3. Patient to continue soft, supportive shoe gear 4. Patient to report any pedal injuries to medical professional immediately. 5. Follow up 3 months. Patient/POA to call should there be a concern in the interim.

## 2018-02-11 NOTE — Telephone Encounter (Signed)
Send her for lumbar CT scan thanks. May repeat valium if needed. She tolerated a CT Maxillofacial in the past.

## 2018-02-12 NOTE — Telephone Encounter (Signed)
I called patient and advised. She believes she can get through CT scan without medication.  Order entered.

## 2018-02-15 ENCOUNTER — Ambulatory Visit
Admission: RE | Admit: 2018-02-15 | Discharge: 2018-02-15 | Disposition: A | Discharge status: Home / Self Care | Payer: No Typology Code available for payment source | Source: Ambulatory Visit | Attending: Orthopaedic Surgery | Admitting: Orthopaedic Surgery

## 2018-02-15 DIAGNOSIS — M5416 Radiculopathy, lumbar region: Secondary | ICD-10-CM

## 2018-02-16 ENCOUNTER — Encounter (INDEPENDENT_AMBULATORY_CARE_PROVIDER_SITE_OTHER): Payer: Self-pay | Admitting: Orthopaedic Surgery

## 2018-02-16 ENCOUNTER — Ambulatory Visit (INDEPENDENT_AMBULATORY_CARE_PROVIDER_SITE_OTHER): Payer: Self-pay | Admitting: Orthopaedic Surgery

## 2018-02-16 VITALS — BP 127/75 | HR 75 | Ht 64.0 in | Wt 244.0 lb

## 2018-02-16 DIAGNOSIS — G5761 Lesion of plantar nerve, right lower limb: Secondary | ICD-10-CM
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
## 2018-02-17 ENCOUNTER — Telehealth: Payer: Self-pay

## 2018-02-17 MED FILL — LOSARTAN POTASSIUM 100 MG T: 100 | 30 days supply | Qty: 30 | Fill #4

## 2018-02-17 NOTE — Telephone Encounter (Signed)
As per Abelino Derrick, [employer] [facility], the case is on-going.

## 2018-02-23 ENCOUNTER — Ambulatory Visit (INDEPENDENT_AMBULATORY_CARE_PROVIDER_SITE_OTHER): Payer: Self-pay | Admitting: Orthopaedic Surgery

## 2018-02-23 ENCOUNTER — Encounter (INDEPENDENT_AMBULATORY_CARE_PROVIDER_SITE_OTHER): Payer: Self-pay | Admitting: Orthopaedic Surgery

## 2018-02-23 VITALS — BP 132/73 | HR 68 | Ht 64.0 in | Wt 242.0 lb

## 2018-02-23 DIAGNOSIS — G5761 Lesion of plantar nerve, right lower limb: Secondary | ICD-10-CM | POA: Insufficient documentation

## 2018-02-23 MED ORDER — BUPIVACAINE HCL 0.25 % IJ SOLN
0.3300 mL | INTRAMUSCULAR | Status: AC | PRN
  Administered 2018-02-23: .33 mL

## 2018-02-23 MED ORDER — LIDOCAINE HCL 1 % IJ SOLN
0.3300 mL | INTRAMUSCULAR | Status: AC | PRN
  Administered 2018-02-23: .33 mL

## 2018-02-23 MED ORDER — METHYLPREDNISOLONE ACETATE 40 MG/ML IJ SUSP
13.3300 mg | INTRAMUSCULAR | Status: AC | PRN
  Administered 2018-02-23: 13.33 mg
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
## 2018-02-26 ENCOUNTER — Telehealth: Payer: Self-pay

## 2018-02-26 NOTE — Telephone Encounter (Signed)
Health Coaching 3  New Goal:  Patient states that she is currently drinking 24-32oz of water per day.  Patient states that she eats 2 servings of fruit per day and 3 servings of vegetables per day.  Patient states that she eats 2-3 servings of whole grains per day (whole grain bread, brown rice, whole grain noodles with spaghetti).  Patient states that she eats one serving of fish per week, usually baked if she fixes it at home (salmon) and fried if she eats it in a restaurant.  Patient states that occasionally she will eat fried chicken and  fried fish with hush puppies in the restaurant.  Patient states that she doesn't usually drink a lot of sugar drinks.  Patient states that she is not currently doing any regular physical activity.  Patient states that her goal is to start walking 30 minutes three times/week.  Confidence Level for Achieving Goals:  Patient states that on a scale of 1-10 with 1 being the lowest and 10 the highest, her confidence level in achieving her goal of walking 30 minutes three times per week is a 7.  Barrier to reaching goal:  Patient did not state a barrier to reaching her goal.  Strategies to overcome barriers:  N/A  Navigation:  Patient is aware of a follow-up session.  Patient states that we can call anytime.  Time:  25 minutes

## 2018-03-01 ENCOUNTER — Other Ambulatory Visit: Payer: Self-pay | Admitting: Family Medicine

## 2018-03-01 DIAGNOSIS — I10 Essential (primary) hypertension: Secondary | ICD-10-CM

## 2018-03-01 MED FILL — ?CETIRIZINE HCL 10 MG TABLE: 10 | 30 days supply | Qty: 30 | Fill #1

## 2018-03-01 MED FILL — ?HYDRALAZINE 25MG TAB: 25 | 30 days supply | Qty: 90 | Fill #3

## 2018-03-04 ENCOUNTER — Other Ambulatory Visit: Payer: Self-pay

## 2018-03-04 NOTE — Telephone Encounter (Signed)
Patient is requesting refills on this medication, the current orders show an order placed by Lindell Spar on 12/08/17 when the patient was being discharged from the hospital but it looks like it was not sent to the pharmacy as it is listed as 'no print' and does not have a recorded quantity or number of refills. Please refill if appropriate.

## 2018-03-05 MED ORDER — SPIRONOLACTONE 50 MG PO TABS: 50.0000 mg | ORAL_TABLET | Freq: Every day | ORAL | 5 refills | Status: DC

## 2018-03-05 MED FILL — SPIRONOLACTONE 50 MG TABLET: 50 | 30 days supply | Qty: 30 | Fill #0

## 2018-03-09 ENCOUNTER — Telehealth: Payer: Self-pay

## 2018-03-09 NOTE — Telephone Encounter (Signed)
Calling Sabrina [name] to see if she is aware of the [employer], the [employer], and the [employer].

## 2018-03-11 MED FILL — metFORMIN HCL 850 MG TABS: 850 | 30 days supply | Qty: 30 | Fill #5

## 2018-03-15 ENCOUNTER — Ambulatory Visit: Payer: No Typology Code available for payment source | Attending: Internal Medicine | Admitting: Internal Medicine

## 2018-03-15 ENCOUNTER — Encounter: Payer: Self-pay | Admitting: Internal Medicine

## 2018-03-15 VITALS — BP 119/76 | HR 70 | Temp 98.0°F | Resp 16 | Wt 245.0 lb

## 2018-03-15 DIAGNOSIS — E669 Obesity, unspecified: Secondary | ICD-10-CM

## 2018-03-15 DIAGNOSIS — F319 Bipolar disorder, unspecified: Secondary | ICD-10-CM | POA: Insufficient documentation

## 2018-03-15 DIAGNOSIS — E119 Type 2 diabetes mellitus without complications: Secondary | ICD-10-CM | POA: Insufficient documentation

## 2018-03-15 DIAGNOSIS — I10 Essential (primary) hypertension: Secondary | ICD-10-CM | POA: Insufficient documentation

## 2018-03-15 DIAGNOSIS — Z6841 Body Mass Index (BMI) 40.0 and over, adult: Secondary | ICD-10-CM | POA: Insufficient documentation

## 2018-03-15 DIAGNOSIS — Z888 Allergy status to other drugs, medicaments and biological substances status: Secondary | ICD-10-CM | POA: Insufficient documentation

## 2018-03-15 DIAGNOSIS — Z7982 Long term (current) use of aspirin: Secondary | ICD-10-CM | POA: Insufficient documentation

## 2018-03-15 DIAGNOSIS — E1169 Type 2 diabetes mellitus with other specified complication: Secondary | ICD-10-CM

## 2018-03-15 DIAGNOSIS — Z7722 Contact with and (suspected) exposure to environmental tobacco smoke (acute) (chronic): Secondary | ICD-10-CM | POA: Insufficient documentation

## 2018-03-15 DIAGNOSIS — Z9889 Other specified postprocedural states: Secondary | ICD-10-CM | POA: Insufficient documentation

## 2018-03-15 DIAGNOSIS — Z791 Long term (current) use of non-steroidal anti-inflammatories (NSAID): Secondary | ICD-10-CM | POA: Insufficient documentation

## 2018-03-15 DIAGNOSIS — Z79899 Other long term (current) drug therapy: Secondary | ICD-10-CM | POA: Insufficient documentation

## 2018-03-15 DIAGNOSIS — E785 Hyperlipidemia, unspecified: Secondary | ICD-10-CM | POA: Insufficient documentation

## 2018-03-15 DIAGNOSIS — Z880 Allergy status to penicillin: Secondary | ICD-10-CM | POA: Insufficient documentation

## 2018-03-15 DIAGNOSIS — G4733 Obstructive sleep apnea (adult) (pediatric): Secondary | ICD-10-CM | POA: Insufficient documentation

## 2018-03-15 DIAGNOSIS — Z8249 Family history of ischemic heart disease and other diseases of the circulatory system: Secondary | ICD-10-CM | POA: Insufficient documentation

## 2018-03-15 LAB — GLUCOSE, POCT (MANUAL RESULT ENTRY)
POC Glucose: 62 mg/dl — AB (ref 70–99)
POC Glucose: 76 mg/dl (ref 70–99)
POC Glucose: 121 mg/dl — AB (ref 70–99)
POC Glucose: 76 mg/dl (ref 70–99)

## 2018-03-15 LAB — POCT GLYCOSYLATED HEMOGLOBIN (HGB A1C): HbA1c, POC (prediabetic range): 5.7 % (ref 5.7–6.4)

## 2018-03-15 MED ORDER — METFORMIN HCL 500 MG PO TABS: 500.0000 mg | ORAL_TABLET | Freq: Every day | ORAL | 3 refills | Status: DC

## 2018-03-15 MED ORDER — GLUCOSE BLOOD VI STRP: ORAL_STRIP | 12 refills | Status: DC

## 2018-03-15 MED ORDER — TRUE METRIX METER W/DEVICE KIT: PACK | 0 refills | Status: DC

## 2018-03-15 MED ORDER — HYDRALAZINE HCL 25 MG PO TABS: 25.0000 mg | ORAL_TABLET | Freq: Three times a day (TID) | ORAL | 5 refills | Status: DC

## 2018-03-15 MED ORDER — HYDRALAZINE HCL 25 MG PO TABS: 25.0000 mg | ORAL_TABLET | Freq: Two times a day (BID) | ORAL | 5 refills | Status: DC

## 2018-03-15 MED ORDER — TRUEPLUS LANCETS 28G MISC: 6 refills | Status: DC

## 2018-03-15 MED FILL — TRUE METRIX TEST STRIP: 30 days supply | Qty: 100 | Fill #0

## 2018-03-15 MED FILL — !TRUE METRIX BLOOD GLUCOSE: 30 days supply | Qty: 1 | Fill #0

## 2018-03-15 MED FILL — TRUEplus LANCETS 28G MISC: 30 days supply | Qty: 100 | Fill #0
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
## 2018-03-15 NOTE — Patient Instructions (Signed)
Decrease metformin to 500 mg once a day.  Try to get out and walk at least about 3 times a week for 30 minutes. I have sent a prescription to the pharmacy for a glucometer along with the testing supplies.   I have changed hydralazine from 3 times a day to twice a day dosing.

## 2018-03-16 LAB — MICROALBUMIN / CREATININE URINE RATIO
CREATININE, UR: 50.5 mg/dL
Microalb/Creat Ratio: <5.9 mg/g creat (ref 0.0–30.0)

## 2018-03-19 MED FILL — OXYBUTYNIN 5 MG TABLET: 5 | 30 days supply | Qty: 60 | Fill #5

## 2018-03-19 MED FILL — LOSARTAN POTASSIUM 100 MG T: 100 | 30 days supply | Qty: 30 | Fill #5

## 2018-04-01 MED FILL — ?CETIRIZINE HCL 10 MG TABLE: 10 | 30 days supply | Qty: 30 | Fill #2

## 2018-04-01 MED FILL — SPIRONOLACTONE 50 MG TABLET: 50 | 30 days supply | Qty: 30 | Fill #1

## 2018-04-07 ENCOUNTER — Telehealth: Payer: Self-pay

## 2018-04-07 NOTE — Telephone Encounter (Signed)
As per Maria Ramirez Perez, [facility], the patient's referral remains active.  

## 2018-04-13 ENCOUNTER — Other Ambulatory Visit: Payer: Self-pay | Admitting: Internal Medicine

## 2018-04-13 DIAGNOSIS — E1169 Type 2 diabetes mellitus with other specified complication: Secondary | ICD-10-CM

## 2018-04-13 DIAGNOSIS — E669 Obesity, unspecified: Principal | ICD-10-CM

## 2018-04-15 ENCOUNTER — Other Ambulatory Visit: Payer: Self-pay | Admitting: Pharmacist

## 2018-04-15 ENCOUNTER — Ambulatory Visit: Payer: Self-pay

## 2018-04-15 DIAGNOSIS — E1169 Type 2 diabetes mellitus with other specified complication: Secondary | ICD-10-CM

## 2018-04-15 DIAGNOSIS — E669 Obesity, unspecified: Principal | ICD-10-CM

## 2018-04-15 MED ORDER — METFORMIN HCL 500 MG PO TABS: 500.0000 mg | ORAL_TABLET | Freq: Every day | ORAL | 3 refills | Status: DC

## 2018-04-15 MED FILL — ?METFORMIN HCL 500MG TABLET: 500 | 30 days supply | Qty: 30 | Fill #0

## 2018-04-22 MED FILL — ?HYDRALAZINE 25MG TAB: 25 | 30 days supply | Qty: 90 | Fill #4

## 2018-04-26 MED FILL — OXYBUTYNIN 5 MG TABLET: 5 | 30 days supply | Qty: 60 | Fill #6

## 2018-04-27 ENCOUNTER — Telehealth: Payer: Self-pay | Admitting: Internal Medicine

## 2018-04-27 ENCOUNTER — Ambulatory Visit: Payer: Self-pay | Attending: Family Medicine

## 2018-04-27 NOTE — Telephone Encounter (Signed)
I spoke to patient she has the [facility] and  She is working to get her [employer]  . Thank You

## 2018-04-27 NOTE — Telephone Encounter (Signed)
PT would like an urgent referral to dentist. Call pt [phone].

## 2018-05-03 ENCOUNTER — Other Ambulatory Visit: Payer: Self-pay | Admitting: Internal Medicine

## 2018-05-03 DIAGNOSIS — R059 Cough, unspecified: Secondary | ICD-10-CM

## 2018-05-03 DIAGNOSIS — R05 Cough: Secondary | ICD-10-CM

## 2018-05-03 MED FILL — ?CETIRIZINE HCL 10 MG TABLE: 10 | 30 days supply | Qty: 30 | Fill #0

## 2018-05-04 ENCOUNTER — Other Ambulatory Visit: Payer: Self-pay | Admitting: Internal Medicine

## 2018-05-04 DIAGNOSIS — I10 Essential (primary) hypertension: Secondary | ICD-10-CM

## 2018-05-04 MED FILL — ?SPIRONOLACTONE 50 MG TABLE: 50 | 30 days supply | Qty: 30 | Fill #2

## 2018-05-04 MED FILL — LOSARTAN POTASSIUM 100 MG T: 100 | 30 days supply | Qty: 30 | Fill #0

## 2018-05-06 ENCOUNTER — Encounter: Payer: Self-pay | Admitting: Internal Medicine

## 2018-05-06 ENCOUNTER — Ambulatory Visit: Payer: Self-pay | Attending: Internal Medicine | Admitting: Internal Medicine

## 2018-05-06 VITALS — BP 135/83 | HR 72 | Temp 97.9°F | Resp 16 | Wt 245.8 lb

## 2018-05-06 DIAGNOSIS — K029 Dental caries, unspecified: Secondary | ICD-10-CM

## 2018-05-06 DIAGNOSIS — Z9189 Other specified personal risk factors, not elsewhere classified: Secondary | ICD-10-CM

## 2018-05-06 NOTE — Patient Instructions (Signed)
Referral has been submitted to dentistry.
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
## 2018-05-12 ENCOUNTER — Ambulatory Visit: Payer: Self-pay | Admitting: Podiatry

## 2018-05-20 MED FILL — ?METFORMIN HCL 500MG TABL: 500 | 30 days supply | Qty: 30 | Fill #1

## 2018-06-08 ENCOUNTER — Other Ambulatory Visit: Payer: Self-pay | Admitting: Internal Medicine

## 2018-06-08 DIAGNOSIS — N393 Stress incontinence (female) (male): Secondary | ICD-10-CM

## 2018-06-09 MED FILL — OXYBUTYNIN 5 MG TABLET: 5 | 30 days supply | Qty: 60 | Fill #0

## 2018-06-14 MED FILL — LOSARTAN POTASSIUM 100 MG T: 100 | 30 days supply | Qty: 30 | Fill #1

## 2018-06-14 MED FILL — ?CETIRIZINE HCL 10 MG TABLE: 10 | 30 days supply | Qty: 30 | Fill #1

## 2018-06-22 MED FILL — hydrALAZINE HCL 25 MG TABS: 25 | 30 days supply | Qty: 90 | Fill #5

## 2018-06-22 MED FILL — ?SPIRONOLACTONE 50 MG TABLE: 50 | 90 days supply | Qty: 90 | Fill #3

## 2018-06-28 MED FILL — metFORMIN HCL 500 MG TABS: 500 | 30 days supply | Qty: 30 | Fill #2

## 2018-07-03 ENCOUNTER — Emergency Department (HOSPITAL_COMMUNITY)
Admission: EM | Admit: 2018-07-03 | Discharge: 2018-07-03 | Disposition: A | Discharge status: Home / Self Care | Payer: Self-pay | Attending: Emergency Medicine | Admitting: Emergency Medicine

## 2018-07-03 ENCOUNTER — Encounter (HOSPITAL_COMMUNITY): Payer: Self-pay | Admitting: Oncology

## 2018-07-03 ENCOUNTER — Other Ambulatory Visit: Payer: Self-pay

## 2018-07-03 ENCOUNTER — Emergency Department (HOSPITAL_COMMUNITY): Payer: Self-pay

## 2018-07-03 DIAGNOSIS — R05 Cough: Secondary | ICD-10-CM | POA: Insufficient documentation

## 2018-07-03 DIAGNOSIS — I10 Essential (primary) hypertension: Secondary | ICD-10-CM | POA: Insufficient documentation

## 2018-07-03 DIAGNOSIS — Z79899 Other long term (current) drug therapy: Secondary | ICD-10-CM | POA: Insufficient documentation

## 2018-07-03 DIAGNOSIS — Z7722 Contact with and (suspected) exposure to environmental tobacco smoke (acute) (chronic): Secondary | ICD-10-CM | POA: Insufficient documentation

## 2018-07-03 DIAGNOSIS — Z7982 Long term (current) use of aspirin: Secondary | ICD-10-CM | POA: Insufficient documentation

## 2018-07-03 DIAGNOSIS — R0602 Shortness of breath: Secondary | ICD-10-CM | POA: Insufficient documentation

## 2018-07-03 DIAGNOSIS — R5381 Other malaise: Secondary | ICD-10-CM | POA: Insufficient documentation

## 2018-07-03 DIAGNOSIS — R070 Pain in throat: Secondary | ICD-10-CM | POA: Insufficient documentation

## 2018-07-03 DIAGNOSIS — Z91040 Latex allergy status: Secondary | ICD-10-CM | POA: Insufficient documentation

## 2018-07-03 DIAGNOSIS — R079 Chest pain, unspecified: Secondary | ICD-10-CM | POA: Insufficient documentation

## 2018-07-03 DIAGNOSIS — E119 Type 2 diabetes mellitus without complications: Secondary | ICD-10-CM | POA: Insufficient documentation

## 2018-07-03 DIAGNOSIS — Z7984 Long term (current) use of oral hypoglycemic drugs: Secondary | ICD-10-CM | POA: Insufficient documentation

## 2018-07-03 DIAGNOSIS — R5383 Other fatigue: Secondary | ICD-10-CM | POA: Insufficient documentation

## 2018-07-03 LAB — CBC WITH DIFFERENTIAL/PLATELET
Abs Immature Granulocytes: 0.02 10*3/uL (ref 0.00–0.07)
Basophils Absolute: 0 10*3/uL (ref 0.0–0.1)
Basophils Relative: 0 %
Eosinophils Absolute: 0.1 10*3/uL (ref 0.0–0.5)
Eosinophils Relative: 1 %
HCT: 38.9 % (ref 36.0–46.0)
Hemoglobin: 12.5 g/dL (ref 12.0–15.0)
Immature Granulocytes: 0 %
Lymphocytes Relative: 39 %
Lymphs Abs: 4 10*3/uL (ref 0.7–4.0)
MCH: 31.5 pg (ref 26.0–34.0)
MCHC: 32.1 g/dL (ref 30.0–36.0)
MCV: 98 fL (ref 80.0–100.0)
Monocytes Absolute: 1 10*3/uL (ref 0.1–1.0)
Monocytes Relative: 10 %
Neutro Abs: 5.1 10*3/uL (ref 1.7–7.7)
Neutrophils Relative %: 50 %
Platelets: 333 10*3/uL (ref 150–400)
RBC: 3.97 MIL/uL (ref 3.87–5.11)
RDW: 13.6 % (ref 11.5–15.5)
WBC: 10.2 10*3/uL (ref 4.0–10.5)
nRBC: 0 % (ref 0.0–0.2)

## 2018-07-03 LAB — BASIC METABOLIC PANEL
Anion gap: 11 (ref 5–15)
BUN: 17 mg/dL (ref 6–20)
CO2: 22 mmol/L (ref 22–32)
Calcium: 9.5 mg/dL (ref 8.9–10.3)
Chloride: 105 mmol/L (ref 98–111)
Creatinine, Ser: 0.9 mg/dL (ref 0.44–1.00)
GFR calc Af Amer: >60 mL/min (ref >60)
GFR calc non Af Amer: >60 mL/min (ref >60)
Glucose, Bld: 84 mg/dL (ref 70–99)
Potassium: 4.1 mmol/L (ref 3.5–5.1)
Sodium: 138 mmol/L (ref 135–145)

## 2018-07-03 LAB — BRAIN NATRIURETIC PEPTIDE: B Natriuretic Peptide: 22.8 pg/mL (ref 0.0–100.0)

## 2018-07-03 LAB — TROPONIN I: Troponin I: <0.03 ng/mL (ref <0.03)

## 2018-07-03 LAB — D-DIMER, QUANTITATIVE (NOT AT ARMC): D-Dimer, Quant: <0.27 ug/mL-FEU (ref 0.00–0.50)

## 2018-07-03 MED ORDER — ALBUTEROL SULFATE HFA 108 (90 BASE) MCG/ACT IN AERS: 1.0000 | INHALATION_SPRAY | Freq: Four times a day (QID) | RESPIRATORY_TRACT | 0 refills | Status: DC | PRN

## 2018-07-03 NOTE — ED Triage Notes (Signed)
Pt reports shob, chest tightness, fatigue starting yesterday.  Pt is speaking in full sentences.  In NAD.

## 2018-07-03 NOTE — Discharge Instructions (Addendum)
You were seen in the emergency department for increased shortness of breath along with some runny nose sore throat and fatigue.  You had lab work EKG chest x-ray that was unremarkable.  You should go home and continue to stay well-hydrated and get plenty of rest.  Please contact your doctor if you have any worsening symptoms and return to the emergency department if concerned.  You were not tested for Covid during this ED visit consistent with the policy at this hospital.  You may need testing if your symptoms worsen and if the test become more available.
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
## 2018-07-19 MED FILL — OXYBUTYNIN 5 MG TABLET: 5 | 30 days supply | Qty: 60 | Fill #1

## 2018-07-19 MED FILL — ?CETIRIZINE HCL 10 MG TABLE: 10 | 30 days supply | Qty: 30 | Fill #2

## 2018-07-28 MED FILL — ?METFORMIN HCL 500MG TABLET: 500 | 30 days supply | Qty: 30 | Fill #3

## 2018-08-02 MED FILL — LOSARTAN POTASSIUM 100 MG T: 100 | 30 days supply | Qty: 30 | Fill #2

## 2018-08-07 ENCOUNTER — Other Ambulatory Visit: Payer: Self-pay

## 2018-08-07 ENCOUNTER — Encounter (HOSPITAL_COMMUNITY): Payer: Self-pay | Admitting: Emergency Medicine

## 2018-08-07 ENCOUNTER — Emergency Department (HOSPITAL_COMMUNITY): Payer: HRSA Program

## 2018-08-07 ENCOUNTER — Emergency Department (HOSPITAL_COMMUNITY)
Admission: EM | Admit: 2018-08-07 | Discharge: 2018-08-07 | Disposition: A | Discharge status: Home / Self Care | Payer: HRSA Program | Attending: Emergency Medicine | Admitting: Emergency Medicine

## 2018-08-07 DIAGNOSIS — E119 Type 2 diabetes mellitus without complications: Secondary | ICD-10-CM | POA: Insufficient documentation

## 2018-08-07 DIAGNOSIS — Z20828 Contact with and (suspected) exposure to other viral communicable diseases: Secondary | ICD-10-CM | POA: Insufficient documentation

## 2018-08-07 DIAGNOSIS — Z7982 Long term (current) use of aspirin: Secondary | ICD-10-CM | POA: Insufficient documentation

## 2018-08-07 DIAGNOSIS — Z79899 Other long term (current) drug therapy: Secondary | ICD-10-CM | POA: Insufficient documentation

## 2018-08-07 DIAGNOSIS — Z7984 Long term (current) use of oral hypoglycemic drugs: Secondary | ICD-10-CM | POA: Diagnosis not present

## 2018-08-07 DIAGNOSIS — I10 Essential (primary) hypertension: Secondary | ICD-10-CM | POA: Diagnosis not present

## 2018-08-07 DIAGNOSIS — J029 Acute pharyngitis, unspecified: Secondary | ICD-10-CM

## 2018-08-07 DIAGNOSIS — R05 Cough: Secondary | ICD-10-CM

## 2018-08-07 DIAGNOSIS — Z91040 Latex allergy status: Secondary | ICD-10-CM | POA: Diagnosis not present

## 2018-08-07 DIAGNOSIS — Z7722 Contact with and (suspected) exposure to environmental tobacco smoke (acute) (chronic): Secondary | ICD-10-CM | POA: Diagnosis not present

## 2018-08-07 DIAGNOSIS — R059 Cough, unspecified: Secondary | ICD-10-CM

## 2018-08-07 NOTE — ED Triage Notes (Signed)
Pt from from home c/o cough for two weeks, diarrhea yesterday, weakness, and shortness of breath.   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

## 2018-08-07 NOTE — Discharge Instructions (Addendum)
It was my pleasure taking care of you today!   Your x-ray was normal today.   You were tested for Coronavirus today. This will take 24-48 hours to come back. You can log into mychart to see results.   Return to ER for new or worsening symptoms, any additional concerns.
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
## 2018-08-09 LAB — NOVEL CORONAVIRUS, NAA (HOSP ORDER, SEND-OUT TO REF LAB; TAT 18-24 HRS): SARS-CoV-2, NAA: NOT DETECTED

## 2018-08-10 ENCOUNTER — Other Ambulatory Visit: Payer: Self-pay | Admitting: Internal Medicine

## 2018-08-10 DIAGNOSIS — I10 Essential (primary) hypertension: Secondary | ICD-10-CM

## 2018-08-18 ENCOUNTER — Telehealth: Payer: Self-pay

## 2018-08-18 NOTE — Telephone Encounter (Signed)
As per Maria Ramirez Perez, [facility], the patient's referral is now closed.  

## 2018-08-19 ENCOUNTER — Other Ambulatory Visit: Payer: Self-pay | Admitting: Internal Medicine

## 2018-08-19 DIAGNOSIS — I10 Essential (primary) hypertension: Secondary | ICD-10-CM

## 2018-08-19 MED FILL — hydrALAZINE HCL 25 MG TABS: 25 | 30 days supply | Qty: 90 | Fill #0

## 2018-08-24 ENCOUNTER — Other Ambulatory Visit: Payer: Self-pay | Admitting: Internal Medicine

## 2018-08-24 DIAGNOSIS — Z1231 Encounter for screening mammogram for malignant neoplasm of breast: Secondary | ICD-10-CM

## 2018-08-25 ENCOUNTER — Other Ambulatory Visit: Payer: Self-pay | Admitting: Internal Medicine

## 2018-08-25 DIAGNOSIS — R05 Cough: Secondary | ICD-10-CM

## 2018-08-25 DIAGNOSIS — R059 Cough, unspecified: Secondary | ICD-10-CM

## 2018-08-25 MED FILL — ?METFORMIN HCL 500MG TABLET: 500 | 30 days supply | Qty: 30 | Fill #4

## 2018-08-25 MED FILL — OXYBUTYNIN 5 MG TABLET: 5 | 30 days supply | Qty: 60 | Fill #2

## 2018-08-26 MED FILL — ?CETIRIZINE HCL 10 MG TABLE: 10 | 30 days supply | Qty: 30 | Fill #0

## 2018-09-06 ENCOUNTER — Ambulatory Visit: Payer: Self-pay | Attending: Internal Medicine | Admitting: Internal Medicine

## 2018-09-06 ENCOUNTER — Encounter: Payer: Self-pay | Admitting: Internal Medicine

## 2018-09-06 ENCOUNTER — Other Ambulatory Visit: Payer: Self-pay

## 2018-09-06 VITALS — BP 113/76 | HR 62 | Temp 98.3°F | Resp 16 | Wt 260.2 lb

## 2018-09-06 DIAGNOSIS — G4733 Obstructive sleep apnea (adult) (pediatric): Secondary | ICD-10-CM

## 2018-09-06 DIAGNOSIS — E669 Obesity, unspecified: Secondary | ICD-10-CM

## 2018-09-06 DIAGNOSIS — R400 Somnolence: Secondary | ICD-10-CM

## 2018-09-06 DIAGNOSIS — E1169 Type 2 diabetes mellitus with other specified complication: Secondary | ICD-10-CM

## 2018-09-06 DIAGNOSIS — F319 Bipolar disorder, unspecified: Secondary | ICD-10-CM

## 2018-09-06 DIAGNOSIS — K029 Dental caries, unspecified: Secondary | ICD-10-CM

## 2018-09-06 LAB — POCT GLYCOSYLATED HEMOGLOBIN (HGB A1C): HbA1c POC (<> result, manual entry): 5.5 % (ref 4.0–5.6)

## 2018-09-06 NOTE — Progress Notes (Signed)
Pt states her shoulder pain comes and goes but at this moment she is not in any pain
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
## 2018-09-06 NOTE — Patient Instructions (Signed)
I have referred you to the dentist and for a sleep study.   Follow a Healthy Eating Plan - You can do it! Limit sugary drinks.  Avoid sodas, sweet tea, sport or energy drinks, or fruit drinks.  Drink water, lo-fat milk, or diet drinks. Limit snack foods.   Cut back on candy, cake, cookies, chips, ice cream.  These are a special treat, only in small amounts. Eat plenty of vegetables.  Especially dark green, red, and orange vegetables. Aim for at least 3 servings a day. More is better! Include fruit in your daily diet.  Whole fruit is much healthier than fruit juice! Limit "white" bread, "white" pasta, "white" rice.   Choose "100% whole grain" products, brown or wild rice. Avoid fatty meats. Try "Meatless Monday" and choose eggs or beans one day a week.  When eating meat, choose lean meats like chicken, [place], and fish.  Grill, broil, or bake meats instead of frying, and eat poultry without the skin. Eat less salt.  Avoid frozen pizzas, frozen dinners and salty foods.  Use seasonings other than salt in cooking.  This can help blood pressure and keep you from swelling Beer, wine and liquor have calories.  If you can safely drink alcohol, limit to 1 drink per day for women, 2 drinks for men

## 2018-09-20 ENCOUNTER — Other Ambulatory Visit (HOSPITAL_COMMUNITY)
Admission: RE | Admit: 2018-09-20 | Discharge: 2018-09-20 | Disposition: A | Discharge status: Home / Self Care | Payer: HRSA Program | Source: Ambulatory Visit | Attending: Internal Medicine | Admitting: Internal Medicine

## 2018-09-20 DIAGNOSIS — Z1159 Encounter for screening for other viral diseases: Secondary | ICD-10-CM | POA: Diagnosis present

## 2018-09-20 LAB — SARS CORONAVIRUS 2 (TAT 6-24 HRS): SARS Coronavirus 2: NEGATIVE

## 2018-09-23 ENCOUNTER — Other Ambulatory Visit: Payer: Self-pay

## 2018-09-23 ENCOUNTER — Ambulatory Visit (HOSPITAL_BASED_OUTPATIENT_CLINIC_OR_DEPARTMENT_OTHER): Payer: Self-pay | Attending: Internal Medicine | Admitting: Internal Medicine

## 2018-09-23 DIAGNOSIS — R0683 Snoring: Secondary | ICD-10-CM | POA: Insufficient documentation

## 2018-09-23 DIAGNOSIS — R400 Somnolence: Secondary | ICD-10-CM | POA: Insufficient documentation

## 2018-09-23 DIAGNOSIS — G4733 Obstructive sleep apnea (adult) (pediatric): Secondary | ICD-10-CM | POA: Insufficient documentation

## 2018-09-24 ENCOUNTER — Other Ambulatory Visit (HOSPITAL_BASED_OUTPATIENT_CLINIC_OR_DEPARTMENT_OTHER): Payer: Self-pay

## 2018-09-24 DIAGNOSIS — R400 Somnolence: Secondary | ICD-10-CM

## 2018-09-24 DIAGNOSIS — G4733 Obstructive sleep apnea (adult) (pediatric): Secondary | ICD-10-CM

## 2018-09-26 DIAGNOSIS — R400 Somnolence: Secondary | ICD-10-CM

## 2018-09-26 DIAGNOSIS — G4733 Obstructive sleep apnea (adult) (pediatric): Secondary | ICD-10-CM

## 2018-09-26 NOTE — Procedures (Signed)
   Patient Name: Sabrina [name], Sabrina [name] Date: 09/23/2018 Gender: Female D.O.B: [birth year] Age (years): 53 Referring Provider: Karle Plumber MD Height (inches): 64 Interpreting Physician: Baird Lyons MD, ABSM Weight (lbs): 260 RPSGT: Jorge Ny BMI: 45 MRN: [id number] Neck Size: 14.00  CLINICAL INFORMATION Sleep Study Type: NPSG Indication for sleep study: Daytime Fatigue, Diabetes, Hypertension, Morbid Obesity, Witnesses Apnea / Gasping During Sleep Epworth Sleepiness Score: 7  SLEEP STUDY TECHNIQUE As per the AASM Manual for the Scoring of Sleep and Associated Events v2.3 (April 2016) with a hypopnea requiring 4% desaturations.  The channels recorded and monitored were frontal, central and occipital EEG, electrooculogram (EOG), submentalis EMG (chin), nasal and oral airflow, thoracic and abdominal wall motion, anterior tibialis EMG, snore microphone, electrocardiogram, and pulse oximetry.  MEDICATIONS Medications self-administered by patient taken the night of the study : DIVALPROEX, HYDRALAZINE, LOSARTAN, OXYBUTYNIN  SLEEP ARCHITECTURE The study was initiated at 10:52:01 PM and ended at 5:19:31 AM.  Sleep onset time was 43.8 minutes and the sleep efficiency was 76.8%%. The total sleep time was 297.7 minutes.  Stage REM latency was 86.5 minutes.  The patient spent 3.9%% of the night in stage N1 sleep, 79.5%% in stage N2 sleep, 0.0%% in stage N3 and 16.6% in REM.  Alpha intrusion was absent.  Supine sleep was 38.96%.  RESPIRATORY PARAMETERS The overall apnea/hypopnea index (AHI) was 1.2 per hour. There were 2 total apneas, including 0 obstructive, 2 central and 0 mixed apneas. There were 4 hypopneas and 5 RERAs.  The AHI during Stage REM sleep was 1.2 per hour.  AHI while supine was 2.1 per hour.  The mean oxygen saturation was 96.0%. The minimum SpO2 during sleep was 90.0%.  moderate snoring was noted during this study.  CARDIAC DATA The 2 lead EKG  demonstrated sinus rhythm. The mean heart rate was 75.4 beats per minute. Other EKG findings include: None.  LEG MOVEMENT DATA The total PLMS were 0 with a resulting PLMS index of 0.0. Associated arousal with leg movement index was 3.6 .  IMPRESSIONS - No significant obstructive sleep apnea occurred during this study (AHI = 1.2/h). - No significant central sleep apnea occurred during this study (CAI = 0.4/h). - The patient had minimal or no oxygen desaturation during the study (Min O2 = 90.0%) - The patient snored with moderate snoring volume. - No cardiac abnormalities were noted during this study. - Clinically significant periodic limb movements did not occur during sleep. No significant associated arousals  DIAGNOSIS - Primary snoring  RECOMMENDATIONS - Manage for symptoms and snoring as clinically indicated.  - Be careful with alcohol, sedatives and other CNS depressants that may worsen sleep apnea and disrupt normal sleep architecture. - Sleep hygiene should be reviewed to assess factors that may improve sleep quality. - Weight management and regular exercise should be initiated or continued if appropriate.  [Electronically signed] 09/26/2018 11:56 AM  Baird Lyons MD, ABSM Diplomate, American Board of Sleep Medicine   NPI: [id number]                          [place]  ELECTRONICALLY SIGNED ON:  09/26/2018, 11:54 AM [place] PH: [phone]   FX: [phone] [place]

## 2018-09-28 ENCOUNTER — Other Ambulatory Visit: Payer: Self-pay | Admitting: Internal Medicine

## 2018-09-28 ENCOUNTER — Telehealth: Payer: Self-pay | Admitting: *Deleted

## 2018-09-28 MED FILL — ?CETIRIZINE HCL 10 MG TABLE: 10 | 30 days supply | Qty: 30 | Fill #1

## 2018-09-28 MED FILL — ?SPIRONOLACTON 50MG TABL: 50 | 30 days supply | Qty: 30 | Fill #0

## 2018-09-28 NOTE — Telephone Encounter (Signed)
-----   Message from Ladell Pier, MD sent at 09/27/2018  7:58 AM EDT ----- Let patient know that the sleep study revealed that she snores a lot but no significant sleep apnea.  Good sleep hygiene is encouraged.  This includes trying to get at least 7 to 8 hours of sleep at night, trying to get in bed around about the same time every night, turning off all lights and sounds once in bed, avoid alcohol use close to bedtime.  Weight loss is encouraged.

## 2018-09-28 NOTE — Telephone Encounter (Signed)
Patient verified DOB Patient is aware of OSA not being present but snoring was. Patient encouraged to lose weight, get 7 to 8 hours of sleep daily. Patient states she is implementing walking daily.

## 2018-10-08 MED FILL — OXYBUTYNIN 5 MG TABLET: 5 | 30 days supply | Qty: 60 | Fill #3

## 2018-10-08 MED FILL — ?METFORMIN HCL 500MG TABLET: 500 | 30 days supply | Qty: 30 | Fill #5

## 2018-10-22 MED FILL — hydrALAZINE HCL 25 MG TABS: 25 | 30 days supply | Qty: 90 | Fill #1

## 2018-10-26 ENCOUNTER — Ambulatory Visit
Admission: RE | Admit: 2018-10-26 | Discharge: 2018-10-26 | Disposition: A | Discharge status: Home / Self Care | Payer: No Typology Code available for payment source | Source: Ambulatory Visit | Attending: Internal Medicine | Admitting: Internal Medicine

## 2018-10-26 ENCOUNTER — Other Ambulatory Visit: Payer: Self-pay

## 2018-10-26 DIAGNOSIS — Z1231 Encounter for screening mammogram for malignant neoplasm of breast: Secondary | ICD-10-CM

## 2018-10-27 ENCOUNTER — Other Ambulatory Visit: Payer: Self-pay | Admitting: Internal Medicine

## 2018-10-27 DIAGNOSIS — R928 Other abnormal and inconclusive findings on diagnostic imaging of breast: Secondary | ICD-10-CM

## 2018-10-29 MED FILL — ?CETIRIZINE HCL 10 MG TABLE: 10 | 30 days supply | Qty: 30 | Fill #2

## 2018-10-29 MED FILL — ?SPIRONOLACTON 50MG TABL: 50 | 30 days supply | Qty: 30 | Fill #1

## 2018-10-29 MED FILL — LOSARTAN POTASSIUM 100 MG T: 100 | 30 days supply | Qty: 30 | Fill #0

## 2018-11-03 ENCOUNTER — Other Ambulatory Visit (HOSPITAL_COMMUNITY): Payer: Self-pay | Admitting: *Deleted

## 2018-11-03 DIAGNOSIS — R928 Other abnormal and inconclusive findings on diagnostic imaging of breast: Secondary | ICD-10-CM

## 2018-11-08 MED FILL — OXYBUTYNIN 5 MG TABLET: 5 | 30 days supply | Qty: 60 | Fill #4

## 2018-11-08 MED FILL — ?METFORMIN HCL 500MG TABLET: 500 | 30 days supply | Qty: 30 | Fill #6

## 2018-11-22 ENCOUNTER — Other Ambulatory Visit: Payer: Self-pay

## 2018-11-22 ENCOUNTER — Ambulatory Visit (HOSPITAL_COMMUNITY)
Admission: EM | Admit: 2018-11-22 | Discharge: 2018-11-22 | Disposition: A | Discharge status: Home / Self Care | Payer: No Typology Code available for payment source | Attending: Emergency Medicine | Admitting: Emergency Medicine

## 2018-11-22 ENCOUNTER — Encounter (HOSPITAL_COMMUNITY): Payer: Self-pay

## 2018-11-22 DIAGNOSIS — S8992XA Unspecified injury of left lower leg, initial encounter: Secondary | ICD-10-CM

## 2018-11-22 DIAGNOSIS — W109XXA Fall (on) (from) unspecified stairs and steps, initial encounter: Secondary | ICD-10-CM

## 2018-11-22 DIAGNOSIS — M62838 Other muscle spasm: Secondary | ICD-10-CM

## 2018-11-22 DIAGNOSIS — W228XXA Striking against or struck by other objects, initial encounter: Secondary | ICD-10-CM

## 2018-11-22 DIAGNOSIS — W19XXXA Unspecified fall, initial encounter: Secondary | ICD-10-CM

## 2018-11-22 DIAGNOSIS — S20222A Contusion of left back wall of thorax, initial encounter: Secondary | ICD-10-CM

## 2018-11-22 MED ORDER — TIZANIDINE HCL 4 MG PO TABS: 4.0000 mg | ORAL_TABLET | Freq: Three times a day (TID) | ORAL | 0 refills | Status: DC | PRN

## 2018-11-22 MED ORDER — IBUPROFEN 600 MG PO TABS: 600.0000 mg | ORAL_TABLET | Freq: Four times a day (QID) | ORAL | 0 refills | Status: DC | PRN

## 2018-11-22 NOTE — Discharge Instructions (Addendum)
Ice the areas of soreness for 20 minutes at a time.  You may take 600 mg ibuprofen combined with 1 g of Tylenol 3-4 times a day as needed for pain.  Take it easy for the next several days.  Gentle stretching may be helpful as well.  You may be more sore tomorrow, but you should not be getting worse over the course of the next few days.  If you are getting significantly worse, return here or go to the ER for evaluation.
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
## 2018-11-22 NOTE — ED Triage Notes (Signed)
Pt states she fell down yesterday and she injured her left shoulder and leg pain.

## 2018-11-23 ENCOUNTER — Ambulatory Visit: Payer: No Typology Code available for payment source

## 2018-11-23 ENCOUNTER — Ambulatory Visit
Admission: RE | Admit: 2018-11-23 | Discharge: 2018-11-23 | Disposition: A | Discharge status: Home / Self Care | Payer: No Typology Code available for payment source | Source: Ambulatory Visit | Attending: Obstetrics and Gynecology | Admitting: Obstetrics and Gynecology

## 2018-11-23 ENCOUNTER — Ambulatory Visit (HOSPITAL_COMMUNITY): Payer: No Typology Code available for payment source

## 2018-11-23 DIAGNOSIS — R928 Other abnormal and inconclusive findings on diagnostic imaging of breast: Secondary | ICD-10-CM

## 2018-11-23 MED FILL — tiZANidine HCL 4 MG TABS: 4 | 10 days supply | Qty: 30 | Fill #0

## 2018-11-23 MED FILL — ?IBUPROFEN 600 MG TABS: 600 | 5 days supply | Qty: 20 | Fill #0

## 2018-12-03 ENCOUNTER — Other Ambulatory Visit: Payer: Self-pay | Admitting: Internal Medicine

## 2018-12-03 DIAGNOSIS — R059 Cough, unspecified: Secondary | ICD-10-CM

## 2018-12-03 DIAGNOSIS — R05 Cough: Secondary | ICD-10-CM

## 2018-12-03 MED FILL — ?SPIRONOLACTON 50MG TABL: 50 | 30 days supply | Qty: 30 | Fill #2

## 2018-12-03 MED FILL — ?CETIRIZINE HCL 10 MG TABLE: 10 | 30 days supply | Qty: 30 | Fill #0

## 2018-12-10 MED FILL — tiZANidine HCL 4 MG TABS: 4 | 10 days supply | Qty: 30 | Fill #0

## 2018-12-13 ENCOUNTER — Other Ambulatory Visit: Payer: Self-pay

## 2018-12-13 DIAGNOSIS — Z20822 Contact with and (suspected) exposure to covid-19: Secondary | ICD-10-CM

## 2018-12-13 MED FILL — ?METFORMIN HCL 500MG TABLET: 500 | 30 days supply | Qty: 30 | Fill #7

## 2018-12-14 LAB — NOVEL CORONAVIRUS, NAA: SARS-CoV-2, NAA: NOT DETECTED

## 2018-12-16 ENCOUNTER — Other Ambulatory Visit: Payer: No Typology Code available for payment source

## 2018-12-16 ENCOUNTER — Encounter (HOSPITAL_COMMUNITY): Payer: Self-pay

## 2018-12-16 ENCOUNTER — Ambulatory Visit (HOSPITAL_COMMUNITY): Payer: No Typology Code available for payment source

## 2018-12-21 MED FILL — OXYBUTYNIN 5 MG TABLET: 5 | 30 days supply | Qty: 60 | Fill #5

## 2018-12-21 MED FILL — hydrALAZINE HCL 25 MG TABS: 25 | 30 days supply | Qty: 90 | Fill #2

## 2018-12-21 MED FILL — LOSARTAN POTASSIUM 100 MG T: 100 | 30 days supply | Qty: 30 | Fill #1

## 2018-12-28 ENCOUNTER — Ambulatory Visit: Payer: Self-pay | Attending: Family Medicine | Admitting: Family Medicine

## 2018-12-28 ENCOUNTER — Encounter: Payer: Self-pay | Admitting: Family Medicine

## 2018-12-28 DIAGNOSIS — F3163 Bipolar disorder, current episode mixed, severe, without psychotic features: Secondary | ICD-10-CM

## 2018-12-28 DIAGNOSIS — R600 Localized edema: Secondary | ICD-10-CM

## 2018-12-28 DIAGNOSIS — E669 Obesity, unspecified: Secondary | ICD-10-CM

## 2018-12-28 DIAGNOSIS — E1169 Type 2 diabetes mellitus with other specified complication: Secondary | ICD-10-CM

## 2018-12-28 DIAGNOSIS — I10 Essential (primary) hypertension: Secondary | ICD-10-CM

## 2018-12-28 MED ORDER — LOSARTAN POTASSIUM 100 MG PO TABS: ORAL_TABLET | ORAL | 2 refills | Status: DC

## 2018-12-28 MED ORDER — HYDRALAZINE HCL 25 MG PO TABS: ORAL_TABLET | ORAL | 2 refills | Status: DC

## 2018-12-28 MED ORDER — SPIRONOLACTONE 50 MG PO TABS: 50.0000 mg | ORAL_TABLET | Freq: Every day | ORAL | 2 refills | Status: DC

## 2018-12-28 MED ORDER — METFORMIN HCL 500 MG PO TABS: 500.0000 mg | ORAL_TABLET | Freq: Every day | ORAL | 3 refills | Status: DC

## 2018-12-28 NOTE — Progress Notes (Signed)
Virtual Visit via Telephone Note  I connected with Sabrina [name], on 12/28/2018 at 1:47 PM by telephone due to the COVID-19 pandemic and verified that I am speaking with the correct person using two identifiers.   Consent: I discussed the limitations, risks, security and privacy concerns of performing an evaluation and management service by telephone and the availability of in person appointments. I also discussed with the patient that there may be a patient responsible charge related to this service. The patient expressed understanding and agreed to proceed.   Location of Patient: Home  Location of Provider: Clinic   Persons participating in Telemedicine visit: Elgie Landino Farrington-CMA Dr. Felecia Shelling     History of Present Illness: 58 year old female patient of Dr. Wynetta Emery with a history of bipolar disorder, type 2 diabetes mellitus (A1c 5.5) who presents today for follow-up visit. She has not been checking her sugar neither has she been checking her blood pressure. She endorses compliance with metformin and denies adverse effects from it.  Tolerating her antihypertensive.  She tries to adhere to a diabetic diet and low-sodium diet but this afternoon had a hotdog which was really salty. She is due for labs but is concerned about receiving a bill for this and is also wondering if she will be receiving a bill for the flu shot. Her bipolar disorder is managed by [facility] and she denies suicidal ideations, suicidal intents, manic episodes. She does have intermittent ankle edema which is absent at this time. Denies chest pains, dyspnea, orthopnea, paroxysmal nocturnal dyspnea.   Past Medical History:  Diagnosis Date  . Anxiety   . Arthritis    "legs, knees, hands" (11/16/2014)  . Bipolar disorder ([place])    2 breakdowns - 1998, 2000 had to be hospitalized, followed at [facility]  . Chest pain    a. Myoview 6/16:  anterior and apical ischemia, EF 55-65%;  b. LHC 8/16:   no CAD, Normal EF  . Chest pain 10/2015  . Chronic bronchitis ([place])    "get it q yr"  . Chronic lower back pain   . Depression   . GERD (gastroesophageal reflux disease)   . Gout   . History of echocardiogram    a. Echo 12/15:  Mild LVH, EF 55-60%, mild LAE, PASP 36 mmHg  . Hyperlipidemia LDL goal < 100    "not on RX" (11/15/2014)  . Hypertension   . Migraine    "monthly" (11/16/2014)  . Mixed restrictive and obstructive lung disease ([place])    Health serve chart suggests PFTs done 1/10  . Morbid obesity with BMI of 40.0-44.9, adult ([place])   . Rheumatoid arthritis [facility])    Health serve records indicate Rheumatoid  . Seizures ([place])    "might have had 1; I'm on depakote" (11/16/2014)  . Type II diabetes mellitus ([facility])    Allergies  Allergen Reactions  . Benzonatate Other (See Comments)    Made her cough worse  . Latex Dermatitis and Other (See Comments)    Hands became scaly  . Penicillins Other (See Comments)    Did it involve swelling of the face/tongue/throat, SOB, or low BP? Unk Did it involve sudden or severe rash/hives, skin peeling, or any reaction on the inside of your mouth or nose? Unk Did you need to seek medical attention at a hospital or doctor's office? Unk When did it last happen? "I was little; I don't remember, but my parents told me to never take it." If all above answers are "  NO", may proceed with cephalosporin use.    Marland Kitchen Lisinopril Cough  . Oxycodone-Acetaminophen Nausea And Vomiting    Pt thinks she may be able to take with food (??)    Current Outpatient Medications on File Prior to Visit  Medication Sig Dispense Refill  . albuterol (PROVENTIL HFA;VENTOLIN HFA) 108 (90 Base) MCG/ACT inhaler Inhale 1-2 puffs into the lungs every 6 (six) hours as needed for wheezing or shortness of breath. 1 Inhaler 0  . aspirin EC 81 MG tablet Take 1 tablet (81 mg total) by mouth daily. For heart health    . Blood Glucose Monitoring Suppl (TRUE METRIX METER) w/Device KIT  Use as directed 1 kit 0  . cetirizine (ZYRTEC) 10 MG tablet TAKE 1 TABLET (10 MG TOTAL) BY MOUTH DAILY AS NEEDED. 30 tablet 2  . divalproex (DEPAKOTE ER) 250 MG 24 hr tablet Take 3 tablets (750 mg total) by mouth at bedtime. For mood stabilization 90 tablet 0  . glucose blood (TRUE METRIX BLOOD GLUCOSE TEST) test strip Use as instructed 100 each 12  . hydrALAZINE (APRESOLINE) 25 MG tablet TAKE 1 TABLET (25 MG TOTAL) BY MOUTH 3 (THREE) TIMES DAILY. 90 tablet 2  . ibuprofen (ADVIL) 600 MG tablet Take 1 tablet (600 mg total) by mouth every 6 (six) hours as needed. 20 tablet 0  . losartan (COZAAR) 100 MG tablet TAKE 1 TABLET (100 MG TOTAL) BY MOUTH EVERY EVENING. 30 tablet 2  . metFORMIN (GLUCOPHAGE) 500 MG tablet Take 1 tablet (500 mg total) by mouth daily with breakfast. For diabetes manangement 60 tablet 3  . oxybutynin (DITROPAN) 5 MG tablet TAKE 1 TABLET BY MOUTH 2 TIMES DAILY. (Patient taking differently: Take 5 mg by mouth 2 (two) times daily. ) 60 tablet 6  . spironolactone (ALDACTONE) 50 MG tablet TAKE 1 TABLET (50 MG TOTAL) BY MOUTH DAILY. 30 tablet 2  . tiZANidine (ZANAFLEX) 4 MG tablet Take 1 tablet (4 mg total) by mouth every 8 (eight) hours as needed for muscle spasms. 30 tablet 0  . traZODone (DESYREL) 50 MG tablet Take 1 tablet (50 mg total) by mouth at bedtime as needed for sleep. 30 tablet 0  . TRUEPLUS LANCETS 28G MISC Use as directed 100 each 6   No current facility-administered medications on file prior to visit.     Observations/Objective: Awake, alert, oriented x3 Not in acute distress  Lab Results  Component Value Date   HGBA1C 5.5 09/06/2018    Assessment and Plan: 1. Diabetes mellitus type 2 in obese ([place]) Controlled with A1c of 5.5 Continue with current management She needs an ophthalmology exam but has no medical coverage; she is willing to pay to see a private ophthalmologist and I have referred her. Counseled on Diabetic diet, my plate method, 384 minutes of  moderate intensity exercise/week Keep blood sugar logs with fasting goals of 80-120 mg/dl, random of less than 180 and in the event of sugars less than 60 mg/dl or greater than 400 mg/dl please notify the clinic ASAP. It is recommended that you undergo annual eye exams and annual foot exams. Pneumonia vaccine is recommended. - Ambulatory referral to Ophthalmology - metFORMIN (GLUCOPHAGE) 500 MG tablet; Take 1 tablet (500 mg total) by mouth daily with breakfast. For diabetes manangement  Dispense: 60 tablet; Refill: 3 - CMP14+EGFR; Future - Lipid panel; Future - Microalbumin/Creatinine Ratio, Urine; Future  2. Essential hypertension Stable She has been advised to check her blood pressures at home Counseled on blood pressure goal  of less than 130/80, low-sodium, DASH diet, medication compliance, 150 minutes of moderate intensity exercise per week. Discussed medication compliance, adverse effects. - hydrALAZINE (APRESOLINE) 25 MG tablet; TAKE 1 TABLET (25 MG TOTAL) BY MOUTH 3 (THREE) TIMES DAILY.  Dispense: 90 tablet; Refill: 2 - losartan (COZAAR) 100 MG tablet; TAKE 1 TABLET (100 MG TOTAL) BY MOUTH EVERY EVENING.  Dispense: 30 tablet; Refill: 2  3. Pedal edema Advised to elevate feet, adhere to low-sodium diet This could be dependent edema  4. Bipolar 1 disorder, mixed, severe ([place]) Currently followed by mental health - Valproic acid level, free; Future   Follow Up Instructions: Return in about 3 months (around 03/29/2019) for Medical conditions with PCP.    I discussed the assessment and treatment plan with the patient. The patient was provided an opportunity to ask questions and all were answered. The patient agreed with the plan and demonstrated an understanding of the instructions.   The patient was advised to call back or seek an in-person evaluation if the symptoms worsen or if the condition fails to improve as anticipated.     I provided 25 minutes total of non-face-to-face  time during this encounter including median intraservice time, reviewing previous notes, labs, imaging, medications, management and patient verbalized understanding.     Charlott Rakes, MD, FAAFP. [facility] [place], [place]   12/28/2018, 1:47 PM

## 2018-12-29 ENCOUNTER — Ambulatory Visit: Payer: No Typology Code available for payment source | Attending: Internal Medicine

## 2018-12-29 ENCOUNTER — Ambulatory Visit: Payer: No Typology Code available for payment source | Admitting: Pharmacist

## 2018-12-29 ENCOUNTER — Other Ambulatory Visit: Payer: Self-pay

## 2018-12-29 DIAGNOSIS — Z23 Encounter for immunization: Secondary | ICD-10-CM

## 2018-12-29 DIAGNOSIS — F3163 Bipolar disorder, current episode mixed, severe, without psychotic features: Secondary | ICD-10-CM

## 2018-12-29 DIAGNOSIS — E1169 Type 2 diabetes mellitus with other specified complication: Secondary | ICD-10-CM

## 2018-12-29 NOTE — Progress Notes (Signed)
Patient presents for vaccination against influenza per orders of Dr. Johnson. Consent given. Counseling provided. No contraindications exists. Vaccine administered without incident.   

## 2018-12-31 ENCOUNTER — Other Ambulatory Visit: Payer: Self-pay | Admitting: Family Medicine

## 2018-12-31 LAB — CMP14+EGFR
ALT: 20 IU/L (ref 0–32)
AST: 15 IU/L (ref 0–40)
Albumin/Globulin Ratio: 1.4 (ref 1.2–2.2)
Albumin: 4.4 g/dL (ref 3.8–4.9)
Alkaline Phosphatase: 69 IU/L (ref 39–117)
BUN/Creatinine Ratio: 13 (ref 9–23)
BUN: 12 mg/dL (ref 6–24)
Bilirubin Total: 0.2 mg/dL (ref 0.0–1.2)
CO2: 25 mmol/L (ref 20–29)
Calcium: 10 mg/dL (ref 8.7–10.2)
Chloride: 101 mmol/L (ref 96–106)
Creatinine, Ser: 0.93 mg/dL (ref 0.57–1.00)
GFR calc Af Amer: 79 mL/min/{1.73_m2} (ref >59)
GFR calc non Af Amer: 68 mL/min/{1.73_m2} (ref >59)
Globulin, Total: 3.1 g/dL (ref 1.5–4.5)
Glucose: 101 mg/dL — ABNORMAL HIGH (ref 65–99)
Potassium: 4.7 mmol/L (ref 3.5–5.2)
Sodium: 140 mmol/L (ref 134–144)
Total Protein: 7.5 g/dL (ref 6.0–8.5)

## 2018-12-31 LAB — LIPID PANEL
Chol/HDL Ratio: 2.5 ratio (ref 0.0–4.4)
Cholesterol, Total: 227 mg/dL — ABNORMAL HIGH (ref 100–199)
HDL: 90 mg/dL (ref >39)
LDL Chol Calc (NIH): 117 mg/dL — ABNORMAL HIGH (ref 0–99)
Triglycerides: 116 mg/dL (ref 0–149)
VLDL Cholesterol Cal: 20 mg/dL (ref 5–40)

## 2018-12-31 LAB — MICROALBUMIN / CREATININE URINE RATIO
Creatinine, Urine: 45.8 mg/dL
Microalb/Creat Ratio: <7 mg/g creat (ref 0–29)
Microalbumin, Urine: <3 ug/mL

## 2018-12-31 LAB — VALPROIC ACID LEVEL, FREE: Valproic Acid, Free: 2.4 ug/mL — ABNORMAL LOW (ref 6.0–22.0)

## 2018-12-31 MED ORDER — ATORVASTATIN CALCIUM 20 MG PO TABS: 20.0000 mg | ORAL_TABLET | Freq: Every day | ORAL | 3 refills | Status: DC

## 2019-01-04 ENCOUNTER — Ambulatory Visit (HOSPITAL_COMMUNITY)
Admission: RE | Admit: 2019-01-04 | Discharge: 2019-01-04 | Disposition: A | Discharge status: Home / Self Care | Payer: No Typology Code available for payment source | Source: Ambulatory Visit | Attending: Obstetrics and Gynecology | Admitting: Obstetrics and Gynecology

## 2019-01-04 ENCOUNTER — Encounter (HOSPITAL_COMMUNITY): Payer: Self-pay

## 2019-01-04 ENCOUNTER — Other Ambulatory Visit: Payer: Self-pay

## 2019-01-04 DIAGNOSIS — N6311 Unspecified lump in the right breast, upper outer quadrant: Secondary | ICD-10-CM | POA: Insufficient documentation

## 2019-01-04 DIAGNOSIS — N644 Mastodynia: Secondary | ICD-10-CM | POA: Insufficient documentation

## 2019-01-04 DIAGNOSIS — Z1239 Encounter for other screening for malignant neoplasm of breast: Secondary | ICD-10-CM | POA: Insufficient documentation

## 2019-01-04 NOTE — Progress Notes (Signed)
Patient referred to [facility] by the [facility] due to recommending additional imaging of the right breast. Screening mammogram completed 10/26/2018.  Pap Smear: Pap smear not completed today. Last Pap smear was 09/10/2017 at [facility] and normal with negative HPV. Per patient has no history of an abnormal Pap smear. Last Pap smear result is in Epic.  Physical exam: Breasts Breasts symmetrical. No skin abnormalities bilateral breasts. No nipple retraction bilateral breasts. No nipple discharge bilateral breasts. No lymphadenopathy. No lumps palpated left breast. Palpated a pea sized lump within the right breast at 10 o'clock next to the nipple. Complaints of tenderness when palpated right breast lump. Referred patient to the [facility] for a right breast diagnostic mammogram and possible ultrasound per recommendation. Appointment scheduled for Wednesday, January 05, 2019 at 1310.        Pelvic/Bimanual No Pap smear completed today since last Pap smear and HPV typing was 09/10/2017. Pap smear not indicated per BCCCP guidelines.   Smoking History: Patient has never smoked.  Patient Navigation: Patient education provided. Access to services provided for patient through [place] program.   Colorectal Cancer Screening: Patient had a colonoscopy completed 08/08/2015. No complaints today.   Breast and Cervical Cancer Risk Assessment Patient has a family history of two maternal aunts having breast cancer. Patient has no known genetic mutations or history of radiation treatment to the chest before age 38. Patient has no history of cervical dysplasia, immunocompromised, or DES exposure in-utero.  Risk Assessment    Risk Scores      01/04/2019 09/10/2017   Last edited by: Loletta Parish, RN Armond Hang, LPN   5-year risk: 1.4 % 1.4 %   Lifetime risk: 7.5 % 7.8 %

## 2019-01-04 NOTE — Patient Instructions (Signed)
Explained breast self awareness with Sabrina [name]. Patient did not need a Pap smear today due to last Pap smear and HPV typing was 09/10/2017. Let her know BCCCP will cover Pap smears and HPV typing every 5 years unless has a history of abnormal Pap smears. Referred patient to the [facility] for a right breast diagnostic mammogram and possible ultrasound per recommendation. Appointment scheduled for Wednesday, January 05, 2019 at 1310. Patient aware of appointment and will be there. Sabrina [name] verbalized understanding.  Sherena Machorro, Arvil [place], RN 2:49 PM

## 2019-01-05 ENCOUNTER — Ambulatory Visit
Admission: RE | Admit: 2019-01-05 | Discharge: 2019-01-05 | Disposition: A | Discharge status: Home / Self Care | Payer: No Typology Code available for payment source | Source: Ambulatory Visit | Attending: Obstetrics and Gynecology | Admitting: Obstetrics and Gynecology

## 2019-01-05 ENCOUNTER — Other Ambulatory Visit (HOSPITAL_COMMUNITY): Payer: Self-pay | Admitting: Obstetrics and Gynecology

## 2019-01-05 ENCOUNTER — Other Ambulatory Visit: Payer: No Typology Code available for payment source

## 2019-01-05 ENCOUNTER — Other Ambulatory Visit: Payer: Self-pay

## 2019-01-05 DIAGNOSIS — N6310 Unspecified lump in the right breast, unspecified quadrant: Secondary | ICD-10-CM

## 2019-01-05 DIAGNOSIS — R928 Other abnormal and inconclusive findings on diagnostic imaging of breast: Secondary | ICD-10-CM

## 2019-01-06 ENCOUNTER — Ambulatory Visit
Admission: RE | Admit: 2019-01-06 | Discharge: 2019-01-06 | Disposition: A | Discharge status: Home / Self Care | Payer: No Typology Code available for payment source | Source: Ambulatory Visit | Attending: Obstetrics and Gynecology | Admitting: Obstetrics and Gynecology

## 2019-01-06 ENCOUNTER — Other Ambulatory Visit: Payer: Self-pay

## 2019-01-06 DIAGNOSIS — N6310 Unspecified lump in the right breast, unspecified quadrant: Secondary | ICD-10-CM

## 2019-01-06 HISTORY — PX: BREAST BIOPSY: SHX20

## 2019-01-10 ENCOUNTER — Encounter: Payer: Self-pay | Admitting: Internal Medicine

## 2019-01-10 MED FILL — ?CETIRIZINE HCL 10 MG TABLE: 10 | 30 days supply | Qty: 30 | Fill #1

## 2019-01-10 MED FILL — ?SPIRONOLACTON 50MG TABL: 50 | 30 days supply | Qty: 30 | Fill #1

## 2019-01-11 LAB — HM DIABETES EYE EXAM

## 2019-01-12 ENCOUNTER — Encounter: Payer: Self-pay | Admitting: Internal Medicine

## 2019-01-12 MED FILL — ?METFORMIN HCL 500MG TABLET: 500 | 30 days supply | Qty: 30 | Fill #0

## 2019-01-21 ENCOUNTER — Ambulatory Visit: Payer: Self-pay | Admitting: Surgery

## 2019-01-21 DIAGNOSIS — N6310 Unspecified lump in the right breast, unspecified quadrant: Secondary | ICD-10-CM

## 2019-01-21 MED FILL — ?METFORMIN HCL 500MG TABLET: 500 | 30 days supply | Qty: 30 | Fill #0

## 2019-01-21 NOTE — H&P (Signed)
ngrid Montey Hora Documented: 01/21/2019 10:36 AM Location: [facility] Patient #: [id number] DOB: Feb 11, 1961 Married / Language: English / Race: Black or African American Female  History of Present Illness Sabrina [name] A. Waylon Koffler [name]; 01/21/2019 12:42 PM) Patient words: Patient is sent at the request of the [facility] [place] due to abnormal screening mammogram. She underwent a diagnostic mammogram and a right breast mass was encountered. This was felt to be a papilloma. She gives no history of breast pain, breast mass nor nipple discharge. She has a family history with 3 first-degree relatives with breast cancer between the ages of 92 and 81. Biopsy showed atypical tissue consistent with atypical ductal hyperplasia. She presents today to discuss her options and management. She is referred for biopsy but has no other complaints        Screening recall for right breast mass.  EXAM: DIGITAL DIAGNOSTIC BILATERAL MAMMOGRAM WITH CAD AND TOMO  RIGHT BREAST ULTRASOUND  COMPARISON: Previous exam(s).  ACR Breast Density Category a: The breast tissue is almost entirely fatty.  FINDINGS: Spot compression tomograms were performed of the right breast. There is an oval mass with margin irregularity in the periareolar/slightly outer right breast measuring approximately 1.2 cm.  Mammographic images were processed with CAD.  Targeted ultrasound of the outer right breast was performed. There is an intraductal mass in the right breast at 9 o'clock retroareolar measuring approximately 0.5 x 0.5 x 1.1 cm. This corresponds well with the mass seen in the right breast at mammography. No lymphadenopathy seen in the right axilla.  IMPRESSION: Suspicious right breast mass, possibly an intraductal papilloma.  RECOMMENDATION: Ultrasound-guided biopsy of the mass in the right breast is recommended. This is scheduled for 01/06/2019.  I have discussed the findings and recommendations  with the patient. If applicable, a reminder letter will be sent to the patient regarding the next appointment.  BI-RADS CATEGORY 4: Suspicious.       Diagnosis Breast, right, needle core biopsy, retroareolar/9 o'clock - DETACHED ATYPICAL EPITHELIAL FRAGMENTS. - FIBROUS TISSUE WITH FOCAL CHRONIC INFLAMMATION. - SEE MICROSCOPIC DESCRIPTION. Microscopic Comment The specimen consists mostly of fibrous tissue with focal chronic inflammation. There are also minute detached glandular epithelial fragments with atypia which are worrisome for an abnormal ductal proliferative lesion. Follow up is suggested. Called to [facility] on 01/07/19. (JDP:ah 01/07/19) Sabrina [name] Pathologist, Electronic Signature (Case signed 01/07/2019) Specimen Gross and Clinical Information Specimen Comment TIF: 4:20 PM; extracted < 1 min; mass collapsed after the first pass Specimen(s) Obtained: Breast, right, needle core biopsy, retroareolar/9 o'clock Specimen Clinical.  The patient is a 58 year old female.   Past Surgical History (Sabrina [name], [place]; 01/21/2019 10:36 AM) Breast Biopsy Right.  Diagnostic Studies History (Sabrina [name], [place]; 01/21/2019 10:36 AM) Colonoscopy 1-5 years ago Mammogram within last year Pap Smear 1-5 years ago  Allergies (Sabrina [name], [place]; 01/21/2019 10:37 AM) Penicillins Latex Tessalon Perles *COUGH/COLD/ALLERGY* Allergies Reconciled  Medication History (Sabrina [name], [place]; 01/21/2019 10:38 AM) metFORMIN HCl (500MG  Tablet, Oral) Active. Losartan Potassium (100MG  Tablet, Oral) Active. Spironolactone (50MG  Tablet, Oral) Active. Oxybutynin Chloride (5MG  Tablet, Oral) Active. Albuterol Sulfate HFA (108 (90 Base)MCG/ACT Aerosol Soln, Inhalation) Active. Aspirin (81MG  Tablet, Oral) Active. Medications Reconciled  Social History (Sabrina [name], [place]; 01/21/2019 10:36 AM) Caffeine use Carbonated beverages, Coffee,  Tea. No alcohol use No drug use Tobacco use Never smoker.  Family History (Sabrina [name], [place]; 01/21/2019 10:36 AM) Alcohol Abuse Family Members In General.  Arthritis Family Members In General, Mother. Breast Cancer Family Members In General. Cancer Family Members In General. Cerebrovascular Accident Family Members In General. Depression Family Members In General, Mother, Sister. Diabetes Mellitus Family Members In General, Mother, Sister. Heart Disease Family Members In General, Mother. Heart disease in female family member before age 79 Hypertension Family Members In General, Mother. Migraine Headache Family Members In General. Prostate Cancer Family Members In General.  Pregnancy / Birth History (Sabrina [name], [place]; 01/21/2019 10:36 AM) Age at menarche 62 years. Contraceptive History Oral contraceptives. Gravida 0 Para 0  Other Problems (Sabrina [name], [place]; 01/21/2019 10:36 AM) Anxiety Disorder Arthritis Chest pain Depression Diabetes Mellitus Gastroesophageal Reflux Disease Hypercholesterolemia Lump In Breast Migraine Headache     Review of Systems (Sabrina A. Brown RMA; 01/21/2019 10:36 AM) General Not Present- Appetite Loss, Chills, Fatigue, Fever, Night Sweats, Weight Gain and Weight Loss. Skin Not Present- Change in Wart/Mole, Dryness, Hives, Jaundice, New Lesions, Non-Healing Wounds, Rash and Ulcer. HEENT Present- Seasonal Allergies and Wears glasses/contact lenses. Not Present- Earache, Hearing Loss, Hoarseness, Nose Bleed, Oral Ulcers, Ringing in the Ears, Sinus Pain, Sore Throat, Visual Disturbances and Yellow Eyes. Respiratory Present- Chronic Cough. Not Present- Bloody sputum, Difficulty Breathing, Snoring and Wheezing. Breast Present- Breast Mass and Breast Pain. Not Present- Nipple Discharge and Skin Changes. Cardiovascular Present- Leg Cramps. Not Present- Chest Pain, Difficulty Breathing Lying Down, Palpitations,  Rapid Heart Rate, Shortness of Breath and Swelling of Extremities. Female Genitourinary Present- Nocturia. Not Present- Frequency, Painful Urination, Pelvic Pain and Urgency. Musculoskeletal Present- Back Pain. Not Present- Joint Pain, Joint Stiffness, Muscle Pain, Muscle Weakness and Swelling of Extremities. Neurological Not Present- Decreased Memory, Fainting, Headaches, Numbness, Seizures, Tingling, Tremor, Trouble walking and Weakness. Psychiatric Present- Anxiety, Bipolar and Depression. Not Present- Change in Sleep Pattern, Fearful and Frequent crying. Endocrine Not Present- Cold Intolerance, Excessive Hunger, Hair Changes, Heat Intolerance, Hot flashes and New Diabetes. Hematology Not Present- Blood Thinners, Easy Bruising, Excessive bleeding, Gland problems, HIV and Persistent Infections.  Vitals (Sabrina A. Brown RMA; 01/21/2019 10:37 AM) 01/21/2019 10:36 AM Weight: 265.2 lb Height: 64in Body Surface Area: 2.21 m Body Mass Index: 45.52 kg/m  Temp.: 97.31F  Pulse: 82 (Regular)  BP: 128/74 (Sitting, Left Arm, Standard)        Physical Exam (Yoseline Andersson A. Semisi Biela [name]; 01/21/2019 12:43 PM)  General Mental Status-Alert. General Appearance-Consistent with stated age. Hydration-Well hydrated. Voice-Normal.  Head and Neck Head-normocephalic, atraumatic with no lesions or palpable masses. Trachea-midline. Thyroid Gland Characteristics - normal size and consistency.  Chest and Lung Exam Chest and lung exam reveals -quiet, even and easy respiratory effort with no use of accessory muscles and on auscultation, normal breath sounds, no adventitious sounds and normal vocal resonance. Inspection Chest Wall - Normal. Back - normal.  Breast Breast - Left-Symmetric, Non Tender, No Biopsy scars, no Dimpling - Left, No Inflammation, No Lumpectomy scars, No Mastectomy scars, No Peau d' Orange. Breast - Right-Symmetric, Non Tender, No Biopsy scars, no Dimpling -  Right, No Inflammation, No Lumpectomy scars, No Mastectomy scars, No Peau d' Orange. Breast Lump-No Palpable Breast Mass.  Cardiovascular Cardiovascular examination reveals -normal heart sounds, regular rate and rhythm with no murmurs and normal pedal pulses bilaterally.  Neurologic Neurologic evaluation reveals -alert and oriented x 3 with no impairment of recent or remote memory. Mental Status-Normal.  Lymphatic Head & Neck  General Head & Neck Lymphatics: Bilateral - Description - Normal. Axillary  General Axillary Region: Bilateral - Description - Normal. Tenderness -  Non Tender. Femoral & Inguinal  Generalized Femoral & Inguinal Lymphatics: Bilateral - Description - Normal. Tenderness - Non Tender.    Assessment & Plan (Brandan Glauber A. Maxten Shuler [name]; 01/21/2019 12:43 PM)  ATYPICAL DUCTAL HYPERPLASIA OF RIGHT BREAST (N60.91) Impression: Recommend lumpectomy was seed localization. Discussed high risk management and that will be revisited after surgery. Recommend genetic testing. She would like to proceed with right breast seed localized lumpectomy Risk of lumpectomy include bleeding, infection, seroma, more surgery, use of seed/wire, wound care, cosmetic deformity and the need for other treatments, death , blood clots, death. Pt agrees to proceed.  Current Plans Pt Education - [facility] Breast Biopsy HCI: discussed with patient and provided information. You are being scheduled for surgery- Our schedulers will call you.  You should hear from our office's scheduling department within 5 working days about the location, date, and time of surgery. We try to make accommodations for patient's preferences in scheduling surgery, but sometimes the OR schedule or the surgeon's schedule prevents [place] from making those accommodations.  If you have not heard from our office [phone]) in 5 working days, call the office and ask for your surgeon's nurse.  If you have other questions about your  diagnosis, plan, or surgery, call the office and ask for your surgeon's nurse.

## 2019-01-24 ENCOUNTER — Telehealth: Payer: Self-pay | Admitting: Genetic Counselor

## 2019-01-24 ENCOUNTER — Other Ambulatory Visit: Payer: Self-pay | Admitting: Genetic Counselor

## 2019-01-24 DIAGNOSIS — Z803 Family history of malignant neoplasm of breast: Secondary | ICD-10-CM

## 2019-01-24 NOTE — Telephone Encounter (Signed)
Received an urgent genetics referral from Dr. Brantley Stage for [facility]. [name] has been cld and scheduled to see Raquel Sarna on 10/27 at 9am. Pt aware to arrive 15 minutes early.

## 2019-01-25 ENCOUNTER — Inpatient Hospital Stay: Payer: Self-pay

## 2019-01-25 ENCOUNTER — Other Ambulatory Visit: Payer: Self-pay

## 2019-01-25 ENCOUNTER — Other Ambulatory Visit: Payer: Self-pay | Admitting: Surgery

## 2019-01-25 ENCOUNTER — Encounter: Payer: Self-pay | Admitting: Genetic Counselor

## 2019-01-25 ENCOUNTER — Inpatient Hospital Stay: Payer: Self-pay | Attending: Genetic Counselor | Admitting: Genetic Counselor

## 2019-01-25 DIAGNOSIS — Z8042 Family history of malignant neoplasm of prostate: Secondary | ICD-10-CM

## 2019-01-25 DIAGNOSIS — Z803 Family history of malignant neoplasm of breast: Secondary | ICD-10-CM

## 2019-01-25 DIAGNOSIS — N6310 Unspecified lump in the right breast, unspecified quadrant: Secondary | ICD-10-CM

## 2019-01-25 DIAGNOSIS — Z808 Family history of malignant neoplasm of other organs or systems: Secondary | ICD-10-CM | POA: Insufficient documentation

## 2019-01-25 DIAGNOSIS — Z800 Family history of malignant neoplasm of digestive organs: Secondary | ICD-10-CM | POA: Insufficient documentation
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
## 2019-02-01 DIAGNOSIS — Z1379 Encounter for other screening for genetic and chromosomal anomalies: Secondary | ICD-10-CM | POA: Insufficient documentation

## 2019-02-02 ENCOUNTER — Encounter: Payer: Self-pay | Admitting: Genetic Counselor

## 2019-02-02 ENCOUNTER — Telehealth: Payer: Self-pay | Admitting: Genetic Counselor

## 2019-02-02 ENCOUNTER — Ambulatory Visit: Payer: Self-pay | Admitting: Genetic Counselor

## 2019-02-02 DIAGNOSIS — Z1379 Encounter for other screening for genetic and chromosomal anomalies: Secondary | ICD-10-CM

## 2019-02-02 NOTE — Telephone Encounter (Signed)
Revealed negative genetic testing.  Discussed that we do not know why there is cancer in the family.  It is possible that there is a hereditary cause in the family that she did not inherit.  It could also be due to a different gene that we are not testing, or our current technology may not be able detect something.  It will be important for her to keep in contact with genetics to keep up with whether additional testing may be appropriate in the future.  We also discussed a referral to the high-risk breast cancer clinic.  Sabrina [name] is interested.  Genetic testing did detect three variants of uncertain significance (VUS): one in the BRIP1 gene called c.3290A>C, one in the MSH3 gene called c.3072G>C, and one in the RUNX1 gene called c.847C>A.  Her results are still considered normal, and these VUSes should not impact her medical management.

## 2019-02-02 NOTE — Progress Notes (Signed)
HPI:  Ms. Gaskins was previously seen in the [place] clinic due to a personal and family history of breast cancer, and a family history of prostate, brain, and throat cancer, and concerns regarding a hereditary predisposition to cancer. Please refer to our prior cancer genetics clinic note for more information regarding our discussion, assessment and recommendations, at the time. Ms. Akamine recent genetic test results were disclosed to her, as were recommendations warranted by these results. These results and recommendations are discussed in more detail below.  CANCER HISTORY:  Oncology History   No history exists.    FAMILY HISTORY:  We obtained a detailed, 4-generation family history.  Significant diagnoses are listed below: Family History  Problem Relation Age of Onset   Hypertension Mother    Diabetes Mother    Mental illness Mother    Heart disease Mother    Alzheimer's disease Mother    Heart disease Father    Hypertension Father    Diabetes Father    Breast cancer Maternal Aunt 68       mastectomy   Breast cancer Maternal Aunt 107   Heart attack Maternal Grandfather    Hypertension Maternal Grandmother    Stroke Maternal Grandmother    Brain cancer Maternal Grandmother    Emphysema Maternal Grandmother    Hypertension Paternal Grandfather    Cancer Maternal Uncle 77       unknown type   Prostate cancer Maternal Uncle 82   Throat cancer Maternal Uncle 60   Colon cancer Neg Hx     Ms. Slomski does not have children. She has one maternal half-sister who is 80 and has diabetes and had a breast biopsy but no known diagnosis of cancer. Her sister has a son who is 16 and has autism.  Ms. Husted mother died at age 71 and had Alzheimer's disease and mental illness, but no cancer. She has five maternal aunts and four maternal uncles. One aunt had breast cancer diagnosed around age 61, which was treated with mastectomy. Another aunt  had breast cancer that she died from, diagnosed around age 32. Her other three aunts are living in their 38s and 10s and it is unknown, but possible that some of them have had cancer due to periods of "sickness". One of her maternal uncles had prostate cancer that he died from at age 2. Another uncle died from an unknown type of cancer at age 35, and another uncle had throat cancer in his 2s. Her fourth uncle died in infancy due to whooping cough. Ms. Niebla mother also had two half-siblings through her father, one boy and one girl, although little information is known about these individuals. Ms. Dorning's grandmother died of brain cancer at age 28, and her great-grandmother died of breast cancer at age 67. Her grandfather died of a heart attack at age 45.   Ms. Vasseur is unsure of who her father is, but she believes that it may be her maternal grandfather.   Ms. Holtrop is unaware of previous family history of genetic testing for hereditary cancer risks. There is possible consanguinity.  GENETIC TEST RESULTS: Genetic testing reported out on 02/01/2019 through the [facility] Multi-Cancer panel found no pathogenic variants. The Multi-Cancer Panel offered by [facility] includes sequencing and/or deletion duplication testing of the following 85 genes: AIP, ALK, APC, ATM, AXIN2,BAP1,  BARD1, BLM, BMPR1A, BRCA1, BRCA2, BRIP1, CASR, CDC73, CDH1, CDK4, CDKN1B, CDKN1C, CDKN2A (p14ARF), CDKN2A (p16INK4a), CEBPA, CHEK2, CTNNA1, DICER1, DIS3L2, EGFR (c.2369C>T, p.Thr790Met variant  only), EPCAM (Deletion/duplication testing only), FH, FLCN, GATA2, GPC3, GREM1 (Promoter region deletion/duplication testing only), HOXB13 (c.251G>A, p.Gly84Glu), HRAS, KIT, MAX, MEN1, MET, MITF (c.952G>A, p.Glu318Lys variant only), MLH1, MSH2, MSH3, MSH6, MUTYH, NBN, NF1, NF2, NTHL1, PALB2, PDGFRA, PHOX2B, PMS2, POLD1, POLE, POT1, PRKAR1A, PTCH1, PTEN, RAD50, RAD51C, RAD51D, RB1, RECQL4, RET, RNF43, RUNX1, SDHAF2, SDHA (sequence changes  only), SDHB, SDHC, SDHD, SMAD4, SMARCA4, SMARCB1, SMARCE1, STK11, SUFU, TERC, TERT, TMEM127, TP53, TSC1, TSC2, VHL, WRN and WT1. The test report will be scanned into EPIC and located under the Molecular Pathology section of the Results Review tab.  A portion of the result report is included below for reference.     We discussed with Ms. Kanno that because current genetic testing is not perfect, it is possible there may be a gene mutation in one of these genes that current testing cannot detect, but that chance is small.  We also discussed, that there could be another gene that has not yet been discovered, or that we have not yet tested, that is responsible for the cancer diagnoses in the family. It is also possible there is a hereditary cause for the cancer in the family that Ms. Flesch did not inherit and therefore was not identified in her testing.  Therefore, it is important to remain in touch with cancer genetics in the future so that we can continue to offer Ms. Blatter the most up to date genetic testing.   Genetic testing did identify three variants of uncertain significance (VUS) - one in the BRIP1 gene called c.3290A>C, a second in the MSH3 gene called c.3072G>C, and a third in the RUNX1 gene called c.847C>A.  At this time, it is unknown if these variants are associated with increased cancer risk or if they are normal findings, but most variants such as these get reclassified to being inconsequential. They should not be used to make medical management decisions. With time, we suspect the lab will determine the significance of these variants, if any. If we do learn more about them, we will try to contact Ms. Enriques to discuss it further. However, it is important to stay in touch with [place] periodically and keep the address and phone number up to date.  ADDITIONAL GENETIC TESTING:  We discussed with Ms. Hoch that her genetic testing was fairly extensive.  If there are genes identified to  increase cancer risk that can be analyzed in the future, we would be happy to discuss and coordinate this testing at that time.    CANCER SCREENING RECOMMENDATIONS: Ms. Frick test result is considered negative (normal).  This means that we have not identified a hereditary cause for her personal and family history of cancer at this time. Most cancers happen by chance and this negative test suggests that her cancer may fall into this category.    While reassuring, this does not definitively rule out a hereditary predisposition to cancer. It is still possible that there could be genetic mutations that are undetectable by current technology. There could be genetic mutations in genes that have not been tested or identified to increase cancer risk.  Therefore, it is recommended she continue to follow the cancer management and screening guidelines provided by her healthcare providers.   An individual's cancer risk and medical management are not determined by genetic test results alone. Overall cancer risk assessment incorporates additional factors, including personal medical history, family history, and any available genetic information that may result in a personalized plan for cancer prevention and surveillance.  Based on Ms. Przybylski's personal and family of cancer, as well as her genetic test results, statistical models (Tyrer-Cuzick and the [place]) were used to estimate her risk of developing breast cancer. These estimate her lifetime risk of developing breast cancer to be approximately 27%.  The patient's lifetime breast cancer risk is a preliminary estimate based on available information using one of several models endorsed by the [place] (ACS). The ACS recommends consideration of breast MRI screening as an adjunct to mammography for patients at high risk (defined as 20% or greater lifetime risk). A more detailed breast cancer risk assessment can be considered, if clinically  indicated. The [place] estimated that Ms. Johannesen's 5-year risk to develop breast cancer is approximately 2.7%. It is recommended that individuals consider chemoprevention if their 5-year breast cancer risk is 1.7% or greater.  Ms. Dillavou has been determined to be at high risk for breast cancer.  We, therefore, discussed that it is reasonable for Ms. Hoge to be followed by a high-risk breast cancer clinic; in addition to a yearly mammogram and physical exam by a healthcare provider, she should discuss the usefulness of an annual breast MRI with the high-risk clinic providers.  We have referred Ms. Chronis to the High-Risk Breast Cancer clinic.  Tyrer-Cuzick:  Baker Janus Model:    RECOMMENDATIONS FOR FAMILY MEMBERS:  Individuals in this family might be at some increased risk of developing cancer, over the general population risk, simply due to the family history of cancer.  We recommended women in this family have a yearly mammogram beginning at age 40, or 60 years younger than the earliest onset of cancer, an annual clinical breast exam, and perform monthly breast self-exams. Women in this family should also have a gynecological exam as recommended by their primary provider. All family members should have a colonoscopy by age 32.  It is also possible there is a hereditary cause for the cancer in Ms. Bouyer's family that she did not inherit and therefore was not identified in her.  Based on Ms. Brownstein's family history, we recommended her maternal aunt, who was diagnosed with breast cancer at age 90, have genetic counseling and testing. Ms. Bordeaux will let us know if we can be of any assistance in coordinating genetic counseling and/or testing for this family member.   FOLLOW-UP: Lastly, we discussed with Ms. Zenk that cancer genetics is a rapidly advancing field and it is possible that new genetic tests will be appropriate for her and/or her family members in the future. We encouraged her  to remain in contact with cancer genetics on an annual basis so we can update her personal and family histories and let her know of advances in cancer genetics that may benefit this family.   Our contact number was provided. Ms. Menton questions were answered to her satisfaction, and she knows she is welcome to call us at anytime with additional questions or concerns.   Clint Guy, MS, [facility] Certified Genetic Counselor [email or web address] Phone: [phone]

## 2019-02-03 MED FILL — ?CETIRIZINE HCL 10 MG TABLE: 10 | 30 days supply | Qty: 30 | Fill #2

## 2019-02-03 MED FILL — IBUPROFEN 600 MG TABLET: 600 | 5 days supply | Qty: 20 | Fill #0

## 2019-02-03 MED FILL — OXYBUTYNIN 5 MG TABLET: 5 | 30 days supply | Qty: 60 | Fill #6

## 2019-02-03 MED FILL — LOSARTAN POTASSIUM 100 MG T: 100 | 30 days supply | Qty: 30 | Fill #2

## 2019-02-09 ENCOUNTER — Telehealth: Payer: Self-pay | Admitting: Genetic Counselor

## 2019-02-09 NOTE — Telephone Encounter (Signed)
-----   Message from Clint Guy sent at [id number]  9:15 AM EST ----- Negative genetic testing, but high-risk for breast cancer. Interested in high-risk breast clinic.

## 2019-02-09 NOTE — Telephone Encounter (Signed)
Sabrina [name] has been cld and scheduled to see Dr. Lindi Adie for the high risk clinic on 12/22 at 1pm. Pt aware to arrive 15 minutes early.

## 2019-02-16 MED FILL — ?SPIRONOLACTON 50MG TABLET: 50 | 30 days supply | Qty: 30 | Fill #2

## 2019-02-17 ENCOUNTER — Other Ambulatory Visit: Payer: Self-pay

## 2019-02-18 ENCOUNTER — Encounter (HOSPITAL_BASED_OUTPATIENT_CLINIC_OR_DEPARTMENT_OTHER)
Admission: RE | Admit: 2019-02-18 | Discharge: 2019-02-18 | Disposition: A | Discharge status: Home / Self Care | Payer: No Typology Code available for payment source | Source: Ambulatory Visit | Attending: Surgery | Admitting: Surgery

## 2019-02-18 ENCOUNTER — Other Ambulatory Visit (HOSPITAL_COMMUNITY)
Admission: RE | Admit: 2019-02-18 | Discharge: 2019-02-18 | Disposition: A | Discharge status: Home / Self Care | Payer: Medicaid Other | Source: Ambulatory Visit | Attending: Surgery | Admitting: Surgery

## 2019-02-18 DIAGNOSIS — E119 Type 2 diabetes mellitus without complications: Secondary | ICD-10-CM | POA: Diagnosis not present

## 2019-02-18 DIAGNOSIS — D0511 Intraductal carcinoma in situ of right breast: Secondary | ICD-10-CM | POA: Diagnosis not present

## 2019-02-18 DIAGNOSIS — G4730 Sleep apnea, unspecified: Secondary | ICD-10-CM | POA: Diagnosis not present

## 2019-02-18 DIAGNOSIS — D241 Benign neoplasm of right breast: Secondary | ICD-10-CM | POA: Diagnosis present

## 2019-02-18 DIAGNOSIS — Z20828 Contact with and (suspected) exposure to other viral communicable diseases: Secondary | ICD-10-CM | POA: Insufficient documentation

## 2019-02-18 DIAGNOSIS — K219 Gastro-esophageal reflux disease without esophagitis: Secondary | ICD-10-CM | POA: Diagnosis not present

## 2019-02-18 DIAGNOSIS — I10 Essential (primary) hypertension: Secondary | ICD-10-CM | POA: Diagnosis not present

## 2019-02-18 DIAGNOSIS — Z7984 Long term (current) use of oral hypoglycemic drugs: Secondary | ICD-10-CM | POA: Diagnosis not present

## 2019-02-18 DIAGNOSIS — Z01812 Encounter for preprocedural laboratory examination: Secondary | ICD-10-CM | POA: Insufficient documentation

## 2019-02-18 DIAGNOSIS — E7800 Pure hypercholesterolemia, unspecified: Secondary | ICD-10-CM | POA: Diagnosis not present

## 2019-02-18 DIAGNOSIS — Z803 Family history of malignant neoplasm of breast: Secondary | ICD-10-CM | POA: Diagnosis not present

## 2019-02-18 DIAGNOSIS — F329 Major depressive disorder, single episode, unspecified: Secondary | ICD-10-CM | POA: Diagnosis not present

## 2019-02-18 DIAGNOSIS — Z7982 Long term (current) use of aspirin: Secondary | ICD-10-CM | POA: Diagnosis not present

## 2019-02-18 DIAGNOSIS — Z79899 Other long term (current) drug therapy: Secondary | ICD-10-CM | POA: Diagnosis not present

## 2019-02-18 DIAGNOSIS — J449 Chronic obstructive pulmonary disease, unspecified: Secondary | ICD-10-CM | POA: Diagnosis not present

## 2019-02-18 DIAGNOSIS — Z6841 Body Mass Index (BMI) 40.0 and over, adult: Secondary | ICD-10-CM | POA: Diagnosis not present

## 2019-02-18 LAB — CBC WITH DIFFERENTIAL/PLATELET
Abs Immature Granulocytes: 0.02 10*3/uL (ref 0.00–0.07)
Basophils Absolute: 0.1 10*3/uL (ref 0.0–0.1)
Basophils Relative: 1 %
Eosinophils Absolute: 0.1 10*3/uL (ref 0.0–0.5)
Eosinophils Relative: 2 %
HCT: 38.8 % (ref 36.0–46.0)
Hemoglobin: 12.9 g/dL (ref 12.0–15.0)
Immature Granulocytes: 0 %
Lymphocytes Relative: 38 %
Lymphs Abs: 3.5 10*3/uL (ref 0.7–4.0)
MCH: 32.4 pg (ref 26.0–34.0)
MCHC: 33.2 g/dL (ref 30.0–36.0)
MCV: 97.5 fL (ref 80.0–100.0)
Monocytes Absolute: 0.9 10*3/uL (ref 0.1–1.0)
Monocytes Relative: 10 %
Neutro Abs: 4.5 10*3/uL (ref 1.7–7.7)
Neutrophils Relative %: 49 %
Platelets: 356 10*3/uL (ref 150–400)
RBC: 3.98 MIL/uL (ref 3.87–5.11)
RDW: 13.2 % (ref 11.5–15.5)
WBC: 9.2 10*3/uL (ref 4.0–10.5)
nRBC: 0 % (ref 0.0–0.2)

## 2019-02-18 LAB — COMPREHENSIVE METABOLIC PANEL
ALT: 27 U/L (ref 0–44)
AST: 24 U/L (ref 15–41)
Albumin: 3.5 g/dL (ref 3.5–5.0)
Alkaline Phosphatase: 65 U/L (ref 38–126)
Anion gap: 10 (ref 5–15)
BUN: 12 mg/dL (ref 6–20)
CO2: 25 mmol/L (ref 22–32)
Calcium: 9.4 mg/dL (ref 8.9–10.3)
Chloride: 103 mmol/L (ref 98–111)
Creatinine, Ser: 1 mg/dL (ref 0.44–1.00)
GFR calc Af Amer: >60 mL/min (ref >60)
GFR calc non Af Amer: >60 mL/min (ref >60)
Glucose, Bld: 99 mg/dL (ref 70–99)
Potassium: 4.1 mmol/L (ref 3.5–5.1)
Sodium: 138 mmol/L (ref 135–145)
Total Bilirubin: 0.3 mg/dL (ref 0.3–1.2)
Total Protein: 7.5 g/dL (ref 6.5–8.1)

## 2019-02-18 LAB — SARS CORONAVIRUS 2 (TAT 6-24 HRS): SARS Coronavirus 2: NEGATIVE

## 2019-02-21 ENCOUNTER — Other Ambulatory Visit: Payer: Self-pay

## 2019-02-21 ENCOUNTER — Ambulatory Visit
Admission: RE | Admit: 2019-02-21 | Discharge: 2019-02-21 | Disposition: A | Discharge status: Home / Self Care | Payer: No Typology Code available for payment source | Source: Ambulatory Visit | Attending: Surgery | Admitting: Surgery

## 2019-02-21 DIAGNOSIS — N6310 Unspecified lump in the right breast, unspecified quadrant: Secondary | ICD-10-CM

## 2019-02-21 NOTE — Progress Notes (Signed)
      Enhanced Recovery after Surgery for Orthopedics Enhanced Recovery after Surgery is a protocol used to improve the stress on your body and your recovery after surgery.  Patient Instructions  . The night before surgery:  o No food after midnight. ONLY clear liquids after midnight  . The day of surgery (if you do NOT have diabetes):  o Drink ONE (1) Pre-Surgery Clear Ensure as directed.   o This drink was given to you during your hospital  pre-op appointment visit. o The pre-op nurse will instruct you on the time to drink the  Pre-Surgery Ensure depending on your surgery time. o Finish the drink at the designated time by the pre-op nurse.  o Nothing else to drink after completing the  Pre-Surgery Clear Ensure.  . The day of surgery (if you have diabetes): o Drink ONE (1) Gatorade 2 (G2) as directed. o This drink was given to you during your hospital  pre-op appointment visit.  o The pre-op nurse will instruct you on the time to drink the   Gatorade 2 (G2) depending on your surgery time. o Color of the Gatorade may vary. Red is not allowed. o Nothing else to drink after completing the  Gatorade 2 (G2).         If you have questions, please contact your surgeon's office.  Given to her husband with instructions.

## 2019-02-22 ENCOUNTER — Ambulatory Visit (HOSPITAL_BASED_OUTPATIENT_CLINIC_OR_DEPARTMENT_OTHER): Payer: Medicaid Other | Admitting: Certified Registered Nurse Anesthetist

## 2019-02-22 ENCOUNTER — Encounter (HOSPITAL_BASED_OUTPATIENT_CLINIC_OR_DEPARTMENT_OTHER)
Admission: RE | Disposition: A | Discharge status: Home / Self Care | Payer: Self-pay | Source: Home / Self Care | Attending: Surgery

## 2019-02-22 ENCOUNTER — Other Ambulatory Visit: Payer: Self-pay

## 2019-02-22 ENCOUNTER — Ambulatory Visit
Admission: RE | Admit: 2019-02-22 | Discharge: 2019-02-22 | Disposition: A | Discharge status: Home / Self Care | Payer: No Typology Code available for payment source | Source: Ambulatory Visit | Attending: Surgery | Admitting: Surgery

## 2019-02-22 ENCOUNTER — Encounter (HOSPITAL_BASED_OUTPATIENT_CLINIC_OR_DEPARTMENT_OTHER): Payer: Self-pay

## 2019-02-22 ENCOUNTER — Ambulatory Visit (HOSPITAL_BASED_OUTPATIENT_CLINIC_OR_DEPARTMENT_OTHER)
Admission: RE | Admit: 2019-02-22 | Discharge: 2019-02-22 | Disposition: A | Discharge status: Home / Self Care | Payer: Medicaid Other | Attending: Surgery | Admitting: Surgery

## 2019-02-22 DIAGNOSIS — Z79899 Other long term (current) drug therapy: Secondary | ICD-10-CM | POA: Insufficient documentation

## 2019-02-22 DIAGNOSIS — J449 Chronic obstructive pulmonary disease, unspecified: Secondary | ICD-10-CM | POA: Diagnosis not present

## 2019-02-22 DIAGNOSIS — E119 Type 2 diabetes mellitus without complications: Secondary | ICD-10-CM | POA: Diagnosis not present

## 2019-02-22 DIAGNOSIS — Z6841 Body Mass Index (BMI) 40.0 and over, adult: Secondary | ICD-10-CM | POA: Insufficient documentation

## 2019-02-22 DIAGNOSIS — D0511 Intraductal carcinoma in situ of right breast: Secondary | ICD-10-CM | POA: Insufficient documentation

## 2019-02-22 DIAGNOSIS — N6310 Unspecified lump in the right breast, unspecified quadrant: Secondary | ICD-10-CM

## 2019-02-22 DIAGNOSIS — Z7984 Long term (current) use of oral hypoglycemic drugs: Secondary | ICD-10-CM | POA: Insufficient documentation

## 2019-02-22 DIAGNOSIS — G4730 Sleep apnea, unspecified: Secondary | ICD-10-CM | POA: Diagnosis not present

## 2019-02-22 DIAGNOSIS — I10 Essential (primary) hypertension: Secondary | ICD-10-CM | POA: Insufficient documentation

## 2019-02-22 DIAGNOSIS — Z803 Family history of malignant neoplasm of breast: Secondary | ICD-10-CM | POA: Insufficient documentation

## 2019-02-22 DIAGNOSIS — F329 Major depressive disorder, single episode, unspecified: Secondary | ICD-10-CM | POA: Insufficient documentation

## 2019-02-22 DIAGNOSIS — K219 Gastro-esophageal reflux disease without esophagitis: Secondary | ICD-10-CM | POA: Insufficient documentation

## 2019-02-22 DIAGNOSIS — Z7982 Long term (current) use of aspirin: Secondary | ICD-10-CM | POA: Insufficient documentation

## 2019-02-22 DIAGNOSIS — E7800 Pure hypercholesterolemia, unspecified: Secondary | ICD-10-CM | POA: Insufficient documentation

## 2019-02-22 HISTORY — PX: BREAST LUMPECTOMY WITH RADIOACTIVE SEED LOCALIZATION: SHX6424

## 2019-02-22 LAB — GLUCOSE, CAPILLARY
Glucose-Capillary: 80 mg/dL (ref 70–99)
Glucose-Capillary: 80 mg/dL (ref 70–99)

## 2019-02-22 SURGERY — BREAST LUMPECTOMY WITH RADIOACTIVE SEED LOCALIZATION
Anesthesia: General | Site: Breast | Laterality: Right

## 2019-02-22 MED ORDER — HYDROMORPHONE HCL 1 MG/ML IJ SOLN
0.2500 mg | INTRAMUSCULAR | Status: DC | PRN
  Administered 2019-02-22: 0.5 mg via INTRAVENOUS

## 2019-02-22 MED ORDER — PHENYLEPHRINE 40 MCG/ML (10ML) SYRINGE FOR IV PUSH (FOR BLOOD PRESSURE SUPPORT)
PREFILLED_SYRINGE | INTRAVENOUS | Status: DC | PRN
  Administered 2019-02-22 (×3): 80 ug via INTRAVENOUS

## 2019-02-22 MED ORDER — DEXAMETHASONE SODIUM PHOSPHATE 10 MG/ML IJ SOLN
INTRAMUSCULAR | Status: AC
  Filled 2019-02-22: qty 1

## 2019-02-22 MED ORDER — LACTATED RINGERS IV SOLN
INTRAVENOUS | Status: DC
  Administered 2019-02-22: 10:00:00 via INTRAVENOUS

## 2019-02-22 MED ORDER — FENTANYL CITRATE (PF) 100 MCG/2ML IJ SOLN
INTRAMUSCULAR | Status: DC | PRN
  Administered 2019-02-22 (×2): 25 ug via INTRAVENOUS

## 2019-02-22 MED ORDER — ONDANSETRON 4 MG PO TBDP
4.0000 mg | ORAL_TABLET | Freq: Once | ORAL | Status: AC
  Administered 2019-02-22: 4 mg via ORAL

## 2019-02-22 MED ORDER — DEXAMETHASONE SODIUM PHOSPHATE 10 MG/ML IJ SOLN
INTRAMUSCULAR | Status: DC | PRN
  Administered 2019-02-22: 5 mg via INTRAVENOUS

## 2019-02-22 MED ORDER — PROPOFOL 10 MG/ML IV BOLUS
INTRAVENOUS | Status: DC | PRN
  Administered 2019-02-22: 130 mg via INTRAVENOUS

## 2019-02-22 MED ORDER — PROPOFOL 10 MG/ML IV BOLUS
INTRAVENOUS | Status: AC
  Filled 2019-02-22: qty 20

## 2019-02-22 MED ORDER — ONDANSETRON 4 MG PO TBDP
ORAL_TABLET | ORAL | Status: AC
  Filled 2019-02-22: qty 1

## 2019-02-22 MED ORDER — LIDOCAINE 2% (20 MG/ML) 5 ML SYRINGE
INTRAMUSCULAR | Status: AC
  Filled 2019-02-22: qty 5

## 2019-02-22 MED ORDER — HYDROMORPHONE HCL 1 MG/ML IJ SOLN
INTRAMUSCULAR | Status: AC
  Filled 2019-02-22: qty 0.5

## 2019-02-22 MED ORDER — ONDANSETRON HCL 4 MG/2ML IJ SOLN
INTRAMUSCULAR | Status: DC | PRN
  Administered 2019-02-22: 4 mg via INTRAVENOUS

## 2019-02-22 MED ORDER — GABAPENTIN 300 MG PO CAPS
ORAL_CAPSULE | ORAL | Status: AC
  Filled 2019-02-22: qty 1

## 2019-02-22 MED ORDER — CHLORHEXIDINE GLUCONATE CLOTH 2 % EX PADS: 6.0000 | MEDICATED_PAD | Freq: Once | CUTANEOUS | Status: DC

## 2019-02-22 MED ORDER — PHENYLEPHRINE 40 MCG/ML (10ML) SYRINGE FOR IV PUSH (FOR BLOOD PRESSURE SUPPORT)
PREFILLED_SYRINGE | INTRAVENOUS | Status: AC
  Filled 2019-02-22: qty 10

## 2019-02-22 MED ORDER — LIDOCAINE HCL (CARDIAC) PF 100 MG/5ML IV SOSY
PREFILLED_SYRINGE | INTRAVENOUS | Status: DC | PRN
  Administered 2019-02-22: 100 mg via INTRAVENOUS

## 2019-02-22 MED ORDER — MIDAZOLAM HCL 2 MG/2ML IJ SOLN
INTRAMUSCULAR | Status: DC | PRN
  Administered 2019-02-22: 2 mg via INTRAVENOUS

## 2019-02-22 MED ORDER — MIDAZOLAM HCL 2 MG/2ML IJ SOLN: 1.0000 mg | INTRAMUSCULAR | Status: DC | PRN

## 2019-02-22 MED ORDER — FENTANYL CITRATE (PF) 100 MCG/2ML IJ SOLN
INTRAMUSCULAR | Status: AC
  Filled 2019-02-22: qty 2

## 2019-02-22 MED ORDER — BUPIVACAINE HCL (PF) 0.25 % IJ SOLN
INTRAMUSCULAR | Status: DC | PRN
  Administered 2019-02-22: 10 mL

## 2019-02-22 MED ORDER — MEPERIDINE HCL 25 MG/ML IJ SOLN: 6.2500 mg | INTRAMUSCULAR | Status: DC | PRN

## 2019-02-22 MED ORDER — IBUPROFEN 800 MG PO TABS: 800.0000 mg | ORAL_TABLET | Freq: Three times a day (TID) | ORAL | 0 refills | Status: DC | PRN

## 2019-02-22 MED ORDER — PROMETHAZINE HCL 25 MG/ML IJ SOLN: 6.2500 mg | INTRAMUSCULAR | Status: DC | PRN

## 2019-02-22 MED ORDER — ONDANSETRON HCL 4 MG/2ML IJ SOLN
INTRAMUSCULAR | Status: AC
  Filled 2019-02-22: qty 2

## 2019-02-22 MED ORDER — CELECOXIB 200 MG PO CAPS
200.0000 mg | ORAL_CAPSULE | ORAL | Status: AC
  Administered 2019-02-22: 200 mg via ORAL

## 2019-02-22 MED ORDER — MIDAZOLAM HCL 2 MG/2ML IJ SOLN
INTRAMUSCULAR | Status: AC
  Filled 2019-02-22: qty 2

## 2019-02-22 MED ORDER — CELECOXIB 200 MG PO CAPS
ORAL_CAPSULE | ORAL | Status: AC
  Filled 2019-02-22: qty 1

## 2019-02-22 MED ORDER — FENTANYL CITRATE (PF) 100 MCG/2ML IJ SOLN: 50.0000 ug | INTRAMUSCULAR | Status: DC | PRN

## 2019-02-22 MED ORDER — CLINDAMYCIN PHOSPHATE 900 MG/50ML IV SOLN
900.0000 mg | INTRAVENOUS | Status: AC
  Administered 2019-02-22: 900 mg via INTRAVENOUS

## 2019-02-22 MED ORDER — GABAPENTIN 300 MG PO CAPS
300.0000 mg | ORAL_CAPSULE | ORAL | Status: AC
  Administered 2019-02-22: 300 mg via ORAL

## 2019-02-22 MED ORDER — EPHEDRINE SULFATE 50 MG/ML IJ SOLN
INTRAMUSCULAR | Status: DC | PRN
  Administered 2019-02-22: 10 mg via INTRAVENOUS

## 2019-02-22 MED ORDER — CLINDAMYCIN PHOSPHATE 900 MG/50ML IV SOLN
INTRAVENOUS | Status: AC
  Filled 2019-02-22: qty 50

## 2019-02-22 SURGICAL SUPPLY — 50 items
ADH SKN CLS APL DERMABOND .7 (GAUZE/BANDAGES/DRESSINGS) ×1
APL PRP STRL LF DISP 70% ISPRP (MISCELLANEOUS) ×1
APPLIER CLIP 9.375 MED OPEN (MISCELLANEOUS)
APR CLP MED 9.3 20 MLT OPN (MISCELLANEOUS)
BINDER BREAST 3XL (GAUZE/BANDAGES/DRESSINGS) ×1 IMPLANT
BINDER BREAST XXLRG (GAUZE/BANDAGES/DRESSINGS) IMPLANT
BLADE SURG 15 STRL LF DISP TIS (BLADE) ×1 IMPLANT
BLADE SURG 15 STRL SS (BLADE) ×2
CANISTER SUC SOCK COL 7IN (MISCELLANEOUS) IMPLANT
CANISTER SUCT 1200ML W/VALVE (MISCELLANEOUS) IMPLANT
CHLORAPREP W/TINT 26 (MISCELLANEOUS) ×2 IMPLANT
CLIP APPLIE 9.375 MED OPEN (MISCELLANEOUS) IMPLANT
COVER BACK TABLE REUSABLE LG (DRAPES) ×2 IMPLANT
COVER MAYO STAND REUSABLE (DRAPES) ×2 IMPLANT
COVER PROBE W GEL 5X96 (DRAPES) ×2 IMPLANT
COVER WAND RF STERILE (DRAPES) IMPLANT
DERMABOND ADVANCED (GAUZE/BANDAGES/DRESSINGS) ×1
DERMABOND ADVANCED .7 DNX12 (GAUZE/BANDAGES/DRESSINGS) ×1 IMPLANT
DRAPE LAPAROSCOPIC ABDOMINAL (DRAPES) ×1 IMPLANT
DRAPE UTILITY XL STRL (DRAPES) ×2 IMPLANT
ELECT COATED BLADE 2.86 ST (ELECTRODE) ×2 IMPLANT
ELECT REM PT RETURN 9FT ADLT (ELECTROSURGICAL) ×2
ELECTRODE REM PT RTRN 9FT ADLT (ELECTROSURGICAL) ×1 IMPLANT
GLOVE BIO SURGEON STRL SZ 6.5 (GLOVE) ×1 IMPLANT
GLOVE BIOGEL PI IND STRL 7.0 (GLOVE) IMPLANT
GLOVE BIOGEL PI IND STRL 8 (GLOVE) ×1 IMPLANT
GLOVE BIOGEL PI INDICATOR 7.0 (GLOVE) ×1
GLOVE BIOGEL PI INDICATOR 8 (GLOVE) ×1
GLOVE SURG SS PI 6.5 STRL IVOR (GLOVE) ×1 IMPLANT
GLOVE SURG SS PI 8.0 STRL IVOR (GLOVE) ×1 IMPLANT
GOWN STRL REUS W/ TWL LRG LVL3 (GOWN DISPOSABLE) ×2 IMPLANT
GOWN STRL REUS W/TWL LRG LVL3 (GOWN DISPOSABLE) ×4
HEMOSTAT ARISTA ABSORB 3G PWDR (HEMOSTASIS) IMPLANT
HEMOSTAT SNOW SURGICEL 2X4 (HEMOSTASIS) IMPLANT
KIT MARKER MARGIN INK (KITS) ×2 IMPLANT
NDL HYPO 25X1 1.5 SAFETY (NEEDLE) ×1 IMPLANT
NEEDLE HYPO 25X1 1.5 SAFETY (NEEDLE) ×2 IMPLANT
NS IRRIG 1000ML POUR BTL (IV SOLUTION) ×2 IMPLANT
PACK BASIN DAY SURGERY FS (CUSTOM PROCEDURE TRAY) ×2 IMPLANT
PENCIL SMOKE EVACUATOR (MISCELLANEOUS) ×2 IMPLANT
SLEEVE SCD COMPRESS KNEE MED (MISCELLANEOUS) ×2 IMPLANT
SPONGE LAP 4X18 RFD (DISPOSABLE) ×2 IMPLANT
SUT MNCRL AB 4-0 PS2 18 (SUTURE) ×2 IMPLANT
SUT SILK 2 0 SH (SUTURE) IMPLANT
SUT VICRYL 3-0 CR8 SH (SUTURE) ×2 IMPLANT
SYR CONTROL 10ML LL (SYRINGE) ×2 IMPLANT
TOWEL GREEN STERILE FF (TOWEL DISPOSABLE) ×2 IMPLANT
TRAY FAXITRON CT DISP (TRAY / TRAY PROCEDURE) ×2 IMPLANT
TUBE CONNECTING 20X1/4 (TUBING) IMPLANT
YANKAUER SUCT BULB TIP NO VENT (SUCTIONS) IMPLANT

## 2019-02-22 NOTE — H&P (Signed)
ngrid Montey Hora Documented: 01/21/2019 10:36 AM Location: [facility] Patient #: [id number] DOB: 05-18-1960 Married / Language: English / Race: Black or African American Female  History of Present Illness [name] A. Elchonon Maxson MD; 01/21/2019 12:42 PM) Patient words: Patient is sent at the request of the [facility] [place] due to abnormal screening mammogram. She underwent a diagnostic mammogram and a right breast mass was encountered. This was felt to be a papilloma. She gives no history of breast pain, breast mass nor nipple discharge. She has a family history with 3 first-degree relatives with breast cancer between the ages of 27 and 32. Biopsy showed atypical tissue consistent with atypical ductal hyperplasia. She presents today to discuss her options and management. She is referred for biopsy but has no other complaints        Screening recall for right breast mass.  EXAM: DIGITAL DIAGNOSTIC BILATERAL MAMMOGRAM WITH CAD AND TOMO  RIGHT BREAST ULTRASOUND  COMPARISON: Previous exam(s).  ACR Breast Density Category a: The breast tissue is almost entirely fatty.  FINDINGS: Spot compression tomograms were performed of the right breast. There is an oval mass with margin irregularity in the periareolar/slightly outer right breast measuring approximately 1.2 cm.  Mammographic images were processed with CAD.  Targeted ultrasound of the outer right breast was performed. There is an intraductal mass in the right breast at 9 o'clock retroareolar measuring approximately 0.5 x 0.5 x 1.1 cm. This corresponds well with the mass seen in the right breast at mammography. No lymphadenopathy seen in the right axilla.  IMPRESSION: Suspicious right breast mass, possibly an intraductal papilloma.  RECOMMENDATION: Ultrasound-guided biopsy of the mass in the right breast is recommended. This is scheduled for 01/06/2019.  I have discussed the findings  and recommendations with the patient. If applicable, a reminder letter will be sent to the patient regarding the next appointment.  BI-RADS CATEGORY 4: Suspicious.       Diagnosis Breast, right, needle core biopsy, retroareolar/9 o'clock - DETACHED ATYPICAL EPITHELIAL FRAGMENTS. - FIBROUS TISSUE WITH FOCAL CHRONIC INFLAMMATION. - SEE MICROSCOPIC DESCRIPTION. Microscopic Comment The specimen consists mostly of fibrous tissue with focal chronic inflammation. There are also minute detached glandular epithelial fragments with atypia which are worrisome for an abnormal ductal proliferative lesion. Follow up is suggested. Called to [facility] on 01/07/19. (JDP:ah 01/07/19) [name] Pathologist, Electronic Signature (Case signed 01/07/2019) Specimen Gross and Clinical Information Specimen Comment TIF: 4:20 PM; extracted < 1 min; mass collapsed after the first pass Specimen(s) Obtained: Breast, right, needle core biopsy, retroareolar/9 o'clock Specimen Clinical.  The patient is a 58 year old female.   Past Surgical History (Tanisha A. Owens Shark, [place]; 01/21/2019 10:36 AM) Breast Biopsy Right.  Diagnostic Studies History (Tanisha A. Owens Shark, [place]; 01/21/2019 10:36 AM) Colonoscopy 1-5 years ago Mammogram within last year Pap Smear 1-5 years ago  Allergies (Tanisha A. Owens Shark, [place]; 01/21/2019 10:37 AM) Penicillins Latex Tessalon Perles *COUGH/COLD/ALLERGY* Allergies Reconciled  Medication History (Tanisha A. Owens Shark, [place]; 01/21/2019 10:38 AM) metFORMIN HCl (500MG  Tablet, Oral) Active. Losartan Potassium (100MG  Tablet, Oral) Active. Spironolactone (50MG  Tablet, Oral) Active. Oxybutynin Chloride (5MG  Tablet, Oral) Active. Albuterol Sulfate HFA (108 (90 Base)MCG/ACT Aerosol Soln, Inhalation) Active. Aspirin (81MG  Tablet, Oral) Active. Medications Reconciled  Social History (Tanisha A. Owens Shark, [place]; 01/21/2019 10:36 AM) Caffeine  use Carbonated beverages, Coffee, Tea. No alcohol use No drug use Tobacco use Never smoker.  Family History (Tanisha A. Owens Shark, [place]; 01/21/2019 10:36 AM) Alcohol Abuse Family Members In General.  Arthritis Family Members In General, Mother. Breast Cancer Family Members In General. Cancer Family Members In General. Cerebrovascular Accident Family Members In General. Depression Family Members In General, Mother, Sister. Diabetes Mellitus Family Members In General, Mother, Sister. Heart Disease Family Members In General, Mother. Heart disease in female family member before age 24 Hypertension Family Members In General, Mother. Migraine Headache Family Members In General. Prostate Cancer Family Members In General.  Pregnancy / Birth History (Tanisha A. Owens Shark, [place]; 01/21/2019 10:36 AM) Age at menarche 72 years. Contraceptive History Oral contraceptives. Gravida 0 Para 0  Other Problems (Tanisha A. Owens Shark, [place]; 01/21/2019 10:36 AM) Anxiety Disorder Arthritis Chest pain Depression Diabetes Mellitus Gastroesophageal Reflux Disease Hypercholesterolemia Lump In Breast Migraine Headache     Review of Systems (Tanisha A. Brown RMA; 01/21/2019 10:36 AM) General Not Present- Appetite Loss, Chills, Fatigue, Fever, Night Sweats, Weight Gain and Weight Loss. Skin Not Present- Change in Wart/Mole, Dryness, Hives, Jaundice, New Lesions, Non-Healing Wounds, Rash and Ulcer. HEENT Present- Seasonal Allergies and Wears glasses/contact lenses. Not Present- Earache, Hearing Loss, Hoarseness, Nose Bleed, Oral Ulcers, Ringing in the Ears, Sinus Pain, Sore Throat, Visual Disturbances and Yellow Eyes. Respiratory Present- Chronic Cough. Not Present- Bloody sputum, Difficulty Breathing, Snoring and Wheezing. Breast Present- Breast Mass and Breast Pain. Not Present- Nipple Discharge and Skin Changes. Cardiovascular Present- Leg Cramps. Not Present- Chest Pain,  Difficulty Breathing Lying Down, Palpitations, Rapid Heart Rate, Shortness of Breath and Swelling of Extremities. Female Genitourinary Present- Nocturia. Not Present- Frequency, Painful Urination, Pelvic Pain and Urgency. Musculoskeletal Present- Back Pain. Not Present- Joint Pain, Joint Stiffness, Muscle Pain, Muscle Weakness and Swelling of Extremities. Neurological Not Present- Decreased Memory, Fainting, Headaches, Numbness, Seizures, Tingling, Tremor, Trouble walking and Weakness. Psychiatric Present- Anxiety, Bipolar and Depression. Not Present- Change in Sleep Pattern, Fearful and Frequent crying. Endocrine Not Present- Cold Intolerance, Excessive Hunger, Hair Changes, Heat Intolerance, Hot flashes and New Diabetes. Hematology Not Present- Blood Thinners, Easy Bruising, Excessive bleeding, Gland problems, HIV and Persistent Infections.  Vitals (Tanisha A. Brown RMA; 01/21/2019 10:37 AM) 01/21/2019 10:36 AM Weight: 265.2 lb Height: 64in Body Surface Area: 2.21 m Body Mass Index: 45.52 kg/m  Temp.: 97.84F  Pulse: 82 (Regular)  BP: 128/74 (Sitting, Left Arm, Standard)        Physical Exam (Mariko Nowakowski A. Antonius Hartlage MD; 01/21/2019 12:43 PM)  General Mental Status-Alert. General Appearance-Consistent with stated age. Hydration-Well hydrated. Voice-Normal.  Head and Neck Head-normocephalic, atraumatic with no lesions or palpable masses. Trachea-midline. Thyroid Gland Characteristics - normal size and consistency.  Chest and Lung Exam Chest and lung exam reveals -quiet, even and easy respiratory effort with no use of accessory muscles and on auscultation, normal breath sounds, no adventitious sounds and normal vocal resonance. Inspection Chest Wall - Normal. Back - normal.  Breast Breast - Left-Symmetric, Non Tender, No Biopsy scars, no Dimpling - Left, No Inflammation, No Lumpectomy scars, No Mastectomy scars, No Peau d' Orange. Breast -  Right-Symmetric, Non Tender, No Biopsy scars, no Dimpling - Right, No Inflammation, No Lumpectomy scars, No Mastectomy scars, No Peau d' Orange. Breast Lump-No Palpable Breast Mass.  Cardiovascular Cardiovascular examination reveals -normal heart sounds, regular rate and rhythm with no murmurs and normal pedal pulses bilaterally.  Neurologic Neurologic evaluation reveals -alert and oriented x 3 with no impairment of recent or remote memory. Mental Status-Normal.  Lymphatic Head & Neck  General Head & Neck Lymphatics: Bilateral - Description - Normal. Axillary  General Axillary Region: Bilateral - Description - Normal. Tenderness -  Non Tender. Femoral & Inguinal  Generalized Femoral & Inguinal Lymphatics: Bilateral - Description - Normal. Tenderness - Non Tender.    Assessment & Plan (Shritha Bresee A. Gurtej Noyola MD; 01/21/2019 12:43 PM)  ATYPICAL DUCTAL HYPERPLASIA OF RIGHT BREAST (N60.91) Impression: Recommend lumpectomy was seed localization. Discussed high risk management and that will be revisited after surgery. Recommend genetic testing. She would like to proceed with right breast seed localized lumpectomy Risk of lumpectomy include bleeding, infection, seroma, more surgery, use of seed/wire, wound care, cosmetic deformity and the need for other treatments, death , blood clots, death. Pt agrees to proceed.  Current Plans Pt Education - [facility] Breast Biopsy HCI: discussed with patient and provided information. You are being scheduled for surgery- Our schedulers will call you.  You should hear from our office's scheduling department within 5 working days about the location, date, and time of surgery. We try to make accommodations for patient's preferences in scheduling surgery, but sometimes the OR schedule or the surgeon's schedule prevents [place] from making those accommodations.  If you have not heard from our office [phone]) in 5 working days, call the office  and ask for your surgeon's nurse.  If you have other questions about your diagnosis, plan, or surgery, call the office and ask for your surgeon's nurse.

## 2019-02-22 NOTE — Op Note (Signed)
Preoperative diagnosis: Right breast papilloma  Postop diagnosis: Same  Procedure: Right breast seed localized lumpectomy  Surgeon: Erroll Luna, MD  Anesthesia: LMA with local  EBL: Minimal  Specimen: Right breast tissue with seed and clip verified by Faxitron  Drains: None  Indications for procedure patient presents for right breast seed lumpectomy due to right breast papilloma diagnosed by core biopsy.  The procedure was discussed with the patient as well as the pros and cons of surgery and potential complications and long-term expectations.  Nonoperative management discussed with respect to a papilloma and the potential for upgrade to a malignant diagnosis discussed as well.The procedure has been discussed with the patient. Alternatives to surgery have been discussed with the patient.  Risks of surgery include bleeding,  Infection,  Seroma formation, death,  and the need for further surgery.   The patient understands and wishes to proceed.   Description of procedure: The patient was met in the holding area and questions were answered.  The right breast was examined with the neoprobe and the seed was identified.  She was taken back to the operating room.  She was placed supine upon the OR table.  After induction of general esthesia right breast was prepped and draped in sterile fashion timeout was performed.  The neoprobe was used to identify the seed just into the right nipple.  Curvilinear incision made around the lateral border of the nipple areolar complex.  Local anesthetic infiltrated.  All tissue around the seed and clip were excised with grossly negative margins.  Hemostasis achieved.  Faxitron revealed the seed and clip to be in the specimen.  Wound then closed with 3-0 Vicryl and 4-0 Monocryl.  Dermabond applied.  All final counts were found to be correct.  The patient was awoke extubated taken to recovery in satisfactory condition.

## 2019-02-22 NOTE — Anesthesia Procedure Notes (Signed)
Procedure Name: LMA Insertion Date/Time: 02/22/2019 11:17 AM Performed by: Raenette Rover, CRNA Pre-anesthesia Checklist: Patient identified, Emergency Drugs available, Suction available and Patient being monitored Patient Re-evaluated:Patient Re-evaluated prior to induction Oxygen Delivery Method: Circle system utilized Preoxygenation: Pre-oxygenation with 100% oxygen Induction Type: IV induction LMA: LMA inserted LMA Size: 4.0 Number of attempts: 1 Placement Confirmation: positive ETCO2 and breath sounds checked- equal and bilateral Tube secured with: Tape Dental Injury: Teeth and Oropharynx as per pre-operative assessment

## 2019-02-22 NOTE — Interval H&P Note (Signed)
History and Physical Interval Note:  02/22/2019 11:03 AM  Sabrina [name]  has presented today for surgery, with the diagnosis of RIGHT BREAST ATYPICAL DUCTAL HYPERPLASIA.  The various methods of treatment have been discussed with the patient and family. After consideration of risks, benefits and other options for treatment, the patient has consented to  Procedure(s): RIGHT BREAST LUMPECTOMY WITH RADIOACTIVE SEED LOCALIZATION (Right) as a surgical intervention.  The patient's history has been reviewed, patient examined, no change in status, stable for surgery.  I have reviewed the patient's chart and labs.  Questions were answered to the patient's satisfaction.     [place]

## 2019-02-22 NOTE — Anesthesia Postprocedure Evaluation (Signed)
Anesthesia Post Note  Patient: Sabrina [name]  Procedure(s) Performed: RIGHT BREAST LUMPECTOMY WITH RADIOACTIVE SEED LOCALIZATION (Right Breast)     Patient location during evaluation: PACU Anesthesia Type: General Level of consciousness: awake and alert Pain management: pain level controlled Vital Signs Assessment: post-procedure vital signs reviewed and stable Respiratory status: spontaneous breathing, nonlabored ventilation and respiratory function stable Cardiovascular status: blood pressure returned to baseline and stable Postop Assessment: no apparent nausea or vomiting Anesthetic complications: no    Last Vitals:  Vitals:   02/22/19 1300 02/22/19 1315  BP: 107/74 138/84  Pulse: 61 70  Resp: 14 16  Temp: (!) 36.1 C (!) 36.3 C  SpO2: 100% 100%    Last Pain:  Vitals:   02/22/19 1300  TempSrc:   PainSc: [place]

## 2019-02-22 NOTE — Discharge Instructions (Signed)
[facility] °Office Phone Number [phone] ° °BREAST BIOPSY/ PARTIAL MASTECTOMY: POST OP INSTRUCTIONS ° °Always review your discharge instruction sheet given to you by the facility where your surgery was performed. ° °IF YOU HAVE DISABILITY OR FAMILY LEAVE FORMS, YOU MUST BRING THEM TO THE OFFICE FOR PROCESSING.  DO NOT GIVE THEM TO YOUR DOCTOR. ° °1. A prescription for pain medication may be given to you upon discharge.  Take your pain medication as prescribed, if needed.  If narcotic pain medicine is not needed, then you may take acetaminophen (Tylenol) or ibuprofen (Advil) as needed. °2. Take your usually prescribed medications unless otherwise directed °3. If you need a refill on your pain medication, please contact your pharmacy.  They will contact our office to request authorization.  Prescriptions will not be filled after 5pm or on week-ends. °4. You should eat very light the first 24 hours after surgery, such as soup, crackers, pudding, etc.  Resume your normal diet the day after surgery. °5. Most patients will experience some swelling and bruising in the breast.  Ice packs and a good support bra will help.  Swelling and bruising can take several days to resolve.  °6. It is common to experience some constipation if taking pain medication after surgery.  Increasing fluid intake and taking a stool softener will usually help or prevent this problem from occurring.  A mild laxative (Milk of Magnesia or Miralax) should be taken according to package directions if there are no bowel movements after 48 hours. °7. Unless discharge instructions indicate otherwise, you may remove your bandages 24-48 hours after surgery, and you may shower at that time.  You may have steri-strips (small skin tapes) in place directly over the incision.  These strips should be left on the skin for 7-10 days.  If your surgeon used skin glue on the incision, you may shower in 24 hours.  The glue will flake off over the  next 2-3 weeks.  Any sutures or staples will be removed at the office during your follow-up visit. °8. ACTIVITIES:  You may resume regular daily activities (gradually increasing) beginning the next day.  Wearing a good support bra or sports bra minimizes pain and swelling.  You may have sexual intercourse when it is comfortable. °a. You may drive when you no longer are taking prescription pain medication, you can comfortably wear a seatbelt, and you can safely maneuver your car and apply brakes. °b. RETURN TO WORK:  ______________________________________________________________________________________ °9. You should see your doctor in the office for a follow-up appointment approximately two weeks after your surgery.  Your doctor’s nurse will typically make your follow-up appointment when she calls you with your pathology report.  Expect your pathology report 2-3 business days after your surgery.  You may call to check if you do not hear from us after three days. °10. OTHER INSTRUCTIONS: _______________________________________________________________________________________________ _____________________________________________________________________________________________________________________________________ °_____________________________________________________________________________________________________________________________________ °_____________________________________________________________________________________________________________________________________ ° °WHEN TO CALL YOUR DOCTOR: °1. Fever over 101.0 °2. Nausea and/or vomiting. °3. Extreme swelling or bruising. °4. Continued bleeding from incision. °5. Increased pain, redness, or drainage from the incision. ° °The clinic staff is available to answer your questions during regular business hours.  Please don’t hesitate to call and ask to speak to one of the nurses for clinical concerns.  If you have a medical emergency, go to the nearest  emergency room or call 911.  A surgeon from [facility] is always on call at the hospital. ° °For further questions, please visit [email or web address]  ° ° ° ° °  No Ibuprofen until 6:15 PM on 02/22/2019.    Post Anesthesia Home Care Instructions  Activity: Get plenty of rest for the remainder of the day. A responsible individual must stay with you for 24 hours following the procedure.  For the next 24 hours, DO NOT: -Drive a car -Paediatric nurse -Drink alcoholic beverages -Take any medication unless instructed by your physician -Make any legal decisions or sign important papers.  Meals: Start with liquid foods such as gelatin or soup. Progress to regular foods as tolerated. Avoid greasy, spicy, heavy foods. If nausea and/or vomiting occur, drink only clear liquids until the nausea and/or vomiting subsides. Call your physician if vomiting continues.  Special Instructions/Symptoms: Your throat may feel dry or sore from the anesthesia or the breathing tube placed in your throat during surgery. If this causes discomfort, gargle with warm salt water. The discomfort should disappear within 24 hours.  If you had a scopolamine patch placed behind your ear for the management of post- operative nausea and/or vomiting:  1. The medication in the patch is effective for 72 hours, after which it should be removed.  Wrap patch in a tissue and discard in the trash. Wash hands thoroughly with soap and water. 2. You may remove the patch earlier than 72 hours if you experience unpleasant side effects which may include dry mouth, dizziness or visual disturbances. 3. Avoid touching the patch. Wash your hands with soap and water after contact with the patch.

## 2019-02-22 NOTE — Transfer of Care (Signed)
Immediate Anesthesia Transfer of Care Note  Patient: Sabrina [name]  Procedure(s) Performed: RIGHT BREAST LUMPECTOMY WITH RADIOACTIVE SEED LOCALIZATION (Right Breast)  Patient Location: PACU  Anesthesia Type:General  Level of Consciousness: sedated  Airway & Oxygen Therapy: Patient Spontanous Breathing and Patient connected to face mask oxygen  Post-op Assessment: Report given to RN and Post -op Vital signs reviewed and stable  Post vital signs: Reviewed and stable  Last Vitals:  Vitals Value Taken Time  BP 98/55 02/22/19 1203  Temp    Pulse 71 02/22/19 1206  Resp 11 02/22/19 1206  SpO2 100 % 02/22/19 1206  Vitals shown include unvalidated device data.  Last Pain:  Vitals:   02/22/19 0959  TempSrc: Oral  PainSc: 0-No pain         Complications: No apparent anesthesia complications

## 2019-02-22 NOTE — Anesthesia Preprocedure Evaluation (Signed)
Anesthesia Evaluation  Patient identified by MRN, date of birth, ID band Patient awake    Reviewed: Allergy & Precautions, NPO status , Patient's Chart, lab work & pertinent test results, reviewed documented beta blocker date and time   Airway Mallampati: III   Neck ROM: Full    Dental  (+) Partial Upper, Dental Advisory Given, Teeth Intact   Pulmonary sleep apnea , COPD,    breath sounds clear to auscultation       Cardiovascular hypertension, Pt. on medications  Rhythm:Regular  ECHO 2015 EF 60%, EKG LVH   Neuro/Psych  Headaches, Anxiety Depression Bipolar Disorder    GI/Hepatic Neg liver ROS, GERD  Medicated,  Endo/Other  diabetes, Type 2, Oral Hypoglycemic AgentsMorbid obesity  Renal/GU negative Renal ROS     Musculoskeletal  (+) Arthritis , Osteoarthritis,    Abdominal (+) + obese,   Peds  Hematology   Anesthesia Other Findings   Reproductive/Obstetrics                             Anesthesia Physical  Anesthesia Plan  ASA: III  Anesthesia Plan: General   Post-op Pain Management:    Induction: Intravenous  PONV Risk Score and Plan: 3 and Ondansetron, Dexamethasone, Midazolam and Treatment may vary due to age or medical condition  Airway Management Planned: LMA  Additional Equipment:   Intra-op Plan:   Post-operative Plan: Extubation in OR  Informed Consent: I have reviewed the patients History and Physical, chart, labs and discussed the procedure including the risks, benefits and alternatives for the proposed anesthesia with the patient or authorized representative who has indicated his/her understanding and acceptance.       Plan Discussed with:   Anesthesia Plan Comments:         Anesthesia Quick Evaluation

## 2019-02-25 ENCOUNTER — Encounter (HOSPITAL_BASED_OUTPATIENT_CLINIC_OR_DEPARTMENT_OTHER): Payer: Self-pay | Admitting: Surgery

## 2019-02-28 ENCOUNTER — Ambulatory Visit: Payer: No Typology Code available for payment source

## 2019-02-28 MED FILL — hydrALAZINE HCL 25 MG TABS: 25 | 30 days supply | Qty: 90 | Fill #0

## 2019-03-02 ENCOUNTER — Telehealth: Payer: Self-pay | Admitting: Internal Medicine

## 2019-03-02 NOTE — Telephone Encounter (Signed)
PC placed to pt today.  I left VMM informing pt I was calling to go over results of breast biopsy in the event she has not received results as yet. I do see that she has appt later this mth with medical oncology.  Will try her again later this wk.

## 2019-03-04 ENCOUNTER — Ambulatory Visit: Payer: No Typology Code available for payment source

## 2019-03-06 ENCOUNTER — Telehealth: Payer: Self-pay | Admitting: Internal Medicine

## 2019-03-06 NOTE — Telephone Encounter (Signed)
PC placed to pt this a.m to discuss results of breast biopsy.  LVMM informing of who I am and why I was calling.  Request she call the office this coming week if she wishes to discuss. Otherwise, I encourage her to keep appt with oncologist on 03/22/2019.

## 2019-03-11 ENCOUNTER — Other Ambulatory Visit: Payer: Self-pay | Admitting: Internal Medicine

## 2019-03-11 ENCOUNTER — Other Ambulatory Visit: Payer: Self-pay | Admitting: Family Medicine

## 2019-03-11 DIAGNOSIS — R059 Cough, unspecified: Secondary | ICD-10-CM

## 2019-03-11 DIAGNOSIS — N393 Stress incontinence (female) (male): Secondary | ICD-10-CM

## 2019-03-11 DIAGNOSIS — R05 Cough: Secondary | ICD-10-CM

## 2019-03-11 MED FILL — LOSARTAN POTASSIUM 100 MG T: 100 | 30 days supply | Qty: 30 | Fill #0

## 2019-03-11 MED FILL — OXYBUTYNIN 5 MG TABLET: 5 | 30 days supply | Qty: 60 | Fill #0

## 2019-03-11 MED FILL — ?CETIRIZINE HCL 10 MG TABLE: 10 | 30 days supply | Qty: 30 | Fill #0

## 2019-03-14 ENCOUNTER — Ambulatory Visit: Payer: No Typology Code available for payment source

## 2019-03-14 MED FILL — ?SPIRONOLACTON 50MG TABL: 50 | 30 days supply | Qty: 30 | Fill #0

## 2019-03-15 ENCOUNTER — Encounter: Payer: Self-pay | Admitting: Radiation Oncology

## 2019-03-15 ENCOUNTER — Ambulatory Visit
Admission: RE | Admit: 2019-03-15 | Discharge: 2019-03-15 | Disposition: A | Discharge status: Home / Self Care | Payer: No Typology Code available for payment source | Source: Ambulatory Visit | Attending: Radiation Oncology | Admitting: Radiation Oncology

## 2019-03-15 ENCOUNTER — Other Ambulatory Visit: Payer: Self-pay

## 2019-03-15 DIAGNOSIS — Z809 Family history of malignant neoplasm, unspecified: Secondary | ICD-10-CM

## 2019-03-15 DIAGNOSIS — D0511 Intraductal carcinoma in situ of right breast: Secondary | ICD-10-CM

## 2019-03-15 DIAGNOSIS — Z1379 Encounter for other screening for genetic and chromosomal anomalies: Secondary | ICD-10-CM

## 2019-03-15 DIAGNOSIS — D241 Benign neoplasm of right breast: Secondary | ICD-10-CM
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
## 2019-03-15 NOTE — Patient Instructions (Signed)
External Beam Radiation Therapy, Care After This sheet gives you information about how to care for yourself after your procedure. Your health care provider may also give you more specific instructions. If you have problems or questions, contact your health care provider. What can I expect after the procedure? After the procedure, it is common to have:  Fatigue.  Red, flaking, dry skin in the treated area.  A sunburn-like rash on the skin in the treated area.  Hair loss in the treated area.  Itching in the treated area. Other side effects may occur, depending on which part of the body was exposed to radiation and how much radiation was used. These may include:  Hair loss if the radiation therapy was directed to the head.  Coughing or difficulty swallowing if the radiation therapy was directed to the head, neck, or chest  Nausea, vomiting, or diarrhea if the radiation therapy was directed to the abdomen or pelvis.  Bladder problems, frequent urination, or sexual dysfunction if the radiation therapy was directed to the bladder, kidney, or prostate.  Memory loss and cognitive changes if the radiation therapy was directed to your brain. Although some side effects may show up months to years later, most side effects are usually temporary and get better over time. It can take up to 3-4 weeks for you to regain your energy or for side effects to get better. Follow these instructions at home:  Skin care  Wash your skin with a mild soap as told by your health care provider. Do not scrub or rub your skin. Pat yourself dry.  Use a mild shampoo and be gentle when washing your hair.  Apply gentle lotion or cream to the treated area as told by your health care provider.  Keep the treated area covered when you are outside. Do not expose treated skin to the sun.  Avoid scratching the treated area. General instructions  Do not use a heating pad or a warm cloth to relieve pain in the treated  area.  Take over-the-counter and prescription medicines only as told by your health care provider.  Follow your health care provider's advice on the type and amount of liquids to drink each day.  Try to maintain your weight during treatment. Ask your health care team for tips.  Keep all follow-up visits as told by your health care provider. This is important. The visits are usually scheduled 6 weeks to 6 months after radiation therapy. They are needed to determine if the radiation therapy worked as it was intended to. Contact a health care provider if:  You have pain in the treated area.  The redness worsens in the treated area.  Open skin or blisters develop in the treated area.  You have unexplained weight loss. Get help right away if:  You have a fever.  You have nausea or vomiting that lasts a long time.  You have diarrhea that lasts a long time. Summary  After this procedure, it is common to have fatigue, skin changes and other side effects depending on where the radiation therapy was given.  Although some side effects may show up months to years later, most side effects are usually temporary and get better over time. It can take up to 3-4 weeks for you to regain your energy or for side effects to get better.  Keep all follow-up visits as told by your health care provider. This is important. The visits are usually scheduled 6 weeks to 6 months after radiation therapy.  This information is not intended to replace advice given to you by your health care provider. Make sure you discuss any questions you have with your health care provider. Document Released: 03/22/2013 Document Revised: 02/27/2017 Document Reviewed: 02/20/2016 Elsevier Patient Education  2020 [employer].

## 2019-03-15 NOTE — Progress Notes (Signed)
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 There are also minute detached glandular epithelial fragments with atypia which are worrisome for an abnormal ductal proliferative lesion  Did patient present with symptoms (if so, please note symptoms) or was this found on screening mammography?:  01/04/2019 Small pea sized lump in 10 o'clock position next to nipple found on manual exam; referral to [place] made for diagnostic mammogram and possible ultrasound 01/05/2019 Digital diagnostic bilateral mammogram with CAD and Tomo, and ultrasound of RIGHT breast: Impression: Suspicious right breast mass, possibly an intraductal papilloma.  Past/Anticipated interventions by surgeon, if any: 02/22/2019: Dr. Erroll Luna performed RIGHT breast seed localized lumpectomy - Ductal carcinoma in situ, 1.2 cm, with intermediate nuclear grade and  central necrosis.  - Margins of resection are not involved (Closest margins: Less than 1  mm; anterior, posterior, inferior, medial and lateral).  - Biopsy site changes.  - See oncology table.  Past/Anticipated interventions by medical oncology, if any: Chemotherapy  Scheduled for new patient consult with Dr. Nicholas Lose on 03/22/2019  Lymphedema issues, if any:  None   Pain issues, if any:  Intermittent pain since her biopsy. Patient states the discomfort comes on randomly or while changing positions while sleeping, and ranges anywhere from 3-7.  SAFETY ISSUES:  Prior radiation? No  Pacemaker/ICD? No  Possible current pregnancy? No  Is the patient on methotrexate? No  Current Complaints / other details:   Family history of breast (maternal aunts), prostate (maternal uncle), brain (maternal grandmother), and throat cancer (maternal  uncle). Patient reports she's doing well since her lumpectomy. Denies any swelling or issues with ROM to the arm. She had no other issues or concerns and looked forward to consult with Alean Rinne and Dr. Kyung Rudd  Zola Button, RN 03/15/2019,1:11 PM

## 2019-03-16 ENCOUNTER — Telehealth: Payer: Self-pay | Admitting: Radiation Oncology

## 2019-03-16 ENCOUNTER — Encounter: Payer: Self-pay | Admitting: General Practice

## 2019-03-16 ENCOUNTER — Other Ambulatory Visit: Payer: Self-pay

## 2019-03-16 NOTE — Progress Notes (Signed)
[facility] CSW Progress Notes  Request received from PA Shona Simpson to reach out to patient re social needs including stable housing and full time job.  Called patient and left VM requesting call back.  Will assess needs at that time.  Per Etheleen Sia, her [employer] Medicaid application has been completed and was submitted on 03/07/19.  Nothing further needs to be done at this time re insurance.  Edwyna Shell, LCSW Clinical Social Worker Phone:  [phone] Cell: [phone]

## 2019-03-16 NOTE — Telephone Encounter (Signed)
I spoke with the patient to discuss the conference review this morning and that Dr. Brantley Stage did not recommend any further surgery.  We can now move forward with radiotherapy, Dr. Lisbeth Renshaw discussed that he would pay close attention to her surgical site for her boost as a part of her treatment given the close margins.  The patient is in agreement and is already on the schedule to come in on Tuesday next week for simulation.

## 2019-03-17 ENCOUNTER — Telehealth: Payer: Self-pay | Admitting: General Practice

## 2019-03-17 ENCOUNTER — Encounter: Payer: Self-pay | Admitting: General Practice

## 2019-03-17 ENCOUNTER — Ambulatory Visit: Payer: No Typology Code available for payment source

## 2019-03-17 LAB — SURGICAL PATHOLOGY

## 2019-03-17 NOTE — Telephone Encounter (Signed)
[facility] CSW Progress Notes  Second call to patient to assess for needs/resources.  No answer, left VM requesting call back.  Edwyna Shell, LCSW Clinical Social Worker Phone:  [phone] Cell:  [phone]

## 2019-03-17 NOTE — Progress Notes (Signed)
[facility] CSW Progress Notes  Called patient to assess for needs/resources.  Has multiple concerns including dental issues, consumer debt, car repair needs, wanting to move from current apartment into house.  Is working with credit counseling program via her credit union, frustrated with slipping more into debt despite her best efforts to better her situation.  Discussed prioritizing things, especially as she is going to be undergoing treatment for cancer currently.  Focus on regaining her health.  Noted that patient's application for [place] Medicaid has been submitted - Medicaid may be available to pay for her needed dental work.  Advised that once her treatment plan is in place at [facility], she can apply for [employer].  Rad Conservation officer, historic buildings asked to contact her re this need.  CSW [place] asked to provide applications for [employer] and [employer] to patient when she is at [facility] for [employer] on Tuesday.  Patient also reports that she is current w [facility] for mental health support and that this is very important for her to maintain.  CSWs will assist her with filing these applications as she brings in required documentation.  We are not able to provide additional help with her housing desires - patient is not homeless and has a stable apartment.  She is currently linked with [employer] and enrolled in a program of theirs to promote housing stability.  No other programs/services are available for her at this time.  Advised to focus on immediate health concerns and prioritize these needs.  Edwyna Shell, LCSW Clinical Social Worker Phone:  [phone] Cell:  [phone]
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
## 2019-03-22 ENCOUNTER — Other Ambulatory Visit: Payer: Self-pay

## 2019-03-22 ENCOUNTER — Encounter: Payer: Self-pay | Admitting: *Deleted

## 2019-03-22 ENCOUNTER — Inpatient Hospital Stay: Payer: Medicaid Other | Attending: Genetic Counselor | Admitting: Hematology and Oncology

## 2019-03-22 ENCOUNTER — Ambulatory Visit
Admission: RE | Admit: 2019-03-22 | Discharge: 2019-03-22 | Disposition: A | Discharge status: Home / Self Care | Payer: Medicaid Other | Source: Ambulatory Visit | Attending: Radiation Oncology | Admitting: Radiation Oncology

## 2019-03-22 DIAGNOSIS — Z7984 Long term (current) use of oral hypoglycemic drugs: Secondary | ICD-10-CM | POA: Insufficient documentation

## 2019-03-22 DIAGNOSIS — D0511 Intraductal carcinoma in situ of right breast: Secondary | ICD-10-CM | POA: Insufficient documentation

## 2019-03-22 DIAGNOSIS — E119 Type 2 diabetes mellitus without complications: Secondary | ICD-10-CM | POA: Diagnosis not present

## 2019-03-22 DIAGNOSIS — E669 Obesity, unspecified: Secondary | ICD-10-CM | POA: Insufficient documentation

## 2019-03-22 DIAGNOSIS — Z79899 Other long term (current) drug therapy: Secondary | ICD-10-CM | POA: Diagnosis not present

## 2019-03-22 DIAGNOSIS — E785 Hyperlipidemia, unspecified: Secondary | ICD-10-CM | POA: Insufficient documentation

## 2019-03-22 DIAGNOSIS — Z510 Encounter for antineoplastic radiation therapy: Secondary | ICD-10-CM | POA: Insufficient documentation

## 2019-03-22 DIAGNOSIS — Z8042 Family history of malignant neoplasm of prostate: Secondary | ICD-10-CM | POA: Diagnosis not present

## 2019-03-22 DIAGNOSIS — K219 Gastro-esophageal reflux disease without esophagitis: Secondary | ICD-10-CM | POA: Insufficient documentation

## 2019-03-22 DIAGNOSIS — M129 Arthropathy, unspecified: Secondary | ICD-10-CM | POA: Insufficient documentation

## 2019-03-22 DIAGNOSIS — Z803 Family history of malignant neoplasm of breast: Secondary | ICD-10-CM | POA: Diagnosis not present

## 2019-03-22 DIAGNOSIS — F418 Other specified anxiety disorders: Secondary | ICD-10-CM | POA: Insufficient documentation

## 2019-03-22 DIAGNOSIS — R079 Chest pain, unspecified: Secondary | ICD-10-CM | POA: Diagnosis not present

## 2019-03-22 DIAGNOSIS — Z170 Estrogen receptor positive status [ER+]: Secondary | ICD-10-CM | POA: Insufficient documentation

## 2019-03-22 DIAGNOSIS — M069 Rheumatoid arthritis, unspecified: Secondary | ICD-10-CM | POA: Insufficient documentation

## 2019-03-22 DIAGNOSIS — I10 Essential (primary) hypertension: Secondary | ICD-10-CM | POA: Diagnosis not present

## 2019-03-22 DIAGNOSIS — M109 Gout, unspecified: Secondary | ICD-10-CM | POA: Insufficient documentation

## 2019-03-22 NOTE — Progress Notes (Signed)
[facility] Clinical Social Work  Clinical Social Worker met with patient in exam room to offer support and follow up on financial concerns.  CSW and patient reviewed applications for [employer] [facility] [employer].  Patient plans to collect required information and return with application on 04/04/19 when she is here for treatment.  CSW provided information on the support team and support services.  CSW also provided patient with contact information and encouraged her to call with questions or concerns.     , MSW, LCSW, OSW-C Clinical Social Worker [facility] [phone]         

## 2019-03-22 NOTE — Assessment & Plan Note (Addendum)
01/05/2019: Mammogram revealed suspicious right breast mass possibly intraductal papilloma measuring 1.1 cm Right breast biopsy 9 o'clock position: 01/06/2019: Atypical epithelial fragments  Right lumpectomy 02/22/2019: DCIS 1.2 cm with intermediate nuclear grade with central necrosis, margins negative, ER 95%, PR 70% Tis NX stage 0  Pathology review: I discussed with the patient the difference between DCIS and invasive breast cancer. It is considered a precancerous lesion. DCIS is classified as a 0. It is generally detected through mammograms as calcifications. We discussed the significance of grades and its impact on prognosis. We also discussed the importance of ER and PR receptors and their implications to adjuvant treatment options. Prognosis of DCIS dependence on grade, comedo necrosis.   Recommendation: 1. Adjuvant radiation therapy 2. Followed by antiestrogen therapy with tamoxifen 5 years  Tamoxifen counseling: We discussed the risks and benefits of tamoxifen. These include but not limited to insomnia, hot flashes, mood changes, vaginal dryness, and weight gain. Although rare, serious side effects including endometrial cancer, risk of blood clots were also discussed. We strongly believe that the benefits far outweigh the risks. Patient understands these risks and consented to starting treatment. Planned treatment duration is 5 years.  Return to clinic after radiation to start antiestrogen therapy.

## 2019-03-23 NOTE — Progress Notes (Signed)
  Radiation Oncology         [phone]) [phone] ________________________________  Name: Sabrina [name] MRN: [id number]  Date: 03/22/2019  DOB: 04-28-1960  Optical Surface Tracking Plan:  Since intensity modulated radiotherapy (IMRT) and 3D conformal radiation treatment methods are predicated on accurate and precise positioning for treatment, intrafraction motion monitoring is medically necessary to ensure accurate and safe treatment delivery.  The ability to quantify intrafraction motion without excessive ionizing radiation dose can only be performed with optical surface tracking. Accordingly, surface imaging offers the opportunity to obtain 3D measurements of patient position throughout IMRT and 3D treatments without excessive radiation exposure.  I am ordering optical surface tracking for this patient's upcoming course of radiotherapy. ________________________________  Kyung Rudd, MD 03/23/2019 9:00 AM    Reference:   Ursula Alert, J, et al. Surface imaging-based analysis of intrafraction motion for breast radiotherapy patients.Journal of [place], n. 6, nov. 2014. ISSN [id number].   Available at: <http://www.jacmp.org/index.php/jacmp/article/view/4957>.

## 2019-03-23 NOTE — Progress Notes (Signed)
  Radiation Oncology         [phone]) [phone] ________________________________  Name: Sabrina [name] MRN: [id number]  Date: 03/22/2019  DOB: [birth year]  DIAGNOSIS:     ICD-10-CM   1. Ductal carcinoma in situ (DCIS) of right breast  D05.11      SIMULATION AND TREATMENT PLANNING NOTE  The patient presented for simulation prior to beginning her course of radiation treatment for her diagnosis of right-sided breast cancer. The patient was placed in a supine position on a breast board. A customized vac-lock bag was constructed and this complex treatment device will be used on a daily basis during her treatment. In this fashion, a CT scan was obtained through the chest area and an isocenter was placed near the chest wall within the breast.  The patient will be planned to receive a course of radiation initially to a dose of 50 Gy. This will consist of a whole breast radiotherapy technique. To accomplish this, 2 customized blocks have been designed which will correspond to medial and lateral whole breast tangent fields. This treatment will be accomplished at 2 Gy per fraction. A forward planning technique will also be evaluated to determine if this approach improves the plan. It is anticipated that the patient will then receive a 16 Gy boost to the seroma cavity which has been contoured. This will be accomplished at 2 Gy per fraction.   This initial treatment will consist of a 3-D conformal technique. The seroma has been contoured as the primary target structure. Additionally, dose volume histograms of both this target as well as the lungs and heart will also be evaluated. Such an approach is necessary to ensure that the target area is adequately covered while the nearby critical  normal structures are adequately spared.  Plan:  The final anticipated total dose therefore will correspond to 66 Gy.    _______________________________   Jodelle Gross, MD, PhD

## 2019-03-28 MED FILL — ?METFORMIN HCL 500MG TABLET: 500 | 30 days supply | Qty: 30 | Fill #2

## 2019-03-29 DIAGNOSIS — Z510 Encounter for antineoplastic radiation therapy: Secondary | ICD-10-CM | POA: Diagnosis not present

## 2019-03-30 DIAGNOSIS — Z510 Encounter for antineoplastic radiation therapy: Secondary | ICD-10-CM | POA: Diagnosis not present

## 2019-04-04 ENCOUNTER — Ambulatory Visit
Admission: RE | Admit: 2019-04-04 | Discharge: 2019-04-04 | Disposition: A | Discharge status: Home / Self Care | Payer: Medicaid Other | Source: Ambulatory Visit | Attending: Radiation Oncology | Admitting: Radiation Oncology

## 2019-04-04 ENCOUNTER — Other Ambulatory Visit: Payer: Self-pay

## 2019-04-04 ENCOUNTER — Ambulatory Visit: Payer: Medicaid Other | Admitting: Radiation Oncology

## 2019-04-04 DIAGNOSIS — Z510 Encounter for antineoplastic radiation therapy: Secondary | ICD-10-CM | POA: Insufficient documentation

## 2019-04-04 DIAGNOSIS — D0511 Intraductal carcinoma in situ of right breast: Secondary | ICD-10-CM | POA: Insufficient documentation

## 2019-04-05 ENCOUNTER — Ambulatory Visit
Admission: RE | Admit: 2019-04-05 | Discharge: 2019-04-05 | Disposition: A | Discharge status: Home / Self Care | Payer: Medicaid Other | Source: Ambulatory Visit | Attending: Radiation Oncology | Admitting: Radiation Oncology

## 2019-04-05 DIAGNOSIS — Z510 Encounter for antineoplastic radiation therapy: Secondary | ICD-10-CM | POA: Diagnosis not present

## 2019-04-06 ENCOUNTER — Ambulatory Visit
Admission: RE | Admit: 2019-04-06 | Discharge: 2019-04-06 | Disposition: A | Discharge status: Home / Self Care | Payer: Medicaid Other | Source: Ambulatory Visit | Attending: Radiation Oncology | Admitting: Radiation Oncology

## 2019-04-06 ENCOUNTER — Other Ambulatory Visit: Payer: Self-pay

## 2019-04-06 DIAGNOSIS — Z510 Encounter for antineoplastic radiation therapy: Secondary | ICD-10-CM | POA: Diagnosis not present

## 2019-04-07 ENCOUNTER — Ambulatory Visit
Admission: RE | Admit: 2019-04-07 | Discharge: 2019-04-07 | Disposition: A | Discharge status: Home / Self Care | Payer: Medicaid Other | Source: Ambulatory Visit | Attending: Radiation Oncology | Admitting: Radiation Oncology

## 2019-04-07 ENCOUNTER — Other Ambulatory Visit: Payer: Self-pay

## 2019-04-07 DIAGNOSIS — Z510 Encounter for antineoplastic radiation therapy: Secondary | ICD-10-CM | POA: Diagnosis not present

## 2019-04-08 ENCOUNTER — Other Ambulatory Visit: Payer: Self-pay

## 2019-04-08 ENCOUNTER — Ambulatory Visit
Admission: RE | Admit: 2019-04-08 | Discharge: 2019-04-08 | Disposition: A | Discharge status: Home / Self Care | Payer: Medicaid Other | Source: Ambulatory Visit | Attending: Radiation Oncology | Admitting: Radiation Oncology

## 2019-04-08 DIAGNOSIS — Z510 Encounter for antineoplastic radiation therapy: Secondary | ICD-10-CM | POA: Diagnosis not present

## 2019-04-08 DIAGNOSIS — D0511 Intraductal carcinoma in situ of right breast: Secondary | ICD-10-CM

## 2019-04-08 MED ORDER — SONAFINE EX EMUL
1.0000 "application " | Freq: Two times a day (BID) | CUTANEOUS | Status: DC
  Administered 2019-04-08: 1 via TOPICAL

## 2019-04-08 MED ORDER — ALRA NON-METALLIC DEODORANT (RAD-ONC)
1.0000 "application " | Freq: Once | TOPICAL | Status: AC
  Administered 2019-04-08: 1 via TOPICAL

## 2019-04-11 ENCOUNTER — Ambulatory Visit
Admission: RE | Admit: 2019-04-11 | Discharge: 2019-04-11 | Disposition: A | Discharge status: Home / Self Care | Payer: Medicaid Other | Source: Ambulatory Visit | Attending: Radiation Oncology | Admitting: Radiation Oncology

## 2019-04-11 ENCOUNTER — Other Ambulatory Visit: Payer: Self-pay

## 2019-04-11 DIAGNOSIS — Z510 Encounter for antineoplastic radiation therapy: Secondary | ICD-10-CM | POA: Diagnosis not present

## 2019-04-12 ENCOUNTER — Ambulatory Visit
Admission: RE | Admit: 2019-04-12 | Discharge: 2019-04-12 | Disposition: A | Discharge status: Home / Self Care | Payer: Medicaid Other | Source: Ambulatory Visit | Attending: Radiation Oncology | Admitting: Radiation Oncology

## 2019-04-12 DIAGNOSIS — Z510 Encounter for antineoplastic radiation therapy: Secondary | ICD-10-CM | POA: Diagnosis not present

## 2019-04-13 ENCOUNTER — Ambulatory Visit
Admission: RE | Admit: 2019-04-13 | Discharge: 2019-04-13 | Disposition: A | Discharge status: Home / Self Care | Payer: Medicaid Other | Source: Ambulatory Visit | Attending: Radiation Oncology | Admitting: Radiation Oncology

## 2019-04-13 ENCOUNTER — Other Ambulatory Visit: Payer: Self-pay

## 2019-04-13 DIAGNOSIS — Z510 Encounter for antineoplastic radiation therapy: Secondary | ICD-10-CM | POA: Diagnosis not present

## 2019-04-14 ENCOUNTER — Other Ambulatory Visit: Payer: Self-pay

## 2019-04-14 ENCOUNTER — Ambulatory Visit
Admission: RE | Admit: 2019-04-14 | Discharge: 2019-04-14 | Disposition: A | Discharge status: Home / Self Care | Payer: Medicaid Other | Source: Ambulatory Visit | Attending: Radiation Oncology | Admitting: Radiation Oncology

## 2019-04-14 DIAGNOSIS — Z510 Encounter for antineoplastic radiation therapy: Secondary | ICD-10-CM | POA: Diagnosis not present

## 2019-04-15 ENCOUNTER — Ambulatory Visit
Admission: RE | Admit: 2019-04-15 | Discharge: 2019-04-15 | Disposition: A | Discharge status: Home / Self Care | Payer: Medicaid Other | Source: Ambulatory Visit | Attending: Radiation Oncology | Admitting: Radiation Oncology

## 2019-04-15 ENCOUNTER — Other Ambulatory Visit: Payer: Self-pay

## 2019-04-15 DIAGNOSIS — Z510 Encounter for antineoplastic radiation therapy: Secondary | ICD-10-CM | POA: Diagnosis not present

## 2019-04-17 ENCOUNTER — Encounter: Payer: Self-pay | Admitting: Internal Medicine

## 2019-04-18 ENCOUNTER — Other Ambulatory Visit: Payer: Self-pay

## 2019-04-18 ENCOUNTER — Ambulatory Visit
Admission: RE | Admit: 2019-04-18 | Discharge: 2019-04-18 | Disposition: A | Discharge status: Home / Self Care | Payer: Medicaid Other | Source: Ambulatory Visit | Attending: Radiation Oncology | Admitting: Radiation Oncology

## 2019-04-18 DIAGNOSIS — Z510 Encounter for antineoplastic radiation therapy: Secondary | ICD-10-CM | POA: Diagnosis not present

## 2019-04-19 ENCOUNTER — Other Ambulatory Visit: Payer: Self-pay

## 2019-04-19 ENCOUNTER — Ambulatory Visit
Admission: RE | Admit: 2019-04-19 | Discharge: 2019-04-19 | Disposition: A | Discharge status: Home / Self Care | Payer: Medicaid Other | Source: Ambulatory Visit | Attending: Radiation Oncology | Admitting: Radiation Oncology

## 2019-04-19 DIAGNOSIS — Z510 Encounter for antineoplastic radiation therapy: Secondary | ICD-10-CM | POA: Diagnosis not present

## 2019-04-19 MED FILL — OXYBUTYNIN 5 MG TABLET: 5 | 30 days supply | Qty: 60 | Fill #1

## 2019-04-19 MED FILL — ?CETIRIZINE HCL 10 MG TABLE: 10 | 30 days supply | Qty: 30 | Fill #1

## 2019-04-19 MED FILL — ?SPIRONOLACTON 50MG TABLET: 50 | 30 days supply | Qty: 30 | Fill #1

## 2019-04-20 ENCOUNTER — Ambulatory Visit
Admission: RE | Admit: 2019-04-20 | Discharge: 2019-04-20 | Disposition: A | Discharge status: Home / Self Care | Payer: Medicaid Other | Source: Ambulatory Visit | Attending: Radiation Oncology | Admitting: Radiation Oncology

## 2019-04-20 ENCOUNTER — Other Ambulatory Visit: Payer: Self-pay

## 2019-04-20 DIAGNOSIS — Z510 Encounter for antineoplastic radiation therapy: Secondary | ICD-10-CM | POA: Diagnosis not present

## 2019-04-21 ENCOUNTER — Other Ambulatory Visit: Payer: Self-pay

## 2019-04-21 ENCOUNTER — Ambulatory Visit
Admission: RE | Admit: 2019-04-21 | Discharge: 2019-04-21 | Disposition: A | Discharge status: Home / Self Care | Payer: Medicaid Other | Source: Ambulatory Visit | Attending: Radiation Oncology | Admitting: Radiation Oncology

## 2019-04-21 DIAGNOSIS — Z510 Encounter for antineoplastic radiation therapy: Secondary | ICD-10-CM | POA: Diagnosis not present

## 2019-04-22 ENCOUNTER — Ambulatory Visit
Admission: RE | Admit: 2019-04-22 | Discharge: 2019-04-22 | Disposition: A | Discharge status: Home / Self Care | Payer: Medicaid Other | Source: Ambulatory Visit | Attending: Radiation Oncology | Admitting: Radiation Oncology

## 2019-04-22 ENCOUNTER — Other Ambulatory Visit: Payer: Self-pay

## 2019-04-22 DIAGNOSIS — Z510 Encounter for antineoplastic radiation therapy: Secondary | ICD-10-CM | POA: Diagnosis not present

## 2019-04-22 DIAGNOSIS — N6311 Unspecified lump in the right breast, upper outer quadrant: Secondary | ICD-10-CM

## 2019-04-22 MED ORDER — SONAFINE EX EMUL
1.0000 "application " | Freq: Two times a day (BID) | CUTANEOUS | Status: DC
  Administered 2019-04-22: 1 via TOPICAL

## 2019-04-25 ENCOUNTER — Other Ambulatory Visit: Payer: Self-pay

## 2019-04-25 ENCOUNTER — Ambulatory Visit
Admission: RE | Admit: 2019-04-25 | Discharge: 2019-04-25 | Disposition: A | Discharge status: Home / Self Care | Payer: Medicaid Other | Source: Ambulatory Visit | Attending: Radiation Oncology | Admitting: Radiation Oncology

## 2019-04-25 DIAGNOSIS — Z510 Encounter for antineoplastic radiation therapy: Secondary | ICD-10-CM | POA: Diagnosis not present

## 2019-04-26 ENCOUNTER — Ambulatory Visit
Admission: RE | Admit: 2019-04-26 | Discharge: 2019-04-26 | Disposition: A | Discharge status: Home / Self Care | Payer: Medicaid Other | Source: Ambulatory Visit | Attending: Radiation Oncology | Admitting: Radiation Oncology

## 2019-04-26 ENCOUNTER — Other Ambulatory Visit: Payer: Self-pay

## 2019-04-26 DIAGNOSIS — Z510 Encounter for antineoplastic radiation therapy: Secondary | ICD-10-CM | POA: Diagnosis not present

## 2019-04-27 ENCOUNTER — Other Ambulatory Visit: Payer: Self-pay

## 2019-04-27 ENCOUNTER — Ambulatory Visit
Admission: RE | Admit: 2019-04-27 | Discharge: 2019-04-27 | Disposition: A | Discharge status: Home / Self Care | Payer: Medicaid Other | Source: Ambulatory Visit | Attending: Radiation Oncology | Admitting: Radiation Oncology

## 2019-04-27 DIAGNOSIS — Z510 Encounter for antineoplastic radiation therapy: Secondary | ICD-10-CM | POA: Diagnosis not present

## 2019-04-27 MED FILL — hydrALAZINE HCL 25 MG TABS: 25 | 30 days supply | Qty: 90 | Fill #1

## 2019-04-28 ENCOUNTER — Ambulatory Visit: Payer: Self-pay | Attending: Internal Medicine

## 2019-04-28 ENCOUNTER — Other Ambulatory Visit: Payer: Self-pay

## 2019-04-28 ENCOUNTER — Ambulatory Visit
Admission: RE | Admit: 2019-04-28 | Discharge: 2019-04-28 | Disposition: A | Discharge status: Home / Self Care | Payer: Medicaid Other | Source: Ambulatory Visit | Attending: Radiation Oncology | Admitting: Radiation Oncology

## 2019-04-28 ENCOUNTER — Telehealth: Payer: Self-pay

## 2019-04-28 DIAGNOSIS — Z510 Encounter for antineoplastic radiation therapy: Secondary | ICD-10-CM | POA: Diagnosis not present

## 2019-04-28 NOTE — Telephone Encounter (Signed)
Meet with patient when she was in the clinic today to meet with Clifton James, stated not being able to obtain a radiation bra at this time due to monetary situation. Patient c/o pain after having radiation therapy. Stated she was informed of possible pain after every radiation session, but she does not like to take pain pills. Stated not taking her prescribed pain medication  To aleviatete pain.  Normal temperature was recorded 98.2 F  no drainage noted, no discharge, or significant lesions, skin intact, breast warm to touch. Denies any fever, chills, SOB, abdominal pain. No acute distress.  Stated having an appt tomorrow with radiation Md.  PCP notified of patient aches. Unavailable   app for today.  Instructed patient to comply with after care instructions provided at the cancer center and the importance of it.  Instructed pt if pain worsen, or starting experience fevers, body chills, SOB, or abdominal pain please refer to UC/ Ed. Verbalized understanding

## 2019-04-29 ENCOUNTER — Ambulatory Visit
Admission: RE | Admit: 2019-04-29 | Discharge: 2019-04-29 | Disposition: A | Discharge status: Home / Self Care | Payer: Medicaid Other | Source: Ambulatory Visit | Attending: Radiation Oncology | Admitting: Radiation Oncology

## 2019-04-29 ENCOUNTER — Ambulatory Visit: Payer: Medicaid Other | Admitting: Radiation Oncology

## 2019-04-29 ENCOUNTER — Other Ambulatory Visit: Payer: Self-pay

## 2019-04-29 ENCOUNTER — Telehealth: Payer: Self-pay | Admitting: General Practice

## 2019-04-29 DIAGNOSIS — Z510 Encounter for antineoplastic radiation therapy: Secondary | ICD-10-CM | POA: Diagnosis not present

## 2019-04-29 NOTE — Telephone Encounter (Signed)
[facility] CSW Progress Notes  Request received to help patient obtain a radiation bra - she is uninsured at present and has financial strain.  Referred to [facility] - although based in [place], they have telehealth fittings and will mail out appropriate free bra to breast cancer patients in need.  CSW spoke w patient, provided information on how to access this resource ([facility] or 934-437-4920)  Edwyna Shell, [place] Worker Phone:  [phone] Cell:  [phone]

## 2019-05-02 ENCOUNTER — Other Ambulatory Visit: Payer: Self-pay

## 2019-05-02 ENCOUNTER — Ambulatory Visit
Admission: RE | Admit: 2019-05-02 | Discharge: 2019-05-02 | Disposition: A | Discharge status: Home / Self Care | Payer: Medicaid Other | Source: Ambulatory Visit | Attending: Radiation Oncology | Admitting: Radiation Oncology

## 2019-05-02 DIAGNOSIS — Z510 Encounter for antineoplastic radiation therapy: Secondary | ICD-10-CM | POA: Insufficient documentation

## 2019-05-02 DIAGNOSIS — D0511 Intraductal carcinoma in situ of right breast: Secondary | ICD-10-CM | POA: Diagnosis not present

## 2019-05-02 DIAGNOSIS — Z170 Estrogen receptor positive status [ER+]: Secondary | ICD-10-CM | POA: Insufficient documentation

## 2019-05-03 ENCOUNTER — Ambulatory Visit
Admission: RE | Admit: 2019-05-03 | Discharge: 2019-05-03 | Disposition: A | Discharge status: Home / Self Care | Payer: Medicaid Other | Source: Ambulatory Visit | Attending: Radiation Oncology | Admitting: Radiation Oncology

## 2019-05-03 ENCOUNTER — Other Ambulatory Visit: Payer: Self-pay

## 2019-05-03 DIAGNOSIS — Z510 Encounter for antineoplastic radiation therapy: Secondary | ICD-10-CM | POA: Diagnosis not present

## 2019-05-03 MED FILL — ?METFORMIN HCL 500MG TABLET: 500 | 30 days supply | Qty: 30 | Fill #3

## 2019-05-03 MED FILL — LOSARTAN POTASSIUM 100 MG T: 100 | 30 days supply | Qty: 30 | Fill #1

## 2019-05-04 ENCOUNTER — Ambulatory Visit
Admission: RE | Admit: 2019-05-04 | Discharge: 2019-05-04 | Disposition: A | Discharge status: Home / Self Care | Payer: Medicaid Other | Source: Ambulatory Visit | Attending: Radiation Oncology | Admitting: Radiation Oncology

## 2019-05-04 ENCOUNTER — Other Ambulatory Visit: Payer: Self-pay

## 2019-05-04 DIAGNOSIS — Z510 Encounter for antineoplastic radiation therapy: Secondary | ICD-10-CM | POA: Diagnosis not present

## 2019-05-05 ENCOUNTER — Ambulatory Visit: Payer: Medicaid Other

## 2019-05-06 ENCOUNTER — Ambulatory Visit
Admission: RE | Admit: 2019-05-06 | Discharge: 2019-05-06 | Disposition: A | Discharge status: Home / Self Care | Payer: Medicaid Other | Source: Ambulatory Visit | Attending: Radiation Oncology | Admitting: Radiation Oncology

## 2019-05-06 ENCOUNTER — Other Ambulatory Visit: Payer: Self-pay

## 2019-05-06 DIAGNOSIS — Z510 Encounter for antineoplastic radiation therapy: Secondary | ICD-10-CM | POA: Diagnosis not present

## 2019-05-09 ENCOUNTER — Other Ambulatory Visit: Payer: Self-pay

## 2019-05-09 ENCOUNTER — Ambulatory Visit: Payer: Medicaid Other

## 2019-05-09 ENCOUNTER — Ambulatory Visit
Admission: RE | Admit: 2019-05-09 | Discharge: 2019-05-09 | Disposition: A | Discharge status: Home / Self Care | Payer: Medicaid Other | Source: Ambulatory Visit | Attending: Radiation Oncology | Admitting: Radiation Oncology

## 2019-05-09 DIAGNOSIS — Z510 Encounter for antineoplastic radiation therapy: Secondary | ICD-10-CM | POA: Diagnosis not present

## 2019-05-10 ENCOUNTER — Other Ambulatory Visit: Payer: Self-pay

## 2019-05-10 ENCOUNTER — Ambulatory Visit: Payer: No Typology Code available for payment source | Admitting: Internal Medicine

## 2019-05-10 ENCOUNTER — Ambulatory Visit: Payer: Medicaid Other

## 2019-05-10 ENCOUNTER — Ambulatory Visit
Admission: RE | Admit: 2019-05-10 | Discharge: 2019-05-10 | Disposition: A | Discharge status: Home / Self Care | Payer: Medicaid Other | Source: Ambulatory Visit | Attending: Radiation Oncology | Admitting: Radiation Oncology

## 2019-05-10 DIAGNOSIS — Z510 Encounter for antineoplastic radiation therapy: Secondary | ICD-10-CM | POA: Diagnosis not present

## 2019-05-11 ENCOUNTER — Other Ambulatory Visit: Payer: Self-pay

## 2019-05-11 ENCOUNTER — Ambulatory Visit
Admission: RE | Admit: 2019-05-11 | Discharge: 2019-05-11 | Disposition: A | Discharge status: Home / Self Care | Payer: Medicaid Other | Source: Ambulatory Visit | Attending: Radiation Oncology | Admitting: Radiation Oncology

## 2019-05-11 DIAGNOSIS — Z510 Encounter for antineoplastic radiation therapy: Secondary | ICD-10-CM | POA: Diagnosis not present

## 2019-05-12 ENCOUNTER — Ambulatory Visit
Admission: RE | Admit: 2019-05-12 | Discharge: 2019-05-12 | Disposition: A | Discharge status: Home / Self Care | Payer: Medicaid Other | Source: Ambulatory Visit | Attending: Radiation Oncology | Admitting: Radiation Oncology

## 2019-05-12 ENCOUNTER — Other Ambulatory Visit: Payer: Self-pay

## 2019-05-12 DIAGNOSIS — Z510 Encounter for antineoplastic radiation therapy: Secondary | ICD-10-CM | POA: Diagnosis not present

## 2019-05-13 ENCOUNTER — Other Ambulatory Visit: Payer: Self-pay

## 2019-05-13 ENCOUNTER — Ambulatory Visit
Admission: RE | Admit: 2019-05-13 | Discharge: 2019-05-13 | Disposition: A | Discharge status: Home / Self Care | Payer: Medicaid Other | Source: Ambulatory Visit | Attending: Radiation Oncology | Admitting: Radiation Oncology

## 2019-05-13 DIAGNOSIS — Z510 Encounter for antineoplastic radiation therapy: Secondary | ICD-10-CM | POA: Diagnosis not present

## 2019-05-16 ENCOUNTER — Ambulatory Visit: Payer: Medicaid Other | Attending: Internal Medicine | Admitting: Internal Medicine

## 2019-05-16 ENCOUNTER — Other Ambulatory Visit: Payer: Self-pay

## 2019-05-16 ENCOUNTER — Ambulatory Visit
Admission: RE | Admit: 2019-05-16 | Discharge: 2019-05-16 | Disposition: A | Discharge status: Home / Self Care | Payer: Medicaid Other | Source: Ambulatory Visit | Attending: Radiation Oncology | Admitting: Radiation Oncology

## 2019-05-16 ENCOUNTER — Encounter: Payer: Self-pay | Admitting: Internal Medicine

## 2019-05-16 VITALS — BP 119/78 | HR 85 | Ht 64.0 in | Wt 272.0 lb

## 2019-05-16 DIAGNOSIS — I10 Essential (primary) hypertension: Secondary | ICD-10-CM

## 2019-05-16 DIAGNOSIS — K029 Dental caries, unspecified: Secondary | ICD-10-CM

## 2019-05-16 DIAGNOSIS — Z853 Personal history of malignant neoplasm of breast: Secondary | ICD-10-CM

## 2019-05-16 DIAGNOSIS — F3163 Bipolar disorder, current episode mixed, severe, without psychotic features: Secondary | ICD-10-CM | POA: Diagnosis not present

## 2019-05-16 DIAGNOSIS — H538 Other visual disturbances: Secondary | ICD-10-CM | POA: Diagnosis not present

## 2019-05-16 DIAGNOSIS — E1169 Type 2 diabetes mellitus with other specified complication: Secondary | ICD-10-CM

## 2019-05-16 DIAGNOSIS — Z510 Encounter for antineoplastic radiation therapy: Secondary | ICD-10-CM | POA: Diagnosis not present

## 2019-05-16 DIAGNOSIS — Z6841 Body Mass Index (BMI) 40.0 and over, adult: Secondary | ICD-10-CM

## 2019-05-16 DIAGNOSIS — D0511 Intraductal carcinoma in situ of right breast: Secondary | ICD-10-CM

## 2019-05-16 LAB — POCT GLYCOSYLATED HEMOGLOBIN (HGB A1C): HbA1c, POC (controlled diabetic range): 5.5 % (ref 0.0–7.0)

## 2019-05-16 LAB — GLUCOSE, POCT (MANUAL RESULT ENTRY): POC Glucose: 118 mg/dl — AB (ref 70–99)

## 2019-05-16 NOTE — Patient Instructions (Signed)
Diabetes Mellitus and Exercise Exercising regularly is important for your overall health, especially when you have diabetes (diabetes mellitus). Exercising is not only about losing weight. It has many other health benefits, such as increasing muscle strength and bone density and reducing body fat and stress. This leads to improved fitness, flexibility, and endurance, all of which result in better overall health. Exercise has additional benefits for people with diabetes, including:  Reducing appetite.  Helping to lower and control blood glucose.  Lowering blood pressure.  Helping to control amounts of fatty substances (lipids) in the blood, such as cholesterol and triglycerides.  Helping the body to respond better to insulin (improving insulin sensitivity).  Reducing how much insulin the body needs.  Decreasing the risk for heart disease by: ? Lowering cholesterol and triglyceride levels. ? Increasing the levels of good cholesterol. ? Lowering blood glucose levels. What is my activity plan? Your health care provider or certified diabetes educator can help you make a plan for the type and frequency of exercise (activity plan) that works for you. Make sure that you:  Do at least 150 minutes of moderate-intensity or vigorous-intensity exercise each week. This could be brisk walking, biking, or water aerobics. ? Do stretching and strength exercises, such as yoga or weightlifting, at least 2 times a week. ? Spread out your activity over at least 3 days of the week.  Get some form of physical activity every day. ? Do not go more than 2 days in a row without some kind of physical activity. ? Avoid being inactive for more than 30 minutes at a time. Take frequent breaks to walk or stretch.  Choose a type of exercise or activity that you enjoy, and set realistic goals.  Start slowly, and gradually increase the intensity of your exercise over time. What do I need to know about managing my  diabetes?   Check your blood glucose before and after exercising. ? If your blood glucose is 240 mg/dL (13.3 mmol/L) or higher before you exercise, check your urine for ketones. If you have ketones in your urine, do not exercise until your blood glucose returns to normal. ? If your blood glucose is 100 mg/dL (5.6 mmol/L) or lower, eat a snack containing 15-20 grams of carbohydrate. Check your blood glucose 15 minutes after the snack to make sure that your level is above 100 mg/dL (5.6 mmol/L) before you start your exercise.  Know the symptoms of low blood glucose (hypoglycemia) and how to treat it. Your risk for hypoglycemia increases during and after exercise. Common symptoms of hypoglycemia can include: ? Hunger. ? Anxiety. ? Sweating and feeling clammy. ? Confusion. ? Dizziness or feeling light-headed. ? Increased heart rate or palpitations. ? Blurry vision. ? Tingling or numbness around the mouth, lips, or tongue. ? Tremors or shakes. ? Irritability.  Keep a rapid-acting carbohydrate snack available before, during, and after exercise to help prevent or treat hypoglycemia.  Avoid injecting insulin into areas of the body that are going to be exercised. For example, avoid injecting insulin into: ? The arms, when playing tennis. ? The legs, when jogging.  Keep records of your exercise habits. Doing this can help you and your health care provider adjust your diabetes management plan as needed. Write down: ? Food that you eat before and after you exercise. ? Blood glucose levels before and after you exercise. ? The type and amount of exercise you have done. ? When your insulin is expected to peak, if you use   insulin. Avoid exercising at times when your insulin is peaking.  When you start a new exercise or activity, work with your health care provider to make sure the activity is safe for you, and to adjust your insulin, medicines, or food intake as needed.  Drink plenty of water while  you exercise to prevent dehydration or heat stroke. Drink enough fluid to keep your urine clear or pale yellow. Summary  Exercising regularly is important for your overall health, especially when you have diabetes (diabetes mellitus).  Exercising has many health benefits, such as increasing muscle strength and bone density and reducing body fat and stress.  Your health care provider or certified diabetes educator can help you make a plan for the type and frequency of exercise (activity plan) that works for you.  When you start a new exercise or activity, work with your health care provider to make sure the activity is safe for you, and to adjust your insulin, medicines, or food intake as needed. This information is not intended to replace advice given to you by your health care provider. Make sure you discuss any questions you have with your health care provider. Document Revised: 10/09/2016 Document Reviewed: 08/27/2015 Elsevier Patient Education  2020 Elsevier Inc.  
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
## 2019-05-17 ENCOUNTER — Ambulatory Visit
Admission: RE | Admit: 2019-05-17 | Discharge: 2019-05-17 | Disposition: A | Discharge status: Home / Self Care | Payer: Medicaid Other | Source: Ambulatory Visit | Attending: Radiation Oncology | Admitting: Radiation Oncology

## 2019-05-17 ENCOUNTER — Other Ambulatory Visit: Payer: Self-pay

## 2019-05-17 DIAGNOSIS — Z510 Encounter for antineoplastic radiation therapy: Secondary | ICD-10-CM | POA: Diagnosis not present

## 2019-05-18 ENCOUNTER — Ambulatory Visit
Admission: RE | Admit: 2019-05-18 | Discharge: 2019-05-18 | Disposition: A | Discharge status: Home / Self Care | Payer: Medicaid Other | Source: Ambulatory Visit | Attending: Radiation Oncology | Admitting: Radiation Oncology

## 2019-05-18 ENCOUNTER — Encounter: Payer: Self-pay | Admitting: *Deleted

## 2019-05-18 ENCOUNTER — Ambulatory Visit: Payer: Medicaid Other

## 2019-05-18 ENCOUNTER — Other Ambulatory Visit: Payer: Self-pay

## 2019-05-18 DIAGNOSIS — Z510 Encounter for antineoplastic radiation therapy: Secondary | ICD-10-CM | POA: Diagnosis not present

## 2019-05-19 ENCOUNTER — Ambulatory Visit: Payer: Medicaid Other

## 2019-05-20 ENCOUNTER — Encounter: Payer: Self-pay | Admitting: *Deleted

## 2019-05-20 ENCOUNTER — Encounter: Payer: Self-pay | Admitting: Internal Medicine

## 2019-05-20 ENCOUNTER — Ambulatory Visit
Admission: RE | Admit: 2019-05-20 | Discharge: 2019-05-20 | Disposition: A | Discharge status: Home / Self Care | Payer: Medicaid Other | Source: Ambulatory Visit | Attending: Radiation Oncology | Admitting: Radiation Oncology

## 2019-05-20 ENCOUNTER — Encounter: Payer: Self-pay | Admitting: Hematology and Oncology

## 2019-05-20 ENCOUNTER — Encounter: Payer: Self-pay | Admitting: Radiation Oncology

## 2019-05-20 ENCOUNTER — Other Ambulatory Visit: Payer: Self-pay

## 2019-05-20 DIAGNOSIS — Z510 Encounter for antineoplastic radiation therapy: Secondary | ICD-10-CM | POA: Diagnosis not present

## 2019-05-23 ENCOUNTER — Encounter: Payer: Self-pay | Admitting: Internal Medicine

## 2019-05-23 MED FILL — ?SPIRONOLACTONE 50 MG TABLE: 50 | 30 days supply | Qty: 30 | Fill #2

## 2019-05-23 MED FILL — ?CETIRIZINE HCL 10 MG TABLE: 10 | 30 days supply | Qty: 30 | Fill #2

## 2019-06-02 ENCOUNTER — Encounter: Payer: Self-pay | Admitting: Registered"

## 2019-06-02 ENCOUNTER — Other Ambulatory Visit: Payer: Self-pay

## 2019-06-02 ENCOUNTER — Encounter: Payer: Medicaid Other | Attending: Internal Medicine | Admitting: Registered"

## 2019-06-02 DIAGNOSIS — E669 Obesity, unspecified: Secondary | ICD-10-CM

## 2019-06-02 MED FILL — OXYBUTYNIN 5 MG TABLET: 5 | 30 days supply | Qty: 60 | Fill #2

## 2019-06-02 NOTE — Patient Instructions (Addendum)
-   Listen to your body and eat throughout the day  - Continue to eat 3 meals a day and have snacks  - Combine with carbohydrates + protein for breakfast such as cottage cheese + peaches OR oatmeal + eggs or peanut butter toast OR cereal + 1% dairy milk  - Aim to increase water intake to at least 32 oz

## 2019-06-02 NOTE — Progress Notes (Signed)
  Medical Nutrition Therapy:  Appt start time: 3:05 end time:  4:15.   Assessment:  Primary concerns today: Per recent labs A1c is 5.5 (05/16/19) and elevated chol (227) on 12/29/18).  Pt arrives with her husband.   States she is hurting at her job. Needs a better position with work. States she works about 5 hours/week. 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 States she is concerned about her finances. States she has been depressed due to having to pay people money. States she has a therapist and was seeing him regularly until 2020. Roxan Diesel is her therapist. States she was just released from breast cancer radiation last Fri, 2/26. States she is tired often and does not follow through with tasks.   States she reads and researches things a lot.   Pt expectations: wants to know what to eat and what not to eat to stay satisfied  States she has been this size for many years. States diets don't work for her and she's hungry often. States she avoids items high in salt. States her husband had a stroke and they want to know what can nutritionally help them both.   Makes spaghetti with vegetable pasta, 80/20 ground beef, peppers, onions along with salad with onions, carrots, etc. States she has been constipated the last few days. States she tries to have sweets only once a week. States she has experienced weight stigma and fat-shaming throughout her life.    Preferred Learning Style:   No preference indicated   Learning Readiness:   Ready  Change in progress   MEDICATIONS: See list   DIETARY INTAKE:  Usual eating pattern includes 2-3 meals and 1-2 snacks per day.  Everyday foods include pasta, cottage cheese, oatmeal, yogurt, fruit, sub, and chips.  Avoided foods include black-eyed peas.    24-hr recall:  B (6 AM): cottage cheese or yogurt Snk ( AM): 1/2 apple + banana   L (2 PM): spaghetti + toss salad  Snk ( PM):  D (8:30 PM): 6" [place] sub (tomatoes, lettuce,  mayo, mustard) + plain chips + diet coke  Snk ( PM):  Beverages: diet coke, water  Usual physical activity: none stated  Estimated energy needs: 1600 calories 180 g carbohydrates 120 g protein 44 g fat  Progress Towards Goal(s):  In progress.   Nutritional Diagnosis:  NB-1.1 Food and nutrition-related knowledge deficit As related to having balanced meals.  As evidenced by dietary recall.    Intervention:  Nutrition education and counseling. Pt was educated and counseled on diet culture, weight stigma, and how to balance meals. Pt was encouraged to continue managing diabetes well. Pt was in agreement with goals listed.  Goals: - Listen to your body and eat throughout the day - Continue to eat 3 meals a day and have snacks - Combine with carbohydrates + protein for breakfast such as cottage cheese + peaches OR oatmeal + eggs or peanut butter toast OR cereal + 1% dairy milk - Aim to increase water intake to at least 32 oz  Teaching Method Utilized:  Visual Auditory Hands on  Handouts given during visit include:  none  Barriers to learning/adherence to lifestyle change: finances  Demonstrated degree of understanding via:  Teach Back   Monitoring/Evaluation:  Dietary intake, exercise, and body weight in 1 month(s).

## 2019-06-08 MED FILL — metFORMIN HCL 500 MG TABS: 500 | 30 days supply | Qty: 30 | Fill #4

## 2019-06-20 MED FILL — LOSARTAN POTASSIUM 100 MG T: 100 | 30 days supply | Qty: 30 | Fill #2

## 2019-06-22 MED FILL — hydrALAZINE HCL 25 MG TABS: 25 | 30 days supply | Qty: 90 | Fill #2

## 2019-06-30 ENCOUNTER — Other Ambulatory Visit: Payer: Self-pay | Admitting: Internal Medicine

## 2019-06-30 DIAGNOSIS — R059 Cough, unspecified: Secondary | ICD-10-CM

## 2019-06-30 DIAGNOSIS — R05 Cough: Secondary | ICD-10-CM

## 2019-06-30 MED FILL — SPIRONOLACTONE 50 MG TABS: 50 | 30 days supply | Qty: 30 | Fill #0

## 2019-06-30 MED FILL — CETIRIZINE HCL 10 MG TABS: 10 | 30 days supply | Qty: 30 | Fill #0

## 2019-07-11 ENCOUNTER — Other Ambulatory Visit: Payer: Self-pay | Admitting: Internal Medicine

## 2019-07-11 DIAGNOSIS — N393 Stress incontinence (female) (male): Secondary | ICD-10-CM

## 2019-07-11 MED FILL — OXYBUTYNIN 5 MG TABLET: 5 | 30 days supply | Qty: 60 | Fill #0

## 2019-07-11 MED FILL — metFORMIN HCL 500 MG TABS: 500 | 30 days supply | Qty: 30 | Fill #5

## 2019-07-21 ENCOUNTER — Ambulatory Visit: Payer: No Typology Code available for payment source | Admitting: Registered"

## 2019-08-02 NOTE — Progress Notes (Signed)
  Radiation Oncology         [phone]) [phone] ________________________________  Name: Sabrina [name] MRN: [id number]  Date: 05/20/2019  DOB: [birth year]  End of Treatment Note  Diagnosis:   right-sided breast cancer     Indication for treatment:  Curative       Radiation treatment dates:   04/04/19 - 05/20/19  Site/dose:   The patient initially received a dose of 50 Gy in 25 fractions to the breast using whole-breast tangent fields. This was delivered using a 3-D conformal technique. The patient then received a boost to the seroma. This delivered an additional 16 Gy in 8 fractions using a electron technique. The total dose was 66 Gy.  Narrative: The patient tolerated radiation treatment relatively well.   The patient had some expected skin irritation as she progressed during treatment. Moist desquamation was not present at the end of treatment.  Plan: The patient has completed radiation treatment. The patient will return to radiation oncology clinic for routine followup in one month. I advised the patient to call or return sooner if they have any questions or concerns related to their recovery or treatment. ________________________________  Jodelle Gross, M.D., Ph.D.

## 2019-08-03 ENCOUNTER — Other Ambulatory Visit: Payer: Self-pay | Admitting: Family Medicine

## 2019-08-03 DIAGNOSIS — I10 Essential (primary) hypertension: Secondary | ICD-10-CM

## 2019-08-03 MED FILL — SPIRONOLACTONE 50 MG TABS: 50 | 30 days supply | Qty: 30 | Fill #1

## 2019-08-03 MED FILL — CETIRIZINE HCL 10 MG TABS: 10 | 30 days supply | Qty: 30 | Fill #1

## 2019-08-04 MED FILL — LOSARTAN POTASSIUM 100 MG T: 100 | 30 days supply | Qty: 30 | Fill #0

## 2019-08-05 ENCOUNTER — Encounter: Payer: Self-pay | Admitting: Internal Medicine

## 2019-08-11 ENCOUNTER — Ambulatory Visit (HOSPITAL_COMMUNITY)
Admission: EM | Admit: 2019-08-11 | Discharge: 2019-08-11 | Disposition: A | Discharge status: Home / Self Care | Payer: Medicaid Other | Attending: Family Medicine | Admitting: Family Medicine

## 2019-08-11 ENCOUNTER — Encounter (HOSPITAL_COMMUNITY): Payer: Self-pay

## 2019-08-11 ENCOUNTER — Other Ambulatory Visit: Payer: Self-pay

## 2019-08-11 ENCOUNTER — Ambulatory Visit (INDEPENDENT_AMBULATORY_CARE_PROVIDER_SITE_OTHER): Payer: Medicaid Other

## 2019-08-11 DIAGNOSIS — Z833 Family history of diabetes mellitus: Secondary | ICD-10-CM | POA: Insufficient documentation

## 2019-08-11 DIAGNOSIS — I10 Essential (primary) hypertension: Secondary | ICD-10-CM | POA: Insufficient documentation

## 2019-08-11 DIAGNOSIS — R0981 Nasal congestion: Secondary | ICD-10-CM | POA: Insufficient documentation

## 2019-08-11 DIAGNOSIS — R110 Nausea: Secondary | ICD-10-CM | POA: Diagnosis not present

## 2019-08-11 DIAGNOSIS — Z803 Family history of malignant neoplasm of breast: Secondary | ICD-10-CM | POA: Insufficient documentation

## 2019-08-11 DIAGNOSIS — E785 Hyperlipidemia, unspecified: Secondary | ICD-10-CM | POA: Insufficient documentation

## 2019-08-11 DIAGNOSIS — E119 Type 2 diabetes mellitus without complications: Secondary | ICD-10-CM | POA: Diagnosis not present

## 2019-08-11 DIAGNOSIS — R0789 Other chest pain: Secondary | ICD-10-CM | POA: Diagnosis not present

## 2019-08-11 DIAGNOSIS — Z8249 Family history of ischemic heart disease and other diseases of the circulatory system: Secondary | ICD-10-CM | POA: Insufficient documentation

## 2019-08-11 DIAGNOSIS — Z79899 Other long term (current) drug therapy: Secondary | ICD-10-CM | POA: Diagnosis not present

## 2019-08-11 DIAGNOSIS — R6883 Chills (without fever): Secondary | ICD-10-CM

## 2019-08-11 DIAGNOSIS — Z853 Personal history of malignant neoplasm of breast: Secondary | ICD-10-CM | POA: Diagnosis not present

## 2019-08-11 DIAGNOSIS — Z923 Personal history of irradiation: Secondary | ICD-10-CM | POA: Diagnosis not present

## 2019-08-11 DIAGNOSIS — R0602 Shortness of breath: Secondary | ICD-10-CM

## 2019-08-11 DIAGNOSIS — Z888 Allergy status to other drugs, medicaments and biological substances status: Secondary | ICD-10-CM | POA: Insufficient documentation

## 2019-08-11 DIAGNOSIS — Z7982 Long term (current) use of aspirin: Secondary | ICD-10-CM | POA: Insufficient documentation

## 2019-08-11 DIAGNOSIS — Z880 Allergy status to penicillin: Secondary | ICD-10-CM | POA: Insufficient documentation

## 2019-08-11 DIAGNOSIS — R05 Cough: Secondary | ICD-10-CM | POA: Insufficient documentation

## 2019-08-11 DIAGNOSIS — Z7984 Long term (current) use of oral hypoglycemic drugs: Secondary | ICD-10-CM | POA: Insufficient documentation

## 2019-08-11 DIAGNOSIS — K219 Gastro-esophageal reflux disease without esophagitis: Secondary | ICD-10-CM | POA: Diagnosis not present

## 2019-08-11 DIAGNOSIS — Z20822 Contact with and (suspected) exposure to covid-19: Secondary | ICD-10-CM | POA: Diagnosis not present

## 2019-08-11 DIAGNOSIS — R059 Cough, unspecified: Secondary | ICD-10-CM

## 2019-08-11 MED ORDER — DEXTROMETHORPHAN POLISTIREX ER 30 MG/5ML PO SUER: 60.0000 mg | Freq: Two times a day (BID) | ORAL | 0 refills | Status: DC | PRN

## 2019-08-11 MED ORDER — ALBUTEROL SULFATE HFA 108 (90 BASE) MCG/ACT IN AERS: 2.0000 | INHALATION_SPRAY | Freq: Four times a day (QID) | RESPIRATORY_TRACT | 0 refills | Status: DC | PRN

## 2019-08-11 MED ORDER — SPACER/AERO-HOLDING CHAMBERS DEVI: 0 refills | Status: AC

## 2019-08-11 NOTE — ED Triage Notes (Signed)
Pt states she has a cough headaches and chills  X 1 week.

## 2019-08-11 NOTE — Discharge Instructions (Addendum)
Recommend start Delsym cough syrup 2 teaspoons every 12 hours as needed. Use Albuterol inhaler with spacer 2 puffs every 6 hours as needed for cough or wheezing. Rest. May take Tylenol 1000mg  every 8 hours as needed for fever or chills. Follow-up pending COVID 19 test results and with your PCP in 4 to 5 days if not improving.
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
## 2019-08-12 LAB — SARS CORONAVIRUS 2 (TAT 6-24 HRS): SARS Coronavirus 2: NEGATIVE

## 2019-08-17 MED FILL — METFORMIN HCL 500 MG TABS: 500 | 30 days supply | Qty: 30 | Fill #6

## 2019-08-23 ENCOUNTER — Other Ambulatory Visit: Payer: Self-pay

## 2019-08-23 ENCOUNTER — Ambulatory Visit (HOSPITAL_BASED_OUTPATIENT_CLINIC_OR_DEPARTMENT_OTHER): Payer: Medicaid Other | Admitting: Internal Medicine

## 2019-08-23 DIAGNOSIS — Z538 Procedure and treatment not carried out for other reasons: Secondary | ICD-10-CM

## 2019-08-24 NOTE — Progress Notes (Signed)
Pt elected to reschedule this televisit after being told by CMA that we were running behind.

## 2019-08-25 ENCOUNTER — Other Ambulatory Visit: Payer: Self-pay

## 2019-08-25 ENCOUNTER — Other Ambulatory Visit: Payer: Self-pay | Admitting: Family

## 2019-08-25 ENCOUNTER — Ambulatory Visit: Payer: Medicaid Other | Attending: Internal Medicine | Admitting: Family

## 2019-08-25 DIAGNOSIS — N393 Stress incontinence (female) (male): Secondary | ICD-10-CM | POA: Diagnosis not present

## 2019-08-25 DIAGNOSIS — D0511 Intraductal carcinoma in situ of right breast: Secondary | ICD-10-CM

## 2019-08-25 DIAGNOSIS — I10 Essential (primary) hypertension: Secondary | ICD-10-CM

## 2019-08-25 DIAGNOSIS — Z09 Encounter for follow-up examination after completed treatment for conditions other than malignant neoplasm: Secondary | ICD-10-CM

## 2019-08-25 DIAGNOSIS — E1169 Type 2 diabetes mellitus with other specified complication: Secondary | ICD-10-CM

## 2019-08-25 DIAGNOSIS — E669 Obesity, unspecified: Secondary | ICD-10-CM

## 2019-08-25 MED ORDER — LOSARTAN POTASSIUM 100 MG PO TABS: ORAL_TABLET | ORAL | 3 refills | Status: DC

## 2019-08-25 MED ORDER — METFORMIN HCL 500 MG PO TABS: 500.0000 mg | ORAL_TABLET | Freq: Every day | ORAL | 3 refills | Status: DC

## 2019-08-25 MED ORDER — HYDRALAZINE HCL 25 MG PO TABS: ORAL_TABLET | ORAL | 2 refills | Status: DC

## 2019-08-25 MED FILL — hydrALAZINE HCL 25 MG TABS: 25 | 30 days supply | Qty: 90 | Fill #0

## 2019-08-25 NOTE — Patient Instructions (Addendum)
Continue Hydralazine and Losartan for blood pressure. Continue Metformin for diabetes. Continue Oxybutynin for stress incontinence. Follow-up with oncology. Follow-up with primary physician in 3 months or sooner if needed. Hypertension, Adult Hypertension is another name for high blood pressure. High blood pressure forces your heart to work harder to pump blood. This can cause problems over time. There are two numbers in a blood pressure reading. There is a top number (systolic) over a bottom number (diastolic). It is best to have a blood pressure that is below 120/80. Healthy choices can help lower your blood pressure, or you may need medicine to help lower it. What are the causes? The cause of this condition is not known. Some conditions may be related to high blood pressure. What increases the risk?  Smoking.  Having type 2 diabetes mellitus, high cholesterol, or both.  Not getting enough exercise or physical activity.  Being overweight.  Having too much fat, sugar, calories, or salt (sodium) in your diet.  Drinking too much alcohol.  Having long-term (chronic) kidney disease.  Having a family history of high blood pressure.  Age. Risk increases with age.  Race. You may be at higher risk if you are African American.  Gender. Men are at higher risk than women before age 67. After age 81, women are at higher risk than men.  Having obstructive sleep apnea.  Stress. What are the signs or symptoms?  High blood pressure may not cause symptoms. Very high blood pressure (hypertensive crisis) may cause: ? Headache. ? Feelings of worry or nervousness (anxiety). ? Shortness of breath. ? Nosebleed. ? A feeling of being sick to your stomach (nausea). ? Throwing up (vomiting). ? Changes in how you see. ? Very bad chest pain. ? Seizures. How is this treated?  This condition is treated by making healthy lifestyle changes, such as: ? Eating healthy foods. ? Exercising more. ?  Drinking less alcohol.  Your health care provider may prescribe medicine if lifestyle changes are not enough to get your blood pressure under control, and if: ? Your top number is above 130. ? Your bottom number is above 80.  Your personal target blood pressure may vary. Follow these instructions at home: Eating and drinking   If told, follow the DASH eating plan. To follow this plan: ? Fill one half of your plate at each meal with fruits and vegetables. ? Fill one fourth of your plate at each meal with whole grains. Whole grains include whole-wheat pasta, brown rice, and whole-grain bread. ? Eat or drink low-fat dairy products, such as skim milk or low-fat yogurt. ? Fill one fourth of your plate at each meal with low-fat (lean) proteins. Low-fat proteins include fish, chicken without skin, eggs, beans, and tofu. ? Avoid fatty meat, cured and processed meat, or chicken with skin. ? Avoid pre-made or processed food.  Eat less than 1,500 mg of salt each day.  Do not drink alcohol if: ? Your doctor tells you not to drink. ? You are pregnant, may be pregnant, or are planning to become pregnant.  If you drink alcohol: ? Limit how much you use to:  0-1 drink a day for women.  0-2 drinks a day for men. ? Be aware of how much alcohol is in your drink. In the [place] one drink equals one 12 oz bottle of beer (355 mL), one 5 oz glass of wine (148 mL), or one 1 oz glass of hard liquor (44 mL). Lifestyle   Work with your  doctor to stay at a healthy weight or to lose weight. Ask your doctor what the best weight is for you.  Get at least 30 minutes of exercise most days of the week. This may include walking, swimming, or biking.  Get at least 30 minutes of exercise that strengthens your muscles (resistance exercise) at least 3 days a week. This may include lifting weights or doing Pilates.  Do not use any products that contain nicotine or tobacco, such as cigarettes, e-cigarettes, and  chewing tobacco. If you need help quitting, ask your doctor.  Check your blood pressure at home as told by your doctor.  Keep all follow-up visits as told by your doctor. This is important. Medicines  Take over-the-counter and prescription medicines only as told by your doctor. Follow directions carefully.  Do not skip doses of blood pressure medicine. The medicine does not work as well if you skip doses. Skipping doses also puts you at risk for problems.  Ask your doctor about side effects or reactions to medicines that you should watch for. Contact a doctor if you:  Think you are having a reaction to the medicine you are taking.  Have headaches that keep coming back (recurring).  Feel dizzy.  Have swelling in your ankles.  Have trouble with your vision. Get help right away if you:  Get a very bad headache.  Start to feel mixed up (confused).  Feel weak or numb.  Feel faint.  Have very bad pain in your: ? Chest. ? Belly (abdomen).  Throw up more than once.  Have trouble breathing. Summary  Hypertension is another name for high blood pressure.  High blood pressure forces your heart to work harder to pump blood.  For most people, a normal blood pressure is less than 120/80.  Making healthy choices can help lower blood pressure. If your blood pressure does not get lower with healthy choices, you may need to take medicine. This information is not intended to replace advice given to you by your health care provider. Make sure you discuss any questions you have with your health care provider. Document Revised: 11/25/2017 Document Reviewed: 11/25/2017 Elsevier Patient Education  2020 [employer].

## 2019-08-25 NOTE — Progress Notes (Signed)
Virtual Visit via Telephone Note  I connected with Sabrina [name], on 08/25/2019 at 8:33 AM by telephone due to the COVID-19 pandemic and verified that I am speaking with the correct person using two identifiers.  Due to current restrictions/limitations of in-office visits due to the COVID-19 pandemic, this scheduled clinical appointment was converted to a telehealth visit.   Consent: I discussed the limitations, risks, security and privacy concerns of performing an evaluation and management service by telephone and the availability of in person appointments. I also discussed with the patient that there may be a patient responsible charge related to this service. The patient expressed understanding and agreed to proceed.   Location of Patient: Home  Location of Provider: [facility]   Persons participating in Telemedicine visit: [name], NP [name], [place]  History of Present Illness: Sabrina [name]. [name] is a 59 year old female with history of hypertension, OSA, GERD, diabetes mellitus type 2 in obese, Morton's neuroma of right foot, osteoarthritis, hyperlipidemia, bipolar disorder, and stress incontinence who presents for 40-monthfollow-up and hospital follow-up.  1. HYPERTENSION FOLLOW-UP: Currently taking: see medication list Med Adherence: [] Yes    [x] No, reports taking Hydralazine twice daily instead of three times daily as prescribed. Medication side effects: [] Yes    [x] No Adherence with salt restriction: [x] Yes    [] No Exercise: Yes [] No [x], but trying  Home Monitoring?: [] Yes    [x] No Monitoring Frequency: [] Yes    [x] No Home BP results range: [] Yes    [x] No Smoking [] Yes [x] No  SOB? [] Yes    [x] No Chest Pain?: [] Yes    [x] No Leg swelling?: [x] Yes, bilateral ankles  Headaches?: [x] Yes    [] No Dizziness? [] Yes    [x] No  Comments: Last visit 05/16/2019 with Dr. JWynetta Emery During that  encounter continued on Losartan and Hydralazine.  2. DIABETES TYPE 2 FOLLOW-UP: Last A1C: 5.5, February 2021   Med Adherence:  [x] Yes    [] No Medication side effects:  [x] Yes, diarrhea  Home Monitoring?  [] Yes    [x] No Home glucose results range: none Diet Adherence: [x] Yes    [] No Exercise: [] Yes    [x] No  Hypoglycemic episodes?: [] Yes    [x] No Numbness of the feet? [x] Yes    [] No Retinopathy hx? [] Yes    [x] No Last eye exam: 2 years ago  Comments: Last visit 05/16/2019 with Dr. JWynetta Emery During that encounter blood sugar was controlled and continued on Metformin. Referred to ophthalmology at that time.  3. URINARY INCONTINENCE:  Reports urinating a lot in the middle of the night. Reports wears a sanitary napkin during the day. Reports taking Oxybutynin as prescribed. Request refills.   4. HOSPITAL FOLLOW-UP: Reports doing well. States still has cough but has improved. Reports taking Albuterol and Tylenol as needed. [facility] practitioner [facility] During that encounter patient treated for cough, shortness of breath, chills, and nausea without vomiting. Chest x-ray resulted no pneumonia or heart enlargement. Covid negative. Patient discharged home with Albuterol and Delsym for cough/wheezing. Tylenol recommended as needed for fever or chills.   Past Medical History:  Diagnosis Date  . Anxiety   . Arthritis    "legs, knees, hands" (11/16/2014)  . Bipolar disorder ([facility]  2 breakdowns - 1998, 2000 had to be hospitalized, followed at [facility]  . Chest pain    a. Myoview 6/16:  anterior and apical ischemia, EF 55-65%;  b. LHC 8/16:  no CAD, Normal EF  . Chest pain 10/2015  . Chronic bronchitis (HCC)    "get it q yr"  . Chronic lower back pain   . Depression   . Family history of brain cancer   . Family history of breast cancer   . Family history of prostate cancer   . Family history of throat cancer   . GERD  (gastroesophageal reflux disease)   . Gout   . History of echocardiogram    a. Echo 12/15:  Mild LVH, EF 55-60%, mild LAE, PASP 36 mmHg  . Hyperlipidemia LDL goal < 100    "not on RX" (11/15/2014)  . Hypertension   . Migraine    "monthly" (11/16/2014)  . Mixed restrictive and obstructive lung disease (HCC)    [facility] suggests PFTs done 1/10  . Morbid obesity with BMI of 40.0-44.9, adult (HCC)   . Rheumatoid arthritis (HCC)    [facility] records indicate Rheumatoid  . Seizures (HCC)    "might have had 1; I'm on depakote" (11/16/2014)  . Type II diabetes mellitus (HCC)    Allergies  Allergen Reactions  . Benzonatate Other (See Comments)    Made her cough worse  . Latex Dermatitis and Other (See Comments)    Hands became scaly  . Penicillins Other (See Comments)    Did it involve swelling of the face/tongue/throat, SOB, or low BP? Unk Did it involve sudden or severe rash/hives, skin peeling, or any reaction on the inside of your mouth or nose? Unk Did you need to seek medical attention at a hospital or doctor's office? Unk When did it last happen? "I was little; I don't remember, but my parents told me to never take it." If all above answers are "NO", may proceed with cephalosporin use.    . Lisinopril Cough  . Oxycodone-Acetaminophen Nausea And Vomiting    Pt thinks she may be able to take with food (??)    Current Outpatient Medications on File Prior to Visit  Medication Sig Dispense Refill  . albuterol (VENTOLIN HFA) 108 (90 Base) MCG/ACT inhaler Inhale 2 puffs into the lungs every 6 (six) hours as needed for wheezing or shortness of breath. 18 g 0  . aspirin EC 81 MG tablet Take 1 tablet (81 mg total) by mouth daily. For heart health    . Blood Glucose Monitoring Suppl (TRUE METRIX METER) w/Device KIT Use as directed 1 kit 0  . cetirizine (ZYRTEC) 10 MG tablet TAKE 1 TABLET (10 MG TOTAL) BY MOUTH DAILY AS NEEDED. 30 tablet 2  . dextromethorphan (DELSYM) 30  MG/5ML liquid Take 10 mLs (60 mg total) by mouth 2 (two) times daily as needed for cough. 148 mL 0  . divalproex (DEPAKOTE ER) 250 MG 24 hr tablet Take 3 tablets (750 mg total) by mouth at bedtime. For mood stabilization 90 tablet 0  . glucose blood (TRUE METRIX BLOOD GLUCOSE TEST) test strip Use as instructed 100 each 12  . hydrALAZINE (APRESOLINE) 25 MG tablet TAKE 1 TABLET (25 MG TOTAL) BY MOUTH 3 (THREE) TIMES DAILY. (Patient taking differently: TAKE 1 TABLET (25 MG TOTAL) BY MOUTH 2 (TWO) TIMES DAILY.) 90 tablet 2  . ibuprofen (ADVIL) 600 MG tablet Take 1 tablet (600 mg total) by mouth every 6 (six)   hours as needed. 20 tablet 0  . losartan (COZAAR) 100 MG tablet TAKE 1 TABLET (100 MG TOTAL) BY MOUTH EVERY EVENING. 30 tablet 2  . metFORMIN (GLUCOPHAGE) 500 MG tablet Take 1 tablet (500 mg total) by mouth daily with breakfast. For diabetes manangement 60 tablet 3  . oxybutynin (DITROPAN) 5 MG tablet TAKE 1 TABLET BY MOUTH 2 TIMES DAILY. 60 tablet 2  . Spacer/Aero-Holding Chambers DEVI Use Spacer with Albuterol inhaler as needed 1 Container 0  . spironolactone (ALDACTONE) 50 MG tablet TAKE 1 TABLET (50 MG TOTAL) BY MOUTH DAILY. 30 tablet 2  . TRUEPLUS LANCETS 28G MISC Use as directed 100 each 6  . [DISCONTINUED] traZODone (DESYREL) 50 MG tablet Take 1 tablet (50 mg total) by mouth at bedtime as needed for sleep. (Patient not taking: Reported on 05/16/2019) 30 tablet 0   No current facility-administered medications on file prior to visit.    Observations/Objective: Alert and oriented x 3. Not in acute distress. Physical examination not completed as this is a telemedicine visit.  Assessment and Plan: 1. Essential hypertension: -Continue Hydralazine and Losartan as prescribed. Patient reports she takes Hydralazine twice daily because she doesn't like taking a lot of pills. -Counseled on blood pressure goal of less than 130/80, low-sodium, DASH diet, medication compliance, 150 minutes of moderate  intensity exercise per week. Discussed medication compliance, adverse effects. -Follow-up with primary physician in 3 months or sooner if needed. - hydrALAZINE (APRESOLINE) 25 MG tablet; TAKE 1 TABLET (25 MG TOTAL) BY MOUTH 3 (THREE) TIMES DAILY.  Dispense: 90 tablet; Refill: 2 - losartan (COZAAR) 100 MG tablet; TAKE 1 TABLET (100 MG TOTAL) BY MOUTH EVERY EVENING.  Dispense: 30 tablet; Refill: 3  2. Diabetes mellitus type 2 in obese (HCC): -Continue Metformin as prescribed.  -To achieve an A1C goal of less than or equal to 7.0 percent, a fasting blood sugar of 80 to 130 mg/dL and a postprandial glucose (90 to 120 minutes after a meal) less than 180 mg/dL. In the event of sugars less than 60 mg/dl or greater than 400 mg/dl please notify the clinic ASAP. It is recommended that you undergo annual eye exams and annual foot exams. -Discussed the importance of healthy eating habits, low-carbohydrate diet, low-sugar diet, regular aerobic exercise (at least 150 minutes a week as tolerated) and medication compliance to achieve or maintain control of diabetes. -Follow-up with primary physician in 3 months or sooner if needed. - metFORMIN (GLUCOPHAGE) 500 MG tablet; Take 1 tablet (500 mg total) by mouth daily with breakfast. For diabetes manangement  Dispense: 30 tablet; Refill: 3  3. Stress incontinence: -Continue Oxybutynin as prescribed. Patient still has refills available at pharmacy. -Follow-up with primary physician as needed.  4. Hospital discharge follow-up: -Patient doing well. Cough improved since discharge from urgent care on 08/11/2019.  5. Ductal carcinoma in situ (DCIS) of right breast: -Per oncology note last treatment February 2021 and to follow-up in 1 month. Today patient reports she has not returned for oncology for follow-up. Encouraged to do so.  Follow Up Instructions: Patient was given clear instructions to go to Emergency Department or return to medical center if symptoms don't  improve, worsen, or new problems develop.The patient verbalized understanding.  I discussed the assessment and treatment plan with the patient. The patient was provided an opportunity to ask questions and all were answered. The patient agreed with the plan and demonstrated an understanding of the instructions.   The patient was advised to call back or   seek an in-person evaluation if the symptoms worsen or if the condition fails to improve as anticipated.  I provided 30 minutes total of non-face-to-face time during this encounter including median intraservice time, reviewing previous notes, labs, imaging, medications, management and patient verbalized understanding.     J , NP  [facility] [place], [place] [phone]   08/25/2019, 8:33 AM 

## 2019-08-30 MED FILL — OXYBUTYNIN 5 MG TABLET: 5 | 30 days supply | Qty: 60 | Fill #1

## 2019-09-15 ENCOUNTER — Ambulatory Visit: Payer: No Typology Code available for payment source | Admitting: Internal Medicine

## 2019-09-20 MED FILL — METFORMIN HCL 500 MG TABS: 500 | 30 days supply | Qty: 30 | Fill #7

## 2019-10-03 ENCOUNTER — Emergency Department (HOSPITAL_COMMUNITY): Payer: Medicaid Other

## 2019-10-03 ENCOUNTER — Encounter (HOSPITAL_COMMUNITY): Payer: Self-pay | Admitting: *Deleted

## 2019-10-03 ENCOUNTER — Other Ambulatory Visit: Payer: Self-pay

## 2019-10-03 ENCOUNTER — Emergency Department (HOSPITAL_COMMUNITY)
Admission: EM | Admit: 2019-10-03 | Discharge: 2019-10-03 | Disposition: A | Discharge status: Home / Self Care | Payer: Medicaid Other | Attending: Emergency Medicine | Admitting: Emergency Medicine

## 2019-10-03 DIAGNOSIS — M069 Rheumatoid arthritis, unspecified: Secondary | ICD-10-CM | POA: Diagnosis not present

## 2019-10-03 DIAGNOSIS — R0981 Nasal congestion: Secondary | ICD-10-CM | POA: Diagnosis not present

## 2019-10-03 DIAGNOSIS — F319 Bipolar disorder, unspecified: Secondary | ICD-10-CM | POA: Diagnosis not present

## 2019-10-03 DIAGNOSIS — R05 Cough: Secondary | ICD-10-CM | POA: Insufficient documentation

## 2019-10-03 DIAGNOSIS — Z7722 Contact with and (suspected) exposure to environmental tobacco smoke (acute) (chronic): Secondary | ICD-10-CM | POA: Diagnosis not present

## 2019-10-03 DIAGNOSIS — E119 Type 2 diabetes mellitus without complications: Secondary | ICD-10-CM | POA: Diagnosis not present

## 2019-10-03 DIAGNOSIS — I10 Essential (primary) hypertension: Secondary | ICD-10-CM | POA: Diagnosis not present

## 2019-10-03 DIAGNOSIS — Z91040 Latex allergy status: Secondary | ICD-10-CM | POA: Diagnosis not present

## 2019-10-03 DIAGNOSIS — J449 Chronic obstructive pulmonary disease, unspecified: Secondary | ICD-10-CM | POA: Insufficient documentation

## 2019-10-03 DIAGNOSIS — Z7984 Long term (current) use of oral hypoglycemic drugs: Secondary | ICD-10-CM | POA: Insufficient documentation

## 2019-10-03 DIAGNOSIS — Z79899 Other long term (current) drug therapy: Secondary | ICD-10-CM | POA: Diagnosis not present

## 2019-10-03 DIAGNOSIS — Z7982 Long term (current) use of aspirin: Secondary | ICD-10-CM | POA: Insufficient documentation

## 2019-10-03 DIAGNOSIS — R059 Cough, unspecified: Secondary | ICD-10-CM

## 2019-10-03 LAB — CBC
HCT: 37.9 % (ref 36.0–46.0)
Hemoglobin: 12.6 g/dL (ref 12.0–15.0)
MCH: 32.4 pg (ref 26.0–34.0)
MCHC: 33.2 g/dL (ref 30.0–36.0)
MCV: 97.4 fL (ref 80.0–100.0)
Platelets: 354 10*3/uL (ref 150–400)
RBC: 3.89 MIL/uL (ref 3.87–5.11)
RDW: 13.2 % (ref 11.5–15.5)
WBC: 6.1 10*3/uL (ref 4.0–10.5)
nRBC: 0 % (ref 0.0–0.2)

## 2019-10-03 LAB — BASIC METABOLIC PANEL
Anion gap: 9 (ref 5–15)
BUN: 16 mg/dL (ref 6–20)
CO2: 23 mmol/L (ref 22–32)
Calcium: 9.3 mg/dL (ref 8.9–10.3)
Chloride: 105 mmol/L (ref 98–111)
Creatinine, Ser: 0.92 mg/dL (ref 0.44–1.00)
GFR calc Af Amer: >60 mL/min (ref >60)
GFR calc non Af Amer: >60 mL/min (ref >60)
Glucose, Bld: 117 mg/dL — ABNORMAL HIGH (ref 70–99)
Potassium: 4.4 mmol/L (ref 3.5–5.1)
Sodium: 137 mmol/L (ref 135–145)

## 2019-10-03 LAB — TROPONIN I (HIGH SENSITIVITY): Troponin I (High Sensitivity): 3 ng/L (ref <18)

## 2019-10-03 LAB — I-STAT BETA HCG BLOOD, ED (MC, WL, AP ONLY): I-stat hCG, quantitative: <5 m[IU]/mL (ref <5)

## 2019-10-03 MED ORDER — OMEPRAZOLE 20 MG PO CPDR
20.0000 mg | DELAYED_RELEASE_CAPSULE | Freq: Every day | ORAL | 0 refills | Status: DC
Start: 2019-10-03 — End: 2019-12-27

## 2019-10-03 MED ORDER — SODIUM CHLORIDE 0.9% FLUSH: 3.0000 mL | Freq: Once | INTRAVENOUS | Status: DC

## 2019-10-03 MED ORDER — FLUTICASONE PROPIONATE 50 MCG/ACT NA SUSP
2.0000 | Freq: Every day | NASAL | 0 refills | Status: DC
Start: 2019-10-03 — End: 2020-02-21
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
## 2019-10-03 NOTE — ED Notes (Signed)
Pt c/o persistent cough for a year that is getting worse today with some chest tightness.

## 2019-10-03 NOTE — Discharge Instructions (Addendum)
Please take omeprazole and use the intranasal fluticasone as we discussed.  Please take this regularly and follow back up with your pulmonologist whose phone number and address I have put back into your chart.  This is an pulmonologist that you saw 2 years ago.  Your chest x-ray was unremarkable today however I believe it is important for you to follow back up with a pulmonologist since you are symptoms have been progressing.   Return to ED for any new or concerning symptoms -- otherwise please follow up with pulmonology.  The 3 most common causes of chronic cough are acid reflux, allergic rhinitis and asthma.  Your PFTs were not consistent with asthma in the past however it is important to see your pulmonologist to rule out more sinister causes of this chronic cough.

## 2019-10-05 ENCOUNTER — Encounter: Payer: Self-pay | Admitting: Internal Medicine

## 2019-10-07 MED FILL — LOSARTAN POTASSIUM 100 MG T: 100 | 30 days supply | Qty: 30 | Fill #1

## 2019-10-13 MED FILL — OXYBUTYNIN 5 MG TABLET: 5 | 30 days supply | Qty: 60 | Fill #2

## 2019-10-17 ENCOUNTER — Encounter: Payer: Self-pay | Admitting: Internal Medicine

## 2019-10-17 ENCOUNTER — Other Ambulatory Visit: Payer: Self-pay | Admitting: Internal Medicine

## 2019-10-17 DIAGNOSIS — R059 Cough, unspecified: Secondary | ICD-10-CM

## 2019-10-17 MED FILL — CETIRIZINE HCL 10 MG TABS: 10 | 30 days supply | Qty: 30 | Fill #0

## 2019-10-17 MED FILL — SPIRONOLACTONE 50 MG TABS: 50 | 30 days supply | Qty: 30 | Fill #0

## 2019-11-02 MED FILL — METFORMIN HCL 500 MG TABS: 500 | 30 days supply | Qty: 30 | Fill #0

## 2019-11-07 MED FILL — hydrALAZINE HCL 25 MG TABS: 25 | 30 days supply | Qty: 90 | Fill #1

## 2019-11-09 ENCOUNTER — Other Ambulatory Visit: Payer: Self-pay | Admitting: Internal Medicine

## 2019-11-09 DIAGNOSIS — N393 Stress incontinence (female) (male): Secondary | ICD-10-CM

## 2019-11-09 MED FILL — LOSARTAN POTASSIUM 100 MG T: 100 | 30 days supply | Qty: 30 | Fill #2

## 2019-11-09 MED FILL — OXYBUTYNIN 5 MG TABLET: 5 | 30 days supply | Qty: 60 | Fill #0

## 2019-11-25 ENCOUNTER — Ambulatory Visit: Payer: Medicaid Other | Attending: Internal Medicine | Admitting: Internal Medicine

## 2019-11-25 ENCOUNTER — Encounter: Payer: Self-pay | Admitting: Internal Medicine

## 2019-11-25 ENCOUNTER — Other Ambulatory Visit: Payer: Self-pay

## 2019-11-25 ENCOUNTER — Ambulatory Visit (HOSPITAL_BASED_OUTPATIENT_CLINIC_OR_DEPARTMENT_OTHER): Payer: Medicaid Other | Admitting: Pharmacist

## 2019-11-25 DIAGNOSIS — Z23 Encounter for immunization: Secondary | ICD-10-CM

## 2019-11-25 DIAGNOSIS — E1169 Type 2 diabetes mellitus with other specified complication: Secondary | ICD-10-CM | POA: Diagnosis not present

## 2019-11-25 DIAGNOSIS — L309 Dermatitis, unspecified: Secondary | ICD-10-CM | POA: Diagnosis not present

## 2019-11-25 DIAGNOSIS — E669 Obesity, unspecified: Secondary | ICD-10-CM | POA: Diagnosis not present

## 2019-11-25 DIAGNOSIS — N3281 Overactive bladder: Secondary | ICD-10-CM | POA: Diagnosis not present

## 2019-11-25 DIAGNOSIS — M62838 Other muscle spasm: Secondary | ICD-10-CM

## 2019-11-25 DIAGNOSIS — I10 Essential (primary) hypertension: Secondary | ICD-10-CM

## 2019-11-25 LAB — GLUCOSE, POCT (MANUAL RESULT ENTRY): POC Glucose: 126 mg/dl — AB (ref 70–99)

## 2019-11-25 LAB — POCT GLYCOSYLATED HEMOGLOBIN (HGB A1C): Hemoglobin A1C: 5.5 % (ref 4.0–5.6)

## 2019-11-25 MED ORDER — HYDRALAZINE HCL 50 MG PO TABS: 50.0000 mg | ORAL_TABLET | Freq: Two times a day (BID) | ORAL | 4 refills | Status: DC

## 2019-11-25 MED FILL — hydrALAZINE HCL 50 MG TABS: 50 | 20 days supply | Qty: 60 | Fill #0

## 2019-11-25 NOTE — Progress Notes (Signed)
Patient presents for vaccination against influenza per orders of Dr. Johnson. Consent given. Counseling provided. No contraindications exists. Vaccine administered without incident.  ° °Luke Van Ausdall, PharmD, CPP °Clinical Pharmacist °[facility] °[phone] ° °
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
## 2019-11-25 NOTE — Patient Instructions (Addendum)
Stop spironolactone. Increase hydralazine to 50 mg twice a day.   Muscle Cramps and Spasms Muscle cramps and spasms are when muscles tighten by themselves. They usually get better within minutes. Muscle cramps are painful. They are usually stronger and last longer than muscle spasms. Muscle spasms may or may not be painful. They can last a few seconds or much longer. Cramps and spasms can affect any muscle, but they occur most often in the calf muscles of the leg. They are usually not caused by a serious problem. In many cases, the cause is not known. Some common causes include:  Doing more physical work or exercise than your body is ready for.  Using the muscles too much (overuse) by repeating certain movements too many times.  Staying in a certain position for a long time.  Playing a sport or doing an activity without preparing properly.  Using bad form or technique while playing a sport or doing an activity.  Not having enough water in your body (dehydration).  Injury.  Side effects of some medicines.  Low levels of the salts and minerals in your blood (electrolytes), such as low potassium or calcium. Follow these instructions at home: Managing pain and stiffness      Massage, stretch, and relax the muscle. Do this for many minutes at a time.  If told, put heat on tight or tense muscles as often as told by your doctor. Use the heat source that your doctor recommends, such as a moist heat pack or a heating pad. ? Place a towel between your skin and the heat source. ? Leave the heat on for 20-30 minutes. ? Remove the heat if your skin turns bright red. This is very important if you are not able to feel pain, heat, or cold. You may have a greater risk of getting burned.  If told, put ice on the affected area. This may help if you are sore or have pain after a cramp or spasm. ? Put ice in a plastic bag. ? Place a towel between your skin and the bag. ? Leave the ice on for 20  minutes, 2-3 times a day.  Try taking hot showers or baths to help relax tight muscles. Eating and drinking  Drink enough fluid to keep your pee (urine) pale yellow.  Eat a healthy diet to help ensure that your muscles work well. This should include: ? Fruits and vegetables. ? Lean protein. ? Whole grains. ? Low-fat or nonfat dairy products. General instructions  If you are having cramps often, avoid intense exercise for several days.  Take over-the-counter and prescription medicines only as told by your doctor.  Watch for any changes in your symptoms.  Keep all follow-up visits as told by your doctor. This is important. Contact a doctor if:  Your cramps or spasms get worse or happen more often.  Your cramps or spasms do not get better with time. Summary  Muscle cramps and spasms are when muscles tighten by themselves. They usually get better within minutes.  Cramps and spasms occur most often in the calf muscles of the leg.  Massage, stretch, and relax the muscle. This may help the cramp or spasm go away.  Drink enough fluid to keep your pee (urine) pale yellow. This information is not intended to replace advice given to you by your health care provider. Make sure you discuss any questions you have with your health care provider. Document Revised: 08/10/2017 Document Reviewed: 08/10/2017 Elsevier Patient Education  [place].   Influenza Virus Vaccine injection (Fluarix) What is this medicine? INFLUENZA VIRUS VACCINE (in floo EN zuh VAHY ruhs vak SEEN) helps to reduce the risk of getting influenza also known as the flu. This medicine may be used for other purposes; ask your health care provider or pharmacist if you have questions. COMMON BRAND NAME(S): Fluarix, Fluzone What should I tell my health care provider before I take this medicine? They need to know if you have any of these conditions:  bleeding disorder like hemophilia  fever or  infection  Guillain-Barre syndrome or other neurological problems  immune system problems  infection with the human immunodeficiency virus (HIV) or AIDS  low blood platelet counts  multiple sclerosis  an unusual or allergic reaction to influenza virus vaccine, eggs, chicken proteins, latex, gentamicin, other medicines, foods, dyes or preservatives  pregnant or trying to get pregnant  breast-feeding How should I use this medicine? This vaccine is for injection into a muscle. It is given by a health care professional. A copy of Vaccine Information Statements will be given before each vaccination. Read this sheet carefully each time. The sheet may change frequently. Talk to your pediatrician regarding the use of this medicine in children. Special care may be needed. Overdosage: If you think you have taken too much of this medicine contact a poison control center or emergency room at once. NOTE: This medicine is only for you. Do not share this medicine with others. What if I miss a dose? This does not apply. What may interact with this medicine?  chemotherapy or radiation therapy  medicines that lower your immune system like etanercept, anakinra, infliximab, and adalimumab  medicines that treat or prevent blood clots like warfarin  phenytoin  steroid medicines like prednisone or cortisone  theophylline  vaccines This list may not describe all possible interactions. Give your health care provider a list of all the medicines, herbs, non-prescription drugs, or dietary supplements you use. Also tell them if you smoke, drink alcohol, or use illegal drugs. Some items may interact with your medicine. What should I watch for while using this medicine? Report any side effects that do not go away within 3 days to your doctor or health care professional. Call your health care provider if any unusual symptoms occur within 6 weeks of receiving this vaccine. You may still catch the flu, but  the illness is not usually as bad. You cannot get the flu from the vaccine. The vaccine will not protect against colds or other illnesses that may cause fever. The vaccine is needed every year. What side effects may I notice from receiving this medicine? Side effects that you should report to your doctor or health care professional as soon as possible:  allergic reactions like skin rash, itching or hives, swelling of the face, lips, or tongue Side effects that usually do not require medical attention (report to your doctor or health care professional if they continue or are bothersome):  fever  headache  muscle aches and pains  pain, tenderness, redness, or swelling at site where injected  weak or tired This list may not describe all possible side effects. Call your doctor for medical advice about side effects. You may report side effects to FDA at 1-800-FDA-1088. Where should I keep my medicine? This vaccine is only given in a clinic, pharmacy, doctor's office, or other health care setting and will not be stored at home. NOTE: This sheet is a summary. It may not cover all  possible information. If you have questions about this medicine, talk to your doctor, pharmacist, or health care provider.  2020 Elsevier/Gold Standard (2007-10-13 09:30:40)

## 2019-11-26 LAB — LIPID PANEL
Chol/HDL Ratio: 2.5 ratio (ref 0.0–4.4)
Cholesterol, Total: 212 mg/dL — ABNORMAL HIGH (ref 100–199)
HDL: 85 mg/dL (ref >39)
LDL Chol Calc (NIH): 94 mg/dL (ref 0–99)
Triglycerides: 197 mg/dL — ABNORMAL HIGH (ref 0–149)
VLDL Cholesterol Cal: 33 mg/dL (ref 5–40)

## 2019-11-27 DIAGNOSIS — N3281 Overactive bladder: Secondary | ICD-10-CM | POA: Insufficient documentation

## 2019-11-27 DIAGNOSIS — M62838 Other muscle spasm: Secondary | ICD-10-CM | POA: Insufficient documentation

## 2019-11-30 ENCOUNTER — Encounter (INDEPENDENT_AMBULATORY_CARE_PROVIDER_SITE_OTHER): Payer: Self-pay

## 2019-12-06 MED FILL — LOSARTAN POTASSIUM 100 MG T: 100 | 30 days supply | Qty: 30 | Fill #2

## 2019-12-06 MED FILL — OXYBUTYNIN 5 MG TABLET: 5 | 30 days supply | Qty: 60 | Fill #0

## 2019-12-06 MED FILL — CETIRIZINE HCL 10 MG TABS: 10 | 30 days supply | Qty: 30 | Fill #1

## 2019-12-13 ENCOUNTER — Other Ambulatory Visit: Payer: Self-pay

## 2019-12-13 ENCOUNTER — Ambulatory Visit (INDEPENDENT_AMBULATORY_CARE_PROVIDER_SITE_OTHER): Payer: Medicaid Other | Admitting: Family Medicine

## 2019-12-13 ENCOUNTER — Encounter (INDEPENDENT_AMBULATORY_CARE_PROVIDER_SITE_OTHER): Payer: Self-pay | Admitting: Family Medicine

## 2019-12-13 VITALS — BP 132/84 | HR 64 | Temp 99.1°F | Ht 64.0 in | Wt 279.0 lb

## 2019-12-13 DIAGNOSIS — R5383 Other fatigue: Secondary | ICD-10-CM

## 2019-12-13 DIAGNOSIS — E1169 Type 2 diabetes mellitus with other specified complication: Secondary | ICD-10-CM | POA: Diagnosis not present

## 2019-12-13 DIAGNOSIS — Z6841 Body Mass Index (BMI) 40.0 and over, adult: Secondary | ICD-10-CM

## 2019-12-13 DIAGNOSIS — F319 Bipolar disorder, unspecified: Secondary | ICD-10-CM | POA: Diagnosis not present

## 2019-12-13 DIAGNOSIS — I10 Essential (primary) hypertension: Secondary | ICD-10-CM

## 2019-12-13 DIAGNOSIS — R0602 Shortness of breath: Secondary | ICD-10-CM

## 2019-12-13 DIAGNOSIS — E1159 Type 2 diabetes mellitus with other circulatory complications: Secondary | ICD-10-CM | POA: Diagnosis not present

## 2019-12-13 DIAGNOSIS — E785 Hyperlipidemia, unspecified: Secondary | ICD-10-CM

## 2019-12-13 DIAGNOSIS — Z0289 Encounter for other administrative examinations: Secondary | ICD-10-CM

## 2019-12-14 ENCOUNTER — Encounter: Payer: Self-pay | Admitting: Internal Medicine

## 2019-12-14 LAB — COMPREHENSIVE METABOLIC PANEL
ALT: 28 IU/L (ref 0–32)
AST: 25 IU/L (ref 0–40)
Albumin/Globulin Ratio: 1.3 (ref 1.2–2.2)
Albumin: 4.7 g/dL (ref 3.8–4.9)
Alkaline Phosphatase: 86 IU/L (ref 44–121)
BUN/Creatinine Ratio: 13 (ref 9–23)
BUN: 11 mg/dL (ref 6–24)
Bilirubin Total: 0.3 mg/dL (ref 0.0–1.2)
CO2: 22 mmol/L (ref 20–29)
Calcium: 10.2 mg/dL (ref 8.7–10.2)
Chloride: 99 mmol/L (ref 96–106)
Creatinine, Ser: 0.82 mg/dL (ref 0.57–1.00)
GFR calc Af Amer: 92 mL/min/{1.73_m2} (ref >59)
GFR calc non Af Amer: 80 mL/min/{1.73_m2} (ref >59)
Globulin, Total: 3.7 g/dL (ref 1.5–4.5)
Glucose: 83 mg/dL (ref 65–99)
Potassium: 4.6 mmol/L (ref 3.5–5.2)
Sodium: 139 mmol/L (ref 134–144)
Total Protein: 8.4 g/dL (ref 6.0–8.5)

## 2019-12-14 LAB — T4, FREE: Free T4: 1.08 ng/dL (ref 0.82–1.77)

## 2019-12-14 LAB — HEMOGLOBIN A1C
Est. average glucose Bld gHb Est-mCnc: 128 mg/dL
Hgb A1c MFr Bld: 6.1 % — ABNORMAL HIGH (ref 4.8–5.6)

## 2019-12-14 LAB — TSH: TSH: 1.61 u[IU]/mL (ref 0.450–4.500)

## 2019-12-14 LAB — T3: T3, Total: 119 ng/dL (ref 71–180)

## 2019-12-14 LAB — VITAMIN D 25 HYDROXY (VIT D DEFICIENCY, FRACTURES): Vit D, 25-Hydroxy: 15.6 ng/mL — ABNORMAL LOW (ref 30.0–100.0)

## 2019-12-14 LAB — FOLATE: Folate: 14.1 ng/mL (ref >3.0)

## 2019-12-14 LAB — INSULIN, RANDOM: INSULIN: 13.7 u[IU]/mL (ref 2.6–24.9)

## 2019-12-14 LAB — VITAMIN B12: Vitamin B-12: 981 pg/mL (ref 232–1245)

## 2019-12-15 ENCOUNTER — Other Ambulatory Visit: Payer: Self-pay | Admitting: Internal Medicine

## 2019-12-15 MED ORDER — ATORVASTATIN CALCIUM 10 MG PO TABS: 10.0000 mg | ORAL_TABLET | Freq: Every day | ORAL | 3 refills | Status: DC

## 2019-12-15 NOTE — Progress Notes (Signed)
Dear Dr. Wynetta Emery,   Thank you for referring Sabrina [name] to our clinic. The following note includes my evaluation and treatment recommendations.  Chief Complaint:   OBESITY Sabrina [name] (MR# [id number]) is a 59 y.o. female who presents for evaluation and treatment of obesity and related comorbidities. Current BMI is Body mass index is 47.89 kg/m. Sabrina [name] has been struggling with her weight for many years and has been unsuccessful in either losing weight, maintaining weight loss, or reaching her healthy weight goal.  Sabrina [name] is currently in the action stage of change and ready to dedicate time achieving and maintaining a healthier weight. Sabrina [name] is interested in becoming our patient and working on intensive lifestyle modifications including (but not limited to) diet and exercise for weight loss.  Sabrina [name] lives with her husband, Sabrina [name], who is 50 years old.  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  Waneda's habits were reviewed today and are as follows: Her family eats meals together, she thinks her family will eat healthier with her, her desired weight loss is 100 pounds, she has been heavy most of her life, she started gaining weight at age 26, her heaviest weight ever was 306 pounds, she craves sweets, she is frequently drinking liquids with calories, she has problems with excessive hunger, she frequently eats larger portions than normal and she struggles with emotional eating.  Depression Screen Sabrina [name]'s Food and Mood (modified PHQ-9) score was 10.  Depression screen PHQ 2/9 12/13/2019  Decreased Interest 1  Down, Depressed, Hopeless 1  PHQ - 2 Score 2  Altered sleeping 1  Tired, decreased energy 3  Change in appetite 2  Feeling bad or failure about yourself  1  Trouble concentrating 1  Moving slowly or fidgety/restless 0  Suicidal thoughts 0  PHQ-9 Score 10  Difficult doing work/chores -  Some recent data might be hidden   Subjective:     1. Other fatigue Sabrina [name] denies daytime somnolence and denies waking up still tired. Patent has a history of symptoms of snoring. Sabrina [name] generally gets 4-6 hours of sleep per night, and states that she has generally restful sleep. Snoring is present. Apneic episodes are not present. Epworth Sleepiness Score is 6.  2. SOB (shortness of breath) Sabrina [name] notes increasing shortness of breath with exercising and seems to be worsening over time with weight gain. She notes getting out of breath sooner with activity than she used to. This has gotten worse recently. Sabrina [name] denies shortness of breath at rest or orthopnea.  3. Type 2 diabetes mellitus with other specified complication, without long-term current use of insulin (HCC) Medications reviewed. Diabetic ROS: no polyuria or polydipsia, no chest pain, dyspnea or TIA's, no numbness, tingling or pain in extremities.  She is taking metformin 500 mg daily per her PCP. A1c is at goal.  Lab Results  Component Value Date   HGBA1C 6.1 (H) 12/13/2019   HGBA1C 5.5 11/25/2019   HGBA1C 5.5 05/16/2019   Lab Results  Component Value Date   MICROALBUR 0.2 12/07/2015   LDLCALC 94 11/25/2019   CREATININE 0.82 12/13/2019   Lab Results  Component Value Date   INSULIN 13.7 12/13/2019   4. Hyperlipidemia associated with type 2 diabetes mellitus ([place]) Sabrina [name] has hyperlipidemia and has been trying to improve her cholesterol levels with intensive lifestyle modification including a low saturated fat diet, exercise and weight loss. She denies any chest pain, claudication or myalgias.  She states she is not  on cholesterol medication now.  She thinks she was on them at some point in the past, but is not sure which one.  Her PCP recently started/recommeended she start Lipitor, but she has not picked it up.  Lab Results  Component Value Date   ALT 28 12/13/2019   AST 25 12/13/2019   ALKPHOS 86 12/13/2019   BILITOT 0.3 12/13/2019   Lab Results  Component Value  Date   CHOL 212 (H) 11/25/2019   HDL 85 11/25/2019   LDLCALC 94 11/25/2019   TRIG 197 (H) 11/25/2019   CHOLHDL 2.5 11/25/2019   5. Hypertension associated with type 2 diabetes mellitus ([place]) Review: taking medications as instructed, no medication side effects noted, no chest pain on exertion, no dyspnea on exertion, no swelling of ankles.  She is taking losartan and hydralazine.   BP Readings from Last 3 Encounters:  12/13/19 132/84  11/25/19 (!) 128/59  10/03/19 120/72   6. Bipolar depression Sabrina [name]) Her psychiatrist is at [facility], Dr. Malachi Paradise.  She sees him every 3 months for medications.  She sees a therapist at [name] every 2 weeks.  This is the patient's first visit at [facility].  The patient's NEW PATIENT PACKET that they filled out prior to today's office visit was reviewed at length and some information from that paperwork was also included within the following office visit note.    Included in the packet: current and past health history, medications, allergies, ROS, gynecologic history (women only), surgical history, family history, social history, weight history, weight loss surgery history (for those that have had weight loss surgery), nutritional evaluation, mood and food questionnaire along with a depression screening (PHQ9) on all patients, an Epworth questionnaire, sleep habits questionnaire, patient life and health improvement goals questionnaire. These will all be scanned into the patient's chart under media.   During the visit, I independently reviewed the patient's EKG, bioimpedance scale results, and indirect calorimeter results. I used this information to tailor a meal plan for the patient that will help Kaleia to lose weight and will improve her obesity-related conditions going forward. I performed a medically necessary appropriate examination and/or evaluation. I discussed the assessment and treatment plan with the patient. The patient was provided  an opportunity to ask questions and all were answered. The patient agreed with the plan and demonstrated an understanding of the instructions. Labs were ordered at this visit and will be reviewed at the next visit unless more critical results need to be addressed immediately. Clinical information was updated and documented in the EMR.   Time spent on visit including pre-visit chart review and post-visit care was estimated to be 60-74 minutes.    Assessment/Plan:   1. Other fatigue Sabrina [name] does feel that her weight is causing her energy to be lower than it should be. Fatigue may be related to obesity, depression or many other causes. Labs will be ordered, and in the meanwhile, Sabrina [name] will focus on self care including making healthy food choices, increasing physical activity and focusing on stress reduction.  - Vitamin B12 - T3 - T4, free - TSH - VITAMIN D 25 Hydroxy (Vit-D Deficiency, Fractures) - Folate  2. SOB (shortness of breath) Sabrina [name] does feel that she gets out of breath more easily that she used to when she exercises. Sabrina [name]'s shortness of breath appears to be obesity related and exercise induced. She has agreed to work on weight loss and gradually increase exercise to treat her exercise induced shortness of breath.  Will continue to monitor closely.  - Insulin, random - Folate  3. Type 2 diabetes mellitus with other specified complication, without long-term current use of insulin (HCC) Good blood sugar control is important to decrease the likelihood of diabetic complications such as nephropathy, neuropathy, limb loss, blindness, coronary artery disease, and death. Intensive lifestyle modification including diet, exercise and weight loss are the first line of treatment for diabetes.  Check fasting insulin as well as other labs.  She is to continue metformin and continue monitoring daily as PCP has directed.  Weight loss, prudent nutritional plan.  - T3 - T4, free - TSH - VITAMIN D  25 Hydroxy (Vit-D Deficiency, Fractures) - Insulin, random - Comprehensive metabolic panel  4. Hyperlipidemia associated with type 2 diabetes mellitus ([place]) Cardiovascular risk and specific lipid/LDL goals reviewed.  We discussed several lifestyle modifications today and Sabrina [name] will continue to work on diet, exercise and weight loss efforts. Orders and follow up as documented in patient record.  I told her to contact her PCP regarding obtaining medication for cholesterol and engage in weight loss and prudent nutritional plan.  Counseling Intensive lifestyle modifications are the first line treatment for this issue. . Dietary changes: Increase soluble fiber. Decrease simple carbohydrates. . Exercise changes: Moderate to vigorous-intensity aerobic activity 150 minutes per week if tolerated. . Lipid-lowering medications: see documented in medical record.  - Comprehensive metabolic panel  5. Hypertension associated with type 2 diabetes mellitus ([place]) Sabrina [name] is working on healthy weight loss and exercise to improve blood pressure control. We will watch for signs of hypotension as she continues her lifestyle modifications.  Blood pressure is at goal.  Continue ARB and blood pressure medication per PCP.  Check labs, continue home blood pressure monitoring and weight loss/prudent nutritional plan to help control weight.  - T3 - T4, free - TSH - Insulin, random - Comprehensive metabolic panel  6. Bipolar depression (Sabrina [name]) Continue medications per psychiatrist and therapy per psychologist.  Prudent nutritional plan importance discussed with her as eating healthy will help mood.  She will sleep better with prudent nutritional plan and weight loss, etc.  - Vitamin B12 - T3 - T4, free - TSH - VITAMIN D 25 Hydroxy (Vit-D Deficiency, Fractures) - Folate - Comprehensive metabolic panel  7. Class 3 severe obesity with serious comorbidity and body mass index (BMI) of 45.0 to 49.9 in adult,  unspecified obesity type [facility]) Sabrina [name] is currently in the action stage of change and her goal is to continue with weight loss efforts. I recommend Sabrina [name] begin the structured treatment plan as follows:  She has agreed to the Category 2 Plan.  Exercise goals: As is.   Behavioral modification strategies: increasing lean protein intake, decreasing simple carbohydrates, decreasing liquid calories, decreasing eating out, meal planning and cooking strategies, keeping healthy foods in the home and planning for success.  She was informed of the importance of frequent follow-up visits to maximize her success with intensive lifestyle modifications for her multiple health conditions. She was informed we would discuss her lab results at her next visit unless there is a critical issue that needs to be addressed sooner. Sabrina [name] agreed to keep her next visit at the agreed upon time to discuss these results.  Objective:   Blood pressure 132/84, pulse 64, temperature 99.1 F (37.3 C), height 5\' 4"  (1.626 m), weight 279 lb (126.6 kg), SpO2 100 %. Body mass index is 47.89 kg/m.  Indirect Calorimeter completed today shows a VO2 of 230 and a  REE of 1602.  Her calculated basal metabolic rate is 9935 thus her basal metabolic rate is worse than expected.  General: Cooperative, alert, well developed, in no acute distress. HEENT: Conjunctivae and lids unremarkable. Cardiovascular: Regular rhythm.  Lungs: Normal work of breathing. Neurologic: No focal deficits.   Lab Results  Component Value Date   CREATININE 0.82 12/13/2019   BUN 11 12/13/2019   NA 139 12/13/2019   K 4.6 12/13/2019   CL 99 12/13/2019   CO2 22 12/13/2019   Lab Results  Component Value Date   ALT 28 12/13/2019   AST 25 12/13/2019   ALKPHOS 86 12/13/2019   BILITOT 0.3 12/13/2019   Lab Results  Component Value Date   HGBA1C 6.1 (H) 12/13/2019   HGBA1C 5.5 11/25/2019   HGBA1C 5.5 05/16/2019   HGBA1C 5.5 09/06/2018   HGBA1C 5.7  03/15/2018   Lab Results  Component Value Date   INSULIN 13.7 12/13/2019   Lab Results  Component Value Date   TSH 1.610 12/13/2019   Lab Results  Component Value Date   CHOL 212 (H) 11/25/2019   HDL 85 11/25/2019   LDLCALC 94 11/25/2019   TRIG 197 (H) 11/25/2019   CHOLHDL 2.5 11/25/2019   Lab Results  Component Value Date   WBC 6.1 10/03/2019   HGB 12.6 10/03/2019   HCT 37.9 10/03/2019   MCV 97.4 10/03/2019   PLT 354 10/03/2019   Attestation Statements:   Reviewed by clinician on day of visit: allergies, medications, problem list, medical history, surgical history, family history, social history, and previous encounter notes.  I, Water quality scientist, CMA, am acting as Location manager for [employer], DO.  I have reviewed the above documentation for accuracy and completeness, and I agree with the above. Mellody Dance, DO

## 2019-12-16 MED FILL — ATORVASTATIN 10 MG TABLET: 10 | 30 days supply | Qty: 30 | Fill #0

## 2019-12-19 ENCOUNTER — Encounter (INDEPENDENT_AMBULATORY_CARE_PROVIDER_SITE_OTHER): Payer: Self-pay | Admitting: Family Medicine

## 2019-12-19 MED FILL — METFORMIN HCL 500 MG TABS: 500 | 30 days supply | Qty: 30 | Fill #1

## 2019-12-20 NOTE — Telephone Encounter (Signed)
Please review and advise.

## 2019-12-27 ENCOUNTER — Ambulatory Visit (INDEPENDENT_AMBULATORY_CARE_PROVIDER_SITE_OTHER): Payer: Medicaid Other | Admitting: Family Medicine

## 2019-12-27 ENCOUNTER — Encounter (INDEPENDENT_AMBULATORY_CARE_PROVIDER_SITE_OTHER): Payer: Self-pay | Admitting: Family Medicine

## 2019-12-27 ENCOUNTER — Other Ambulatory Visit: Payer: Self-pay

## 2019-12-27 VITALS — BP 134/84 | HR 60 | Temp 98.7°F | Ht 64.0 in

## 2019-12-27 DIAGNOSIS — E559 Vitamin D deficiency, unspecified: Secondary | ICD-10-CM

## 2019-12-27 DIAGNOSIS — Z6841 Body Mass Index (BMI) 40.0 and over, adult: Secondary | ICD-10-CM

## 2019-12-27 DIAGNOSIS — E1169 Type 2 diabetes mellitus with other specified complication: Secondary | ICD-10-CM | POA: Diagnosis not present

## 2019-12-27 DIAGNOSIS — I152 Hypertension secondary to endocrine disorders: Secondary | ICD-10-CM

## 2019-12-27 DIAGNOSIS — I10 Essential (primary) hypertension: Secondary | ICD-10-CM

## 2019-12-27 DIAGNOSIS — E785 Hyperlipidemia, unspecified: Secondary | ICD-10-CM

## 2019-12-27 DIAGNOSIS — E1159 Type 2 diabetes mellitus with other circulatory complications: Secondary | ICD-10-CM

## 2019-12-27 DIAGNOSIS — Z9189 Other specified personal risk factors, not elsewhere classified: Secondary | ICD-10-CM | POA: Diagnosis not present

## 2019-12-27 DIAGNOSIS — E66813 Obesity, class 3: Secondary | ICD-10-CM

## 2019-12-27 MED ORDER — VITAMIN D (ERGOCALCIFEROL) 1.25 MG (50000 UNIT) PO CAPS: 50000.0000 [IU] | ORAL_CAPSULE | ORAL | 0 refills | Status: DC

## 2019-12-27 MED FILL — VIT D2 1.25 MG (50,000 UNIT: 1.25 MG | 28 days supply | Qty: 4 | Fill #0

## 2019-12-27 NOTE — Patient Instructions (Signed)
The 10-year ASCVD risk score Mikey Bussing DC Brooke Bonito., et al., 2013) is: 12.3%   Values used to calculate the score:     Age: 59 years     Sex: Female     Is Non-Hispanic African American: Yes     Diabetic: Yes     Tobacco smoker: No     Systolic Blood Pressure: 114 mmHg     Is BP treated: Yes     HDL Cholesterol: 85 mg/dL     Total Cholesterol: 212 mg/dL

## 2019-12-29 NOTE — Progress Notes (Signed)
Chief Complaint:   OBESITY Sabrina [name] is here to discuss her progress with her obesity treatment plan along with follow-up of her obesity related diagnoses. Sabrina [name] is on the Category 2 Plan and states Sabrina [name] is following her eating plan approximately 0% of the time. Sabrina [name] states Sabrina [name] is exercising for 0 minutes 0 times per week.  Today's visit was #: 2 Starting weight: 279 lbs Starting date: 12/13/2019 Today's weight: 278 lbs Today's date: 12/27/2019 Total lbs lost to date: 1 lb Total lbs lost since last in-office visit: 1 lb  Interim History: Sabrina [name] misunderstood and thought Sabrina [name] did not have to eat all the foods on the plan.  Sabrina [name] is only eating 1 yogurt in the morning and 1 small piece of fruit.  Sabrina [name] also was taking her Lipitor every morning and not at night, which likely contributed to some symptoms Sabrina [name] had on 12/19/2019.  Sabrina [name] also had birthday cake and recent travel - eating out at [employer].  Sabrina [name] ate the salad, etc.  Sabrina [name] ate out several times with recent travel and had bacon, etc.  Subjective:   1. Type 2 diabetes mellitus with other specified complication, without long-term current use of insulin (HCC) Medications reviewed. Diabetic ROS: no polyuria or polydipsia, no chest pain, dyspnea or TIA's, no numbness, tingling or pain in extremities.  Sabrina [name] is on metformin and says Sabrina [name] takes it "most days".  A1c is 6.1, up from prior.  Sabrina [name] endorses some carb cravings.  Lab Results  Component Value Date   HGBA1C 6.1 (H) 12/13/2019   HGBA1C 5.5 11/25/2019   HGBA1C 5.5 05/16/2019   Lab Results  Component Value Date   MICROALBUR 0.2 12/07/2015   LDLCALC 94 11/25/2019   CREATININE 0.82 12/13/2019   Lab Results  Component Value Date   INSULIN 13.7 12/13/2019   2. Hypertension associated with type 2 diabetes mellitus ([place]) Review: taking medications as instructed, no medication side effects noted, no chest pain on exertion, no dyspnea on exertion, no swelling of ankles.  Sabrina [name] is on losartan,  hydralazine.  Denies side effects or concerns.  Blood pressure is at goal today.  No issues.  BP Readings from Last 3 Encounters:  12/27/19 134/84  12/13/19 132/84  11/25/19 (!) 128/59   3. Hyperlipidemia associated with type 2 diabetes mellitus ([place]) Allina has hyperlipidemia and has been trying to improve her cholesterol levels with intensive lifestyle modification including a low saturated fat diet, exercise and weight loss. Sabrina [name] denies any chest pain, claudication or myalgias.  On Lipitor.  Taking every morning and not at night, as I mentioned.  Sabrina [name] was started on cholesterol medications by her PCP 3-4 days after our last visit based on labs they did on 11/25/2019 and on our recommendations to be started on statin due to diabetes.  The 10-year ASCVD risk score Sabrina [name] DC Sabrina [name]., et al., 2013) is: 12.3%   Values used to calculate the score:     Age: 70 years     Sex: Female     Is Non-Hispanic African American: Yes     Diabetic: Yes     Tobacco smoker: No     Systolic Blood Pressure: 585 mmHg     Is BP treated: Yes     HDL Cholesterol: 85 mg/dL     Total Cholesterol: 212 mg/dL  Lab Results  Component Value Date   ALT 28 12/13/2019   AST 25 12/13/2019   ALKPHOS 86 12/13/2019   BILITOT 0.3 12/13/2019  Lab Results  Component Value Date   CHOL 212 (H) 11/25/2019   HDL 85 11/25/2019   LDLCALC 94 11/25/2019   TRIG 197 (H) 11/25/2019   CHOLHDL 2.5 11/25/2019   4. Vitamin D deficiency Berdene's Vitamin D level was 15.6 on 12/13/2019. Sabrina [name] is currently taking no vitamin D supplement. Sabrina [name] denies nausea, vomiting or muscle weakness.  Sabrina [name] endorses fatigue, achy muscles at times.  5. At risk for heart disease Due to Gu[place]upe's current state of health and medical condition(s), Sabrina [name] is at a higher risk for heart disease.   This puts the patient at much greater risk to subsequently develop cardiopulmonary conditions that can significantly affect patient's quality of life in a negative manner as well.     At least 15+ minutes was spent on counseling Xian about these concerns today and I stressed the importance of reversing risks factors of obesity, esp truncal and visceral fat, hypertension, hyperlipidemia, pre-diabetes.   Initial goal is to lose at least 5-10% of starting weight to help reduce these risk factors.   Counseling: Intensive lifestyle modifications discussed with Sabrina [name] as most appropriate first line treatment.  Sabrina [name] will continue to work on diet, exercise and weight loss efforts.  We will continue to reassess these conditions on a fairly regular basis in an attempt to decrease patient's overall morbidity and mortality.  Evidence-based interventions for health behavior change were utilized today including the discussion of self monitoring techniques, problem-solving barriers and SMART goal setting techniques.  Specifically regarding patient's less desirable eating habits and patterns, we employed the technique of small changes when Sabrina [name] has not been able to fully commit to her prudent nutritional plan.  Assessment/Plan:   1. Type 2 diabetes mellitus with other specified complication, without long-term current use of insulin ([place]) Discussed labs with patient today.  Good blood sugar control is important to decrease the likelihood of diabetic complications such as nephropathy, neuropathy, limb loss, blindness, coronary artery disease, and death. Intensive lifestyle modification including diet, exercise and weight loss are the first line of treatment for diabetes.  Need to follow meal plan, decrease simple carbs, increase proteins.  Weight loss, continue medications at current dose for now.   2. Hypertension associated with type 2 diabetes mellitus ([place]) Discussed labs with patient today.  Sabrina [name] is working on healthy weight loss and exercise to improve blood pressure control. We will watch for signs of hypotension as Sabrina [name] continues her lifestyle modifications. Continue medications as  written.  Will closely monitor as Sabrina [name] loses weight.  Low salt meal plan, which Sabrina [name] has.   3. Hyperlipidemia associated with type 2 diabetes mellitus (HCC) Worsening.  Discussed labs with patient today.  Cardiovascular risk and specific lipid/LDL goals reviewed.  We discussed several lifestyle modifications today and Darci will continue to work on diet, exercise and weight loss efforts. Orders and follow up as documented in patient record.  Need recheck FLP and ALT in around 2 months.  Follow meal plan of low sat/trans fat diet.  Continue to monitor.  Continue Lipitor which pt recently started.   Counseling Intensive lifestyle modifications are the first line treatment for this issue. . Dietary changes: Increase soluble fiber. Decrease simple carbohydrates. . Exercise changes: Moderate to vigorous-intensity aerobic activity 150 minutes per week if tolerated. . Lipid-lowering medications: see documented in medical record.   4. Vitamin D deficiency New.  Discussed labs with patient today.  Low Vitamin D level contributes to fatigue and are associated with obesity, breast, and colon cancer.  Sabrina [name] agrees to start to take prescription Vitamin D @50 ,000 IU every week and will follow-up for routine testing of Vitamin D, at least 2-3 times per year to avoid over-replacement.  Recheck levels in 2-3 months.  -Start Vitamin D, Ergocalciferol, (DRISDOL) 1.25 MG (50000 UNIT) CAPS capsule; Take 1 capsule (50,000 Units total) by mouth every 7 (seven) days.  Dispense: 4 capsule; Refill: 0   5. At risk for heart disease Due to [place]'s current state of health and medical condition(s), Sabrina [name] is at a higher risk for heart disease.   This puts the patient at much greater risk to subsequently develop cardiopulmonary conditions that can significantly affect patient's quality of life in a negative manner as well.    At least 15+ minutes was spent on counseling Jackee about these concerns today and I stressed the  importance of reversing risks factors of obesity, esp truncal and visceral fat, hypertension, hyperlipidemia, pre-diabetes.   Initial goal is to lose at least 5-10% of starting weight to help reduce these risk factors.   Counseling: Intensive lifestyle modifications discussed with [place] as most appropriate first line treatment.  Sabrina [name] will continue to work on diet, exercise and weight loss efforts.  We will continue to reassess these conditions on a fairly regular basis in an attempt to decrease patient's overall morbidity and mortality.  Evidence-based interventions for health behavior change were utilized today including the discussion of self monitoring techniques, problem-solving barriers and SMART goal setting techniques.  Specifically regarding patient's less desirable eating habits and patterns, we employed the technique of small changes when Kaylor has not been able to fully commit to her prudent nutritional plan.   6. Class 3 severe obesity with serious comorbidity and body mass index (BMI) of 45.0 to 49.9 in adult, unspecified obesity type [facility])  Ev[place]na is currently in the action stage of change. As such, her goal is to continue with weight loss efforts. Sabrina [name] has agreed to the Category 2 Plan.   Exercise goals: As is.  Behavioral modification strategies: increasing lean protein intake, decreasing simple carbohydrates, increasing vegetables, no skipping meals, meal planning and cooking strategies, keeping healthy foods in the home, better snacking choices and planning for success.  Brynlei has agreed to follow-up with our clinic in 2 weeks. Sabrina [name] was informed of the importance of frequent follow-up visits to maximize her success with intensive lifestyle modifications for her multiple health conditions.    Objective:   Blood pressure 134/84, pulse 60, temperature 98.7 F (37.1 C), height 5\' 4"  (1.626 m), SpO2 98 %. Body mass index is 47.89 kg/m.  General: Cooperative, alert, well developed,  in no acute distress. HEENT: Conjunctivae and lids unremarkable. Cardiovascular: Regular rhythm.  Lungs: Normal work of breathing. Neurologic: No focal deficits.   Lab Results  Component Value Date   CREATININE 0.82 12/13/2019   BUN 11 12/13/2019   NA 139 12/13/2019   K 4.6 12/13/2019   CL 99 12/13/2019   CO2 22 12/13/2019   Lab Results  Component Value Date   ALT 28 12/13/2019   AST 25 12/13/2019   ALKPHOS 86 12/13/2019   BILITOT 0.3 12/13/2019   Lab Results  Component Value Date   HGBA1C 6.1 (H) 12/13/2019   HGBA1C 5.5 11/25/2019   HGBA1C 5.5 05/16/2019   HGBA1C 5.5 09/06/2018   HGBA1C 5.7 03/15/2018   Lab Results  Component Value Date   INSULIN 13.7 12/13/2019   Lab Results  Component Value Date   TSH 1.610 12/13/2019  Lab Results  Component Value Date   CHOL 212 (H) 11/25/2019   HDL 85 11/25/2019   LDLCALC 94 11/25/2019   TRIG 197 (H) 11/25/2019   CHOLHDL 2.5 11/25/2019   Lab Results  Component Value Date   WBC 6.1 10/03/2019   HGB 12.6 10/03/2019   HCT 37.9 10/03/2019   MCV 97.4 10/03/2019   PLT 354 10/03/2019   Attestation Statements:   Reviewed by clinician on day of visit: allergies, medications, problem list, medical history, surgical history, family history, social history, and previous encounter notes.  I, Water quality scientist, CMA, am acting as Location manager for [employer], DO.  I have reviewed the above documentation for accuracy and completeness, and I agree with the above. Mellody Dance, DO

## 2020-01-04 ENCOUNTER — Telehealth: Payer: Self-pay

## 2020-01-04 ENCOUNTER — Encounter (HOSPITAL_COMMUNITY): Payer: Self-pay

## 2020-01-04 ENCOUNTER — Emergency Department (HOSPITAL_COMMUNITY)
Admission: EM | Admit: 2020-01-04 | Discharge: 2020-01-04 | Disposition: A | Discharge status: Home / Self Care | Payer: Medicaid Other | Attending: Emergency Medicine | Admitting: Emergency Medicine

## 2020-01-04 ENCOUNTER — Other Ambulatory Visit: Payer: Self-pay

## 2020-01-04 ENCOUNTER — Ambulatory Visit: Payer: Self-pay | Admitting: *Deleted

## 2020-01-04 ENCOUNTER — Other Ambulatory Visit (HOSPITAL_COMMUNITY): Payer: Self-pay | Admitting: Student

## 2020-01-04 DIAGNOSIS — Z2914 Encounter for prophylactic rabies immune globin: Secondary | ICD-10-CM | POA: Insufficient documentation

## 2020-01-04 DIAGNOSIS — W5501XA Bitten by cat, initial encounter: Secondary | ICD-10-CM | POA: Diagnosis not present

## 2020-01-04 DIAGNOSIS — I10 Essential (primary) hypertension: Secondary | ICD-10-CM | POA: Diagnosis not present

## 2020-01-04 DIAGNOSIS — Z23 Encounter for immunization: Secondary | ICD-10-CM | POA: Diagnosis not present

## 2020-01-04 DIAGNOSIS — Z79899 Other long term (current) drug therapy: Secondary | ICD-10-CM | POA: Diagnosis not present

## 2020-01-04 DIAGNOSIS — Z7722 Contact with and (suspected) exposure to environmental tobacco smoke (acute) (chronic): Secondary | ICD-10-CM | POA: Diagnosis not present

## 2020-01-04 DIAGNOSIS — S61251A Open bite of left index finger without damage to nail, initial encounter: Secondary | ICD-10-CM | POA: Diagnosis present

## 2020-01-04 DIAGNOSIS — Z7984 Long term (current) use of oral hypoglycemic drugs: Secondary | ICD-10-CM | POA: Diagnosis not present

## 2020-01-04 DIAGNOSIS — Z91040 Latex allergy status: Secondary | ICD-10-CM | POA: Diagnosis not present

## 2020-01-04 DIAGNOSIS — E119 Type 2 diabetes mellitus without complications: Secondary | ICD-10-CM | POA: Insufficient documentation

## 2020-01-04 DIAGNOSIS — Z7982 Long term (current) use of aspirin: Secondary | ICD-10-CM | POA: Insufficient documentation

## 2020-01-04 MED ORDER — RABIES IMMUNE GLOBULIN 150 UNIT/ML IM INJ
2550.0000 [IU] | INJECTION | Freq: Once | INTRAMUSCULAR | Status: AC
  Administered 2020-01-04: 2550 [IU] via INTRAMUSCULAR
  Filled 2020-01-04: qty 17

## 2020-01-04 MED ORDER — RABIES VACCINE, PCEC IM SUSR
1.0000 mL | Freq: Once | INTRAMUSCULAR | Status: AC
  Administered 2020-01-04: 1 mL via INTRAMUSCULAR
  Filled 2020-01-04: qty 1

## 2020-01-04 MED ORDER — MOXIFLOXACIN HCL 400 MG PO TABS: 400.0000 mg | ORAL_TABLET | Freq: Every day | ORAL | 0 refills | Status: DC

## 2020-01-04 MED FILL — MOXIFLOXACIN HCL 400 MG TAB: 400 | 7 days supply | Qty: 7 | Fill #0

## 2020-01-04 NOTE — Telephone Encounter (Signed)
Pt RC from RN and LM on nurse line, I RC to pt for triage follow-up and to give info for mobile bus in case of need for evaluation, LM w/ details for pt

## 2020-01-04 NOTE — Telephone Encounter (Signed)
Patient called stating she was bitten by a cat on 01/03/20; the pt says she is diabetic; she was bitten by a stray cat on her left index finger; she bitten when she tried to pick the cat up; she said the area bled profusely initially; she cleaned the area with soap and water and applied a Band-Aid; the area is no longer bleeding; recommendations made per nurse triage protocol; she verbalized understanding; the pt is seen by Dr Karle Plumber, [place]; will route to office for notification.  Reason for Disposition  [1] Any break in skin from BITE (e.g., cut, puncture or scratch) AND[2] PET animial (e.g., dog, cat, or ferret) at risk for RABIES (e.g., sick, stray, unprovoked bite, developing country)  Answer Assessment - Initial Assessment Questions 1. ANIMAL: "What type of animal caused the bite?" "Is the injury from a bite or a claw?" If the animal is a dog or a cat, ask: "Was it a pet or a stray?" "Was it acting ill or behaving strangely?"     Stray cat 2. LOCATION: "Where is the bite located?"      Left index finger 3. SIZE: "How big is the bite?" "What does it look like?"      1/8-1/4 inch puncture; area is blue/brown 4. ONSET: "When did the bite happen?" (Minutes or hours ago)      01/03/20 2215 5. CIRCUMSTANCES: "Tell me how this happened."     Patient tried to pick up cat 6. TETANUS: "When was the last tetanus booster?"     Not sure; within the last 5 years 7. PREGNANCY: "Is there any chance you are pregnant?" "When was your last menstrual period?"     No menopause  Protocols used: ANIMAL BITE-A-AH

## 2020-01-04 NOTE — Discharge Instructions (Signed)
Take antibiotics as prescribed.  Take the entire course, even if your symptoms improve. Use Tylenol or ibuprofen as needed for pain. Wash daily with soap and water. Follow-up at the urgent care at the schedule see below for further rabies vaccine. Return to the emergency room if you develop fevers, severe worsening pain, thick white pus draining from the area, streaking regularly of your hand, or any new, worsening, or concerning symptoms.
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
## 2020-01-04 NOTE — Telephone Encounter (Signed)
Called pt, unable to reach , advised to call back the clinic or refer to walk in appt to the mobil  unit/ Location , name and contact to call back stated.

## 2020-01-04 NOTE — ED Triage Notes (Signed)
Pt arrived via walk in, c/o cat bite to left hand, pointer finger. Cat was not pts, unsure on vaccination status. Pt washed and cleaned area last night when incident occurred.

## 2020-01-06 NOTE — Telephone Encounter (Signed)
Spoke to patient. She stated she was at work and was not able to talk.   She admitted that site is improving, still tender and brownish red.  Denies site being warm to touch or discharge. Advised patient to f/u, call office when she could talk longer.   Pt verbalized understanding.

## 2020-01-09 ENCOUNTER — Other Ambulatory Visit: Payer: Self-pay | Admitting: Internal Medicine

## 2020-01-09 DIAGNOSIS — R059 Cough, unspecified: Secondary | ICD-10-CM

## 2020-01-09 MED FILL — CETIRIZINE HCL 10 MG TABS: 10 | 30 days supply | Qty: 30 | Fill #0

## 2020-01-10 ENCOUNTER — Ambulatory Visit (INDEPENDENT_AMBULATORY_CARE_PROVIDER_SITE_OTHER): Payer: Medicaid Other | Admitting: Family Medicine

## 2020-01-10 ENCOUNTER — Other Ambulatory Visit: Payer: Self-pay

## 2020-01-10 ENCOUNTER — Encounter (INDEPENDENT_AMBULATORY_CARE_PROVIDER_SITE_OTHER): Payer: Self-pay | Admitting: Family Medicine

## 2020-01-10 VITALS — BP 123/82 | HR 60 | Temp 98.1°F | Ht 64.0 in | Wt 269.0 lb

## 2020-01-10 DIAGNOSIS — E1159 Type 2 diabetes mellitus with other circulatory complications: Secondary | ICD-10-CM | POA: Diagnosis not present

## 2020-01-10 DIAGNOSIS — I152 Hypertension secondary to endocrine disorders: Secondary | ICD-10-CM

## 2020-01-10 DIAGNOSIS — E1169 Type 2 diabetes mellitus with other specified complication: Secondary | ICD-10-CM

## 2020-01-10 DIAGNOSIS — Z6841 Body Mass Index (BMI) 40.0 and over, adult: Secondary | ICD-10-CM

## 2020-01-10 DIAGNOSIS — E559 Vitamin D deficiency, unspecified: Secondary | ICD-10-CM | POA: Diagnosis not present

## 2020-01-10 DIAGNOSIS — E785 Hyperlipidemia, unspecified: Secondary | ICD-10-CM

## 2020-01-10 MED ORDER — VITAMIN D (ERGOCALCIFEROL) 1.25 MG (50000 UNIT) PO CAPS: 50000.0000 [IU] | ORAL_CAPSULE | ORAL | 0 refills | Status: DC

## 2020-01-11 NOTE — Progress Notes (Signed)
Chief Complaint:   OBESITY Sabrina [name] is here to discuss her progress with her obesity treatment plan along with follow-up of her obesity related diagnoses. Sabrina [name] is on the Category 2 Plan and states she is following her eating plan approximately 80-85% of the time. Sabrina [name] states she is exercising for 0 minutes 0 times per week.  Today's visit was #: 3 Starting weight: 279 lbs Starting date: 12/13/2019 Today's weight: 269 lbs Today's date: 01/10/2020 Total lbs lost to date: 10 lbs Total lbs lost since last in-office visit: 9 lbs  Interim History:  Sabrina [name] says that she and her husband go out on Friday nights.  For breakfast, she has been hitting her goals and foods.  For lunch, most of the time she has been getting it in, but falls short on the amount of protein she is eating.  For dinner, she still has questions about what vegetables she can and cannot have, or what fruits are acceptable etc, etc.  Plan:  All of her questions about her plan were answered.  Counseling was done regarding soups and meal prep and recipes were given.  I counseled her on using bone broth as a base and adding low-carb vegetables and a lean meat with spices as an option.    Assessment/Plan:   1. Hypertension associated with type 2 diabetes mellitus ([place]) She is taking hydralazine, losartan/ARB.  Denies symptoms or concerns.  Plan:  Blood pressure is at goal today on ARB.  I encouraged home blood pressure monitoring and close follow-up with her weight loss.  I educated her that a decrease in weight can cause a drop in blood pressure.  2. Vitamin D deficiency Sabrina [name] has a history of Vitamin D deficiency with resultant generalized fatigue as her primary symptom.  she is taking vitamin D 50,000 IU weekly for this deficiency and tolerating it well without side-effect.   Most recent Vitamin D lab reviewed-  level: 15.6.  Plan:   - Discussed importance of vitamin D (as well as calcium) to their  health and well-being.   - We reviewed possible symptoms of low Vitamin D including low energy, depressed mood, muscle aches, joint aches, osteoporosis etc.  - We discussed that low Vitamin D levels may be linked to an increased risk of cardiovascular events and even increased risk of cancers- such as colon and breast.   - Educated pt that weight loss will likely improve availability of vitamin D, thus encouraged Sabrina [name] to continue with meal plan and their weight loss efforts to further improve this condition  - I recommend pt take a weekly prescription vit D- see script below- which pt agrees to after discussion of risks and benefits of this medication.      - Informed patient this may be a lifelong thing, and she was encouraged to continue to take the medicine until pt told otherwise.   We will need to monitor levels regularly ( q 3-4 mo on average )  to keep levels within normal limits.   - All pt's questions and concerns regarding this condition addressed  -Refill Vitamin D, Ergocalciferol, (DRISDOL) 1.25 MG (50000 UNIT) CAPS capsule; Take 1 capsule (50,000 Units total) by mouth every 7 (seven) days.  Dispense: 4 capsule; Refill: 0  3. Hyperlipidemia associated with type 2 diabetes mellitus ([place]) She started a cholesterol medication around the end of September.  She is tolerating Lipitor well without side effects.  Takes QHS.  Plan:  Will need ALT/CMP and  FLP repeated around 11/20/22021 or so, along with other labs (A1c, insulin, vitamin D) for her other chronic conditions.  Continue to decrease fatty meals and other foods with saturated fats and follow meal plan.  4. Class 3 severe obesity with serious comorbidity and body mass index (BMI) of 45.0 to 49.9 in adult, unspecified obesity type [facility])  Sabrina [name] is currently in the action stage of change. As such, her goal is to continue with weight loss efforts. She has agreed to the Category 2 Plan.   Exercise goals: As is.  Behavioral  modification strategies: increasing lean protein intake, decreasing simple carbohydrates, increasing water intake, decreasing liquid calories, decreasing eating out, eating out strategies, meal planning and cooking strategies and planning for success.  Sabrina [name] has agreed to follow-up with our clinic in 2-3 weeks. She was informed of the importance of frequent follow-up visits to maximize her success with intensive lifestyle modifications for her multiple health conditions.   Objective:   Blood pressure 123/82, pulse 60, temperature 98.1 F (36.7 C), height 5\' 4"  (1.626 m), weight 269 lb (122 kg), SpO2 100 %. Body mass index is 46.17 kg/m.  General: Cooperative, alert, well developed, in no acute distress. HEENT: Conjunctivae and lids unremarkable. Cardiovascular: Regular rhythm.  Lungs: Normal work of breathing. Neurologic: No focal deficits.   Lab Results  Component Value Date   CREATININE 0.82 12/13/2019   BUN 11 12/13/2019   NA 139 12/13/2019   K 4.6 12/13/2019   CL 99 12/13/2019   CO2 22 12/13/2019   Lab Results  Component Value Date   ALT 28 12/13/2019   AST 25 12/13/2019   ALKPHOS 86 12/13/2019   BILITOT 0.3 12/13/2019   Lab Results  Component Value Date   HGBA1C 6.1 (H) 12/13/2019   HGBA1C 5.5 11/25/2019   HGBA1C 5.5 05/16/2019   HGBA1C 5.5 09/06/2018   HGBA1C 5.7 03/15/2018   Lab Results  Component Value Date   INSULIN 13.7 12/13/2019   Lab Results  Component Value Date   TSH 1.610 12/13/2019   Lab Results  Component Value Date   CHOL 212 (H) 11/25/2019   HDL 85 11/25/2019   LDLCALC 94 11/25/2019   TRIG 197 (H) 11/25/2019   CHOLHDL 2.5 11/25/2019   Lab Results  Component Value Date   WBC 6.1 10/03/2019   HGB 12.6 10/03/2019   HCT 37.9 10/03/2019   MCV 97.4 10/03/2019   PLT 354 10/03/2019   Attestation Statements:   Reviewed by clinician on day of visit: allergies, medications, problem list, medical history, surgical history, family history,  social history, and previous encounter notes.  I, Water quality scientist, CMA, am acting as Location manager for [employer], DO.  I have reviewed the above documentation for accuracy and completeness, and I agree with the above. -  *Marjory Sneddon, D.O.  The [place] was signed into law in 2016 which includes the topic of electronic health records.  This provides immediate access to information in MyChart.  This includes consultation notes, operative notes, office notes, lab results and pathology reports.  If you have any questions about what you read please let us know at your next visit so we can discuss your concerns and take corrective action if need be.  We are right here with you.

## 2020-01-12 ENCOUNTER — Ambulatory Visit (HOSPITAL_COMMUNITY)
Admission: EM | Admit: 2020-01-12 | Discharge: 2020-01-12 | Disposition: A | Discharge status: Home / Self Care | Payer: Medicaid Other | Attending: Family Medicine | Admitting: Family Medicine

## 2020-01-12 ENCOUNTER — Other Ambulatory Visit: Payer: Self-pay

## 2020-01-12 DIAGNOSIS — Z23 Encounter for immunization: Secondary | ICD-10-CM

## 2020-01-12 LAB — HM DIABETES EYE EXAM

## 2020-01-12 MED ORDER — RABIES VACCINE, PCEC IM SUSR
1.0000 mL | Freq: Once | INTRAMUSCULAR | Status: AC
  Administered 2020-01-12: 1 mL via INTRAMUSCULAR

## 2020-01-12 MED ORDER — RABIES VACCINE, PCEC IM SUSR
INTRAMUSCULAR | Status: AC
  Filled 2020-01-12: qty 1

## 2020-01-12 NOTE — ED Triage Notes (Signed)
Pt presents for second rabies vaccine.  

## 2020-01-16 ENCOUNTER — Other Ambulatory Visit: Payer: Self-pay | Admitting: Internal Medicine

## 2020-01-16 DIAGNOSIS — N393 Stress incontinence (female) (male): Secondary | ICD-10-CM

## 2020-01-16 DIAGNOSIS — I10 Essential (primary) hypertension: Secondary | ICD-10-CM

## 2020-01-16 LAB — HM DIABETES EYE EXAM

## 2020-01-16 MED ORDER — OXYBUTYNIN CHLORIDE 5 MG PO TABS: 5.0000 mg | ORAL_TABLET | Freq: Two times a day (BID) | ORAL | 2 refills | Status: DC

## 2020-01-16 NOTE — Telephone Encounter (Signed)
Requested medication (s) are due for refill today: Yes  Requested medication (s) are on the active medication list: Yes  Last refill: 11/25/19  Future visit scheduled: Yes  Notes to clinic:  Unable to refill per protocol, 2 sig instructions, unsure of which one the provider wants, sending to office     Requested Prescriptions  Pending Prescriptions Disp Refills   hydrALAZINE (APRESOLINE) 50 MG tablet 180 tablet 2    Sig: Take 1 tablet (50 mg total) by mouth 2 (two) times daily. TAKE 1 TABLET (25 MG TOTAL) BY MOUTH 3 (THREE) TIMES DAILY.      Cardiovascular:  Vasodilators Passed - 01/16/2020  5:22 PM      Passed - HCT in normal range and within 360 days    HCT  Date Value Ref Range Status  10/03/2019 37.9 36 - 46 % Final          Passed - HGB in normal range and within 360 days    Hemoglobin  Date Value Ref Range Status  10/03/2019 12.6 12.0 - 15.0 g/dL Final          Passed - RBC in normal range and within 360 days    RBC  Date Value Ref Range Status  10/03/2019 3.89 3.87 - 5.11 MIL/uL Final          Passed - WBC in normal range and within 360 days    WBC  Date Value Ref Range Status  10/03/2019 6.1 4.0 - 10.5 K/uL Final          Passed - PLT in normal range and within 360 days    Platelets  Date Value Ref Range Status  10/03/2019 354 150 - 400 K/uL Final          Passed - Last BP in normal range    BP Readings from Last 1 Encounters:  01/10/20 123/82          Passed - Valid encounter within last 12 months    Recent Outpatient Visits           1 month ago Need for influenza vaccination   [place], Annie Main L, RPH-CPP   1 month ago Type 2 diabetes mellitus with morbid obesity ([place])   [place] Karle Plumber B, MD   4 months ago Diabetes mellitus type 2 in obese [facility])   [place], [place], NP   4 months ago Appointment canceled by  hospital   [place] Karle Plumber B, MD   8 months ago Diabetes mellitus type 2 in obese [facility])   [place] Ladell Pier, MD       Future Appointments             In 1 month Ladell Pier, MD [place]             Signed Prescriptions Disp Refills   oxybutynin (DITROPAN) 5 MG tablet 180 tablet 2    Sig: Take 1 tablet (5 mg total) by mouth 2 (two) times daily.      Urology:  Bladder Agents Passed - 01/16/2020  5:22 PM      Passed - Valid encounter within last 12 months    Recent Outpatient Visits           1 month ago Need for influenza vaccination  [place], Jarome Matin, RPH-CPP   1 month ago Type 2 diabetes mellitus with morbid obesity [facility])   [place], MD   4 months ago Diabetes mellitus type 2 in obese [facility])   [place], [place], NP   4 months ago Appointment canceled by hospital   [place], MD   8 months ago Diabetes mellitus type 2 in obese [facility])   [place], MD       Future Appointments             In 1 month Wynetta Emery Dalbert Batman, MD [place]

## 2020-01-16 NOTE — Telephone Encounter (Signed)
Medication: hydrALAZINE (APRESOLINE) 50 MG tablet [[id number]] , oxybutynin (DITROPAN) 5 MG tablet [[id number]]   Has the patient contacted their pharmacy? YES (Agent: If no, request that the patient contact the pharmacy for the refill.) (Agent: If yes, when and what did the pharmacy advise?)  Preferred Pharmacy (with phone number or street name): [place], [address]  [place], [place] [address]  Phone:  [phone] Fax:  [phone]  Agent: Please be advised that RX refills may take up to 3 business days. We ask that you follow-up with your pharmacy.

## 2020-01-17 MED FILL — OXYBUTYNIN 5 MG TABLET: 5 | 90 days supply | Qty: 180 | Fill #0

## 2020-01-18 ENCOUNTER — Other Ambulatory Visit: Payer: Self-pay | Admitting: Internal Medicine

## 2020-01-18 MED ORDER — HYDRALAZINE HCL 50 MG PO TABS: 50.0000 mg | ORAL_TABLET | Freq: Two times a day (BID) | ORAL | 2 refills | Status: DC

## 2020-01-18 MED FILL — hydrALAZINE HCL 50 MG TABS: 50 | 30 days supply | Qty: 60 | Fill #0

## 2020-01-20 ENCOUNTER — Encounter (HOSPITAL_COMMUNITY): Payer: Self-pay

## 2020-01-20 ENCOUNTER — Other Ambulatory Visit: Payer: Self-pay

## 2020-01-20 ENCOUNTER — Ambulatory Visit (HOSPITAL_COMMUNITY)
Admission: EM | Admit: 2020-01-20 | Discharge: 2020-01-20 | Disposition: A | Discharge status: Home / Self Care | Payer: Medicaid Other | Attending: Urgent Care | Admitting: Urgent Care

## 2020-01-20 DIAGNOSIS — W5501XA Bitten by cat, initial encounter: Secondary | ICD-10-CM

## 2020-01-20 DIAGNOSIS — Z203 Contact with and (suspected) exposure to rabies: Secondary | ICD-10-CM

## 2020-01-20 DIAGNOSIS — S61251D Open bite of left index finger without damage to nail, subsequent encounter: Secondary | ICD-10-CM

## 2020-01-20 MED ORDER — RABIES VACCINE, PCEC IM SUSR
INTRAMUSCULAR | Status: AC
  Filled 2020-01-20: qty 1

## 2020-01-20 MED ORDER — RABIES VACCINE, PCEC IM SUSR: 1.0000 mL | Freq: Once | INTRAMUSCULAR | Status: DC

## 2020-01-20 NOTE — ED Triage Notes (Signed)
Pt present for last rabies vaccine.

## 2020-01-23 ENCOUNTER — Other Ambulatory Visit: Payer: Self-pay

## 2020-01-23 ENCOUNTER — Encounter (INDEPENDENT_AMBULATORY_CARE_PROVIDER_SITE_OTHER): Payer: Self-pay | Admitting: Adult Health

## 2020-01-23 ENCOUNTER — Ambulatory Visit (INDEPENDENT_AMBULATORY_CARE_PROVIDER_SITE_OTHER): Payer: Medicaid Other | Admitting: Adult Health

## 2020-01-23 VITALS — BP 120/82 | HR 81 | Temp 98.4°F | Ht 64.0 in | Wt 269.0 lb

## 2020-01-23 DIAGNOSIS — I152 Hypertension secondary to endocrine disorders: Secondary | ICD-10-CM

## 2020-01-23 DIAGNOSIS — E1159 Type 2 diabetes mellitus with other circulatory complications: Secondary | ICD-10-CM

## 2020-01-23 DIAGNOSIS — E1169 Type 2 diabetes mellitus with other specified complication: Secondary | ICD-10-CM | POA: Diagnosis not present

## 2020-01-23 DIAGNOSIS — Z6841 Body Mass Index (BMI) 40.0 and over, adult: Secondary | ICD-10-CM

## 2020-01-23 DIAGNOSIS — E559 Vitamin D deficiency, unspecified: Secondary | ICD-10-CM

## 2020-01-23 MED ORDER — VITAMIN D (ERGOCALCIFEROL) 1.25 MG (50000 UNIT) PO CAPS: 50000.0000 [IU] | ORAL_CAPSULE | ORAL | 0 refills | Status: DC

## 2020-01-23 MED FILL — ATORVASTATIN 10 MG TABLET: 10 | 30 days supply | Qty: 30 | Fill #1

## 2020-01-23 MED FILL — METFORMIN HCL 500 MG TABS: 500 | 30 days supply | Qty: 30 | Fill #2

## 2020-01-23 MED FILL — LOSARTAN POTASSIUM 100 MG T: 100 | 30 days supply | Qty: 30 | Fill #0

## 2020-01-24 NOTE — Progress Notes (Signed)
Chief Complaint:   OBESITY Sabrina [name] is here to discuss her progress with her obesity treatment plan along with follow-up of her obesity related diagnoses. Sabrina [name] is on the Category 2 Plan and states she is following her eating plan approximately 75-80% of the time. Sabrina [name] states she is exercising 0 minutes 0 times per week.  Today's visit was #: 4 Starting weight: 279 lbs Starting date: 12/13/2019 Today's weight: 269 lbs Today's date: 01/23/2020 Total lbs lost to date: 10 Total lbs lost since last in-office visit: 0  Interim History: Sabrina [name] has been experiencing cravings and she has been trying to make healthier snack choices to less than or equal to 200 calories a day. She and her husband will eat out every Friday for weekly date night. Her ultimate goal is to lose under 200 lbs.  Subjective:   Vitamin D deficiency. Vitamin D level 15.6 on 12/13/2019, well below goal of 50. Sabrina [name] is on Ergocalciferol. No nausea, vomiting, or muscle weakness.    Ref. Range 12/13/2019 12:32  Vitamin D, 25-Hydroxy Latest Ref Range: 30.0 - 100.0 ng/mL 15.6 (L)   Type 2 diabetes mellitus with other specified complication, without long-term current use of insulin (Sabrina [name]). 12/13/2019 A1c was at goal at 6.1. Sabrina [name] is on metformin 500 mg with breakfast.  Lab Results  Component Value Date   HGBA1C 6.1 (H) 12/13/2019   HGBA1C 5.5 11/25/2019   HGBA1C 5.5 05/16/2019   Lab Results  Component Value Date   MICROALBUR 0.2 12/07/2015   LDLCALC 94 11/25/2019   CREATININE 0.82 12/13/2019   Lab Results  Component Value Date   INSULIN 13.7 12/13/2019   Hypertension associated with type 2 diabetes mellitus (Sabrina [name]). Blood pressure and heart rate are at goal at today's office visit. Sabrina [name] is on losartan 100 mg daily.  BP Readings from Last 3 Encounters:  01/23/20 120/82  01/10/20 123/82  01/04/20 130/71   Lab Results  Component Value Date   CREATININE 0.82 12/13/2019   CREATININE 0.92  10/03/2019   CREATININE 1.00 02/18/2019   Assessment/Plan:   Vitamin D deficiency. Low Vitamin D level contributes to fatigue and are associated with obesity, breast, and colon cancer. She was given a refill on her Vitamin D, Ergocalciferol, (DRISDOL) 1.25 MG (50000 UNIT) CAPS capsule every week #4 with 0 refills and will follow-up for routine testing of Vitamin D, at least 2-3 times per year to avoid over-replacement.   Type 2 diabetes mellitus with other specified complication, without long-term current use of insulin (Sabrina [name]). Good blood sugar control is important to decrease the likelihood of diabetic complications such as nephropathy, neuropathy, limb loss, blindness, coronary artery disease, and death. Intensive lifestyle modification including diet, exercise and weight loss are the first line of treatment for diabetes. Sabrina [name] will continue metformin as directed and continue healthy eating.  Hypertension associated with type 2 diabetes mellitus (Sabrina [name]). Sabrina [name] is working on healthy weight loss and exercise to improve blood pressure control. We will watch for signs of hypotension as she continues her lifestyle modifications. She will continue ARB and healthy eating.  Class 3 severe obesity with serious comorbidity and body mass index (BMI) of 45.0 to 49.9 in adult, unspecified obesity type (Sabrina [name]).  Sabrina [name] is currently in the action stage of change. As such, her goal is to continue with weight loss efforts. She has agreed to the Category 2 Plan.   Handout was provided on 200-300 calorie microwave meals.  Exercise goals: No exercise has been prescribed  at this time.  Behavioral modification strategies: increasing lean protein intake, decreasing simple carbohydrates, decreasing liquid calories, decreasing eating out, meal planning and cooking strategies, better snacking choices and planning for success.  Sabrina [name] has agreed to follow-up with our clinic in 2 weeks. She was informed of the importance of  frequent follow-up visits to maximize her success with intensive lifestyle modifications for her multiple health conditions.   Objective:   Blood pressure 120/82, pulse 81, temperature 98.4 F (36.9 C), height 5\' 4"  (1.626 m), weight 269 lb (122 kg), SpO2 97 %. Body mass index is 46.17 kg/m.  General: Cooperative, alert, well developed, in no acute distress. HEENT: Conjunctivae and lids unremarkable. Cardiovascular: Regular rhythm.  Lungs: Normal work of breathing. Neurologic: No focal deficits.   Lab Results  Component Value Date   CREATININE 0.82 12/13/2019   BUN 11 12/13/2019   NA 139 12/13/2019   K 4.6 12/13/2019   CL 99 12/13/2019   CO2 22 12/13/2019   Lab Results  Component Value Date   ALT 28 12/13/2019   AST 25 12/13/2019   ALKPHOS 86 12/13/2019   BILITOT 0.3 12/13/2019   Lab Results  Component Value Date   HGBA1C 6.1 (H) 12/13/2019   HGBA1C 5.5 11/25/2019   HGBA1C 5.5 05/16/2019   HGBA1C 5.5 09/06/2018   HGBA1C 5.7 03/15/2018   Lab Results  Component Value Date   INSULIN 13.7 12/13/2019   Lab Results  Component Value Date   TSH 1.610 12/13/2019   Lab Results  Component Value Date   CHOL 212 (H) 11/25/2019   HDL 85 11/25/2019   LDLCALC 94 11/25/2019   TRIG 197 (H) 11/25/2019   CHOLHDL 2.5 11/25/2019   Lab Results  Component Value Date   WBC 6.1 10/03/2019   HGB 12.6 10/03/2019   HCT 37.9 10/03/2019   MCV 97.4 10/03/2019   PLT 354 10/03/2019   No results found for: IRON, TIBC, FERRITIN  Attestation Statements:   Reviewed by clinician on day of visit: allergies, medications, problem list, medical history, surgical history, family history, social history, and previous encounter notes.  Time spent on visit including pre-visit chart review and post-visit charting and care was 28 minutes.   I, Michaelene Song, am acting as Location manager for [employer], NP-C   I have reviewed the above documentation for accuracy and completeness, and I agree  with the above. -  Arohi Salvatierra d. Trica Usery, NP-C

## 2020-01-25 DIAGNOSIS — E559 Vitamin D deficiency, unspecified: Secondary | ICD-10-CM | POA: Insufficient documentation

## 2020-01-25 DIAGNOSIS — E119 Type 2 diabetes mellitus without complications: Secondary | ICD-10-CM | POA: Insufficient documentation

## 2020-01-30 ENCOUNTER — Encounter: Payer: Self-pay | Admitting: Internal Medicine

## 2020-01-30 ENCOUNTER — Ambulatory Visit: Payer: Self-pay

## 2020-01-30 MED FILL — ATORVASTATIN 10 MG TABLET: 10 | 30 days supply | Qty: 30 | Fill #1

## 2020-01-30 MED FILL — METFORMIN HCL 500 MG TABS: 500 | 30 days supply | Qty: 30 | Fill #2

## 2020-01-30 NOTE — Telephone Encounter (Signed)
Pt. Hung up before call could be connected.

## 2020-02-01 MED FILL — LOSARTAN POTASSIUM 100 MG T: 100 | 30 days supply | Qty: 30 | Fill #0

## 2020-02-01 NOTE — Telephone Encounter (Signed)
-----   Message from Jackelyn Knife, [place] sent at 02/01/2020  1:52 PM EDT ----- Regarding: appt Please contact pt and schedule a virtual appt for possible mold exposure

## 2020-02-02 ENCOUNTER — Encounter (INDEPENDENT_AMBULATORY_CARE_PROVIDER_SITE_OTHER): Payer: Self-pay

## 2020-02-06 ENCOUNTER — Encounter (INDEPENDENT_AMBULATORY_CARE_PROVIDER_SITE_OTHER): Payer: Self-pay | Admitting: Adult Health

## 2020-02-06 ENCOUNTER — Other Ambulatory Visit: Payer: Self-pay

## 2020-02-06 ENCOUNTER — Ambulatory Visit (INDEPENDENT_AMBULATORY_CARE_PROVIDER_SITE_OTHER): Payer: Medicaid Other | Admitting: Adult Health

## 2020-02-06 VITALS — BP 137/86 | HR 58 | Temp 98.0°F | Ht 64.0 in | Wt 266.0 lb

## 2020-02-06 DIAGNOSIS — E1169 Type 2 diabetes mellitus with other specified complication: Secondary | ICD-10-CM

## 2020-02-06 DIAGNOSIS — E1159 Type 2 diabetes mellitus with other circulatory complications: Secondary | ICD-10-CM | POA: Diagnosis not present

## 2020-02-06 DIAGNOSIS — E559 Vitamin D deficiency, unspecified: Secondary | ICD-10-CM

## 2020-02-06 DIAGNOSIS — I152 Hypertension secondary to endocrine disorders: Secondary | ICD-10-CM

## 2020-02-06 DIAGNOSIS — Z6841 Body Mass Index (BMI) 40.0 and over, adult: Secondary | ICD-10-CM

## 2020-02-06 MED ORDER — VITAMIN D (ERGOCALCIFEROL) 1.25 MG (50000 UNIT) PO CAPS: 50000.0000 [IU] | ORAL_CAPSULE | ORAL | 0 refills | Status: DC

## 2020-02-06 NOTE — Progress Notes (Signed)
Chief Complaint:   OBESITY Sabrina [name] is here to discuss her progress with her obesity treatment plan along with follow-up of her obesity related diagnoses. Sabrina [name] is on the Category 2 Plan and states she is following her eating plan approximately 50% of the time. Sabrina [name] states she is exercising 0 minutes 0 times per week.  Today's visit was #: 5 Starting weight: 279 lbs Starting date: 12/13/2019 Today's weight: 266 lbs Today's date: 02/06/2020 Total lbs lost to date: 13 Total lbs lost since last in-office visit: 3  Interim History: Sabrina [name] has not been making meals at home as often as she would like due to driving with [employer]. She will have cataract surgery later in the week and then driving greater than 10 hours to attend a funeral in [place] for her husband's family member.  Subjective:   Hypertension associated with type 2 diabetes mellitus ([place]). Blood pressure and heart rate are excellent on today's office visit. Sabrina [name] is on losartan 100 mg daily and hydralazine 50 mg BID.  BP Readings from Last 3 Encounters:  02/06/20 137/86  01/23/20 120/82  01/10/20 123/82   Lab Results  Component Value Date   CREATININE 0.82 12/13/2019   CREATININE 0.92 10/03/2019   CREATININE 1.00 02/18/2019   Type 2 diabetes mellitus with other specified complication, without long-term current use of insulin ([place]). 12/13/2019 A1c 6.1, at goal less than 7. Sabrina [name] is on metformin 500 mg with breakfast. She will eat 1-2 prunes when she feels constipated.   Lab Results  Component Value Date   HGBA1C 6.1 (H) 12/13/2019   HGBA1C 5.5 11/25/2019   HGBA1C 5.5 05/16/2019   Lab Results  Component Value Date   MICROALBUR 0.2 12/07/2015   LDLCALC 94 11/25/2019   CREATININE 0.82 12/13/2019   Lab Results  Component Value Date   INSULIN 13.7 12/13/2019   Vitamin D deficiency. Vitamin D level on 12/13/2019 was 15.6, well below goal of 50. Sabrina [name] is on Ergocalciferol. No nausea,  vomiting, or muscle weakness.    Ref. Range 12/13/2019 12:32  Vitamin D, 25-Hydroxy Latest Ref Range: 30.0 - 100.0 ng/mL 15.6 (L)   Assessment/Plan:   Hypertension associated with type 2 diabetes mellitus ([place]). Sabrina [name] is working on healthy weight loss and exercise to improve blood pressure control. We will watch for signs of hypotension as she continues her lifestyle modifications. She will continue her current antihypertensive therapy as directed.   Type 2 diabetes mellitus with other specified complication, without long-term current use of insulin ([place]). Good blood sugar control is important to decrease the likelihood of diabetic complications such as nephropathy, neuropathy, limb loss, blindness, coronary artery disease, and death. Intensive lifestyle modification including diet, exercise and weight loss are the first line of treatment for diabetes. Continue Metformin as directed and Cat 2 Meal plan.  Check labs Dec 2021.  Vitamin D deficiency. Low Vitamin D level contributes to fatigue and are associated with obesity, breast, and colon cancer. She was given a refill on her Vitamin D, Ergocalciferol, (DRISDOL) 1.25 MG (50000 UNIT) CAPS capsule every week #4 with 0 refills and will follow-up for routine testing of Vitamin D, at least 2-3 times per year to avoid over-replacement.   Class 3 severe obesity with serious comorbidity and body mass index (BMI) of 45.0 to 49.9 in adult, unspecified obesity type [facility])  Sabrina [name] is currently in the action stage of change. As such, her goal is to continue with weight loss efforts. She has agreed to  the Category 2 Plan.   Handout was provided on Additional Breakfast Options.  Exercise goals: Older adults should follow the adult guidelines. When older adults cannot meet the adult guidelines, they should be as physically active as their abilities and conditions will allow.   Behavioral modification strategies: increasing lean protein intake, decreasing  simple carbohydrates, no skipping meals, meal planning and cooking strategies, better snacking choices and planning for success.  Sabrina [name] has agreed to follow-up with our clinic in 2 weeks. She was informed of the importance of frequent follow-up visits to maximize her success with intensive lifestyle modifications for her multiple health conditions.   Objective:   Blood pressure 137/86, pulse (!) 58, temperature 98 F (36.7 C), height 5\' 4"  (1.626 m), weight 266 lb (120.7 kg), SpO2 98 %. Body mass index is 45.66 kg/m.  General: Cooperative, alert, well developed, in no acute distress. HEENT: Conjunctivae and lids unremarkable. Cardiovascular: Regular rhythm.  Lungs: Normal work of breathing. Neurologic: No focal deficits.   Lab Results  Component Value Date   CREATININE 0.82 12/13/2019   BUN 11 12/13/2019   NA 139 12/13/2019   K 4.6 12/13/2019   CL 99 12/13/2019   CO2 22 12/13/2019   Lab Results  Component Value Date   ALT 28 12/13/2019   AST 25 12/13/2019   ALKPHOS 86 12/13/2019   BILITOT 0.3 12/13/2019   Lab Results  Component Value Date   HGBA1C 6.1 (H) 12/13/2019   HGBA1C 5.5 11/25/2019   HGBA1C 5.5 05/16/2019   HGBA1C 5.5 09/06/2018   HGBA1C 5.7 03/15/2018   Lab Results  Component Value Date   INSULIN 13.7 12/13/2019   Lab Results  Component Value Date   TSH 1.610 12/13/2019   Lab Results  Component Value Date   CHOL 212 (H) 11/25/2019   HDL 85 11/25/2019   LDLCALC 94 11/25/2019   TRIG 197 (H) 11/25/2019   CHOLHDL 2.5 11/25/2019   Lab Results  Component Value Date   WBC 6.1 10/03/2019   HGB 12.6 10/03/2019   HCT 37.9 10/03/2019   MCV 97.4 10/03/2019   PLT 354 10/03/2019   No results found for: IRON, TIBC, FERRITIN  Attestation Statements:   Reviewed by clinician on day of visit: allergies, medications, problem list, medical history, surgical history, family history, social history, and previous encounter notes.  Time spent on visit including  pre-visit chart review and post-visit charting and care was 35 minutes.   I, Michaelene Song, am acting as Location manager for [employer], NP-C   I have reviewed the above documentation for accuracy and completeness, and I agree with the above. -  Aedyn Mckeon d. Ryelee Albee, NP-C

## 2020-02-08 MED FILL — LOSARTAN POTASSIUM 100 MG T: 100 | 30 days supply | Qty: 30 | Fill #0

## 2020-02-17 MED FILL — CETIRIZINE HCL 10 MG TABS: 10 | 30 days supply | Qty: 30 | Fill #1

## 2020-02-21 ENCOUNTER — Ambulatory Visit (INDEPENDENT_AMBULATORY_CARE_PROVIDER_SITE_OTHER): Payer: Medicaid Other | Admitting: Family Medicine

## 2020-02-21 ENCOUNTER — Ambulatory Visit: Payer: Medicaid Other | Attending: Internal Medicine | Admitting: Internal Medicine

## 2020-02-21 ENCOUNTER — Other Ambulatory Visit: Payer: Self-pay

## 2020-02-21 ENCOUNTER — Encounter: Payer: Self-pay | Admitting: Internal Medicine

## 2020-02-21 DIAGNOSIS — I152 Hypertension secondary to endocrine disorders: Secondary | ICD-10-CM | POA: Diagnosis not present

## 2020-02-21 DIAGNOSIS — E1169 Type 2 diabetes mellitus with other specified complication: Secondary | ICD-10-CM | POA: Diagnosis not present

## 2020-02-21 DIAGNOSIS — E669 Obesity, unspecified: Secondary | ICD-10-CM

## 2020-02-21 DIAGNOSIS — E1159 Type 2 diabetes mellitus with other circulatory complications: Secondary | ICD-10-CM | POA: Diagnosis not present

## 2020-02-21 DIAGNOSIS — Z77120 Contact with and (suspected) exposure to mold (toxic): Secondary | ICD-10-CM | POA: Insufficient documentation

## 2020-02-21 NOTE — Assessment & Plan Note (Signed)
This is a subjective complaint. No physical findings but that is not necessarily unusual.  I have advised that she have formal testing in her apartment (landlord or city). This certainly causes her a great deal of stress and resultant anxiety.  Given her sxs she should get a CXR

## 2020-02-21 NOTE — Assessment & Plan Note (Signed)
She has not taken meds today.  Previously BP has been adequately controlled.  She will go home, take meds and montor BP at home. Given her other comorbidities- goal bp <140/90

## 2020-02-21 NOTE — Assessment & Plan Note (Signed)
Previously adequately controlled.
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
## 2020-02-21 NOTE — Patient Instructions (Addendum)
You have indicated that there is mold in your home. Your home should be tested for mold. You may need to call your local city or county to have appropriate mold testing.   Given your chronic cough, you will be scheduled for a Chest xray.  You will also be scheduled to see an allergist for skin testing for mold exposure/allergy

## 2020-02-27 ENCOUNTER — Ambulatory Visit (INDEPENDENT_AMBULATORY_CARE_PROVIDER_SITE_OTHER): Payer: Medicaid Other | Admitting: Family Medicine

## 2020-02-27 ENCOUNTER — Encounter (INDEPENDENT_AMBULATORY_CARE_PROVIDER_SITE_OTHER): Payer: Self-pay | Admitting: Family Medicine

## 2020-02-27 ENCOUNTER — Other Ambulatory Visit: Payer: Self-pay

## 2020-02-27 VITALS — BP 113/73 | HR 91 | Temp 98.7°F | Ht 64.0 in | Wt 267.0 lb

## 2020-02-27 DIAGNOSIS — Z6841 Body Mass Index (BMI) 40.0 and over, adult: Secondary | ICD-10-CM | POA: Diagnosis not present

## 2020-02-27 DIAGNOSIS — E559 Vitamin D deficiency, unspecified: Secondary | ICD-10-CM

## 2020-02-27 DIAGNOSIS — E1169 Type 2 diabetes mellitus with other specified complication: Secondary | ICD-10-CM

## 2020-02-27 MED ORDER — VITAMIN D (ERGOCALCIFEROL) 1.25 MG (50000 UNIT) PO CAPS: 50000.0000 [IU] | ORAL_CAPSULE | ORAL | 0 refills | Status: DC

## 2020-02-27 NOTE — Progress Notes (Signed)
Chief Complaint:   OBESITY Sabrina [name] is here to discuss her progress with her obesity treatment plan along with follow-up of her obesity related diagnoses. Sabrina [name] is on the Category 2 Plan and states she is following her eating plan approximately 10% of the time. Sabrina [name] states she is exercising 0 minutes 0 times per week.  Today's visit was #: 6 Starting weight: 279 lbs Starting date: 12/13/2019 Today's weight: 267 lbs Today's date: 02/27/2020 Total lbs lost to date: 12 Total lbs lost since last in-office visit: 0  Interim History: Sabrina [name] had [employer] for Thanksgiving and has been off the plan since then. She had a traditional Thanksgiving yesterday. She does deviate from the plan at times and opts to go Vegetarian. She does not consider the protein content of food when making vegetarian choices.  She notes hunger between meals.   Subjective:   Vitamin D deficiency. Vitamin D was low at 15.6 on 12/13/2019. Sabrina [name] is on weekly prescription Vitamin D.   Ref. Range 12/13/2019 12:32  Vitamin D, 25-Hydroxy Latest Ref Range: 30.0 - 100.0 ng/mL 15.6 (L)   Type 2 diabetes mellitus with other specified complication, without long-term current use of insulin (Sabrina [name]). Diabetes is well controlled. Last A1c was 6.1. Sabrina [name] is on metformin. We discussed Ozempic to help with hunger and she will consider this.  Lab Results  Component Value Date   HGBA1C 6.1 (H) 12/13/2019   HGBA1C 5.5 11/25/2019   HGBA1C 5.5 05/16/2019   Lab Results  Component Value Date   MICROALBUR 0.2 12/07/2015   LDLCALC 94 11/25/2019   CREATININE 0.82 12/13/2019   Lab Results  Component Value Date   INSULIN 13.7 12/13/2019   Assessment/Plan:   Vitamin D deficiency. Low Vitamin D level contributes to fatigue and are associated with obesity, breast, and colon cancer. She was given a refill on her Vitamin D, Ergocalciferol, (Sabrina [name]) 1.25 MG (50000 UNIT) CAPS capsule every week #4 with 0 refills and  will follow-up for routine testing of Vitamin D, at least 2-3 times per year to avoid over-replacement.   Type 2 diabetes mellitus with other specified complication, without long-term current use of insulin (Sabrina [name]).   Sabrina [name] will continue metformin as directed. I have sent a message to her PCP at her request to see what Dr. Wynetta Emery thinks about adding Ozempic to her med regimen to help with hunger.   Class 3 severe obesity with serious comorbidity and body mass index (BMI) of 45.0 to 49.9 in adult, unspecified obesity type (Sabrina [name]).  Sabrina [name] is currently in the action stage of change. As such, her goal is to continue with weight loss efforts. She has agreed to the Category 2 Plan or the Sabrina [name] with 400-500 calories and 35 grams of protein at supper.   Handout was provided on the Vegetarian meal plan. We discussed the importance of protein in her diet. I advised her to make sure she has adequate protein at all meals.  Exercise goals: All adults should avoid inactivity. Some physical activity is better than none, and adults who participate in any amount of physical activity gain some health benefits.  Behavioral modification strategies: meal planning and cooking strategies.  Sabrina [name] has agreed to follow-up with our clinic in 2 weeks.   Objective:   Blood pressure 113/73, pulse 91, temperature 98.7 F (37.1 C), height 5\' 4"  (1.626 m), weight 267 lb (121.1 kg), SpO2 97 %. Body mass index is 45.83 kg/m.  General: Cooperative, alert, well developed,  in no acute distress. HEENT: Conjunctivae and lids unremarkable. Cardiovascular: Regular rhythm.  Lungs: Normal work of breathing. Neurologic: No focal deficits.   Lab Results  Component Value Date   CREATININE 0.82 12/13/2019   BUN 11 12/13/2019   NA 139 12/13/2019   K 4.6 12/13/2019   CL 99 12/13/2019   CO2 22 12/13/2019   Lab Results  Component Value Date   ALT 28 12/13/2019   AST 25 12/13/2019   ALKPHOS 86 12/13/2019   BILITOT  0.3 12/13/2019   Lab Results  Component Value Date   HGBA1C 6.1 (H) 12/13/2019   HGBA1C 5.5 11/25/2019   HGBA1C 5.5 05/16/2019   HGBA1C 5.5 09/06/2018   HGBA1C 5.7 03/15/2018   Lab Results  Component Value Date   INSULIN 13.7 12/13/2019   Lab Results  Component Value Date   TSH 1.610 12/13/2019   Lab Results  Component Value Date   CHOL 212 (H) 11/25/2019   HDL 85 11/25/2019   LDLCALC 94 11/25/2019   TRIG 197 (H) 11/25/2019   CHOLHDL 2.5 11/25/2019   Lab Results  Component Value Date   WBC 6.1 10/03/2019   HGB 12.6 10/03/2019   HCT 37.9 10/03/2019   MCV 97.4 10/03/2019   PLT 354 10/03/2019   No results found for: IRON, TIBC, FERRITIN  Attestation Statements:   Reviewed by clinician on day of visit: allergies, medications, problem list, medical history, surgical history, family history, social history, and previous encounter notes.  IMichaelene Song, am acting as Location manager for [employer], FNP-C   I have reviewed the above documentation for accuracy and completeness, and I agree with the above. -  Georgianne Fick, FNP

## 2020-02-28 ENCOUNTER — Encounter: Payer: Self-pay | Admitting: Internal Medicine

## 2020-02-28 ENCOUNTER — Ambulatory Visit (HOSPITAL_COMMUNITY)
Admission: RE | Admit: 2020-02-28 | Discharge: 2020-02-28 | Disposition: A | Discharge status: Home / Self Care | Payer: Medicaid Other | Source: Ambulatory Visit | Attending: Internal Medicine | Admitting: Internal Medicine

## 2020-02-28 ENCOUNTER — Ambulatory Visit: Payer: Medicaid Other | Attending: Internal Medicine | Admitting: Internal Medicine

## 2020-02-28 ENCOUNTER — Other Ambulatory Visit: Payer: Self-pay

## 2020-02-28 ENCOUNTER — Other Ambulatory Visit: Payer: Self-pay | Admitting: Internal Medicine

## 2020-02-28 VITALS — BP 126/80 | HR 102 | Temp 96.4°F | Resp 16 | Ht 64.0 in | Wt 272.2 lb

## 2020-02-28 DIAGNOSIS — R0683 Snoring: Secondary | ICD-10-CM | POA: Insufficient documentation

## 2020-02-28 DIAGNOSIS — Z7982 Long term (current) use of aspirin: Secondary | ICD-10-CM | POA: Insufficient documentation

## 2020-02-28 DIAGNOSIS — N3281 Overactive bladder: Secondary | ICD-10-CM

## 2020-02-28 DIAGNOSIS — I10 Essential (primary) hypertension: Secondary | ICD-10-CM | POA: Insufficient documentation

## 2020-02-28 DIAGNOSIS — Z79899 Other long term (current) drug therapy: Secondary | ICD-10-CM | POA: Insufficient documentation

## 2020-02-28 DIAGNOSIS — Z888 Allergy status to other drugs, medicaments and biological substances status: Secondary | ICD-10-CM | POA: Insufficient documentation

## 2020-02-28 DIAGNOSIS — Z7722 Contact with and (suspected) exposure to environmental tobacco smoke (acute) (chronic): Secondary | ICD-10-CM | POA: Insufficient documentation

## 2020-02-28 DIAGNOSIS — Z7984 Long term (current) use of oral hypoglycemic drugs: Secondary | ICD-10-CM | POA: Insufficient documentation

## 2020-02-28 DIAGNOSIS — N3941 Urge incontinence: Secondary | ICD-10-CM | POA: Insufficient documentation

## 2020-02-28 DIAGNOSIS — Z77120 Contact with and (suspected) exposure to mold (toxic): Secondary | ICD-10-CM

## 2020-02-28 DIAGNOSIS — M1711 Unilateral primary osteoarthritis, right knee: Secondary | ICD-10-CM | POA: Insufficient documentation

## 2020-02-28 DIAGNOSIS — Z880 Allergy status to penicillin: Secondary | ICD-10-CM | POA: Insufficient documentation

## 2020-02-28 DIAGNOSIS — Z6841 Body Mass Index (BMI) 40.0 and over, adult: Secondary | ICD-10-CM | POA: Insufficient documentation

## 2020-02-28 DIAGNOSIS — F319 Bipolar disorder, unspecified: Secondary | ICD-10-CM | POA: Insufficient documentation

## 2020-02-28 DIAGNOSIS — R058 Other specified cough: Secondary | ICD-10-CM

## 2020-02-28 DIAGNOSIS — Z0000 Encounter for general adult medical examination without abnormal findings: Secondary | ICD-10-CM | POA: Diagnosis not present

## 2020-02-28 DIAGNOSIS — M479 Spondylosis, unspecified: Secondary | ICD-10-CM | POA: Insufficient documentation

## 2020-02-28 DIAGNOSIS — Z5901 Sheltered homelessness: Secondary | ICD-10-CM | POA: Insufficient documentation

## 2020-02-28 DIAGNOSIS — K219 Gastro-esophageal reflux disease without esophagitis: Secondary | ICD-10-CM | POA: Insufficient documentation

## 2020-02-28 DIAGNOSIS — Z560 Unemployment, unspecified: Secondary | ICD-10-CM | POA: Insufficient documentation

## 2020-02-28 DIAGNOSIS — E785 Hyperlipidemia, unspecified: Secondary | ICD-10-CM | POA: Insufficient documentation

## 2020-02-28 DIAGNOSIS — M059 Rheumatoid arthritis with rheumatoid factor, unspecified: Secondary | ICD-10-CM | POA: Insufficient documentation

## 2020-02-28 DIAGNOSIS — E119 Type 2 diabetes mellitus without complications: Secondary | ICD-10-CM | POA: Insufficient documentation

## 2020-02-28 DIAGNOSIS — Z853 Personal history of malignant neoplasm of breast: Secondary | ICD-10-CM | POA: Insufficient documentation

## 2020-02-28 DIAGNOSIS — R053 Chronic cough: Secondary | ICD-10-CM | POA: Insufficient documentation

## 2020-02-28 DIAGNOSIS — Z5900 Homelessness unspecified: Secondary | ICD-10-CM | POA: Insufficient documentation

## 2020-02-28 DIAGNOSIS — J45909 Unspecified asthma, uncomplicated: Secondary | ICD-10-CM | POA: Insufficient documentation

## 2020-02-28 MED ORDER — SOLIFENACIN SUCCINATE 5 MG PO TABS: 5.0000 mg | ORAL_TABLET | Freq: Every day | ORAL | 3 refills | Status: DC

## 2020-02-28 MED ORDER — OMEPRAZOLE 20 MG PO CPDR: 20.0000 mg | DELAYED_RELEASE_CAPSULE | Freq: Every day | ORAL | 3 refills | Status: DC

## 2020-02-28 MED FILL — METFORMIN HCL 500 MG TABS: 500 | 30 days supply | Qty: 30 | Fill #3
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
## 2020-02-28 NOTE — Patient Instructions (Addendum)
Start omeprazole to help with acid reflux symptoms.   Stop the oxybutynin.  I have prescribed a medication called Vesicare instead.  Gastroesophageal Reflux Disease, Adult Gastroesophageal reflux (GER) happens when acid from the stomach flows up into the tube that connects the mouth and the stomach (esophagus). Normally, food travels down the esophagus and stays in the stomach to be digested. With GER, food and stomach acid sometimes move back up into the esophagus. You may have a disease called gastroesophageal reflux disease (GERD) if the reflux:  Happens often.  Causes frequent or very bad symptoms.  Causes problems such as damage to the esophagus. When this happens, the esophagus becomes sore and swollen (inflamed). Over time, GERD can make small holes (ulcers) in the lining of the esophagus. What are the causes? This condition is caused by a problem with the muscle between the esophagus and the stomach. When this muscle is weak or not normal, it does not close properly to keep food and acid from coming back up from the stomach. The muscle can be weak because of:  Tobacco use.  Pregnancy.  Having a certain type of hernia (hiatal hernia).  Alcohol use.  Certain foods and drinks, such as coffee, chocolate, onions, and peppermint. What increases the risk? You are more likely to develop this condition if you:  Are overweight.  Have a disease that affects your connective tissue.  Use NSAID medicines. What are the signs or symptoms? Symptoms of this condition include:  Heartburn.  Difficult or painful swallowing.  The feeling of having a lump in the throat.  A bitter taste in the mouth.  Bad breath.  Having a lot of saliva.  Having an upset or bloated stomach.  Belching.  Chest pain. Different conditions can cause chest pain. Make sure you see your doctor if you have chest pain.  Shortness of breath or noisy breathing (wheezing).  Ongoing (chronic) cough or a  cough at night.  Wearing away of the surface of teeth (tooth enamel).  Weight loss. How is this treated? Treatment will depend on how bad your symptoms are. Your doctor may suggest:  Changes to your diet.  Medicine.  Surgery. Follow these instructions at home: Eating and drinking   Follow a diet as told by your doctor. You may need to avoid foods and drinks such as: ? Coffee and tea (with or without caffeine). ? Drinks that contain alcohol. ? Energy drinks and sports drinks. ? Bubbly (carbonated) drinks or sodas. ? Chocolate and cocoa. ? Peppermint and mint flavorings. ? Garlic and onions. ? Horseradish. ? Spicy and acidic foods. These include peppers, chili powder, curry powder, vinegar, hot sauces, and BBQ sauce. ? Citrus fruit juices and citrus fruits, such as oranges, lemons, and limes. ? Tomato-based foods. These include red sauce, chili, salsa, and pizza with red sauce. ? Fried and fatty foods. These include donuts, french fries, potato chips, and high-fat dressings. ? High-fat meats. These include hot dogs, rib eye steak, sausage, ham, and bacon. ? High-fat dairy items, such as whole milk, butter, and cream cheese.  Eat small meals often. Avoid eating large meals.  Avoid drinking large amounts of liquid with your meals.  Avoid eating meals during the 2-3 hours before bedtime.  Avoid lying down right after you eat.  Do not exercise right after you eat. Lifestyle   Do not use any products that contain nicotine or tobacco. These include cigarettes, e-cigarettes, and chewing tobacco. If you need help quitting, ask your doctor.  Try to lower your stress. If you need help doing this, ask your doctor.  If you are overweight, lose an amount of weight that is healthy for you. Ask your doctor about a safe weight loss goal. General instructions  Pay attention to any changes in your symptoms.  Take over-the-counter and prescription medicines only as told by your  doctor. Do not take aspirin, ibuprofen, or other NSAIDs unless your doctor says it is okay.  Wear loose clothes. Do not wear anything tight around your waist.  Raise (elevate) the head of your bed about 6 inches (15 cm).  Avoid bending over if this makes your symptoms worse.  Keep all follow-up visits as told by your doctor. This is important. Contact a doctor if:  You have new symptoms.  You lose weight and you do not know why.  You have trouble swallowing or it hurts to swallow.  You have wheezing or a cough that keeps happening.  Your symptoms do not get better with treatment.  You have a hoarse voice. Get help right away if:  You have pain in your arms, neck, jaw, teeth, or back.  You feel sweaty, dizzy, or light-headed.  You have chest pain or shortness of breath.  You throw up (vomit) and your throw-up looks like blood or coffee grounds.  You pass out (faint).  Your poop (stool) is bloody or black.  You cannot swallow, drink, or eat. Summary  If a person has gastroesophageal reflux disease (GERD), food and stomach acid move back up into the esophagus and cause symptoms or problems such as damage to the esophagus.  Treatment will depend on how bad your symptoms are.  Follow a diet as told by your doctor.  Take all medicines only as told by your doctor. This information is not intended to replace advice given to you by your health care provider. Make sure you discuss any questions you have with your health care provider. Document Revised: 09/23/2017 Document Reviewed: 09/23/2017 Elsevier Patient Education  [place].

## 2020-02-29 ENCOUNTER — Encounter: Payer: Self-pay | Admitting: Internal Medicine

## 2020-02-29 DIAGNOSIS — H40009 Preglaucoma, unspecified, unspecified eye: Secondary | ICD-10-CM | POA: Insufficient documentation

## 2020-02-29 MED FILL — OMEPRAZOLE 20 MG CAP: 20 | 30 days supply | Qty: 30 | Fill #0

## 2020-02-29 MED FILL — SOLIFENACIN SUCCINATE 5 MG: 5 | 30 days supply | Qty: 30 | Fill #0

## 2020-03-05 ENCOUNTER — Telehealth: Payer: Medicaid Other | Admitting: Internal Medicine

## 2020-03-08 MED FILL — hydrALAZINE HCL 50 MG TABS: 50 | 30 days supply | Qty: 60 | Fill #1

## 2020-03-12 ENCOUNTER — Other Ambulatory Visit: Payer: Self-pay

## 2020-03-12 ENCOUNTER — Encounter (INDEPENDENT_AMBULATORY_CARE_PROVIDER_SITE_OTHER): Payer: Self-pay | Admitting: Family Medicine

## 2020-03-12 ENCOUNTER — Ambulatory Visit (INDEPENDENT_AMBULATORY_CARE_PROVIDER_SITE_OTHER): Payer: Medicaid Other | Admitting: Family Medicine

## 2020-03-12 VITALS — BP 126/79 | HR 74 | Temp 98.9°F | Ht 64.0 in | Wt 265.0 lb

## 2020-03-12 DIAGNOSIS — E1169 Type 2 diabetes mellitus with other specified complication: Secondary | ICD-10-CM

## 2020-03-12 DIAGNOSIS — Z6841 Body Mass Index (BMI) 40.0 and over, adult: Secondary | ICD-10-CM

## 2020-03-12 DIAGNOSIS — E559 Vitamin D deficiency, unspecified: Secondary | ICD-10-CM | POA: Diagnosis not present

## 2020-03-12 MED ORDER — VITAMIN D (ERGOCALCIFEROL) 1.25 MG (50000 UNIT) PO CAPS: 50000.0000 [IU] | ORAL_CAPSULE | ORAL | 0 refills | Status: DC

## 2020-03-12 MED ORDER — OZEMPIC (0.25 OR 0.5 MG/DOSE) 2 MG/1.5ML ~~LOC~~ SOPN: 0.2500 mg | PEN_INJECTOR | SUBCUTANEOUS | 0 refills | Status: DC

## 2020-03-13 ENCOUNTER — Encounter (INDEPENDENT_AMBULATORY_CARE_PROVIDER_SITE_OTHER): Payer: Self-pay | Admitting: Family Medicine

## 2020-03-13 ENCOUNTER — Ambulatory Visit: Payer: Medicaid Other | Admitting: Internal Medicine

## 2020-03-13 NOTE — Progress Notes (Signed)
Chief Complaint:   OBESITY Sabrina [name] is here to discuss her progress with her obesity treatment plan along with follow-up of her obesity related diagnoses. Sabrina [name] is on the Category 2 Plan and states she is following her eating plan approximately 50% of the time. Sabrina [name] states she is exercising 0 minutes 0 times per week.  Today's visit was #: 7 Starting weight: 279 lbs Starting date: 12/13/2019 Today's weight: 265 lbs Today's date: 03/12/2020 Total lbs lost to date: 14 lbs Total lbs lost since last in-office visit: 2 lbs  Interim History: Sabrina [name] states that she is tired of lunch meat and wants to know if she can cook her own chicken. She has done well and is down 14 lbs since 12/13/19.  Subjective:   1. Type 2 diabetes mellitus with other specified complication, without long-term current use of insulin ([place]) Sabrina [name] notes polyphagia "all of the time." She is on Metformin 500 mg daily. Her diabetes is well controlled.   Lab Results  Component Value Date   HGBA1C 6.1 (H) 12/13/2019   HGBA1C 5.5 11/25/2019   HGBA1C 5.5 05/16/2019   Lab Results  Component Value Date   MICROALBUR 0.2 12/07/2015   LDLCALC 94 11/25/2019   CREATININE 0.82 12/13/2019   Lab Results  Component Value Date   INSULIN 13.7 12/13/2019    2. Vitamin D deficiency Sabrina [name]'s Vit D was low at 15.6 on 12/13/2019. She is on prescription Vit D weekly.   Assessment/Plan:   1. Type 2 diabetes mellitus with other specified complication, without long-term current use of insulin ([place]) Sabrina [name] will start Ozempic 0.25 mg weekly.  Sabrina [name] denies personal or family history of thyroid cancer, history of pancreatitis, or current cholelithiasis. Sabrina [name] was informed of side effects.  - Semaglutide,0.25 or 0.5MG /DOS, (OZEMPIC, 0.25 OR 0.5 MG/DOSE,) 2 MG/1.5ML SOPN; Inject 0.25 mg into the skin once a week.  Dispense: 1.5 mL; Refill: 0  2. Vitamin D deficiency  She agrees to continue to take prescription Vitamin D @50 ,000 IU  every week.  - Vitamin D, Ergocalciferol, (DRISDOL) 1.25 MG (50000 UNIT) CAPS capsule; Take 1 capsule (50,000 Units total) by mouth every 7 (seven) days.  Dispense: 4 capsule; Refill: 0  3. Class 3 severe obesity with serious comorbidity and body mass index (BMI) of 45.0 to 49.9 in adult, unspecified obesity type [facility]) Sabrina [name] is currently in the action stage of change. As such, her goal is to continue with weight loss efforts. She has agreed to the Category 2 Plan and keeping a food journal and adhering to recommended goals of 200-300 calories and 25 grams protein.   Additional lunch options discussed.  Handout given: Eating out  Exercise goals: No exercise has been prescribed at this time.  Behavioral modification strategies: increasing lean protein intake and meal planning and cooking strategies.  Sabrina [name] has agreed to follow-up with our clinic in 3 weeks. Objective:   Blood pressure 126/79, pulse 74, temperature 98.9 F (37.2 C), resp. rate (!) 99, weight 265 lb (120.2 kg). Body mass index is 45.49 kg/m.  General: Cooperative, alert, well developed, in no acute distress. HEENT: Conjunctivae and lids unremarkable. Cardiovascular: Regular rhythm.  Lungs: Normal work of breathing. Neurologic: No focal deficits.   Lab Results  Component Value Date   CREATININE 0.82 12/13/2019   BUN 11 12/13/2019   NA 139 12/13/2019   K 4.6 12/13/2019   CL 99 12/13/2019   CO2 22 12/13/2019   Lab Results  Component Value Date  ALT 28 12/13/2019   AST 25 12/13/2019   ALKPHOS 86 12/13/2019   BILITOT 0.3 12/13/2019   Lab Results  Component Value Date   HGBA1C 6.1 (H) 12/13/2019   HGBA1C 5.5 11/25/2019   HGBA1C 5.5 05/16/2019   HGBA1C 5.5 09/06/2018   HGBA1C 5.7 03/15/2018   Lab Results  Component Value Date   INSULIN 13.7 12/13/2019   Lab Results  Component Value Date   TSH 1.610 12/13/2019   Lab Results  Component Value Date   CHOL 212 (H) 11/25/2019   HDL 85 11/25/2019    LDLCALC 94 11/25/2019   TRIG 197 (H) 11/25/2019   CHOLHDL 2.5 11/25/2019   Lab Results  Component Value Date   WBC 6.1 10/03/2019   HGB 12.6 10/03/2019   HCT 37.9 10/03/2019   MCV 97.4 10/03/2019   PLT 354 10/03/2019    Attestation Statements:   Reviewed by clinician on day of visit: allergies, medications, problem list, medical history, surgical history, family history, social history, and previous encounter notes.  Coral Ceo, am acting as Location manager for [employer], [place].  I have reviewed the above documentation for accuracy and completeness, and I agree with the above. -  Georgianne Fick, FNP

## 2020-03-20 MED FILL — ATORVASTATIN 10 MG TABLET: 10 | 30 days supply | Qty: 30 | Fill #2

## 2020-03-20 MED FILL — CETIRIZINE HCL 10 MG TABS: 10 | 30 days supply | Qty: 30 | Fill #2

## 2020-03-20 MED FILL — LOSARTAN POTASSIUM 100 MG T: 100 | 30 days supply | Qty: 30 | Fill #1

## 2020-03-26 ENCOUNTER — Ambulatory Visit: Payer: Medicaid Other | Attending: Internal Medicine

## 2020-03-26 DIAGNOSIS — Z23 Encounter for immunization: Secondary | ICD-10-CM

## 2020-03-26 NOTE — Progress Notes (Signed)
   Covid-19 Vaccination Clinic  Name:  Dulcie Gammon    MRN: [id number] DOB: [birth year]  03/26/2020  Ms. Manwarren was observed post Covid-19 immunization for 15 minutes without incident. She was provided with Vaccine Information Sheet and instruction to access the V-Safe system.   Ms. Kring was instructed to call 911 with any severe reactions post vaccine: Marland Kitchen Difficulty breathing  . Swelling of face and throat  . A fast heartbeat  . A bad rash all over body  . Dizziness and weakness   Immunizations Administered    Name Date Dose VIS Date Route   Pfizer COVID-19 Vaccine 03/26/2020  2:56 PM 0.3 mL 01/18/2020 Intramuscular   Manufacturer: [employer], [employer]   Lot: [id number]   NDC: [address]-0

## 2020-04-03 ENCOUNTER — Other Ambulatory Visit: Payer: Self-pay

## 2020-04-03 ENCOUNTER — Encounter (INDEPENDENT_AMBULATORY_CARE_PROVIDER_SITE_OTHER): Payer: Self-pay | Admitting: Family Medicine

## 2020-04-03 ENCOUNTER — Ambulatory Visit (INDEPENDENT_AMBULATORY_CARE_PROVIDER_SITE_OTHER): Payer: Medicaid Other | Admitting: Family Medicine

## 2020-04-03 VITALS — BP 144/75 | HR 75 | Temp 98.6°F | Ht 64.0 in | Wt 266.0 lb

## 2020-04-03 DIAGNOSIS — E1159 Type 2 diabetes mellitus with other circulatory complications: Secondary | ICD-10-CM

## 2020-04-03 DIAGNOSIS — E1169 Type 2 diabetes mellitus with other specified complication: Secondary | ICD-10-CM

## 2020-04-03 DIAGNOSIS — I152 Hypertension secondary to endocrine disorders: Secondary | ICD-10-CM

## 2020-04-03 DIAGNOSIS — I10 Essential (primary) hypertension: Secondary | ICD-10-CM

## 2020-04-03 DIAGNOSIS — Z6841 Body Mass Index (BMI) 40.0 and over, adult: Secondary | ICD-10-CM

## 2020-04-03 MED ORDER — OZEMPIC (0.25 OR 0.5 MG/DOSE) 2 MG/1.5ML ~~LOC~~ SOPN: 0.2500 mg | PEN_INJECTOR | SUBCUTANEOUS | 0 refills | Status: DC

## 2020-04-05 ENCOUNTER — Encounter (INDEPENDENT_AMBULATORY_CARE_PROVIDER_SITE_OTHER): Payer: Self-pay | Admitting: Family Medicine

## 2020-04-05 NOTE — Progress Notes (Signed)
Chief Complaint:   OBESITY Sabrina [name] is here to discuss her progress with her obesity treatment plan along with follow-up of her obesity related diagnoses. Sabrina [name] is on the Category 2 Plan and states she is following her eating plan approximately 0% of the time. Sabrina [name] states she is not getting any exercise.  Today's visit was #: 8 Starting weight: 279 lbs Starting date: 12/13/2019 Today's weight: 266 Today's date: 04/03/2020 Total lbs lost to date: 13 lbs Total lbs lost since last in-office visit: 0  Interim History: Sabrina [name] was off plan over the holiday. She feels she is overeating due to stress- family and financial. Sabrina [name] really wants to get back on plan. She is trying to eat up foods she has in the house. She has been eating quite a few carbs: chips, cookies, etc.  Subjective:   1. Type 2 diabetes mellitus with other specified complication, without long-term current use of insulin ([facility]) Medications reviewed (metformin). Diabetic ROS: no hypoglycemia, no medication side effects noted, weight has increased. Her diabetes is well controlled. A1c is 6.1. Sabrina [name] has not started Ozempic prescribed at last OV or picked it up. She is on metformin. She wants to pick up the Ozempic and get started.  Lab Results  Component Value Date   HGBA1C 6.1 (H) 12/13/2019   HGBA1C 5.5 11/25/2019   HGBA1C 5.5 05/16/2019   Lab Results  Component Value Date   MICROALBUR 0.2 12/07/2015   LDLCALC 94 11/25/2019   CREATININE 0.82 12/13/2019   Lab Results  Component Value Date   INSULIN 13.7 12/13/2019    2. Essential hypertension Review: taking medications as instructed, no medication side effects noted, no chest pain on exertion, no dyspnea on exertion.  BP is elevated today. Sabrina [name] is currently on Apresoline and Lasartan.She has not taken her medicine today due to being upset over financial situation.  BP Readings from Last 3 Encounters:  04/03/20 (!) 144/75  03/12/20 126/79  02/28/20 126/80       Assessment/Plan:   1. Type 2 diabetes mellitus with other specified complication, without long-term current use of insulin ([facility])  Continue metformin and start Ozempic. New Rx for Ozempic 0.25 mg weekly 0 refill.  - Semaglutide,0.25 or 0.5MG /DOS, (OZEMPIC, 0.25 OR 0.5 MG/DOSE,) 2 MG/1.5ML SOPN; Inject 0.25 mg into the skin once a week.  Dispense: 1.5 mL; Refill: 0  2. Essential hypertension Sabrina [name] is working on healthy weight loss and exercise to improve blood pressure control. Sabrina [name] will take medication when she gets home. She will be more compliant with medications.  3. Class 3 severe obesity with serious comorbidity and body mass index (BMI) of 45.0 to 49.9 in adult, unspecified obesity type [facility])  Sabrina [name] is currently in the action stage of change. As such, her goal is to continue with weight loss efforts. She has agreed to the Category 2 Plan.   Exercise goals: No exercise has been prescribed at this time.  Behavioral modification strategies: increasing lean protein intake and decreasing simple carbohydrates.  Sabrina [name] has agreed to follow-up with our clinic in 3 weeks.  Objective:   Blood pressure (!) 144/75, pulse 75, temperature 98.6 F (37 C), height 5\' 4"  (1.626 m), weight 266 lb (120.7 kg), SpO2 99 %. Body mass index is 45.66 kg/m.  General: Cooperative, alert, well developed, in no acute distress. HEENT: Conjunctivae and lids unremarkable. Cardiovascular: Regular rhythm.  Lungs: Normal work of breathing. Neurologic: No focal deficits.   Lab Results  Component Value Date  CREATININE 0.82 12/13/2019   BUN 11 12/13/2019   NA 139 12/13/2019   K 4.6 12/13/2019   CL 99 12/13/2019   CO2 22 12/13/2019   Lab Results  Component Value Date   ALT 28 12/13/2019   AST 25 12/13/2019   ALKPHOS 86 12/13/2019   BILITOT 0.3 12/13/2019   Lab Results  Component Value Date   HGBA1C 6.1 (H) 12/13/2019   HGBA1C 5.5 11/25/2019   HGBA1C 5.5 05/16/2019   HGBA1C 5.5  09/06/2018   HGBA1C 5.7 03/15/2018   Lab Results  Component Value Date   INSULIN 13.7 12/13/2019   Lab Results  Component Value Date   TSH 1.610 12/13/2019   Lab Results  Component Value Date   CHOL 212 (H) 11/25/2019   HDL 85 11/25/2019   LDLCALC 94 11/25/2019   TRIG 197 (H) 11/25/2019   CHOLHDL 2.5 11/25/2019   Lab Results  Component Value Date   WBC 6.1 10/03/2019   HGB 12.6 10/03/2019   HCT 37.9 10/03/2019   MCV 97.4 10/03/2019   PLT 354 10/03/2019   No results found for: IRON, TIBC, FERRITIN  I, Para March, am acting as Location manager for Georgianne Fick, FNP.  I have reviewed the above documentation for accuracy and completeness, and I agree with the above. -  Georgianne Fick, FNP

## 2020-04-10 ENCOUNTER — Other Ambulatory Visit: Payer: Self-pay | Admitting: Family

## 2020-04-10 DIAGNOSIS — E1169 Type 2 diabetes mellitus with other specified complication: Secondary | ICD-10-CM

## 2020-04-10 MED FILL — SOLIFENACIN SUCCINATE 5 MG: 5 | 30 days supply | Qty: 30 | Fill #1

## 2020-04-11 ENCOUNTER — Ambulatory Visit (INDEPENDENT_AMBULATORY_CARE_PROVIDER_SITE_OTHER): Payer: Medicaid Other | Admitting: Allergy

## 2020-04-11 ENCOUNTER — Other Ambulatory Visit: Payer: Self-pay | Admitting: Allergy

## 2020-04-11 ENCOUNTER — Other Ambulatory Visit: Payer: Self-pay

## 2020-04-11 ENCOUNTER — Encounter: Payer: Self-pay | Admitting: Allergy

## 2020-04-11 VITALS — BP 126/86 | HR 68 | Temp 97.5°F | Resp 18 | Ht 64.0 in | Wt 270.0 lb

## 2020-04-11 DIAGNOSIS — J45991 Cough variant asthma: Secondary | ICD-10-CM

## 2020-04-11 DIAGNOSIS — J3089 Other allergic rhinitis: Secondary | ICD-10-CM

## 2020-04-11 DIAGNOSIS — H1013 Acute atopic conjunctivitis, bilateral: Secondary | ICD-10-CM

## 2020-04-11 MED ORDER — ALBUTEROL SULFATE HFA 108 (90 BASE) MCG/ACT IN AERS: 2.0000 | INHALATION_SPRAY | Freq: Four times a day (QID) | RESPIRATORY_TRACT | 0 refills | Status: DC | PRN

## 2020-04-11 MED ORDER — BUDESONIDE-FORMOTEROL FUMARATE 160-4.5 MCG/ACT IN AERO: 2.0000 | INHALATION_SPRAY | Freq: Two times a day (BID) | RESPIRATORY_TRACT | 5 refills | Status: DC

## 2020-04-11 MED FILL — SYMBICORT 160-4.5 MCG INH: 160-4.5 | 25 days supply | Qty: 10 | Fill #0

## 2020-04-11 MED FILL — PROAIR HFA 90 MCG INHALER: 108 (90 BAS | 25 days supply | Qty: 9 | Fill #0
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
## 2020-04-11 NOTE — Patient Instructions (Addendum)
Cough variant asthma Allergic rhinitis with conjunctivitis GERD  -will obtain environmental allergy panel today.  If mold section is negative then would recommend skin testing to molds when you can be off cetirizine for 3 days prior to testing to confirm -lung function testing today is normal -continue avoidance measures for grass pollen, weed pollen, tree pollen, dust mite and dog.  These are positive on your skin testing from 2018. -have access to albuterol inhaler 2 puffs every 4-6 hours as needed for cough/wheeze/shortness of breath/chest tightness.  May use 15-20 minutes prior to activity.   Monitor frequency of use.   Use pump inhaler with spacer device.  Demonstrated proper use today -recommend starting Symbicort 164mcg 2 puffs twice a day with spacer use.  This is a maintenance asthma medication that should help with cough, wheeze, chest tightness and shortness of breath control -continue cetirizine daily for allergy symptom control -for nasal drainage/post nasal drip recommend use of nasal antihistamine Astelin 1-2 sprays each nostril twice daily.  Nasal drainage/post nasal drip can also lead to cough -continue omeprazole daily for reflux symptom control  Follow-up in 2-3 months or sooner if needed

## 2020-04-12 ENCOUNTER — Telehealth: Payer: Self-pay | Admitting: Internal Medicine

## 2020-04-12 DIAGNOSIS — E1169 Type 2 diabetes mellitus with other specified complication: Secondary | ICD-10-CM

## 2020-04-12 DIAGNOSIS — E669 Obesity, unspecified: Secondary | ICD-10-CM

## 2020-04-12 NOTE — Telephone Encounter (Signed)
Pt has run out of her metformin and does not have enough until her next scheduled appt.  Please let patient know when she can get more.  Please advise and thank you.

## 2020-04-13 ENCOUNTER — Other Ambulatory Visit: Payer: Self-pay | Admitting: Internal Medicine

## 2020-04-13 MED ORDER — METFORMIN HCL 500 MG PO TABS: 500.0000 mg | ORAL_TABLET | Freq: Every day | ORAL | 0 refills | Status: DC

## 2020-04-13 MED FILL — METFORMIN HCL 500 MG TABS: 500 | 90 days supply | Qty: 90 | Fill #0

## 2020-04-13 NOTE — Telephone Encounter (Signed)
Rx sent 

## 2020-04-13 NOTE — Telephone Encounter (Signed)
Could you refill pt medication

## 2020-04-14 LAB — CBC WITH DIFFERENTIAL
Basophils Absolute: 0 10*3/uL (ref 0.0–0.2)
Basos: 1 %
EOS (ABSOLUTE): 0.1 10*3/uL (ref 0.0–0.4)
Eos: 1 %
Hematocrit: 40.5 % (ref 34.0–46.6)
Hemoglobin: 13.6 g/dL (ref 11.1–15.9)
Immature Grans (Abs): 0 10*3/uL (ref 0.0–0.1)
Immature Granulocytes: 0 %
Lymphocytes Absolute: 2.2 10*3/uL (ref 0.7–3.1)
Lymphs: 30 %
MCH: 31.8 pg (ref 26.6–33.0)
MCHC: 33.6 g/dL (ref 31.5–35.7)
MCV: 95 fL (ref 79–97)
Monocytes Absolute: 0.6 10*3/uL (ref 0.1–0.9)
Monocytes: 9 %
Neutrophils Absolute: 4.3 10*3/uL (ref 1.4–7.0)
Neutrophils: 59 %
RBC: 4.28 x10E6/uL (ref 3.77–5.28)
RDW: 13 % (ref 11.7–15.4)
WBC: 7.2 10*3/uL (ref 3.4–10.8)

## 2020-04-14 LAB — ALLERGENS W/TOTAL IGE AREA 2
Alternaria Alternata IgE: <0.1 kU/L
Aspergillus Fumigatus IgE: <0.1 kU/L
Bermuda Grass IgE: 0.45 kU/L — AB
Cat Dander IgE: 0.56 kU/L — AB
Cedar, Mountain IgE: <0.1 kU/L
Cladosporium Herbarum IgE: <0.1 kU/L
Cockroach, German IgE: 2.12 kU/L — AB
Common Silver Birch IgE: 0.12 kU/L — AB
Cottonwood IgE: <0.1 kU/L
D Farinae IgE: 4.42 kU/L — AB
D Pteronyssinus IgE: 3.9 kU/L — AB
Dog Dander IgE: 3.5 kU/L — AB
Elm, American IgE: 0.35 kU/L — AB
IgE (Immunoglobulin E), Serum: 118 IU/mL (ref 6–495)
Johnson Grass IgE: 0.58 kU/L — AB
Maple/Box Elder IgE: 0.14 kU/L — AB
Mouse Urine IgE: <0.1 kU/L
Oak, White IgE: 0.14 kU/L — AB
Pecan, Hickory IgE: 0.32 kU/L — AB
Penicillium Chrysogen IgE: <0.1 kU/L
Pigweed, Rough IgE: 0.12 kU/L — AB
Ragweed, Short IgE: 0.5 kU/L — AB
Sheep Sorrel IgE Qn: <0.1 kU/L
Timothy Grass IgE: 3.39 kU/L — AB
White Mulberry IgE: <0.1 kU/L

## 2020-04-14 LAB — ALLERGEN PROFILE, MOLD
Aureobasidi Pullulans IgE: <0.1 kU/L
Candida Albicans IgE: <0.1 kU/L
M009-IgE Fusarium proliferatum: <0.1 kU/L
M014-IgE Epicoccum purpur: <0.1 kU/L
Mucor Racemosus IgE: <0.1 kU/L
Phoma Betae IgE: <0.1 kU/L
Setomelanomma Rostrat: <0.1 kU/L
Stemphylium Herbarum IgE: <0.1 kU/L

## 2020-04-19 MED FILL — OMEPRAZOLE 20 MG CAP: 20 | 30 days supply | Qty: 30 | Fill #1

## 2020-04-20 MED FILL — METFORMIN HCL 500 MG TABS: 500 | 30 days supply | Qty: 30 | Fill #0

## 2020-04-24 ENCOUNTER — Ambulatory Visit (INDEPENDENT_AMBULATORY_CARE_PROVIDER_SITE_OTHER): Payer: Medicaid Other | Admitting: Family Medicine

## 2020-04-24 ENCOUNTER — Encounter (INDEPENDENT_AMBULATORY_CARE_PROVIDER_SITE_OTHER): Payer: Self-pay | Admitting: Family Medicine

## 2020-04-24 ENCOUNTER — Other Ambulatory Visit: Payer: Self-pay

## 2020-04-24 ENCOUNTER — Other Ambulatory Visit (INDEPENDENT_AMBULATORY_CARE_PROVIDER_SITE_OTHER): Payer: Self-pay | Admitting: Family Medicine

## 2020-04-24 VITALS — BP 105/70 | HR 86 | Temp 98.8°F | Ht 64.0 in | Wt 267.0 lb

## 2020-04-24 DIAGNOSIS — Z599 Problem related to housing and economic circumstances, unspecified: Secondary | ICD-10-CM

## 2020-04-24 DIAGNOSIS — Z59819 Housing instability, housed unspecified: Secondary | ICD-10-CM

## 2020-04-24 DIAGNOSIS — E1169 Type 2 diabetes mellitus with other specified complication: Secondary | ICD-10-CM

## 2020-04-24 DIAGNOSIS — Z6841 Body Mass Index (BMI) 40.0 and over, adult: Secondary | ICD-10-CM

## 2020-04-24 DIAGNOSIS — E559 Vitamin D deficiency, unspecified: Secondary | ICD-10-CM

## 2020-04-24 MED ORDER — VITAMIN D (ERGOCALCIFEROL) 1.25 MG (50000 UNIT) PO CAPS: 50000.0000 [IU] | ORAL_CAPSULE | ORAL | 0 refills | Status: DC

## 2020-04-24 MED FILL — VIT D2 1.25 MG (50,000 UNIT: 1.25 MG | 28 days supply | Qty: 4 | Fill #0

## 2020-04-26 ENCOUNTER — Other Ambulatory Visit: Payer: Self-pay | Admitting: Internal Medicine

## 2020-04-26 ENCOUNTER — Encounter: Payer: Self-pay | Admitting: Internal Medicine

## 2020-04-26 ENCOUNTER — Ambulatory Visit: Payer: Medicaid Other | Attending: Internal Medicine | Admitting: Internal Medicine

## 2020-04-26 ENCOUNTER — Other Ambulatory Visit: Payer: Self-pay

## 2020-04-26 DIAGNOSIS — N3281 Overactive bladder: Secondary | ICD-10-CM | POA: Diagnosis not present

## 2020-04-26 DIAGNOSIS — E1169 Type 2 diabetes mellitus with other specified complication: Secondary | ICD-10-CM | POA: Diagnosis not present

## 2020-04-26 DIAGNOSIS — J45991 Cough variant asthma: Secondary | ICD-10-CM | POA: Insufficient documentation

## 2020-04-26 DIAGNOSIS — K219 Gastro-esophageal reflux disease without esophagitis: Secondary | ICD-10-CM

## 2020-04-26 DIAGNOSIS — I10 Essential (primary) hypertension: Secondary | ICD-10-CM | POA: Diagnosis not present

## 2020-04-26 HISTORY — DX: Cough variant asthma: J45.991

## 2020-04-26 MED ORDER — LOSARTAN POTASSIUM 100 MG PO TABS: ORAL_TABLET | ORAL | 3 refills | Status: DC

## 2020-04-26 MED ORDER — OMEPRAZOLE 20 MG PO CPDR: 20.0000 mg | DELAYED_RELEASE_CAPSULE | Freq: Every day | ORAL | 3 refills | Status: DC

## 2020-04-26 MED ORDER — METFORMIN HCL 500 MG PO TABS: 500.0000 mg | ORAL_TABLET | Freq: Every day | ORAL | 3 refills | Status: DC

## 2020-04-26 MED ORDER — SOLIFENACIN SUCCINATE 10 MG PO TABS: 10.0000 mg | ORAL_TABLET | Freq: Every day | ORAL | 5 refills | Status: DC

## 2020-04-26 MED ORDER — HYDRALAZINE HCL 50 MG PO TABS: 50.0000 mg | ORAL_TABLET | Freq: Two times a day (BID) | ORAL | 2 refills | Status: DC

## 2020-04-26 MED FILL — LOSARTAN POTASSIUM 100 MG T: 100 | 30 days supply | Qty: 30 | Fill #0

## 2020-04-26 MED FILL — hydrALAZINE HCL 50 MG TABS: 50 | 30 days supply | Qty: 60 | Fill #0

## 2020-04-26 MED FILL — SOLIFENACIN SUCCINATE 10 MG: 10 | 30 days supply | Qty: 30 | Fill #0

## 2020-04-26 NOTE — Progress Notes (Signed)
Virtual Visit via Telephone Note  I connected with Sabrina [name] on 04/26/20 at 8:44 a.m by telephone and verified that I am speaking with the correct person using two identifiers.  Location: Patient: home Provider: office The patient, my CMA Ms. Sallyanne Havers and myself participated in this encounter   I discussed the limitations, risks, security and privacy concerns of performing an evaluation and management service by telephone and the availability of in person appointments. I also discussed with the patient that there may be a patient responsible charge related to this service. The patient expressed understanding and agreed to proceed.   History of Present Illness: Hx of HTN,HL,, DM,hx of + RA factor, Bipolar Disorder(followed at [facility], chronic cough (dx with cough variant asthma and allergic rhinittis by Dr. 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  Not doing as well. Reports she did well at first "then I fell of the wagon."  Wgh has stagnated.  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 -Ozempic prescribed to help decrease appetite. Reports they also want her to look into getting wgh lose surgery also.  Chronic Cough: saw Allergist Dr. Nelva Bush.  Dx with cough variant asthma, allergic rhinitis and GERD.  Prescribed Symbicort.  She is dong better on the inhaler  DM: compliant with Metformin.  Trying not to eat as many carbs.  Not checking BS  HTN:  BP has been good at [facility].  Compliant with Hydralazine and Norvasc and salt restriction No CP/SOB  Overactive bladder: prescribed Vesicare on last visit.  She does not feel it helps  Requesting that her medications be sent to our pharmacy.  Also wanting to know whether we have any assistance with rent payment.  She and her husband are behind on their rent. Outpatient  Encounter Medications as of 04/26/2020  Medication Sig  . albuterol (VENTOLIN HFA) 108 (90 Base) MCG/ACT inhaler Inhale 2 puffs into the lungs every 6 (six) hours as needed for wheezing or shortness of breath.  Marland Kitchen aspirin EC 81 MG tablet Take 1 tablet (81 mg total) by mouth daily. For heart health  . atorvastatin (LIPITOR) 10 MG tablet Take 1 tablet (10 mg total) by mouth daily.  . Blood Glucose Monitoring Suppl (TRUE METRIX METER) w/Device KIT Use as directed  . budesonide-formoterol (SYMBICORT) 160-4.5 MCG/ACT inhaler Inhale 2 puffs into the lungs in the morning and at bedtime.  . cetirizine (ZYRTEC) 10 MG tablet TAKE 1 TABLET (10 MG TOTAL) BY MOUTH DAILY AS NEEDED.  Marland Kitchen divalproex (DEPAKOTE ER) 250 MG 24 hr tablet Take 3 tablets (750 mg total) by mouth at bedtime. For mood stabilization  . glucose blood (TRUE METRIX BLOOD GLUCOSE TEST) test strip Use as instructed  . hydrALAZINE (APRESOLINE) 50 MG tablet Take 1 tablet (50 mg total) by mouth 2 (two) times daily. .  . losartan (COZAAR) 100 MG tablet TAKE 1 TABLET (100 MG TOTAL) BY MOUTH EVERY EVENING.  . metFORMIN (GLUCOPHAGE) 500 MG tablet Take 1 tablet (500 mg total) by mouth daily with breakfast. For diabetes manangement  . omeprazole (PRILOSEC) 20 MG capsule Take 1 capsule (20 mg total) by mouth daily.  . Semaglutide,0.25 or 0.5MG/DOS, (OZEMPIC, 0.25 OR 0.5 MG/DOSE,) 2 MG/1.5ML SOPN Inject 0.25 mg into the skin once a week.  . solifenacin (VESICARE) 5 MG tablet Take 1 tablet (5  mg total) by mouth daily.  Marland Kitchen Spacer/Aero-Holding Chambers DEVI Use Spacer with Albuterol inhaler as needed  . TRUEPLUS LANCETS 28G MISC Use as directed  . Vitamin D, Ergocalciferol, (DRISDOL) 1.25 MG (50000 UNIT) CAPS capsule Take 1 capsule (50,000 Units total) by mouth every 7 (seven) days.   No facility-administered encounter medications on file as of 04/26/2020.      Observations/Objective: Results for orders placed or performed in visit on 04/11/20  Allergens  w/Total IgE Area 2  Result Value Ref Range   Class Description Allergens Comment    IgE (Immunoglobulin E), Serum 118 6 - 495 IU/mL   D Pteronyssinus IgE 3.90 (A) Class III kU/L   D Farinae IgE 4.42 (A) Class IV kU/L   Cat Dander IgE 0.56 (A) Class II kU/L   Dog Dander IgE 3.50 (A) Class III kU/L   [place] Grass IgE 0.45 (A) Class I kU/L   Timothy Grass IgE 3.39 (A) Class III kU/L   Johnson Grass IgE 0.58 (A) Class II kU/L   Cockroach, German IgE 2.12 (A) Class III kU/L   Penicillium Chrysogen IgE <0.10 Class 0 kU/L   Cladosporium Herbarum IgE <0.10 Class 0 kU/L   Aspergillus Fumigatus IgE <0.10 Class 0 kU/L   Alternaria Alternata IgE <0.10 Class 0 kU/L   Maple/Box Elder IgE 0.14 (A) Class 0/I kU/L   Common Silver Wendee Copp IgE 0.12 (A) Class 0/I kU/L   Cedar, [place] IgE <0.10 Class 0 kU/L   Oak, White IgE 0.14 (A) Class 0/I kU/L   Elm, American IgE 0.35 (A) Class I kU/L   Cottonwood IgE <0.10 Class 0 kU/L   Pecan, Hickory IgE 0.32 (A) Class I kU/L   White Mulberry IgE <0.10 Class 0 kU/L   Ragweed, Short IgE 0.50 (A) Class I kU/L   Pigweed, Rough IgE 0.12 (A) Class 0/I kU/L   Sheep Sorrel IgE Qn <0.10 Class 0 kU/L   Mouse Urine IgE <0.10 Class 0 kU/L  CBC With Differential  Result Value Ref Range   WBC 7.2 3.4 - 10.8 x10E3/uL   RBC 4.28 3.77 - 5.28 x10E6/uL   Hemoglobin 13.6 11.1 - 15.9 g/dL   Hematocrit 40.5 34.0 - 46.6 %   MCV 95 79 - 97 fL   MCH 31.8 26.6 - 33.0 pg   MCHC 33.6 31.5 - 35.7 g/dL   RDW 13.0 11.7 - 15.4 %   Neutrophils 59 Not Estab. %   Lymphs 30 Not Estab. %   Monocytes 9 Not Estab. %   Eos 1 Not Estab. %   Basos 1 Not Estab. %   Neutrophils Absolute 4.3 1.4 - 7.0 x10E3/uL   Lymphocytes Absolute 2.2 0.7 - 3.1 x10E3/uL   Monocytes Absolute 0.6 0.1 - 0.9 x10E3/uL   EOS (ABSOLUTE) 0.1 0.0 - 0.4 x10E3/uL   Basophils Absolute 0.0 0.0 - 0.2 x10E3/uL   Immature Granulocytes 0 Not Estab. %   Immature Grans (Abs) 0.0 0.0 - 0.1 x10E3/uL  Allergen Profile, Mold   Result Value Ref Range   Mucor Racemosus IgE <0.10 Class 0 kU/L   Candida Albicans IgE <0.10 Class 0 kU/L   Setomelanomma Rostrat <0.10 Class 0 kU/L   M009-IgE Fusarium proliferatum <0.10 Class 0 kU/L   Aureobasidi Pullulans IgE <0.10 Class 0 kU/L   Phoma Betae IgE <0.10 Class 0 kU/L   M014-IgE Epicoccum purpur <0.10 Class 0 kU/L   Stemphylium Herbarum IgE <0.10 Class 0 kU/L    Lab Results  Component Value Date  HGBA1C 6.1 (H) 12/13/2019    Assessment and Plan: 1. Type 2 diabetes mellitus with morbid obesity (HCC) Last A1c was at goal. Encouraged her to try to eat healthy and follow the program which she is doing with medical weight management.  Discussed and encourage her to move more if only for 15 minutes a day. She plans to start Ozempic if her insurance approves it. - metFORMIN (GLUCOPHAGE) 500 MG tablet; Take 1 tablet (500 mg total) by mouth daily with breakfast. For diabetes manangement  Dispense: 90 tablet; Refill: 3  2. Essential hypertension At goal.  Continue current medications and low-salt diet - hydrALAZINE (APRESOLINE) 50 MG tablet; Take 1 tablet (50 mg total) by mouth 2 (two) times daily. .  Dispense: 60 tablet; Refill: 2 - losartan (COZAAR) 100 MG tablet; TAKE 1 TABLET (100 MG TOTAL) BY MOUTH EVERY EVENING.  Dispense: 30 tablet; Refill: 3  3. Cough variant asthma She has seen the allergist.  Reports that she is doing well on Symbicort inhaler.  4. Overactive bladder Patient agreeable for [place] to try the higher dose of Vesicare to see if she will have better results. - solifenacin (VESICARE) 10 MG tablet; Take 1 tablet (10 mg total) by mouth daily.  Dispense: 30 tablet; Refill: 5  5. Gastroesophageal reflux disease without esophagitis - omeprazole (PRILOSEC) 20 MG capsule; Take 1 capsule (20 mg total) by mouth daily.  Dispense: 30 capsule; Refill: 3  In terms of her needing assistance to pay her rent, I informed her that she may want to speak with social  services to see if they have any programs to help her. Follow Up Instructions: 4 mths   I discussed the assessment and treatment plan with the patient. The patient was provided an opportunity to ask questions and all were answered. The patient agreed with the plan and demonstrated an understanding of the instructions.   The patient was advised to call back or seek an in-person evaluation if the symptoms worsen or if the condition fails to improve as anticipated.  I provided 22 minutes of non-face-to-face time during this encounter.   Karle Plumber, MD

## 2020-04-26 NOTE — Progress Notes (Signed)
Chief Complaint:   OBESITY Sabrina [name] is here to discuss her progress with her obesity treatment plan along with follow-up of her obesity related diagnoses. Sabrina [name] is on the Category 2 Plan and states she is following her eating plan approximately 40% of the time. Sabrina [name] states she is doing 0 minutes 0 times per week.  Today's visit was #: 9 Starting weight: 279 lbs Starting date: 12/13/2019 Today's weight: 267 lbs Today's date: 04/24/2020 Total lbs lost to date: 12 Total lbs lost since last in-office visit:  (+1)  Interim History: Cl[place]e is a bit down mood wise. She recently was diagnosed with asthma. She has financial issues and struggles to pay her bills including rent. She notes she has worked with social work through [employer] and [employer]. She is unable to find a job that pays well that she is suited for. She reports that she has not adhered to the meal plan well. She does tend to snack on Nabs. If she eats out she has [place] or sweet tea. She admits to eating fried foods also.  Subjective:   1. Vitamin D deficiency Sabrina [name]'s Vit D level was low at 15.6. She is on weekly prescription Vit D.  2. Type 2 diabetes mellitus with other specified complication, without long-term current use of insulin ([place]) Sabrina [name] still has not picked up Ozempic. We had decided to start Ozempic to help with polyphagia. She forgets to pick it up. She reports polyphagia. Her diabetes mellitus is well controlled on metformin. Last A1c was 6.1. She does not check her blood sugars at home.  Lab Results  Component Value Date   HGBA1C 6.1 (H) 12/13/2019   HGBA1C 5.5 11/25/2019   HGBA1C 5.5 05/16/2019   Lab Results  Component Value Date   MICROALBUR 0.2 12/07/2015   LDLCALC 94 11/25/2019   CREATININE 0.82 12/13/2019   Lab Results  Component Value Date   INSULIN 13.7 12/13/2019   3. Housing instability Sabrina [name] notes trouble paying her rent and utilities. She is able to afford her  medications due to Medicaid and food due to food stamps.  Assessment/Plan:   1. Vitamin D deficiency Low Vitamin D level contributes to fatigue and are associated with obesity, breast, and colon cancer. We will refill prescription Vitamin D for 1 month. Sabrina [name] will follow-up for routine testing of Vitamin D, at least 2-3 times per year to avoid over-replacement.  - Vitamin D, Ergocalciferol, (DRISDOL) 1.25 MG (50000 UNIT) CAPS capsule; Take 1 capsule (50,000 Units total) by mouth every 7 (seven) days.  Dispense: 4 capsule; Refill: 0  2. Type 2 diabetes mellitus with other specified complication, without long-term current use of insulin (Sabrina [name]) . Sabrina [name] is to pick up Ozempic and start it.  3. Housing instability We will refer to Sabrina [name] Management for assistance for the patient.  Addendum: I later found out pt is not a candidate.  - AMB Referral to [facility]  4. Class 3 severe obesity with serious comorbidity and body mass index (BMI) of 45.0 to 49.9 in adult, unspecified obesity type Sabrina [name]) Sabrina [name] is currently in the action stage of change. As such, her goal is to continue with weight loss efforts. She has agreed to the Category 2 Plan.   Exercise goals: No exercise has been prescribed at this time.  Behavioral modification strategies: increasing lean protein intake and decreasing simple carbohydrates.  Sabrina [name] has agreed to follow-up with our clinic in 3 weeks.   Objective:   Blood  pressure 105/70, pulse 86, temperature 98.8 F (37.1 C), height 5\' 4"  (1.626 m), weight 267 lb (121.1 kg), SpO2 98 %. Body mass index is 45.83 kg/m.  General: Cooperative, alert, well developed, in no acute distress. HEENT: Conjunctivae and lids unremarkable. Cardiovascular: Regular rhythm.  Lungs: Normal work of breathing. Neurologic: No focal deficits.   Lab Results  Component Value Date   CREATININE 0.82 12/13/2019   BUN 11 12/13/2019   NA 139 12/13/2019   K 4.6 12/13/2019   CL 99  12/13/2019   CO2 22 12/13/2019   Lab Results  Component Value Date   ALT 28 12/13/2019   AST 25 12/13/2019   ALKPHOS 86 12/13/2019   BILITOT 0.3 12/13/2019   Lab Results  Component Value Date   HGBA1C 6.1 (H) 12/13/2019   HGBA1C 5.5 11/25/2019   HGBA1C 5.5 05/16/2019   HGBA1C 5.5 09/06/2018   HGBA1C 5.7 03/15/2018   Lab Results  Component Value Date   INSULIN 13.7 12/13/2019   Lab Results  Component Value Date   TSH 1.610 12/13/2019   Lab Results  Component Value Date   CHOL 212 (H) 11/25/2019   HDL 85 11/25/2019   LDLCALC 94 11/25/2019   TRIG 197 (H) 11/25/2019   CHOLHDL 2.5 11/25/2019   Lab Results  Component Value Date   WBC 7.2 04/11/2020   HGB 13.6 04/11/2020   HCT 40.5 04/11/2020   MCV 95 04/11/2020   PLT 354 10/03/2019   No results found for: IRON, TIBC, FERRITIN  Attestation Statements:   Reviewed by clinician on day of visit: allergies, medications, problem list, medical history, surgical history, family history, social history, and previous encounter notes.   Wilhemena Durie, am acting as Location manager for [employer], FNP-C.  I have reviewed the above documentation for accuracy and completeness, and I agree with the above. -  Georgianne Fick, FNP

## 2020-04-30 ENCOUNTER — Encounter (INDEPENDENT_AMBULATORY_CARE_PROVIDER_SITE_OTHER): Payer: Self-pay | Admitting: Family Medicine

## 2020-05-02 MED FILL — VIT D2 1.25 MG (50,000 UNIT: 1.25 MG | 28 days supply | Qty: 4 | Fill #0

## 2020-05-02 MED FILL — CETIRIZINE HCL 10 MG TABS: 10 | 30 days supply | Qty: 30 | Fill #3

## 2020-05-15 ENCOUNTER — Encounter (INDEPENDENT_AMBULATORY_CARE_PROVIDER_SITE_OTHER): Payer: Self-pay

## 2020-05-22 ENCOUNTER — Encounter (INDEPENDENT_AMBULATORY_CARE_PROVIDER_SITE_OTHER): Payer: Self-pay | Admitting: Family Medicine

## 2020-05-22 ENCOUNTER — Other Ambulatory Visit: Payer: Self-pay

## 2020-05-22 ENCOUNTER — Ambulatory Visit (INDEPENDENT_AMBULATORY_CARE_PROVIDER_SITE_OTHER): Payer: Medicaid Other | Admitting: Family Medicine

## 2020-05-22 VITALS — BP 118/76 | HR 68 | Temp 98.4°F | Ht 64.0 in | Wt 265.0 lb

## 2020-05-22 DIAGNOSIS — Z6841 Body Mass Index (BMI) 40.0 and over, adult: Secondary | ICD-10-CM

## 2020-05-22 DIAGNOSIS — E559 Vitamin D deficiency, unspecified: Secondary | ICD-10-CM | POA: Diagnosis not present

## 2020-05-22 DIAGNOSIS — E1169 Type 2 diabetes mellitus with other specified complication: Secondary | ICD-10-CM

## 2020-05-22 MED ORDER — VITAMIN D (ERGOCALCIFEROL) 1.25 MG (50000 UNIT) PO CAPS: 50000.0000 [IU] | ORAL_CAPSULE | ORAL | 0 refills | Status: DC

## 2020-05-22 MED ORDER — TRULICITY 0.75 MG/0.5ML ~~LOC~~ SOAJ: 0.7500 mg | SUBCUTANEOUS | 0 refills | Status: DC

## 2020-05-24 ENCOUNTER — Encounter (INDEPENDENT_AMBULATORY_CARE_PROVIDER_SITE_OTHER): Payer: Self-pay | Admitting: Family Medicine

## 2020-05-24 NOTE — Progress Notes (Signed)
Chief Complaint:   OBESITY Sabrina [name] is here to discuss her progress with her obesity treatment plan along with follow-up of her obesity related diagnoses. Sabrina [name] is on the Category 2 Plan and states she is following her eating plan approximately 10-20% of the time. Sabrina [name] states she is not exercising regularly at this time.  Today's visit was #: 10 Starting weight: 279 lbs Starting date: 12/13/2019 Today's weight: 265 lbs Today's date: 05/22/2020 Total lbs lost to date: 14 lbs Total lbs lost since last in-office visit: 2 lbs  Interim History: Sabrina [name] admits to only following the plan occasionally.  She notes not being in a good frame of mind to work on following a plan.  She is currently seeing a therapist.  She admits to eating out at times.  She struggles with money for rent and is behind in her rent.  Subjective:   1. Type 2 diabetes mellitus with other specified complication, without long-term current use of insulin (H[facility]) Last A1c was 6.1.  Diabetes is well controlled.  We tried to get Ozempic for her, but Medicaid would not cover it.  She endorses hunger throughout the day.  Lab Results  Component Value Date   HGBA1C 6.1 (H) 12/13/2019   HGBA1C 5.5 11/25/2019   HGBA1C 5.5 05/16/2019   Lab Results  Component Value Date   MICROALBUR 0.2 12/07/2015   LDLCALC 94 11/25/2019   CREATININE 0.82 12/13/2019   Lab Results  Component Value Date   INSULIN 13.7 12/13/2019   2. Vitamin D deficiency Vitamin D is very low at 19.6.  On Weekly vitamin D.  Assessment/Plan:   1. Type 2 diabetes mellitus with other specified complication, without long-term current use of insulin (HCC) Will start her on Trulicity 0.17 mg subcutaneously weekly and see if Medicaid covers it.  - Start Dulaglutide (TRULICITY) 5.10 CH/8.5ID SOPN; Inject 0.75 mg into the skin once a week.  Dispense: 2 mL; Refill: 0  2. Vitamin D deficiency Refill vitamin D 50,000 IU weekly.  Prescription sent to pharmacy  today.  - Refill Vitamin D, Ergocalciferol, (DRISDOL) 1.25 MG (50000 UNIT) CAPS capsule; Take 1 capsule (50,000 Units total) by mouth every 7 (seven) days.  Dispense: 12 capsule; Refill: 0  3. Class 3 severe obesity with serious comorbidity and body mass index (BMI) of 45.0 to 49.9 in adult, unspecified obesity type ([place])  Plan:  Change to PC/[place].  She does not adhere to Category 2.  Discussed healthy choices, foods with more protein, cooking methods.  Sabrina [name] is currently in the action stage of change. As such, her goal is to continue with weight loss efforts. She has agreed to practicing portion control and making smarter food choices, such as increasing vegetables and decreasing simple carbohydrates.   Exercise goals: No exercise has been prescribed at this time.  Behavioral modification strategies: increasing lean protein intake, decreasing simple carbohydrates, decreasing eating out and meal planning and cooking strategies.  Sabrina [name] has agreed to follow-up with our clinic in 3 weeks, fasting.   Objective:   Blood pressure 118/76, pulse 68, temperature 98.4 F (36.9 C), height 5\' 4"  (1.626 m), weight 265 lb (120.2 kg), SpO2 98 %. Body mass index is 45.49 kg/m.  General: Cooperative, alert, well developed, in no acute distress. HEENT: Conjunctivae and lids unremarkable. Cardiovascular: Regular rhythm.  Lungs: Normal work of breathing. Neurologic: No focal deficits.   Lab Results  Component Value Date   CREATININE 0.82 12/13/2019   BUN 11 12/13/2019  NA 139 12/13/2019   K 4.6 12/13/2019   CL 99 12/13/2019   CO2 22 12/13/2019   Lab Results  Component Value Date   ALT 28 12/13/2019   AST 25 12/13/2019   ALKPHOS 86 12/13/2019   BILITOT 0.3 12/13/2019   Lab Results  Component Value Date   HGBA1C 6.1 (H) 12/13/2019   HGBA1C 5.5 11/25/2019   HGBA1C 5.5 05/16/2019   HGBA1C 5.5 09/06/2018   HGBA1C 5.7 03/15/2018   Lab Results  Component Value Date   INSULIN 13.7  12/13/2019   Lab Results  Component Value Date   TSH 1.610 12/13/2019   Lab Results  Component Value Date   CHOL 212 (H) 11/25/2019   HDL 85 11/25/2019   LDLCALC 94 11/25/2019   TRIG 197 (H) 11/25/2019   CHOLHDL 2.5 11/25/2019   Lab Results  Component Value Date   WBC 7.2 04/11/2020   HGB 13.6 04/11/2020   HCT 40.5 04/11/2020   MCV 95 04/11/2020   PLT 354 10/03/2019   Attestation Statements:   Reviewed by clinician on day of visit: allergies, medications, problem list, medical history, surgical history, family history, social history, and previous encounter notes.  I, Water quality scientist, CMA, am acting as Location manager for [employer], [place].  I have reviewed the above documentation for accuracy and completeness, and I agree with the above. -  Georgianne Fick, FNP

## 2020-05-28 MED FILL — METFORMIN HCL 500 MG TABS: 500 | 60 days supply | Qty: 60 | Fill #1

## 2020-05-28 MED FILL — OMEPRAZOLE 20 MG CAP: 20 | 30 days supply | Qty: 30 | Fill #2

## 2020-05-28 MED FILL — ATORVASTATIN 10 MG TABLET: 10 | 30 days supply | Qty: 30 | Fill #3

## 2020-06-06 ENCOUNTER — Encounter: Payer: Self-pay | Admitting: Internal Medicine

## 2020-06-07 ENCOUNTER — Other Ambulatory Visit: Payer: Self-pay

## 2020-06-07 ENCOUNTER — Ambulatory Visit: Payer: Medicaid Other | Admitting: Allergy

## 2020-06-07 ENCOUNTER — Encounter: Payer: Self-pay | Admitting: Allergy

## 2020-06-07 VITALS — BP 128/86 | HR 86 | Temp 96.8°F | Resp 16 | Ht 64.0 in | Wt 272.8 lb

## 2020-06-07 DIAGNOSIS — J45991 Cough variant asthma: Secondary | ICD-10-CM | POA: Diagnosis not present

## 2020-06-07 DIAGNOSIS — H1013 Acute atopic conjunctivitis, bilateral: Secondary | ICD-10-CM

## 2020-06-07 DIAGNOSIS — J3089 Other allergic rhinitis: Secondary | ICD-10-CM | POA: Diagnosis not present

## 2020-06-07 DIAGNOSIS — K219 Gastro-esophageal reflux disease without esophagitis: Secondary | ICD-10-CM

## 2020-06-07 MED ORDER — CETIRIZINE HCL 10 MG PO TABS: 10.0000 mg | ORAL_TABLET | Freq: Every day | ORAL | 2 refills | Status: DC

## 2020-06-07 MED ORDER — ALBUTEROL SULFATE (2.5 MG/3ML) 0.083% IN NEBU: INHALATION_SOLUTION | RESPIRATORY_TRACT | 2 refills | Status: DC

## 2020-06-07 MED ORDER — AZELASTINE HCL 0.15 % NA SOLN: NASAL | 5 refills | Status: DC

## 2020-06-07 MED ORDER — MONTELUKAST SODIUM 10 MG PO TABS: 10.0000 mg | ORAL_TABLET | Freq: Every day | ORAL | 2 refills | Status: DC

## 2020-06-07 MED ORDER — BUDESONIDE-FORMOTEROL FUMARATE 160-4.5 MCG/ACT IN AERO
2.0000 | INHALATION_SPRAY | Freq: Two times a day (BID) | RESPIRATORY_TRACT | 5 refills | Status: DC
Start: 2020-06-07 — End: 2020-12-28

## 2020-06-07 NOTE — Patient Instructions (Addendum)
Cough variant asthma Allergic rhinitis with conjunctivitis GERD  -continue avoidance measures for grass pollen, weed pollen, tree pollen, dust mite, cockroach, cat, and dog.  - mold allergy panel was negative - have access to albuterol inhaler 2 puffs via spacer or albuterol 1 vial via nebulizer every 4-6 hours as needed for cough/wheeze/shortness of breath/chest tightness.  May use 15-20 minutes prior to activity.   Monitor frequency of use.   - nebulizer provided today - use pump inhaler with spacer device.  Demonstrated proper use today - continue Symbicort 130mcg 2 puffs twice a day with spacer use.  This is a maintenance asthma medication that should help with cough, wheeze, chest tightness and shortness of breath control - start Singulair 10mg  daily at bedtime.  If you notice any change in mood/behavior/sleep after starting Singulair then stop this medication and let us know.  Symptoms resolve after stopping the medication.   -continue cetirizine daily for allergy symptom control -for nasal drainage/post nasal drip recommend use of nasal antihistamine Astelin 1-2 sprays each nostril twice daily.  Nasal drainage/post nasal drip can also lead to cough -continue omeprazole daily for reflux symptom control  Let us know if you prefer nebulizer administration versus pump inhaler administration  Follow-up in 3 months or sooner if needed      -CBC is normal without significant eosinophils

## 2020-06-07 NOTE — Progress Notes (Signed)
Follow-up Note  RE: Sabrina [name] MRN: [id number] DOB: Sep 20, 1960 Date of Office Visit: 06/07/2020   History of present illness: Sabrina [name] is a 60 y.o. female presenting today for follow-up of cough variant asthma, allergic rhinitis with conjunctivitis and GERD.  She was last seen in the office on 04/11/2016 by myself. She states she did have some improvement in symptoms after last visit but it was very short lived. She reports she has been having more coughing to the point that she is worried about having a stroke or aneurysm as she is coughing so hard. She recalls being in a store and started coughing related to being around the detergents thus odors can be triggering.  She does feel a tickle in the throat before she starts coughing.  She has been using Symbicort with spacer but she is not sure if she is using the spacer correctly.  She is using albuterol as needed due to the cough most days as well.  She does continue to take cetirizine daily.  She states she is using the Astelin nasal spray but is not sure if this is quite helping the nasal drainage.  She will continue to take omeprazole for reflux control.  Review of systems: Review of Systems  Constitutional: Negative.   HENT: Negative.   Eyes: Negative.   Respiratory: Positive for cough.   Cardiovascular: Negative.   Gastrointestinal: Negative.   Musculoskeletal: Negative.   Skin: Negative.   Neurological: Negative.     All other systems negative unless noted above in HPI  Past medical/social/surgical/family history have been reviewed and are unchanged unless specifically indicated below.  No changes  Medication List: Current Outpatient Medications  Medication Sig Dispense Refill  . albuterol (PROVENTIL) (2.5 MG/3ML) 0.083% nebulizer solution Every 4 - 6 hours as needed 3 mL 2  . albuterol (VENTOLIN HFA) 108 (90 Base) MCG/ACT inhaler Inhale 2 puffs into the lungs every 6 (six) hours as needed for  wheezing or shortness of breath. 18 g 0  . aspirin EC 81 MG tablet Take 1 tablet (81 mg total) by mouth daily. For heart health    . atorvastatin (LIPITOR) 10 MG tablet Take 1 tablet (10 mg total) by mouth daily. 90 tablet 3  . Azelastine HCl 0.15 % SOLN 1-2 sprays each nostril twice daily 30 mL 5  . Blood Glucose Monitoring Suppl (TRUE METRIX METER) w/Device KIT Use as directed 1 kit 0  . divalproex (DEPAKOTE ER) 250 MG 24 hr tablet Take 3 tablets (750 mg total) by mouth at bedtime. For mood stabilization 90 tablet 0  . Dulaglutide (TRULICITY) 9.48 NI/6.2VO SOPN Inject 0.75 mg into the skin once a week. 2 mL 0  . glucose blood (TRUE METRIX BLOOD GLUCOSE TEST) test strip Use as instructed 100 each 12  . hydrALAZINE (APRESOLINE) 50 MG tablet Take 1 tablet (50 mg total) by mouth 2 (two) times daily. . 60 tablet 2  . losartan (COZAAR) 100 MG tablet TAKE 1 TABLET (100 MG TOTAL) BY MOUTH EVERY EVENING. 30 tablet 3  . metFORMIN (GLUCOPHAGE) 500 MG tablet Take 1 tablet (500 mg total) by mouth daily with breakfast. For diabetes manangement 90 tablet 3  . montelukast (SINGULAIR) 10 MG tablet Take 1 tablet (10 mg total) by mouth at bedtime. 30 tablet 2  . omeprazole (PRILOSEC) 20 MG capsule Take 1 capsule (20 mg total) by mouth daily. 30 capsule 3  . solifenacin (VESICARE) 10 MG tablet Take 1 tablet (10 mg  total) by mouth daily. 30 tablet 5  . Spacer/Aero-Holding Chambers DEVI Use Spacer with Albuterol inhaler as needed 1 Container 0  . TRUEPLUS LANCETS 28G MISC Use as directed 100 each 6  . Vitamin D, Ergocalciferol, (DRISDOL) 1.25 MG (50000 UNIT) CAPS capsule Take 1 capsule (50,000 Units total) by mouth every 7 (seven) days. 12 capsule 0  . budesonide-formoterol (SYMBICORT) 160-4.5 MCG/ACT inhaler Inhale 2 puffs into the lungs in the morning and at bedtime. 10.2 g 5  . cetirizine (ZYRTEC) 10 MG tablet Take 1 tablet (10 mg total) by mouth daily. 30 tablet 2   No current facility-administered medications  for this visit.     Known medication allergies: Allergies  Allergen Reactions  . Benzonatate Other (See Comments)    Made her cough worse  . Latex Dermatitis and Other (See Comments)    Hands became scaly  . Penicillins Other (See Comments)    Did it involve swelling of the face/tongue/throat, SOB, or low BP? Unk Did it involve sudden or severe rash/hives, skin peeling, or any reaction on the inside of your mouth or nose? Unk Did you need to seek medical attention at a hospital or doctor's office? Unk When did it last happen? "I was little; I don't remember, but my parents told me to never take it." If all above answers are "NO", may proceed with cephalosporin use.    Marland Kitchen Lisinopril Cough  . Oxycodone-Acetaminophen Nausea And Vomiting    Pt thinks she may be able to take with food (??)     Physical examination: Blood pressure 128/86, pulse 86, temperature (!) 96.8 F (36 C), resp. rate 16, height 5' 4" (1.626 m), weight 272 lb 12.8 oz (123.7 kg), SpO2 98 %.  General: Alert, interactive, in no acute distress. HEENT: PERRLA, TMs pearly gray, turbinates minimally edematous without discharge, post-pharynx non erythematous. Neck: Supple without lymphadenopathy. Lungs: Clear to auscultation without wheezing, rhonchi or rales. {no increased work of breathing. CV: Normal S1, S2 without murmurs. Abdomen: Nondistended, nontender. Skin: Warm and dry, without lesions or rashes. Extremities:  No clubbing, cyanosis or edema. Neuro:   Grossly intact.  Diagnositics/Labs: Labs:  Component     Latest Ref Rng & Units 04/11/2020  IgE (Immunoglobulin E), Serum     6 - 495 IU/mL 118  D Pteronyssinus IgE     Class III kU/L 3.90 (A)  D Farinae IgE     Class IV kU/L 4.42 (A)  Cat Dander IgE     Class II kU/L 0.56 (A)  Dog Dander IgE     Class III kU/L 3.50 (A)  [place] Grass IgE     Class I kU/L 0.45 (A)  Timothy Grass IgE     Class III kU/L 3.39 (A)  Johnson Grass IgE     Class II kU/L  0.58 (A)  Cockroach, German IgE     Class III kU/L 2.12 (A)  Penicillium Chrysogen IgE     Class 0 kU/L <0.10  Cladosporium Herbarum IgE     Class 0 kU/L <0.10  Aspergillus Fumigatus IgE     Class 0 kU/L <0.10  Alternaria Alternata IgE     Class 0 kU/L <0.10  Maple/Box Elder IgE     Class 0/I kU/L 0.14 (A)  Common Silver Wendee Copp IgE     Class 0/I kU/L 0.12 (A)  Cedar, Mountain IgE     Class 0 kU/L <0.10  Oak, White IgE     Class 0/I kU/L 0.14 (A)  Elm, American IgE     Class I kU/L 0.35 (A)  Cottonwood IgE     Class 0 kU/L <0.10  Pecan, Hickory IgE     Class I kU/L 0.32 (A)  White Mulberry IgE     Class 0 kU/L <0.10  Ragweed, Short IgE     Class I kU/L 0.50 (A)  Pigweed, Rough IgE     Class 0/I kU/L 0.12 (A)  Sheep Sorrel IgE Qn     Class 0 kU/L <0.10  Mouse Urine IgE     Class 0 kU/L <0.10  WBC     3.4 - 10.8 x10E3/uL 7.2  RBC     3.77 - 5.28 x10E6/uL 4.28  Hemoglobin     11.1 - 15.9 g/dL 13.6  HCT     34.0 - 46.6 % 40.5  MCV     79 - 97 fL 95  MCH     26.6 - 33.0 pg 31.8  MCHC     31.5 - 35.7 g/dL 33.6  RDW     11.7 - 15.4 % 13.0  Neutrophils     Not Estab. % 59  Lymphs     Not Estab. % 30  Monocytes     Not Estab. % 9  Eos     Not Estab. % 1  Basos     Not Estab. % 1  NEUT#     1.4 - 7.0 x10E3/uL 4.3  Lymphocyte #     0.7 - 3.1 x10E3/uL 2.2  Monocytes Absolute     0.1 - 0.9 x10E3/uL 0.6  EOS (ABSOLUTE)     0.0 - 0.4 x10E3/uL 0.1  Basophils Absolute     0.0 - 0.2 x10E3/uL 0.0  Immature Granulocytes     Not Estab. 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  - mold allergy panel was negative - have access to albuterol inhaler 2 puffs via spacer or albuterol 1 vial via nebulizer every 4-6 hours as needed for cough/wheeze/shortness of breath/chest tightness.  May use 15-20 minutes prior to activity.   Monitor frequency of use.   - nebulizer provided today - use pump inhaler with spacer device.  Demonstrated proper use today - continue Symbicort 1102mg 2 puffs twice a day with spacer use.  This is a maintenance asthma medication that should help with cough, wheeze, chest tightness and shortness of breath control - start Singulair 173mdaily at bedtime.  If you notice any change in mood/behavior/sleep after starting Singulair then stop this medication and let usKoreanow.  Symptoms resolve after stopping the medication.   -continue cetirizine daily for allergy symptom control -for nasal drainage/post nasal drip recommend use of nasal antihistamine Astelin 1-2  sprays each nostril twice daily.  Nasal drainage/post nasal drip can also lead to cough -continue omeprazole daily for reflux symptom control  Let us know if you prefer nebulizer administration versus pump inhaler administration  Follow-up in 3 months or sooner if needed  I appreciate the opportunity to take part in Nelly's care. Please do not hesitate to contact me with questions.  Sincerely,   Prudy Feeler, MD Allergy/Immunology [facility] [place] [facility] [place]

## 2020-06-12 ENCOUNTER — Encounter (INDEPENDENT_AMBULATORY_CARE_PROVIDER_SITE_OTHER): Payer: Self-pay | Admitting: Family Medicine

## 2020-06-12 ENCOUNTER — Ambulatory Visit (INDEPENDENT_AMBULATORY_CARE_PROVIDER_SITE_OTHER): Payer: Medicaid Other | Admitting: Family Medicine

## 2020-06-12 ENCOUNTER — Other Ambulatory Visit: Payer: Self-pay

## 2020-06-12 VITALS — BP 142/83 | HR 68 | Temp 98.1°F | Ht 64.0 in | Wt 269.0 lb

## 2020-06-12 DIAGNOSIS — Z6841 Body Mass Index (BMI) 40.0 and over, adult: Secondary | ICD-10-CM

## 2020-06-12 DIAGNOSIS — E1169 Type 2 diabetes mellitus with other specified complication: Secondary | ICD-10-CM | POA: Diagnosis not present

## 2020-06-14 NOTE — Progress Notes (Signed)
Chief Complaint:   OBESITY Sabrina [name] is here to discuss her progress with her obesity treatment plan along with follow-up of her obesity related diagnoses. Sabrina [name] is on practicing portion control and making smarter food choices, such as increasing vegetables and decreasing simple carbohydrates and states she is following her eating plan approximately 50% of the time. Sabrina [name] states she is not exercising regularly.  Today's visit was #: 11 Starting weight: 279 lbs Starting date: 12/13/2019 Today's weight: 269 lbs Today's date: 06/12/2020 Total lbs lost to date: 80 lbs Total lbs lost since last in-office visit: 0  Interim History: [name] is having yogurt and cheese toast or steel cut oats with dried fruit at breakfast.  Sometimes skips lunch due to lack of meal prep.  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  She does not have protein at all meals.  She has some sugar sweetened beverages when driving.  Subjective:   1. Type 2 diabetes mellitus with other specified complication, without long-term current use of insulin (HCC) Well controlled.  Last A1c 6.1 (12/13/2019).  On metformin.  Trulicity added last office visit.  She has picked it up but did not start it yet.  Lab Results  Component Value Date   HGBA1C 6.1 (H) 12/13/2019   HGBA1C 5.5 11/25/2019   HGBA1C 5.5 05/16/2019   Lab Results  Component Value Date   MICROALBUR 0.2 12/07/2015   LDLCALC 94 11/25/2019   CREATININE 0.82 12/13/2019   Lab Results  Component Value Date   INSULIN 13.7 12/13/2019   Assessment/Plan:   1. Type 2 diabetes mellitus with other specified complication, without long-term current use of insulin (HCC) She will start Trulicity.  2. Class 3 severe obesity with serious comorbidity and body mass index (BMI) of 45.0 to 49.9 in adult, unspecified obesity type [facility])  Sabrina [name] is currently in the action stage of change. As such, her goal is to continue with weight loss  efforts. She has agreed to practicing portion control and making smarter food choices, such as increasing vegetables and decreasing simple carbohydrates.   Try protein oatmeal.  Avoid dried fruit in oatmeal.  Pack lunch when driving.  Have protein at all meals.  Exercise goals: All adults should avoid inactivity. Some physical activity is better than none, and adults who participate in any amount of physical activity gain some health benefits.  Behavioral modification strategies: decreasing liquid calories, no skipping meals and meal planning and cooking strategies.  [name] has agreed to follow-up with our clinic in 3 weeks.   Objective:   Blood pressure (!) 142/83, pulse 68, temperature 98.1 F (36.7 C), height 5\' 4"  (1.626 m), weight 269 lb (122 kg), SpO2 100 %. Body mass index is 46.17 kg/m.  General: Cooperative, alert, well developed, in no acute distress. HEENT: Conjunctivae and lids unremarkable. Cardiovascular: Regular rhythm.  Lungs: Normal work of breathing. Neurologic: No focal deficits.   Lab Results  Component Value Date   CREATININE 0.82 12/13/2019   BUN 11 12/13/2019   NA 139 12/13/2019   K 4.6 12/13/2019   CL 99 12/13/2019   CO2 22 12/13/2019   Lab Results  Component Value Date   ALT 28 12/13/2019   AST 25 12/13/2019   ALKPHOS 86 12/13/2019   BILITOT 0.3 12/13/2019   Lab Results  Component Value Date   HGBA1C 6.1 (H) 12/13/2019   HGBA1C 5.5 11/25/2019   HGBA1C 5.5 05/16/2019   HGBA1C 5.5  09/06/2018   HGBA1C 5.7 03/15/2018   Lab Results  Component Value Date   INSULIN 13.7 12/13/2019   Lab Results  Component Value Date   TSH 1.610 12/13/2019   Lab Results  Component Value Date   CHOL 212 (H) 11/25/2019   HDL 85 11/25/2019   LDLCALC 94 11/25/2019   TRIG 197 (H) 11/25/2019   CHOLHDL 2.5 11/25/2019   Lab Results  Component Value Date   WBC 7.2 04/11/2020   HGB 13.6 04/11/2020   HCT 40.5 04/11/2020   MCV 95 04/11/2020   PLT 354  10/03/2019   Attestation Statements:   Reviewed by clinician on day of visit: allergies, medications, problem list, medical history, surgical history, family history, social history, and previous encounter notes.  I, Water quality scientist, CMA, am acting as Location manager for [employer], [place].  I have reviewed the above documentation for accuracy and completeness, and I agree with the above. -  Georgianne Fick, FNP

## 2020-06-15 ENCOUNTER — Telehealth: Payer: Self-pay | Admitting: *Deleted

## 2020-06-15 NOTE — Telephone Encounter (Signed)
PA has been suspended through [employer] for Symbicort and is currently pending approval/ denial.

## 2020-06-17 ENCOUNTER — Encounter (INDEPENDENT_AMBULATORY_CARE_PROVIDER_SITE_OTHER): Payer: Self-pay | Admitting: Family Medicine

## 2020-06-18 NOTE — Telephone Encounter (Signed)
PA was denied for Symbicort. Will wait for determination fax to determine the next best step regarding the inhaler.

## 2020-06-19 MED FILL — CETIRIZINE HCL 10 MG TABS: 10 | 30 days supply | Qty: 30 | Fill #4

## 2020-06-30 ENCOUNTER — Other Ambulatory Visit: Payer: Self-pay

## 2020-07-03 ENCOUNTER — Other Ambulatory Visit: Payer: Self-pay

## 2020-07-03 ENCOUNTER — Encounter (INDEPENDENT_AMBULATORY_CARE_PROVIDER_SITE_OTHER): Payer: Self-pay | Admitting: Family Medicine

## 2020-07-03 ENCOUNTER — Ambulatory Visit (INDEPENDENT_AMBULATORY_CARE_PROVIDER_SITE_OTHER): Payer: Medicaid Other | Admitting: Family Medicine

## 2020-07-03 VITALS — BP 122/82 | HR 71 | Temp 98.8°F | Ht 64.0 in | Wt 266.0 lb

## 2020-07-03 DIAGNOSIS — E1169 Type 2 diabetes mellitus with other specified complication: Secondary | ICD-10-CM

## 2020-07-03 DIAGNOSIS — Z6841 Body Mass Index (BMI) 40.0 and over, adult: Secondary | ICD-10-CM

## 2020-07-03 DIAGNOSIS — E559 Vitamin D deficiency, unspecified: Secondary | ICD-10-CM

## 2020-07-03 MED ORDER — VITAMIN D (ERGOCALCIFEROL) 1.25 MG (50000 UNIT) PO CAPS: 50000.0000 [IU] | ORAL_CAPSULE | ORAL | 0 refills | Status: DC

## 2020-07-09 ENCOUNTER — Encounter (INDEPENDENT_AMBULATORY_CARE_PROVIDER_SITE_OTHER): Payer: Self-pay | Admitting: Family Medicine

## 2020-07-09 NOTE — Progress Notes (Signed)
Chief Complaint:   OBESITY Sabrina [name] is here to discuss her progress with her obesity treatment plan along with follow-up of her obesity related diagnoses. Sabrina [name] is on practicing portion control and making smarter food choices, such as increasing vegetables and decreasing simple carbohydrates and states she is following her eating plan approximately 60% of the time. Sabrina [name] states she is not exercising regularly at this time.  Today's visit was #: 12 Starting weight: 279 lbs  Starting date: 12/13/2019 Today's weight: 266 lbs Today's date: 07/03/2020 Total lbs lost to date: 13 lbs Total lbs lost since last in-office visit: 3 lbs  Interim History: Sabrina [name] notes she has cut out sweets for [place].  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  She notes she is doing well with protein but could do better.    Subjective:   1. Type 2 diabetes mellitus with other specified complication, without long-term current use of insulin [facility]) Assisted with Trulicity injection in office today.  Last A1c was 6.1.  Diabetes is well controlled.  Lab Results  Component Value Date   HGBA1C 6.1 (H) 12/13/2019   HGBA1C 5.5 11/25/2019   HGBA1C 5.5 05/16/2019   Lab Results  Component Value Date   MICROALBUR 0.2 12/07/2015   LDLCALC 94 11/25/2019   CREATININE 0.82 12/13/2019   Lab Results  Component Value Date   INSULIN 13.7 12/13/2019   2. Vitamin D deficiency Vitamin D low at 15.6 (12/13/2019).  On weekly prescription vitamin D.  Assessment/Plan:   1. Type 2 diabetes mellitus with other specified complication, without long-term current use of insulin (HCC) Continue Trulicity 5.63 mg subcutaneously weekly.  We watched YouTube video about administering Trulicity.  2. Vitamin D deficiency Will refill vitamin D 50,000 IU weekly today, as per below.  - Refill Vitamin D, Ergocalciferol, (DRISDOL) 1.25 MG (50000 UNIT) CAPS capsule; Take 1 capsule (50,000 Units total) by mouth every 7 (seven) days.   Dispense: 12 capsule; Refill: 0  3. Class 3 severe obesity with serious comorbidity and body mass index (BMI) of 45.0 to 49.9 in adult, unspecified obesity type [facility])  Sabrina [name] is currently in the action stage of change. As such, her goal is to continue with weight loss efforts. She has agreed to practicing portion control and making smarter food choices, such as increasing vegetables and decreasing simple carbohydrates.   Exercise goals: All adults should avoid inactivity. Some physical activity is better than none, and adults who participate in any amount of physical activity gain some health benefits.   We registered her for the bariatric surgery seminar on April 14.  Behavioral modification strategies: increasing lean protein intake and decreasing simple carbohydrates.  Sabrina [name] has agreed to follow-up with our clinic in 3 weeks.   Objective:   Blood pressure 122/82, pulse 71, temperature 98.8 F (37.1 C), height 5\' 4"  (1.626 m), weight 266 lb (120.7 kg), SpO2 99 %. Body mass index is 45.66 kg/m.  General: Cooperative, alert, well developed, in no acute distress. HEENT: Conjunctivae and lids unremarkable. Cardiovascular: Regular rhythm.  Lungs: Normal work of breathing. Neurologic: No focal deficits.   Lab Results  Component Value Date   CREATININE 0.82 12/13/2019   BUN 11 12/13/2019   NA 139 12/13/2019   K 4.6 12/13/2019   CL 99 12/13/2019   CO2 22 12/13/2019   Lab Results  Component Value Date   ALT 28 12/13/2019   AST 25 12/13/2019   ALKPHOS 86 12/13/2019   BILITOT 0.3  12/13/2019   Lab Results  Component Value Date   HGBA1C 6.1 (H) 12/13/2019   HGBA1C 5.5 11/25/2019   HGBA1C 5.5 05/16/2019   HGBA1C 5.5 09/06/2018   HGBA1C 5.7 03/15/2018   Lab Results  Component Value Date   INSULIN 13.7 12/13/2019   Lab Results  Component Value Date   TSH 1.610 12/13/2019   Lab Results  Component Value Date   CHOL 212 (H) 11/25/2019   HDL 85 11/25/2019   LDLCALC 94  11/25/2019   TRIG 197 (H) 11/25/2019   CHOLHDL 2.5 11/25/2019   Lab Results  Component Value Date   WBC 7.2 04/11/2020   HGB 13.6 04/11/2020   HCT 40.5 04/11/2020   MCV 95 04/11/2020   PLT 354 10/03/2019   Attestation Statements:   Reviewed by clinician on day of visit: allergies, medications, problem list, medical history, surgical history, family history, social history, and previous encounter notes.  I, Water quality scientist, CMA, am acting as Location manager for [employer], [place].  I have reviewed the above documentation for accuracy and completeness, and I agree with the above. -  Georgianne Fick, FNP

## 2020-07-11 ENCOUNTER — Other Ambulatory Visit: Payer: Self-pay

## 2020-07-11 MED FILL — Omeprazole Cap Delayed Release 20 MG: ORAL | 30 days supply | Qty: 30 | Fill #0 | Status: AC

## 2020-07-12 ENCOUNTER — Other Ambulatory Visit: Payer: Self-pay

## 2020-07-18 ENCOUNTER — Other Ambulatory Visit: Payer: Self-pay

## 2020-07-18 MED FILL — Cetirizine HCl Tab 10 MG: ORAL | 30 days supply | Qty: 30 | Fill #0 | Status: AC

## 2020-07-18 MED FILL — Atorvastatin Calcium Tab 10 MG (Base Equivalent): ORAL | 90 days supply | Qty: 90 | Fill #0 | Status: AC

## 2020-07-18 MED FILL — Solifenacin Succinate Tab 10 MG: ORAL | 30 days supply | Qty: 30 | Fill #0 | Status: AC

## 2020-07-19 ENCOUNTER — Other Ambulatory Visit: Payer: Self-pay

## 2020-07-23 ENCOUNTER — Other Ambulatory Visit: Payer: Self-pay

## 2020-07-24 ENCOUNTER — Ambulatory Visit (INDEPENDENT_AMBULATORY_CARE_PROVIDER_SITE_OTHER): Payer: Medicaid Other | Admitting: Family Medicine

## 2020-07-30 ENCOUNTER — Other Ambulatory Visit: Payer: Self-pay

## 2020-07-30 MED FILL — Hydralazine HCl Tab 50 MG: ORAL | 30 days supply | Qty: 60 | Fill #0 | Status: AC

## 2020-07-31 ENCOUNTER — Other Ambulatory Visit: Payer: Self-pay

## 2020-08-01 ENCOUNTER — Other Ambulatory Visit: Payer: Self-pay

## 2020-08-01 MED FILL — Losartan Potassium Tab 100 MG: ORAL | 30 days supply | Qty: 30 | Fill #0 | Status: CN

## 2020-08-08 ENCOUNTER — Other Ambulatory Visit: Payer: Self-pay

## 2020-08-08 MED FILL — Losartan Potassium Tab 100 MG: ORAL | 30 days supply | Qty: 30 | Fill #0 | Status: AC

## 2020-08-16 ENCOUNTER — Other Ambulatory Visit: Payer: Self-pay

## 2020-08-16 MED FILL — Metformin HCl Tab 500 MG: ORAL | 60 days supply | Qty: 60 | Fill #0 | Status: CN

## 2020-08-16 MED FILL — Omeprazole Cap Delayed Release 20 MG: ORAL | 30 days supply | Qty: 30 | Fill #1 | Status: CN

## 2020-08-22 ENCOUNTER — Other Ambulatory Visit: Payer: Self-pay

## 2020-08-22 MED FILL — Cetirizine HCl Tab 10 MG: ORAL | 30 days supply | Qty: 30 | Fill #1 | Status: CN

## 2020-08-23 ENCOUNTER — Other Ambulatory Visit: Payer: Self-pay

## 2020-08-23 MED FILL — Metformin HCl Tab 500 MG: ORAL | 90 days supply | Qty: 90 | Fill #0 | Status: AC

## 2020-08-23 MED FILL — Omeprazole Cap Delayed Release 20 MG: ORAL | 30 days supply | Qty: 30 | Fill #1 | Status: AC

## 2020-08-23 MED FILL — Cetirizine HCl Tab 10 MG: ORAL | 30 days supply | Qty: 30 | Fill #1 | Status: AC

## 2020-08-24 ENCOUNTER — Other Ambulatory Visit: Payer: Self-pay

## 2020-08-24 ENCOUNTER — Ambulatory Visit: Payer: Medicaid Other | Attending: Internal Medicine | Admitting: Internal Medicine

## 2020-08-24 ENCOUNTER — Encounter: Payer: Self-pay | Admitting: Internal Medicine

## 2020-08-24 DIAGNOSIS — N3281 Overactive bladder: Secondary | ICD-10-CM | POA: Diagnosis not present

## 2020-08-24 DIAGNOSIS — Z23 Encounter for immunization: Secondary | ICD-10-CM | POA: Diagnosis not present

## 2020-08-24 DIAGNOSIS — M47819 Spondylosis without myelopathy or radiculopathy, site unspecified: Secondary | ICD-10-CM | POA: Diagnosis not present

## 2020-08-24 DIAGNOSIS — F319 Bipolar disorder, unspecified: Secondary | ICD-10-CM

## 2020-08-24 DIAGNOSIS — E785 Hyperlipidemia, unspecified: Secondary | ICD-10-CM | POA: Diagnosis not present

## 2020-08-24 DIAGNOSIS — Z7182 Exercise counseling: Secondary | ICD-10-CM | POA: Diagnosis not present

## 2020-08-24 DIAGNOSIS — E1159 Type 2 diabetes mellitus with other circulatory complications: Secondary | ICD-10-CM | POA: Diagnosis not present

## 2020-08-24 DIAGNOSIS — Z888 Allergy status to other drugs, medicaments and biological substances status: Secondary | ICD-10-CM | POA: Insufficient documentation

## 2020-08-24 DIAGNOSIS — Z7982 Long term (current) use of aspirin: Secondary | ICD-10-CM | POA: Diagnosis not present

## 2020-08-24 DIAGNOSIS — J45909 Unspecified asthma, uncomplicated: Secondary | ICD-10-CM | POA: Diagnosis not present

## 2020-08-24 DIAGNOSIS — Z91040 Latex allergy status: Secondary | ICD-10-CM | POA: Insufficient documentation

## 2020-08-24 DIAGNOSIS — Z638 Other specified problems related to primary support group: Secondary | ICD-10-CM | POA: Insufficient documentation

## 2020-08-24 DIAGNOSIS — Z6841 Body Mass Index (BMI) 40.0 and over, adult: Secondary | ICD-10-CM | POA: Insufficient documentation

## 2020-08-24 DIAGNOSIS — Z7984 Long term (current) use of oral hypoglycemic drugs: Secondary | ICD-10-CM | POA: Insufficient documentation

## 2020-08-24 DIAGNOSIS — M1711 Unilateral primary osteoarthritis, right knee: Secondary | ICD-10-CM | POA: Insufficient documentation

## 2020-08-24 DIAGNOSIS — Z880 Allergy status to penicillin: Secondary | ICD-10-CM | POA: Diagnosis not present

## 2020-08-24 DIAGNOSIS — E1169 Type 2 diabetes mellitus with other specified complication: Secondary | ICD-10-CM

## 2020-08-24 DIAGNOSIS — Z79899 Other long term (current) drug therapy: Secondary | ICD-10-CM | POA: Insufficient documentation

## 2020-08-24 DIAGNOSIS — Z7951 Long term (current) use of inhaled steroids: Secondary | ICD-10-CM | POA: Insufficient documentation

## 2020-08-24 DIAGNOSIS — I10 Essential (primary) hypertension: Secondary | ICD-10-CM | POA: Diagnosis not present

## 2020-08-24 DIAGNOSIS — Z6379 Other stressful life events affecting family and household: Secondary | ICD-10-CM

## 2020-08-24 LAB — POCT GLYCOSYLATED HEMOGLOBIN (HGB A1C): HbA1c, POC (controlled diabetic range): 5.5 % (ref 0.0–7.0)

## 2020-08-24 LAB — GLUCOSE, POCT (MANUAL RESULT ENTRY): POC Glucose: 153 mg/dl — AB (ref 70–99)

## 2020-08-24 NOTE — Progress Notes (Signed)
Pt states she is having joint point

## 2020-08-24 NOTE — Patient Instructions (Addendum)
If you do decide to start the Trulicity, you can stop the metformin.  Trulicity will help with weight loss.  I have referred you to the urologist to help with management of the overactive bladder since use of Vesicare has not made a difference.  Zoster Vaccine, Recombinant injection What is this medicine? ZOSTER VACCINE (ZOS ter vak SEEN) is a vaccine used to reduce the risk of getting shingles. This vaccine is not used to treat shingles or nerve pain from shingles. This medicine may be used for other purposes; ask your health care provider or pharmacist if you have questions. COMMON BRAND NAME(S): [facility] What should I tell my health care provider before I take this medicine? They need to know if you have any of these conditions:  cancer  immune system problems  an unusual or allergic reaction to Zoster vaccine, other medications, foods, dyes, or preservatives  pregnant or trying to get pregnant  breast-feeding How should I use this medicine? This vaccine is injected into a muscle. It is given by a health care provider. A copy of Vaccine Information Statements will be given before each vaccination. Be sure to read this information carefully each time. This sheet may change often. Talk to your health care provider about the use of this vaccine in children. This vaccine is not approved for use in children. Overdosage: If you think you have taken too much of this medicine contact a poison control center or emergency room at once. NOTE: This medicine is only for you. Do not share this medicine with others. What if I miss a dose? Keep appointments for follow-up (booster) doses. It is important not to miss your dose. Call your health care provider if you are unable to keep an appointment. What may interact with this medicine?  medicines that suppress your immune system  medicines to treat cancer  steroid medicines like prednisone or cortisone This list may not describe all possible  interactions. Give your health care provider a list of all the medicines, herbs, non-prescription drugs, or dietary supplements you use. Also tell them if you smoke, drink alcohol, or use illegal drugs. Some items may interact with your medicine. What should I watch for while using this medicine? Visit your health care provider regularly. This vaccine, like all vaccines, may not fully protect everyone. What side effects may I notice from receiving this medicine? Side effects that you should report to your doctor or health care professional as soon as possible:  allergic reactions (skin rash, itching or hives; swelling of the face, lips, or tongue)  trouble breathing Side effects that usually do not require medical attention (report these to your doctor or health care professional if they continue or are bothersome):  chills  headache  fever  nausea  pain, redness, or irritation at site where injected  tiredness  vomiting This list may not describe all possible side effects. Call your doctor for medical advice about side effects. You may report side effects to FDA at 1-800-FDA-1088. Where should I keep my medicine? This vaccine is only given by a health care provider. It will not be stored at home. NOTE: This sheet is a summary. It may not cover all possible information. If you have questions about this medicine, talk to your doctor, pharmacist, or health care provider.  2021 Elsevier/Gold Standard (2019-04-22 16:23:07)
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
## 2020-08-25 LAB — MICROALBUMIN / CREATININE URINE RATIO
Creatinine, Urine: 109 mg/dL
Microalb/Creat Ratio: <3 mg/g creat (ref 0–29)
Microalbumin, Urine: <3 ug/mL

## 2020-08-28 ENCOUNTER — Telehealth: Payer: Self-pay

## 2020-08-28 NOTE — Telephone Encounter (Signed)
Contacted pt to go over urine results pt is aware and doesn't have any questions or concerns

## 2020-09-03 ENCOUNTER — Other Ambulatory Visit (HOSPITAL_BASED_OUTPATIENT_CLINIC_OR_DEPARTMENT_OTHER): Payer: Self-pay

## 2020-09-03 ENCOUNTER — Other Ambulatory Visit: Payer: Self-pay

## 2020-09-03 ENCOUNTER — Ambulatory Visit: Payer: Medicaid Other | Attending: Internal Medicine

## 2020-09-03 ENCOUNTER — Other Ambulatory Visit (HOSPITAL_COMMUNITY): Payer: Self-pay

## 2020-09-03 DIAGNOSIS — Z23 Encounter for immunization: Secondary | ICD-10-CM

## 2020-09-03 MED ORDER — PFIZER-BIONT COVID-19 VAC-TRIS 30 MCG/0.3ML IM SUSP
INTRAMUSCULAR | 0 refills | Status: DC
  Filled 2020-09-03: qty 0.3, 1d supply, fill #0

## 2020-09-03 MED FILL — Solifenacin Succinate Tab 10 MG: ORAL | 30 days supply | Qty: 30 | Fill #1 | Status: AC

## 2020-09-03 MED FILL — Solifenacin Succinate Tab 10 MG: ORAL | 30 days supply | Qty: 30 | Fill #1 | Status: CN

## 2020-09-03 NOTE — Progress Notes (Signed)
   Covid-19 Vaccination Clinic  Name:  Sabrina [name]    MRN: [id number] DOB: [birth year]  09/03/2020  [name] was observed post Covid-19 immunization for 15 minutes without incident. She was provided with Vaccine Information Sheet and instruction to access the V-Safe system.   [name] was instructed to call 911 with any severe reactions post vaccine: Marland Kitchen Difficulty breathing  . Swelling of face and throat  . A fast heartbeat  . A bad rash all over body  . Dizziness and weakness   Immunizations Administered    Name Date Dose VIS Date Route   PFIZER Comrnaty(Gray TOP) Covid-19 Vaccine 09/03/2020  3:09 PM 0.3 mL 03/08/2020 Intramuscular   Manufacturer: [place]   Lot: [id number]   [place]: [phone]

## 2020-09-04 ENCOUNTER — Other Ambulatory Visit: Payer: Self-pay

## 2020-09-07 ENCOUNTER — Other Ambulatory Visit: Payer: Self-pay

## 2020-09-11 ENCOUNTER — Other Ambulatory Visit: Payer: Self-pay | Admitting: Internal Medicine

## 2020-09-11 ENCOUNTER — Other Ambulatory Visit: Payer: Self-pay

## 2020-09-11 DIAGNOSIS — I10 Essential (primary) hypertension: Secondary | ICD-10-CM

## 2020-09-11 MED ORDER — HYDRALAZINE HCL 50 MG PO TABS
ORAL_TABLET | Freq: Two times a day (BID) | ORAL | 2 refills | Status: DC
  Filled 2020-09-11: qty 60, 30d supply, fill #0
  Filled 2020-10-29: qty 60, 30d supply, fill #1
  Filled 2021-01-03: qty 60, 30d supply, fill #2

## 2020-09-13 ENCOUNTER — Ambulatory Visit (INDEPENDENT_AMBULATORY_CARE_PROVIDER_SITE_OTHER): Payer: Medicaid Other | Admitting: Allergy

## 2020-09-13 ENCOUNTER — Other Ambulatory Visit: Payer: Self-pay

## 2020-09-13 ENCOUNTER — Encounter: Payer: Self-pay | Admitting: Allergy

## 2020-09-13 VITALS — BP 124/80 | HR 69 | Temp 97.2°F | Resp 18 | Ht 64.0 in | Wt 270.0 lb

## 2020-09-13 DIAGNOSIS — K219 Gastro-esophageal reflux disease without esophagitis: Secondary | ICD-10-CM

## 2020-09-13 DIAGNOSIS — H1013 Acute atopic conjunctivitis, bilateral: Secondary | ICD-10-CM | POA: Diagnosis not present

## 2020-09-13 DIAGNOSIS — R059 Cough, unspecified: Secondary | ICD-10-CM

## 2020-09-13 DIAGNOSIS — J3089 Other allergic rhinitis: Secondary | ICD-10-CM

## 2020-09-13 DIAGNOSIS — J45991 Cough variant asthma: Secondary | ICD-10-CM | POA: Diagnosis not present

## 2020-09-13 MED ORDER — ALBUTEROL SULFATE (2.5 MG/3ML) 0.083% IN NEBU
INHALATION_SOLUTION | RESPIRATORY_TRACT | 1 refills | Status: DC
  Filled 2020-09-13: qty 75, 10d supply, fill #0

## 2020-09-13 MED ORDER — ALBUTEROL SULFATE HFA 108 (90 BASE) MCG/ACT IN AERS
2.0000 | INHALATION_SPRAY | Freq: Four times a day (QID) | RESPIRATORY_TRACT | 1 refills | Status: DC | PRN
  Filled 2020-09-13: qty 8.5, 25d supply, fill #0

## 2020-09-13 MED ORDER — CETIRIZINE HCL 10 MG PO TABS
10.0000 mg | ORAL_TABLET | Freq: Every day | ORAL | 5 refills | Status: DC
  Filled 2020-09-13 – 2020-09-24 (×2): qty 30, 30d supply, fill #0
  Filled 2020-11-06 – 2020-11-13 (×2): qty 30, 30d supply, fill #1
  Filled 2020-12-06: qty 90, 90d supply, fill #2
  Filled 2021-04-01: qty 30, 30d supply, fill #3

## 2020-09-13 MED ORDER — BUDESONIDE-FORMOTEROL FUMARATE 160-4.5 MCG/ACT IN AERO
2.0000 | INHALATION_SPRAY | Freq: Two times a day (BID) | RESPIRATORY_TRACT | 5 refills | Status: DC
  Filled 2020-09-13: qty 10.2, 30d supply, fill #0

## 2020-09-13 MED ORDER — AZELASTINE HCL 0.15 % NA SOLN
NASAL | 5 refills | Status: DC
  Filled 2020-09-13: qty 30, 30d supply, fill #0

## 2020-09-13 NOTE — Progress Notes (Signed)
Follow-up Note  RE: Sabrina [name] MRN: [id number] DOB: 1960-10-30 Date of Office Visit: 09/13/2020   History of present illness: Sabrina [name] is a 60 y.o. female presenting today for follow-up of asthma, allergic rinitis with conjunctivitis and gerd.  She was last seen in the office on 06/07/20 by myself.  She states she is doing a bit better than her last visit.  The cough is a little less.  However she is still frustrated with a feeling a tickling sensation in her throat that also causes her to throat clear and leads to cough.  She states they go she going places and if she has to cough then people might look at her 1 year or like she is sick.  She does not like that feeling.  She states she is not regular with symbicort use.  She will use "here and there".  She uses albuterol as needed and states it is not often.  She also has a nebulizer that is still in the box.  She has Astelin but states she has not used in a while.  She does consistently take cetirizine.  I have recommended she start on Singulair at last visit however looks like this never occurred.  It looks like it was discontinued from her medicine list.  However today she does mention that she has a history and issues with depression and anxiety and is under more stress at this time.   Review of systems: Review of Systems  Constitutional: Negative.   HENT:         See HPI  Eyes: Negative.   Respiratory:  Negative for sputum production, shortness of breath and wheezing.        See HPI  Cardiovascular: Negative.   Gastrointestinal: Negative.   Musculoskeletal: Negative.   Skin: Negative.   Neurological: Negative.   Psychiatric/Behavioral:         See HPI   All other systems negative unless noted above in HPI  Past medical/social/surgical/family history have been reviewed and are unchanged unless specifically indicated below.  No changes  Medication List: Current Outpatient Medications  Medication Sig  Dispense Refill   albuterol (PROVENTIL) (2.5 MG/3ML) 0.083% nebulizer solution Every 4 - 6 hours as needed 3 mL 2   albuterol (VENTOLIN HFA) 108 (90 Base) MCG/ACT inhaler INHALE 2 PUFFS INTO THE LUNGS EVERY 6 (SIX) HOURS AS NEEDED FOR WHEEZING OR SHORTNESS OF BREATH. 8.5 g 0   aspirin EC 81 MG tablet Take 1 tablet (81 mg total) by mouth daily. For heart health     atorvastatin (LIPITOR) 10 MG tablet TAKE 1 TABLET (10 MG TOTAL) BY MOUTH DAILY. 90 tablet 3   Azelastine HCl 0.15 % SOLN 1-2 sprays each nostril twice daily 30 mL 5   Blood Glucose Monitoring Suppl (TRUE METRIX METER) w/Device KIT Use as directed 1 kit 0   budesonide-formoterol (SYMBICORT) 160-4.5 MCG/ACT inhaler Inhale 2 puffs into the lungs in the morning and at bedtime. 10.2 g 5   cetirizine (ZYRTEC) 10 MG tablet TAKE 1 TABLET (10 MG TOTAL) BY MOUTH DAILY AS NEEDED. 30 tablet 9   COVID-19 mRNA Vac-TriS, Pfizer, (PFIZER-BIONT COVID-19 VAC-TRIS) SUSP injection Inject into the muscle. 0.3 mL 0   divalproex (DEPAKOTE ER) 250 MG 24 hr tablet Take 3 tablets (750 mg total) by mouth at bedtime. For mood stabilization 90 tablet 0   glucose blood (TRUE METRIX BLOOD GLUCOSE TEST) test strip Use as instructed 100 each 12   hydrALAZINE (  APRESOLINE) 50 MG tablet TAKE 1 TABLET (50 MG TOTAL) BY MOUTH 2 (TWO) TIMES DAILY. . 60 tablet 2   losartan (COZAAR) 100 MG tablet TAKE 1 TABLET (100 MG TOTAL) BY MOUTH EVERY EVENING. 30 tablet 3   metFORMIN (GLUCOPHAGE) 500 MG tablet TAKE 1 TABLET (500 MG TOTAL) BY MOUTH DAILY WITH BREAKFAST. FOR DIABETES MANANGEMENT 90 tablet 3   omeprazole (PRILOSEC) 20 MG capsule TAKE 1 CAPSULE (20 MG TOTAL) BY MOUTH DAILY. 30 capsule 3   solifenacin (VESICARE) 10 MG tablet TAKE 1 TABLET (10 MG TOTAL) BY MOUTH DAILY. 30 tablet 5   Spacer/Aero-Holding Chambers DEVI Use Spacer with Albuterol inhaler as needed 1 Container 0   TRUEPLUS LANCETS 28G MISC Use as directed 100 each 6   Vitamin D, Ergocalciferol, (DRISDOL) 1.25 MG (50000  UNIT) CAPS capsule Take 1 capsule (50,000 Units total) by mouth every 7 (seven) days. 12 capsule 0   cetirizine (ZYRTEC) 10 MG tablet Take 1 tablet (10 mg total) by mouth daily. 30 tablet 2   Dulaglutide (TRULICITY) 0.35 KK/9.3GH SOPN Inject 0.75 mg into the skin once a week. (Patient not taking: Reported on 09/13/2020) 2 mL 0   No current facility-administered medications for this visit.     Known medication allergies: Allergies  Allergen Reactions   Benzonatate Other (See Comments)    Made her cough worse   Latex Dermatitis and Other (See Comments)    Hands became scaly   Penicillins Other (See Comments)    Did it involve swelling of the face/tongue/throat, SOB, or low BP? Unk Did it involve sudden or severe rash/hives, skin peeling, or any reaction on the inside of your mouth or nose? Unk Did you need to seek medical attention at a hospital or doctor's office? Unk When did it last happen? "I was little; I don't remember, but my parents told me to never take it."  If all above answers are "NO", may proceed with cephalosporin use.     Lisinopril Cough   Oxycodone-Acetaminophen Nausea And Vomiting    Pt thinks she may be able to take with food (??)     Physical examination: Blood pressure 124/80, pulse 69, temperature (!) 97.2 F (36.2 C), resp. rate 18, height _0  (1.626 m), weight 270 lb (122.5 kg), SpO2 97 %.  General: Alert, interactive, in no acute distress. HEENT: PERRLA, TMs pearly gray, turbinates non-edematous without discharge, post-pharynx non erythematous. Neck: Supple without lymphadenopathy. Lungs: Clear to auscultation without wheezing, rhonchi or rales. {no increased work of breathing. CV: Normal S1, S2 without murmurs. Abdomen: Nondistended, nontender. Skin: Warm and dry, without lesions or rashes. Extremities:  No clubbing, cyanosis or edema. Neuro:   Grossly intact.  Diagnositics/Labs:  Spirometry: FEV1: 2.13 L 99%, FVC: 2.65 L 97% nonobstructive  pattern, ratio consistent with nonobstructive pattern  Assessment and plan: Cough variant asthma Allergic rhinitis with conjunctivitis GERD  -continue avoidance measures for grass pollen, weed pollen, tree pollen, dust mite, cockroach, cat, and dog.  - mold allergy panel was negative - have access to albuterol inhaler 2 puffs via spacer or albuterol 1 vial via nebulizer every 4-6 hours as needed for cough/wheeze/shortness of breath/chest tightness.  May use 15-20 minutes prior to activity.   Monitor frequency of use.   - use pump inhaler with spacer device (plastic tube).   - use Symbicort 119mg 2 puffs as needed if cough/wheeze/shortness of breath/wheezing.  Symbicort can be used up to 8-10 puffs/day as max dosing.   If you are  not meeting below goals then the Symbicort should be taken 2 puffs twice a day.  -continue cetirizine daily for allergy symptom control -for nasal drainage/post nasal drip recommend use of nasal antihistamine Azelastine (Astelin) 2 sprays each nostril twice daily.  Nasal drainage/post nasal drip can also lead to cough -continue omeprazole daily for reflux symptom control -Use saline rinse kit prior to nasal spray use.  Use distilled water or boil water and bring to room temperature (do not use tap water).      Asthma control goals:  Full participation in all desired activities (may need albuterol before activity) Albuterol use two time or less a week on average (not counting use with activity) Cough interfering with sleep two time or less a month Oral steroids no more than once a year No hospitalizations  Follow-up in 3 months or sooner if needed  I appreciate the opportunity to take part in Sabrina [name]'s care. Please do not hesitate to contact me with questions.  Sincerely,   Prudy Feeler, MD Allergy/Immunology [facility]

## 2020-09-13 NOTE — Patient Instructions (Addendum)
Cough variant asthma Allergic rhinitis with conjunctivitis GERD  -continue avoidance measures for grass pollen, weed pollen, tree pollen, dust mite, cockroach, cat, and dog.  - mold allergy panel was negative - have access to albuterol inhaler 2 puffs via spacer or albuterol 1 vial via nebulizer every 4-6 hours as needed for cough/wheeze/shortness of breath/chest tightness.  May use 15-20 minutes prior to activity.   Monitor frequency of use.   - use pump inhaler with spacer device (plastic tube).   - use Symbicort 160mcg 2 puffs as needed if cough/wheeze/shortness of breath/wheezing.  Symbicort can be used up to 8-10 puffs/day as max dosing.   If you are not meeting below goals then the Symbicort should be taken 2 puffs twice a day.  -continue cetirizine daily for allergy symptom control -for nasal drainage/post nasal drip recommend use of nasal antihistamine Azelastine (Astelin) 2 sprays each nostril twice daily.  Nasal drainage/post nasal drip can also lead to cough -continue omeprazole daily for reflux symptom control -Use saline rinse kit prior to nasal spray use.  Use distilled water or boil water and bring to room temperature (do not use tap water).      Asthma control goals:  Full participation in all desired activities (may need albuterol before activity) Albuterol use two time or less a week on average (not counting use with activity) Cough interfering with sleep two time or less a month Oral steroids no more than once a year No hospitalizations  Follow-up in 3 months or sooner if needed  

## 2020-09-14 ENCOUNTER — Other Ambulatory Visit: Payer: Self-pay

## 2020-09-18 ENCOUNTER — Other Ambulatory Visit: Payer: Self-pay

## 2020-09-24 ENCOUNTER — Other Ambulatory Visit: Payer: Self-pay

## 2020-09-24 MED FILL — Omeprazole Cap Delayed Release 20 MG: ORAL | 30 days supply | Qty: 30 | Fill #2 | Status: AC

## 2020-09-25 ENCOUNTER — Other Ambulatory Visit: Payer: Self-pay

## 2020-09-27 ENCOUNTER — Other Ambulatory Visit: Payer: Self-pay

## 2020-10-04 ENCOUNTER — Other Ambulatory Visit: Payer: Self-pay

## 2020-10-04 MED FILL — Losartan Potassium Tab 100 MG: ORAL | 30 days supply | Qty: 30 | Fill #1 | Status: AC

## 2020-10-04 MED FILL — Solifenacin Succinate Tab 10 MG: ORAL | 30 days supply | Qty: 30 | Fill #2 | Status: AC

## 2020-10-05 ENCOUNTER — Other Ambulatory Visit: Payer: Self-pay

## 2020-10-29 ENCOUNTER — Other Ambulatory Visit: Payer: Self-pay

## 2020-10-29 MED FILL — Omeprazole Cap Delayed Release 20 MG: ORAL | 30 days supply | Qty: 30 | Fill #3 | Status: AC

## 2020-11-02 ENCOUNTER — Other Ambulatory Visit: Payer: Self-pay

## 2020-11-06 ENCOUNTER — Other Ambulatory Visit: Payer: Self-pay

## 2020-11-13 ENCOUNTER — Other Ambulatory Visit (INDEPENDENT_AMBULATORY_CARE_PROVIDER_SITE_OTHER): Payer: Self-pay | Admitting: Family Medicine

## 2020-11-13 ENCOUNTER — Other Ambulatory Visit: Payer: Self-pay

## 2020-11-13 DIAGNOSIS — E559 Vitamin D deficiency, unspecified: Secondary | ICD-10-CM

## 2020-11-20 IMAGING — MG MM BREAST LOCALIZATION CLIP
4 series · 4 of 12 positions shown · non-contrast
Comparison: Previous exam(s).

CLINICAL DATA: Patient underwent biopsy of a right breast mass at
the retroareolar/9 o'clock position.

EXAM:
DIAGNOSTIC RIGHT MAMMOGRAM POST ULTRASOUND BIOPSY

[R ML synth-2D]
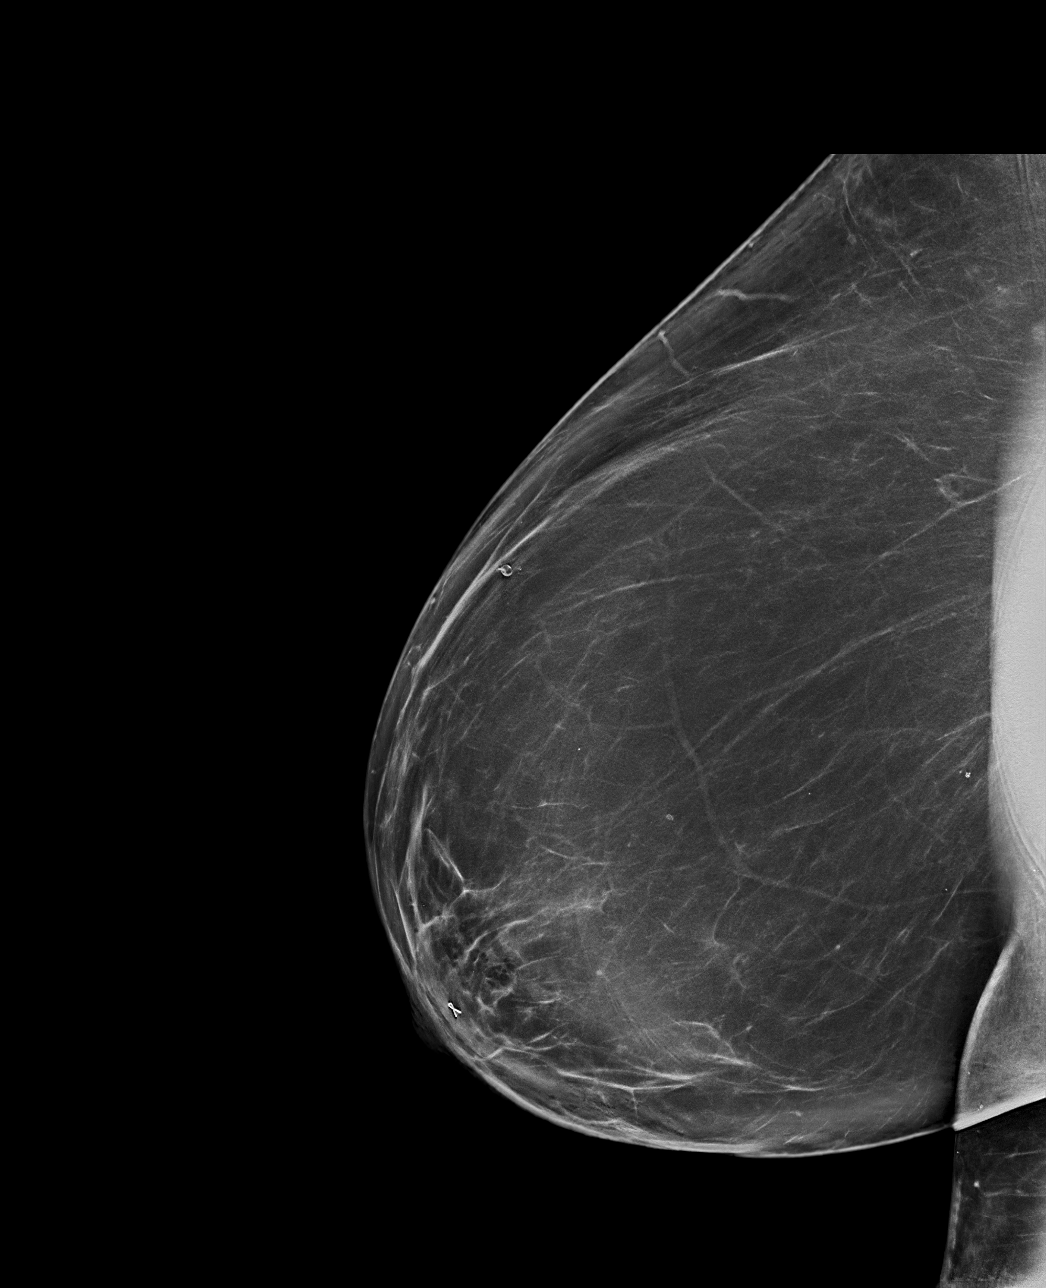

[R CC synth-2D]
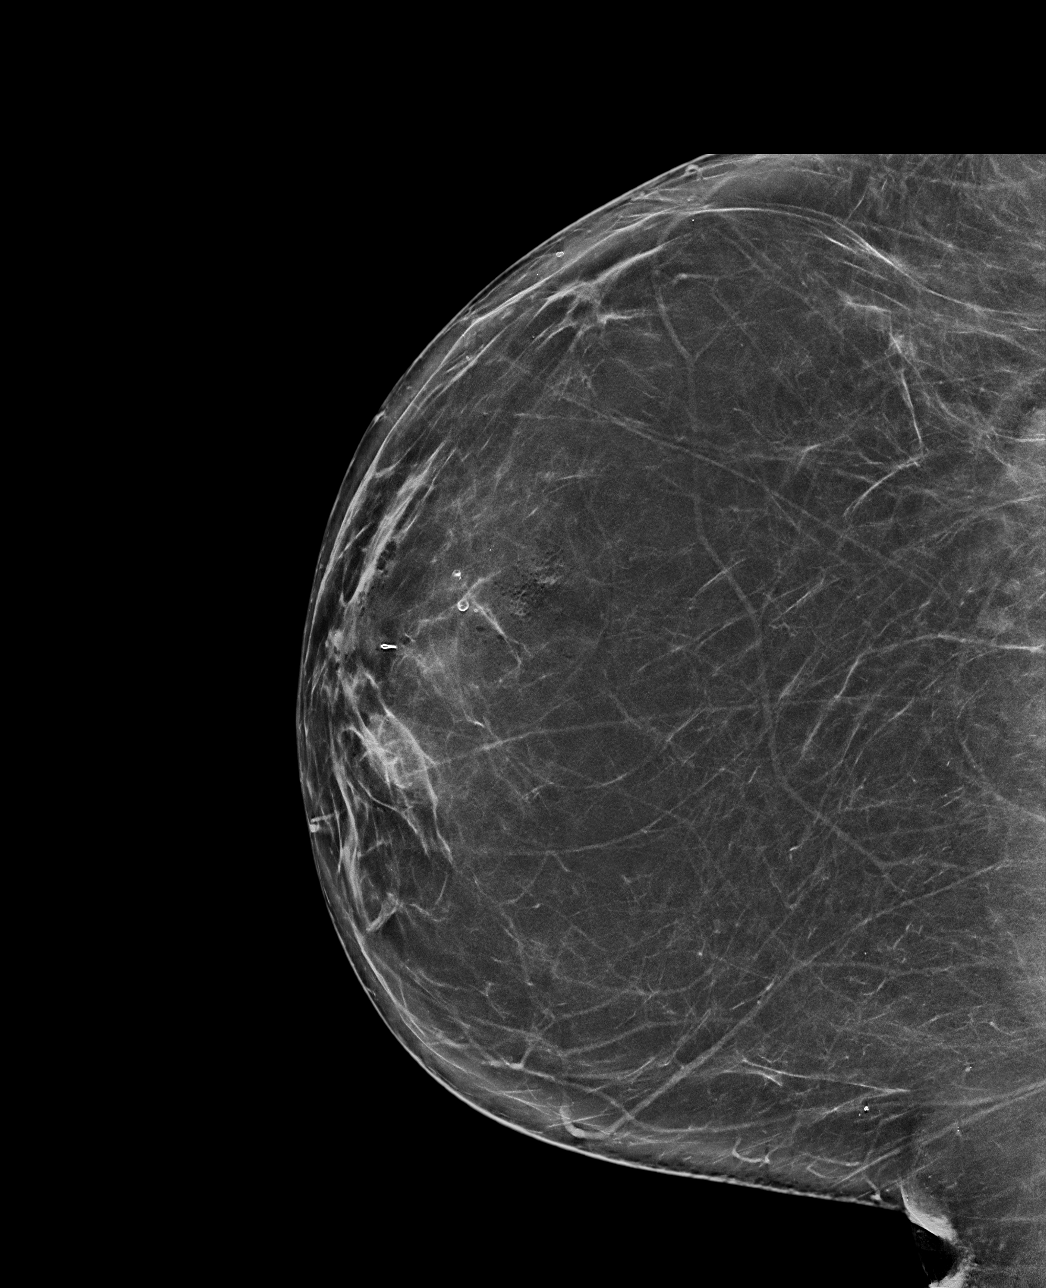

[R ML tomo · tomo slice 46/91.0]
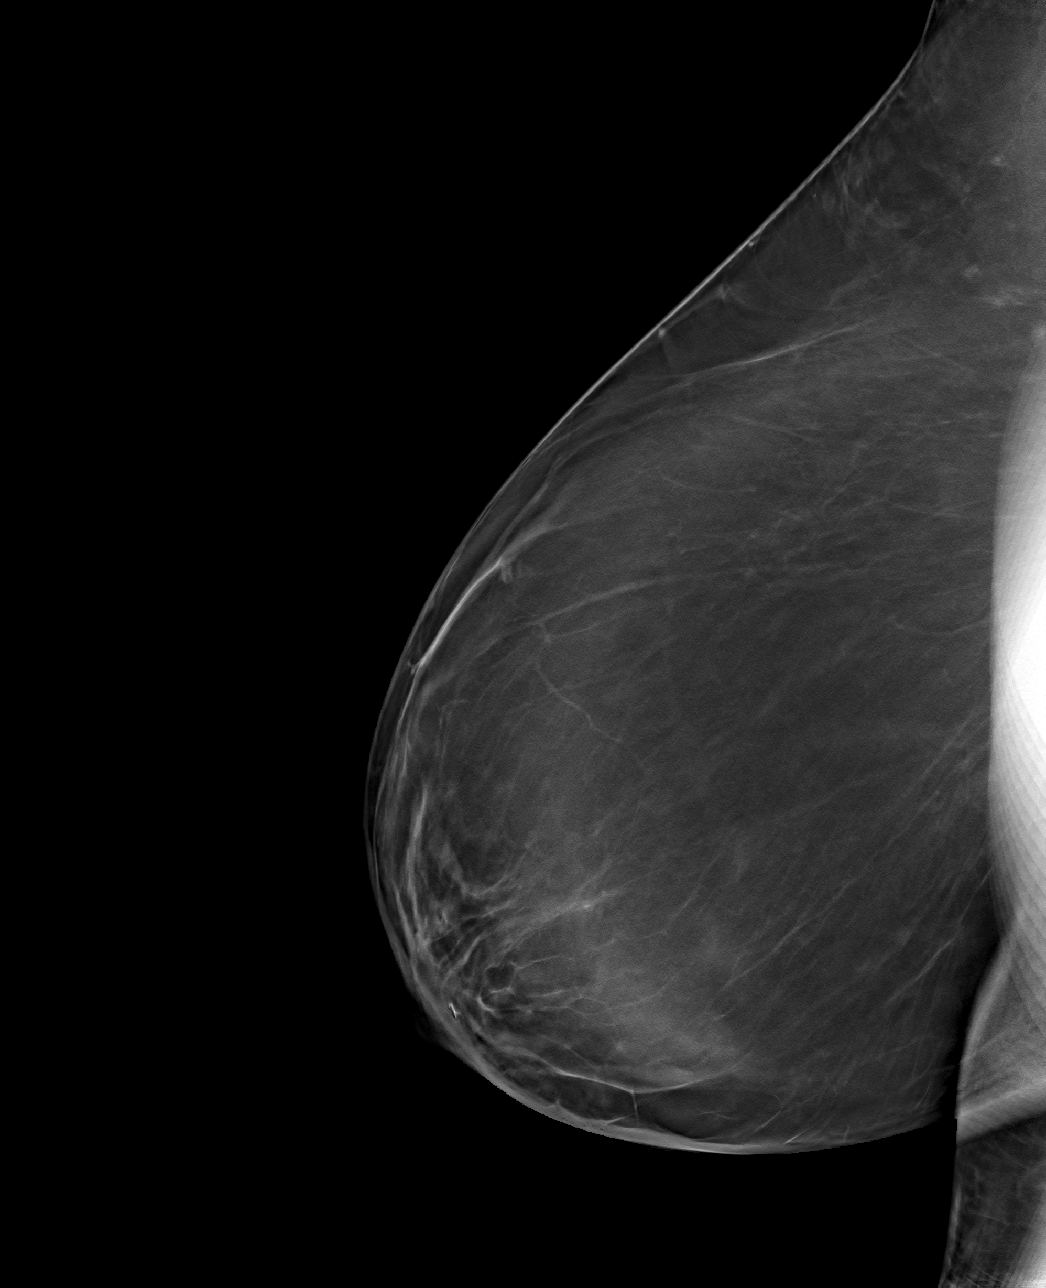

[R CC tomo · tomo slice 41/80.0]
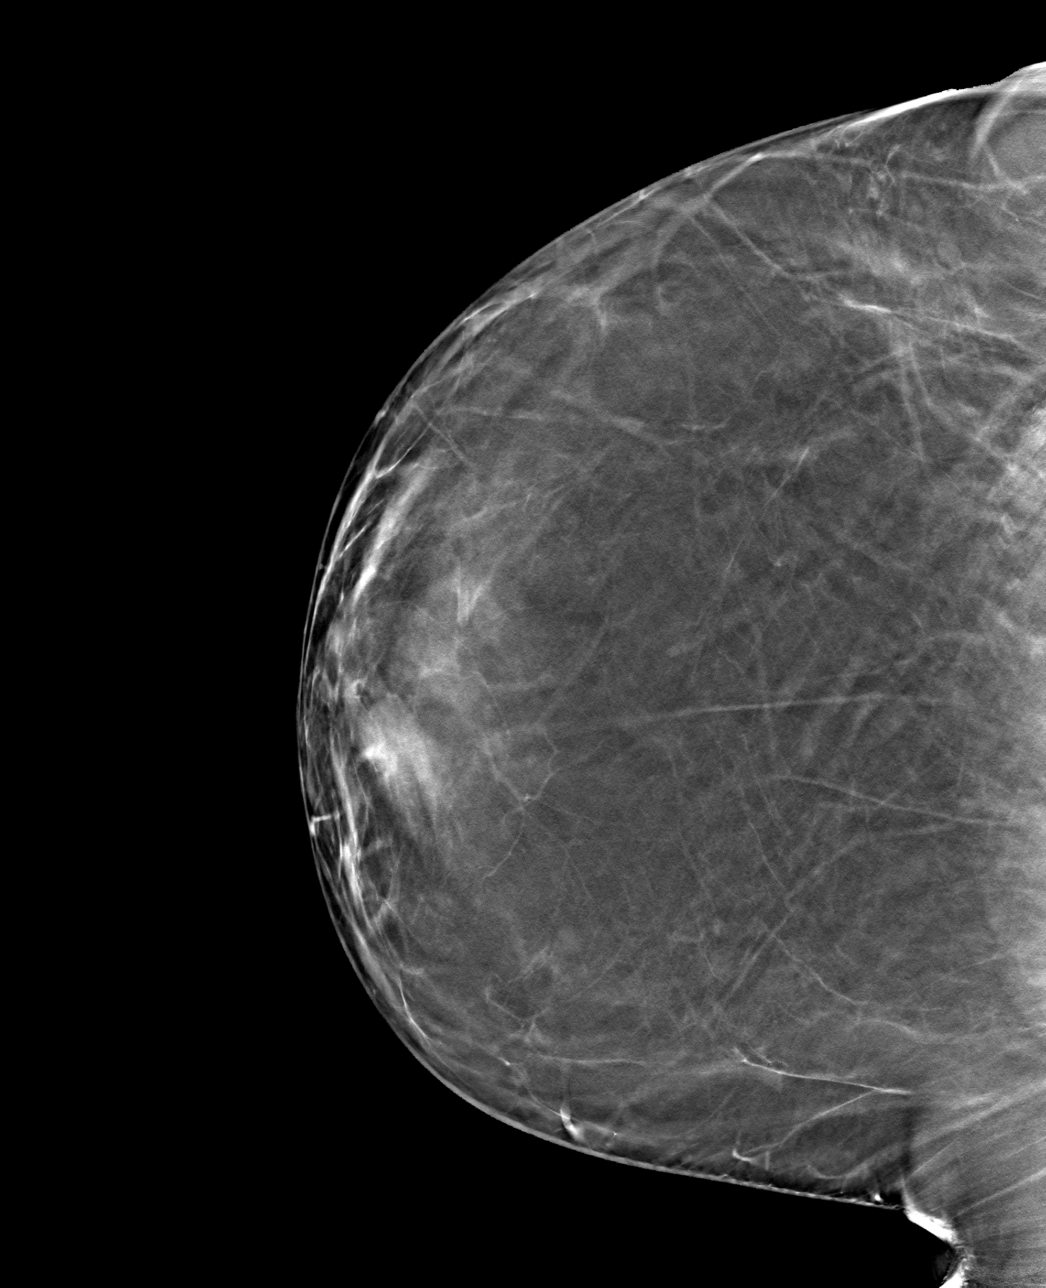

[4 of 12 positions shown; findings below may reference images not displayed]

FINDINGS: Mammographic images were obtained following ultrasound guided biopsy
of a right breast mass at the retroareolar/9 o'clock position. The
biopsy marking clip is in expected position at the site of biopsy.
IMPRESSION: Appropriate positioning of the ribbon shaped biopsy marking clip at
the site of biopsy in the right breast at the retroareolar/9 o'clock
position.

Final Assessment: Post Procedure Mammograms for Marker Placement

## 2020-11-21 MED FILL — Losartan Potassium Tab 100 MG: ORAL | 30 days supply | Qty: 30 | Fill #2 | Status: AC

## 2020-11-22 ENCOUNTER — Other Ambulatory Visit: Payer: Self-pay

## 2020-12-06 ENCOUNTER — Other Ambulatory Visit: Payer: Self-pay

## 2020-12-06 ENCOUNTER — Other Ambulatory Visit: Payer: Self-pay | Admitting: Internal Medicine

## 2020-12-06 DIAGNOSIS — K219 Gastro-esophageal reflux disease without esophagitis: Secondary | ICD-10-CM

## 2020-12-06 MED FILL — Metformin HCl Tab 500 MG: ORAL | 90 days supply | Qty: 90 | Fill #1 | Status: AC

## 2020-12-07 ENCOUNTER — Encounter: Payer: Self-pay | Admitting: Internal Medicine

## 2020-12-07 ENCOUNTER — Other Ambulatory Visit: Payer: Self-pay

## 2020-12-07 DIAGNOSIS — N3281 Overactive bladder: Secondary | ICD-10-CM

## 2020-12-07 MED ORDER — OMEPRAZOLE 20 MG PO CPDR
DELAYED_RELEASE_CAPSULE | Freq: Every day | ORAL | 0 refills | Status: DC
  Filled 2020-12-07: qty 30, 30d supply, fill #0

## 2020-12-11 ENCOUNTER — Other Ambulatory Visit: Payer: Self-pay

## 2020-12-26 ENCOUNTER — Other Ambulatory Visit: Payer: Self-pay | Admitting: Internal Medicine

## 2020-12-26 NOTE — Telephone Encounter (Signed)
Requested medications are due for refill today.  yes  Requested medications are on the active medications list.  yes  Last refill. 12/15/2019  Future visit scheduled.   yes  Notes to clinic.  Failed protocol d/t expired labs.

## 2020-12-27 ENCOUNTER — Other Ambulatory Visit: Payer: Self-pay

## 2020-12-27 MED ORDER — ATORVASTATIN CALCIUM 10 MG PO TABS
ORAL_TABLET | Freq: Every day | ORAL | 0 refills | Status: DC
  Filled 2020-12-27 – 2021-01-03 (×2): qty 30, 30d supply, fill #0

## 2020-12-28 ENCOUNTER — Other Ambulatory Visit: Payer: Self-pay

## 2020-12-28 ENCOUNTER — Ambulatory Visit: Payer: Medicaid Other | Admitting: Allergy

## 2020-12-28 ENCOUNTER — Encounter: Payer: Self-pay | Admitting: Allergy

## 2020-12-28 VITALS — BP 130/72 | HR 71 | Temp 97.3°F | Resp 16 | Ht 64.0 in | Wt 273.0 lb

## 2020-12-28 DIAGNOSIS — J3089 Other allergic rhinitis: Secondary | ICD-10-CM

## 2020-12-28 DIAGNOSIS — H1013 Acute atopic conjunctivitis, bilateral: Secondary | ICD-10-CM | POA: Diagnosis not present

## 2020-12-28 DIAGNOSIS — J45991 Cough variant asthma: Secondary | ICD-10-CM

## 2020-12-28 DIAGNOSIS — K219 Gastro-esophageal reflux disease without esophagitis: Secondary | ICD-10-CM

## 2020-12-28 NOTE — Patient Instructions (Signed)
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  - mold allergy panel was negative - have access to albuterol inhaler 2 puffs via spacer or albuterol 1 vial via nebulizer every 4-6 hours as needed for cough/wheeze/shortness of breath/chest tightness.  May use 15-20 minutes prior to activity.   Monitor frequency of use.   - use pump inhaler with spacer device (plastic tube).   - use Symbicort 133mg 2 puffs twice a day consistently at this time.   This is the medication I want you to absolutely use as directed  - for nasal drainage/post nasal drip can use of nasal antihistamine Azelastine (Astelin) 2 sprays each nostril twice daily.  Nasal drainage/post nasal drip can also lead to cough - can use omeprazole daily for reflux symptom control -Use saline rinse kit prior to nasal spray use.  Use distilled water or boil water and bring to room temperature (do not use tap water).    Asthma control goals:  Full participation in all desired activities (may need albuterol before activity) Albuterol use two time or less a week on average (not counting use with activity) Cough interfering with sleep two time or less a month Oral steroids no more than once a year No hospitalizations  Follow-up in 3 months or sooner if needed

## 2020-12-28 NOTE — Progress Notes (Signed)
Follow-up Note  RE: Sabrina [name] MRN: [id number] DOB: 1960/05/05 Date of Office Visit: 12/28/2020   History of present illness: Lysha Schrade is a 60 y.o. female presenting today for follow-up of cough variant asthma, allergic conjunctivitis and GERD.  She was last seen in the office on 09/13/2020 by myself.  She states she is not doing the best today.  She got a new phone and is having some difficulties operating it.  She also states she is having issues with her bladder in has been having accidental voids.  She is going to be seeing a urogynecologist soon for this issue. She states she still has occasional cough.  She states with her and her husband's finances they are, quite tight and thus she is not taking her medications as directed.  She states she would use the Symbicort maybe twice a week.  She states she has not using the albuterol inhaler as she usually keeps it in her small purse and it is difficult to get out.  Fortunately though she has not had any ED or urgent care visits for breathing related issues nor has she required systemic steroids for this. I have recommended that she take cetirizine as well as azelastine for allergy symptom control.  Is also been recommended that she use omeprazole for reflux control.  With her finances she does not have these medications to use with consistent use.  I did try to inquire if she has to pay out-of-pocket for these medications or if her insurance covers it completely.  She is not sure at this time if she has for coverage sure if she does have to pay some out-of-pocket. She also states she is tired of having to take medications.  She states every issue that she has she has a pill for it.  Review of systems: Review of Systems  Constitutional: Negative.   HENT: Negative.    Eyes: Negative.   Respiratory:  Positive for cough.   Cardiovascular: Negative.   Gastrointestinal: Negative.   Genitourinary:  Positive for frequency  and urgency.  Musculoskeletal: Negative.   Skin: Negative.   Neurological: Negative.    All other systems negative unless noted above in HPI  Past medical/social/surgical/family history have been reviewed and are unchanged unless specifically indicated below.  No changes  Medication List: Current Outpatient Medications  Medication Sig Dispense Refill   albuterol (PROVENTIL) (2.5 MG/3ML) 0.083% nebulizer solution 1 vial via nebulizer every 4-6 hours as needed for cough/wheeze/shortness of breath/chest tightness. 75 mL 1   albuterol (VENTOLIN HFA) 108 (90 Base) MCG/ACT inhaler Inhale 2 puffs into the lungs every 6 (six) hours as needed for wheezing or shortness of breath. 8.5 g 1   aspirin EC 81 MG tablet Take 1 tablet (81 mg total) by mouth daily. For heart health     atorvastatin (LIPITOR) 10 MG tablet TAKE 1 TABLET (10 MG TOTAL) BY MOUTH DAILY. 30 tablet 0   Azelastine HCl 0.15 % SOLN Apply 1-2 sprays each nostril twice daily. 30 mL 5   Blood Glucose Monitoring Suppl (TRUE METRIX METER) w/Device KIT Use as directed 1 kit 0   budesonide-formoterol (SYMBICORT) 160-4.5 MCG/ACT inhaler Inhale 2 puffs into the lungs in the morning and at bedtime. 10.2 g 5   budesonide-formoterol (SYMBICORT) 160-4.5 MCG/ACT inhaler Inhale 2 puffs into the lungs 2 (two) times daily. 2 puffs twice a day with a spacer. Rinse mouth after use. 10.2 g 5   cetirizine (ZYRTEC) 10 MG tablet  Take 1 tablet (10 mg total) by mouth daily. 30 tablet 5   COVID-19 mRNA Vac-TriS, Pfizer, (PFIZER-BIONT COVID-19 VAC-TRIS) SUSP injection Inject into the muscle. 0.3 mL 0   divalproex (DEPAKOTE ER) 250 MG 24 hr tablet Take 3 tablets (750 mg total) by mouth at bedtime. For mood stabilization 90 tablet 0   Dulaglutide (TRULICITY) 0.31 RX/4.5OP SOPN Inject 0.75 mg into the skin once a week. 2 mL 0   glucose blood (TRUE METRIX BLOOD GLUCOSE TEST) test strip Use as instructed 100 each 12   hydrALAZINE (APRESOLINE) 50 MG tablet TAKE 1  TABLET (50 MG TOTAL) BY MOUTH 2 (TWO) TIMES DAILY. . 60 tablet 2   losartan (COZAAR) 100 MG tablet TAKE 1 TABLET (100 MG TOTAL) BY MOUTH EVERY EVENING. 30 tablet 3   metFORMIN (GLUCOPHAGE) 500 MG tablet TAKE 1 TABLET (500 MG TOTAL) BY MOUTH DAILY WITH BREAKFAST. FOR DIABETES MANANGEMENT 90 tablet 3   omeprazole (PRILOSEC) 20 MG capsule TAKE 1 CAPSULE (20 MG TOTAL) BY MOUTH DAILY. 30 capsule 0   solifenacin (VESICARE) 10 MG tablet TAKE 1 TABLET (10 MG TOTAL) BY MOUTH DAILY. 30 tablet 5   Spacer/Aero-Holding Chambers DEVI Use Spacer with Albuterol inhaler as needed 1 Container 0   TRUEPLUS LANCETS 28G MISC Use as directed 100 each 6   Vitamin D, Ergocalciferol, (DRISDOL) 1.25 MG (50000 UNIT) CAPS capsule Take 1 capsule (50,000 Units total) by mouth every 7 (seven) days. 12 capsule 0   cetirizine (ZYRTEC) 10 MG tablet Take 1 tablet (10 mg total) by mouth daily. 30 tablet 2   No current facility-administered medications for this visit.     Known medication allergies: Allergies  Allergen Reactions   Benzonatate Other (See Comments)    Made her cough worse   Latex Dermatitis and Other (See Comments)    Hands became scaly   Penicillins Other (See Comments)    Did it involve swelling of the face/tongue/throat, SOB, or low BP? Unk Did it involve sudden or severe rash/hives, skin peeling, or any reaction on the inside of your mouth or nose? Unk Did you need to seek medical attention at a hospital or doctor's office? Unk When did it last happen? "I was little; I don't remember, but my parents told me to never take it."  If all above answers are "NO", may proceed with cephalosporin use.     Lisinopril Cough   Oxycodone-Acetaminophen Nausea And Vomiting    Pt thinks she may be able to take with food (??)     Physical examination: Blood pressure 130/72, pulse 71, temperature (!) 97.3 F (36.3 C), temperature source Temporal, resp. rate 16, height '5\' 4"'  (1.626 m), weight 273 lb (123.8 kg), SpO2  99 %.  General: Alert, interactive, in no acute distress. HEENT: PERRLA, TMs pearly gray, turbinates non-edematous without discharge, post-pharynx non erythematous. Neck: Supple without lymphadenopathy. Lungs: Clear to auscultation without wheezing, rhonchi or rales. {no increased work of breathing. CV: Normal S1, S2 without murmurs. Abdomen: Nondistended, nontender. Skin: Warm and dry, without lesions or rashes. Extremities:  No clubbing, cyanosis or edema. Neuro:   Grossly intact.  Diagnositics/Labs: None today  Assessment and plan:   Cough variant asthma Allergic rhinitis with conjunctivitis GERD  - continue avoidance measures for grass pollen, weed pollen, tree pollen, dust mite, cockroach, cat, and dog.  - mold allergy panel was negative - have access to albuterol inhaler 2 puffs via spacer or albuterol 1 vial via nebulizer every 4-6 hours as needed  for cough/wheeze/shortness of breath/chest tightness.  May use 15-20 minutes prior to activity.   Monitor frequency of use.   - use pump inhaler with spacer device (plastic tube).   - use Symbicort 158mg 2 puffs twice a day consistently at this time.   This is the medication I want you to absolutely use as directed  - for nasal drainage/post nasal drip can use of nasal antihistamine Azelastine (Astelin) 2 sprays each nostril twice daily.  Nasal drainage/post nasal drip can also lead to cough - can use omeprazole daily for reflux symptom control -Use saline rinse kit prior to nasal spray use.  Use distilled water or boil water and bring to room temperature (do not use tap water).    Asthma control goals:  Full participation in all desired activities (may need albuterol before activity) Albuterol use two time or less a week on average (not counting use with activity) Cough interfering with sleep two time or less a month Oral steroids no more than once a year No hospitalizations  Follow-up in 3 months or sooner if needed  Return  in about 4 months (around 04/29/2021).  I appreciate the opportunity to take part in Omah's care. Please do not hesitate to contact me with questions.  Sincerely,   SPrudy Feeler MD Allergy/Immunology [facility]

## 2021-01-03 ENCOUNTER — Other Ambulatory Visit: Payer: Self-pay | Admitting: Internal Medicine

## 2021-01-03 ENCOUNTER — Other Ambulatory Visit: Payer: Self-pay

## 2021-01-03 DIAGNOSIS — K219 Gastro-esophageal reflux disease without esophagitis: Secondary | ICD-10-CM

## 2021-01-03 DIAGNOSIS — I10 Essential (primary) hypertension: Secondary | ICD-10-CM

## 2021-01-04 ENCOUNTER — Other Ambulatory Visit (HOSPITAL_BASED_OUTPATIENT_CLINIC_OR_DEPARTMENT_OTHER): Payer: Self-pay

## 2021-01-04 ENCOUNTER — Other Ambulatory Visit: Payer: Self-pay

## 2021-01-04 ENCOUNTER — Ambulatory Visit: Payer: Medicaid Other | Admitting: Internal Medicine

## 2021-01-04 NOTE — Telephone Encounter (Signed)
   Notes to clinic Has appt today, please assess.

## 2021-01-08 ENCOUNTER — Other Ambulatory Visit: Payer: Self-pay

## 2021-01-10 ENCOUNTER — Other Ambulatory Visit: Payer: Self-pay

## 2021-01-17 ENCOUNTER — Other Ambulatory Visit: Payer: Self-pay | Admitting: Internal Medicine

## 2021-01-17 DIAGNOSIS — I10 Essential (primary) hypertension: Secondary | ICD-10-CM

## 2021-01-17 DIAGNOSIS — K219 Gastro-esophageal reflux disease without esophagitis: Secondary | ICD-10-CM

## 2021-01-17 NOTE — Telephone Encounter (Signed)
Requested medication (s) are due for refill today Yes  Requested medication (s) are on the active medication list Yes  Future visit scheduled No.  TC to patient to schedule OV-no answer and mailbox full-unable to leave a VM.   Note to clinic-LOV 08/24/20 and requested a 4 month follow-up visit. Routing to physician for review.    Requested Prescriptions  Pending Prescriptions Disp Refills   omeprazole (PRILOSEC) 20 MG capsule 30 capsule 0    Sig: TAKE 1 CAPSULE (20 MG TOTAL) BY MOUTH DAILY.     Gastroenterology: Proton Pump Inhibitors Passed - 01/17/2021 12:16 AM      Passed - Valid encounter within last 12 months    Recent Outpatient Visits           4 months ago Type 2 diabetes mellitus with morbid obesity ([facility])   [place] Karle Plumber B, MD   8 months ago Type 2 diabetes mellitus with morbid obesity [facility])   [place] Ladell Pier, MD   10 months ago Annual physical exam   [place] Ladell Pier, MD   11 months ago Mold suspected exposure   [place] Swords, Darrick Penna, MD   1 year ago Need for influenza vaccination   [place], RPH-CPP       Future Appointments             In 3 weeks Jaquita Folds, MD Urogynecology at [facility], [facility]   In 2 months Kennith Gain, MD [facility] [place]             losartan (COZAAR) 100 MG tablet 30 tablet 3    Sig: TAKE 1 TABLET (100 MG TOTAL) BY MOUTH EVERY EVENING.     Cardiovascular:  Angiotensin Receptor Blockers Failed - 01/17/2021 12:16 AM      Failed - Cr in normal range and within 180 days    Creat  Date Value Ref Range Status  12/05/2015 0.78 0.50 - 1.05 mg/dL Final    Comment:      For patients > or = 59 years of age: The upper reference limit for Creatinine is  approximately 13% higher for people identified as African-American.      Creatinine, Ser  Date Value Ref Range Status  12/13/2019 0.82 0.57 - 1.00 mg/dL Final   Creatinine, Urine  Date Value Ref Range Status  12/07/2015 74 20 - 320 mg/dL Final          Failed - K in normal range and within 180 days    Potassium  Date Value Ref Range Status  12/13/2019 4.6 3.5 - 5.2 mmol/L Final          Passed - Patient is not pregnant      Passed - Last BP in normal range    BP Readings from Last 1 Encounters:  12/28/20 130/72          Passed - Valid encounter within last 6 months    Recent Outpatient Visits           4 months ago Type 2 diabetes mellitus with morbid obesity ([place])   [place] Karle Plumber B, MD   8 months ago Type 2 diabetes mellitus with morbid obesity [facility])   [place],  Dalbert Batman, MD   10 months ago Annual physical exam   [place] Ladell Pier, MD   11 months ago Mold suspected exposure   [place] Swords, Darrick Penna, MD   1 year ago Need for influenza vaccination   [place], RPH-CPP       Future Appointments             In 3 weeks Wannetta Sender Governor Rooks, MD Urogynecology at [facility], [facility]   In 2 months Nelva Bush, Rae Halsted, MD [facility] [place]

## 2021-01-18 ENCOUNTER — Other Ambulatory Visit: Payer: Self-pay

## 2021-01-26 ENCOUNTER — Ambulatory Visit (INDEPENDENT_AMBULATORY_CARE_PROVIDER_SITE_OTHER): Payer: Medicaid Other

## 2021-01-26 ENCOUNTER — Ambulatory Visit (HOSPITAL_COMMUNITY)
Admission: EM | Admit: 2021-01-26 | Discharge: 2021-01-26 | Disposition: A | Discharge status: Home / Self Care | Payer: Medicaid Other | Attending: Emergency Medicine | Admitting: Emergency Medicine

## 2021-01-26 ENCOUNTER — Other Ambulatory Visit: Payer: Self-pay

## 2021-01-26 ENCOUNTER — Encounter (HOSPITAL_COMMUNITY): Payer: Self-pay

## 2021-01-26 DIAGNOSIS — Z20822 Contact with and (suspected) exposure to covid-19: Secondary | ICD-10-CM | POA: Diagnosis not present

## 2021-01-26 DIAGNOSIS — S20211A Contusion of right front wall of thorax, initial encounter: Secondary | ICD-10-CM | POA: Diagnosis present

## 2021-01-26 DIAGNOSIS — Z79899 Other long term (current) drug therapy: Secondary | ICD-10-CM | POA: Diagnosis not present

## 2021-01-26 DIAGNOSIS — Z91040 Latex allergy status: Secondary | ICD-10-CM | POA: Diagnosis not present

## 2021-01-26 DIAGNOSIS — R079 Chest pain, unspecified: Secondary | ICD-10-CM

## 2021-01-26 DIAGNOSIS — M62838 Other muscle spasm: Secondary | ICD-10-CM | POA: Diagnosis present

## 2021-01-26 DIAGNOSIS — Z9109 Other allergy status, other than to drugs and biological substances: Secondary | ICD-10-CM | POA: Diagnosis not present

## 2021-01-26 DIAGNOSIS — K219 Gastro-esophageal reflux disease without esophagitis: Secondary | ICD-10-CM | POA: Diagnosis not present

## 2021-01-26 DIAGNOSIS — Z7982 Long term (current) use of aspirin: Secondary | ICD-10-CM | POA: Insufficient documentation

## 2021-01-26 NOTE — Discharge Instructions (Signed)
You can take Tylenol and/or ibuprofen as needed for pain relief and fever reduction.  Make sure you are drinking plenty of fluids, especially water.  You can use heat, ice, or alternate between heat and ice for comfort.  You may use IcyHot, lidocaine patches, Biofreeze, Aspercreme, or Voltaren gel as needed for pain relief.  We will contact you if your COVID test is positive.  Return or go to the Emergency Department if symptoms worsen or do not improve in the next few days.
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
## 2021-01-27 LAB — SARS CORONAVIRUS 2 (TAT 6-24 HRS): SARS Coronavirus 2: NEGATIVE

## 2021-01-31 ENCOUNTER — Encounter: Payer: Self-pay | Admitting: Internal Medicine

## 2021-02-04 ENCOUNTER — Ambulatory Visit: Payer: Medicaid Other | Admitting: Obstetrics and Gynecology

## 2021-02-04 ENCOUNTER — Other Ambulatory Visit: Payer: Self-pay

## 2021-02-04 ENCOUNTER — Ambulatory Visit: Payer: Medicaid Other | Attending: Internal Medicine | Admitting: Internal Medicine

## 2021-02-04 ENCOUNTER — Encounter: Payer: Self-pay | Admitting: Internal Medicine

## 2021-02-04 DIAGNOSIS — Z1231 Encounter for screening mammogram for malignant neoplasm of breast: Secondary | ICD-10-CM

## 2021-02-04 DIAGNOSIS — Z23 Encounter for immunization: Secondary | ICD-10-CM

## 2021-02-04 DIAGNOSIS — J069 Acute upper respiratory infection, unspecified: Secondary | ICD-10-CM

## 2021-02-04 DIAGNOSIS — E1169 Type 2 diabetes mellitus with other specified complication: Secondary | ICD-10-CM | POA: Diagnosis not present

## 2021-02-04 DIAGNOSIS — I10 Essential (primary) hypertension: Secondary | ICD-10-CM

## 2021-02-04 DIAGNOSIS — Z6841 Body Mass Index (BMI) 40.0 and over, adult: Secondary | ICD-10-CM

## 2021-02-04 DIAGNOSIS — N3946 Mixed incontinence: Secondary | ICD-10-CM | POA: Diagnosis not present

## 2021-02-04 LAB — POCT GLYCOSYLATED HEMOGLOBIN (HGB A1C): HbA1c, POC (controlled diabetic range): 5.7 % (ref 0.0–7.0)
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
## 2021-02-07 ENCOUNTER — Other Ambulatory Visit: Payer: Self-pay

## 2021-02-07 ENCOUNTER — Other Ambulatory Visit: Payer: Self-pay | Admitting: Internal Medicine

## 2021-02-07 ENCOUNTER — Ambulatory Visit
Admission: RE | Admit: 2021-02-07 | Discharge: 2021-02-07 | Disposition: A | Discharge status: Home / Self Care | Payer: Medicaid Other | Source: Ambulatory Visit | Attending: Internal Medicine | Admitting: Internal Medicine

## 2021-02-07 ENCOUNTER — Encounter: Payer: Self-pay | Admitting: Obstetrics and Gynecology

## 2021-02-07 ENCOUNTER — Ambulatory Visit (INDEPENDENT_AMBULATORY_CARE_PROVIDER_SITE_OTHER): Payer: Medicaid Other | Admitting: Obstetrics and Gynecology

## 2021-02-07 VITALS — BP 165/97 | HR 90 | Ht 64.0 in | Wt 270.0 lb

## 2021-02-07 DIAGNOSIS — Z1231 Encounter for screening mammogram for malignant neoplasm of breast: Secondary | ICD-10-CM

## 2021-02-07 DIAGNOSIS — N393 Stress incontinence (female) (male): Secondary | ICD-10-CM | POA: Diagnosis not present

## 2021-02-07 DIAGNOSIS — R350 Frequency of micturition: Secondary | ICD-10-CM

## 2021-02-07 DIAGNOSIS — N3281 Overactive bladder: Secondary | ICD-10-CM

## 2021-02-07 LAB — POCT URINALYSIS DIPSTICK
Appearance: NORMAL
Bilirubin, UA: NEGATIVE
Blood, UA: NEGATIVE
Glucose, UA: NEGATIVE
Ketones, UA: NEGATIVE
Leukocytes, UA: NEGATIVE
Nitrite, UA: NEGATIVE
Protein, UA: NEGATIVE
Spec Grav, UA: 1.025 (ref 1.010–1.025)
Urobilinogen, UA: 0.2 E.U./dL
pH, UA: 6 (ref 5.0–8.0)

## 2021-02-09 NOTE — Patient Instructions (Addendum)
  Allergic rhinitis with conjunctivitis - continue avoidance measures for grass pollen, weed pollen, tree pollen, dust mite, cockroach, cat, and dog.  - Start azelastine (Astelin) 2 sprays each nostril twice daily as needed for runny nose/nasal drainage. This can help with your cough since you are having drainage. -Use saline rinse kit prior to nasal spray use.  Use distilled water or boil water and bring to room temperature (do not use tap water).  -Start fluticasone nasal spray 2 sprays each nostril once a day as needed for stuffy nose  Cough variant asthma - have access to albuterol inhaler 2 puffs via spacer or albuterol 1 vial via nebulizer every 4-6 hours as needed for cough/wheeze/shortness of breath/chest tightness.  May use 15-20 minutes prior to activity.   Monitor frequency of use.   - use pump inhaler with spacer device (plastic tube).   - START Symbicort 160/4.5 mcg 2 puffs twice a day with spacer. Use this every day. Set a reminder on your phone or set it next to your tooth brush as a reminder   Asthma control goals:  Full participation in all desired activities (may need albuterol before activity) Albuterol use two time or less a week on average (not counting use with activity) Cough interfering with sleep two time or less a month Oral steroids no more than once a year No hospitalizations  GERD - can use omeprazole daily for reflux symptom control  Itching Continue to avoid foods that cause itching We will get blood work to follow up on this since you potentially took cetirizine on Friday  Schedule your mammogram since you are due. Also, find out from your primary care physician if you are due for a colonoscopy  Your blood pressure is elevated today. Please schedule an appointment with your primary care physician to discuss  Follow-up in 6 weeks or sooner if needed

## 2021-02-11 ENCOUNTER — Other Ambulatory Visit: Payer: Self-pay

## 2021-02-11 ENCOUNTER — Other Ambulatory Visit: Payer: Self-pay | Admitting: Internal Medicine

## 2021-02-11 ENCOUNTER — Encounter: Payer: Self-pay | Admitting: Family

## 2021-02-11 ENCOUNTER — Ambulatory Visit (INDEPENDENT_AMBULATORY_CARE_PROVIDER_SITE_OTHER): Payer: Medicaid Other | Admitting: Family

## 2021-02-11 VITALS — BP 140/90 | HR 85 | Temp 96.7°F | Resp 20 | Ht 62.5 in | Wt 269.4 lb

## 2021-02-11 DIAGNOSIS — I10 Essential (primary) hypertension: Secondary | ICD-10-CM

## 2021-02-11 DIAGNOSIS — K219 Gastro-esophageal reflux disease without esophagitis: Secondary | ICD-10-CM

## 2021-02-11 DIAGNOSIS — H1013 Acute atopic conjunctivitis, bilateral: Secondary | ICD-10-CM | POA: Diagnosis not present

## 2021-02-11 DIAGNOSIS — L299 Pruritus, unspecified: Secondary | ICD-10-CM | POA: Diagnosis not present

## 2021-02-11 DIAGNOSIS — J3089 Other allergic rhinitis: Secondary | ICD-10-CM | POA: Diagnosis not present

## 2021-02-11 DIAGNOSIS — J45991 Cough variant asthma: Secondary | ICD-10-CM | POA: Diagnosis not present

## 2021-02-11 DIAGNOSIS — Z9889 Other specified postprocedural states: Secondary | ICD-10-CM

## 2021-02-11 MED ORDER — FLUTICASONE PROPIONATE 50 MCG/ACT NA SUSP: NASAL | 5 refills | Status: DC

## 2021-02-11 MED ORDER — OMEPRAZOLE 20 MG PO CPDR
DELAYED_RELEASE_CAPSULE | Freq: Every day | ORAL | 0 refills | Status: DC
  Filled 2021-02-11: qty 30, 30d supply, fill #0

## 2021-02-11 MED ORDER — BUDESONIDE-FORMOTEROL FUMARATE 160-4.5 MCG/ACT IN AERO: 2.0000 | INHALATION_SPRAY | Freq: Two times a day (BID) | RESPIRATORY_TRACT | 5 refills | Status: DC

## 2021-02-11 MED ORDER — AZELASTINE HCL 0.15 % NA SOLN: NASAL | 5 refills | Status: DC

## 2021-02-11 NOTE — Telephone Encounter (Signed)
Requested Prescriptions  Pending Prescriptions Disp Refills  . losartan (COZAAR) 100 MG tablet 30 tablet 3    Sig: TAKE 1 TABLET (100 MG TOTAL) BY MOUTH EVERY EVENING.     Cardiovascular:  Angiotensin Receptor Blockers Failed - 02/11/2021 10:23 AM      Failed - Cr in normal range and within 180 days    Creat  Date Value Ref Range Status  12/05/2015 0.78 0.50 - 1.05 mg/dL Final    Comment:      For patients > or = 59 years of age: The upper reference limit for Creatinine is approximately 13% higher for people identified as African-American.      Creatinine, Ser  Date Value Ref Range Status  12/13/2019 0.82 0.57 - 1.00 mg/dL Final   Creatinine, Urine  Date Value Ref Range Status  12/07/2015 74 20 - 320 mg/dL Final         Failed - K in normal range and within 180 days    Potassium  Date Value Ref Range Status  12/13/2019 4.6 3.5 - 5.2 mmol/L Final         Failed - Last BP in normal range    BP Readings from Last 1 Encounters:  02/11/21 140/90         Passed - Patient is not pregnant      Passed - Valid encounter within last 6 months    Recent Outpatient Visits          1 week ago Type 2 diabetes mellitus with morbid obesity ([place])   [place] Karle Plumber B, MD   5 months ago Type 2 diabetes mellitus with morbid obesity ([place])   [place] Karle Plumber B, MD   9 months ago Type 2 diabetes mellitus with morbid obesity [facility])   [place] Ladell Pier, MD   11 months ago Annual physical exam   [place] Ladell Pier, MD   11 months ago Mold suspected exposure   [place] Swords, Darrick Penna, MD      Future Appointments            In 1 week Daisy Blossom, Jarome Matin, [place]   In 1 month Padgett, Rae Halsted, MD Allergy and [place]    In 2 months Wannetta Sender, Governor Rooks, MD Urogynecology at [facility], [facility]   In 3 months Ladell Pier, MD [place]           . omeprazole (PRILOSEC) 20 MG capsule 30 capsule 0    Sig: TAKE 1 CAPSULE (20 MG TOTAL) BY MOUTH DAILY.     Gastroenterology: Proton Pump Inhibitors Passed - 02/11/2021 10:23 AM      Passed - Valid encounter within last 12 months    Recent Outpatient Visits          1 week ago Type 2 diabetes mellitus with morbid obesity ([place])   [place] Karle Plumber B, MD   5 months ago Type 2 diabetes mellitus with morbid obesity [facility])   [place] Karle Plumber B, MD   9 months ago Type 2 diabetes mellitus with morbid obesity [facility])   [place] [facility] Ladell Pier, MD   11 months ago Annual physical  exam   [place], MD   11 months ago Mold suspected exposure   [place] Swords, Darrick Penna, MD      Future Appointments            In 1 week Daisy Blossom, Jarome Matin, [place]   In 1 month Padgett, Rae Halsted, MD Allergy and [place]   In 2 months Wannetta Sender, Governor Rooks, MD Urogynecology at [facility], [facility]   In 3 months Ladell Pier, MD [place]
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
## 2021-02-11 NOTE — Telephone Encounter (Signed)
Requested medication (s) are due for refill today - yes  Requested medication (s) are on the active medication list -yes  Future visit scheduled -yes  Last refill: -04/26/20 #30 3RF  Notes to clinic: Request RF: fails lab protocol- over 1 year-12/13/19  Requested Prescriptions  Pending Prescriptions Disp Refills   losartan (COZAAR) 100 MG tablet 30 tablet 3    Sig: TAKE 1 TABLET (100 MG TOTAL) BY MOUTH EVERY EVENING.     Cardiovascular:  Angiotensin Receptor Blockers Failed - 02/11/2021 10:23 AM      Failed - Cr in normal range and within 180 days    Creat  Date Value Ref Range Status  12/05/2015 0.78 0.50 - 1.05 mg/dL Final    Comment:      For patients > or = 59 years of age: The upper reference limit for Creatinine is approximately 13% higher for people identified as African-American.      Creatinine, Ser  Date Value Ref Range Status  12/13/2019 0.82 0.57 - 1.00 mg/dL Final   Creatinine, Urine  Date Value Ref Range Status  12/07/2015 74 20 - 320 mg/dL Final          Failed - K in normal range and within 180 days    Potassium  Date Value Ref Range Status  12/13/2019 4.6 3.5 - 5.2 mmol/L Final          Failed - Last BP in normal range    BP Readings from Last 1 Encounters:  02/11/21 140/90          Passed - Patient is not pregnant      Passed - Valid encounter within last 6 months    Recent Outpatient Visits           1 week ago Type 2 diabetes mellitus with morbid obesity ([place])   [place], Neoma Laming B, MD   5 months ago Type 2 diabetes mellitus with morbid obesity ([place])   [place] Karle Plumber B, MD   9 months ago Type 2 diabetes mellitus with morbid obesity [facility])   [place] Ladell Pier, MD   11 months ago Annual physical exam   [place] Ladell Pier, MD   11 months ago Mold suspected exposure    [place], Darrick Penna, MD       Future Appointments             In 1 week Daisy Blossom, Jarome Matin, [place]   In 1 month Padgett, Rae Halsted, MD Allergy and [place]   In 2 months Jaquita Folds, MD Urogynecology at [facility], [facility]   In 3 months Ladell Pier, MD [place]            Signed Prescriptions Disp Refills   omeprazole (PRILOSEC) 20 MG capsule 30 capsule 0    Sig: TAKE 1 CAPSULE (20 MG TOTAL) BY MOUTH DAILY.     Gastroenterology: Proton Pump Inhibitors Passed - 02/11/2021 10:23 AM      Passed - Valid encounter within last 12 months    Recent Outpatient Visits           1 week ago Type 2 diabetes mellitus with morbid obesity [facility])   [place],  MD   5 months ago Type 2 diabetes mellitus with morbid obesity ([place])   [place] Karle Plumber B, MD   9 months ago Type 2 diabetes mellitus with morbid obesity [facility])   [place] Ladell Pier, MD   11 months ago Annual physical exam   [place] Ladell Pier, MD   11 months ago Mold suspected exposure   [place] Swords, Darrick Penna, MD       Future Appointments             In 1 week Daisy Blossom, Jarome Matin, [place]   In 1 month Padgett, Rae Halsted, MD Allergy and [place]   In 2 months Jaquita Folds, MD Urogynecology at [facility], [facility]   In 3 months Ladell Pier, MD [place]               Requested Prescriptions  Pending Prescriptions Disp Refills   losartan (COZAAR) 100 MG tablet 30 tablet 3    Sig: TAKE 1 TABLET (100 MG TOTAL) BY  MOUTH EVERY EVENING.     Cardiovascular:  Angiotensin Receptor Blockers Failed - 02/11/2021 10:23 AM      Failed - Cr in normal range and within 180 days    Creat  Date Value Ref Range Status  12/05/2015 0.78 0.50 - 1.05 mg/dL Final    Comment:      For patients > or = 59 years of age: The upper reference limit for Creatinine is approximately 13% higher for people identified as African-American.      Creatinine, Ser  Date Value Ref Range Status  12/13/2019 0.82 0.57 - 1.00 mg/dL Final   Creatinine, Urine  Date Value Ref Range Status  12/07/2015 74 20 - 320 mg/dL Final          Failed - K in normal range and within 180 days    Potassium  Date Value Ref Range Status  12/13/2019 4.6 3.5 - 5.2 mmol/L Final          Failed - Last BP in normal range    BP Readings from Last 1 Encounters:  02/11/21 140/90          Passed - Patient is not pregnant      Passed - Valid encounter within last 6 months    Recent Outpatient Visits           1 week ago Type 2 diabetes mellitus with morbid obesity ([place])   [place] Karle Plumber B, MD   5 months ago Type 2 diabetes mellitus with morbid obesity [facility])   [place] Karle Plumber B, MD   9 months ago Type 2 diabetes mellitus with morbid obesity [facility])   [place] Ladell Pier, MD   11 months ago Annual physical exam   [place] Ladell Pier, MD   11 months ago Mold suspected exposure   [place] Swords, Darrick Penna, MD       Future Appointments             In 1 week Daisy Blossom, Jarome Matin, [place]   In 1 month [place], [place],  MD Allergy and [place]   In 2 months Wannetta Sender, Governor Rooks, MD Urogynecology at [facility], [facility]   In 3 months Ladell Pier, MD [place]            Signed Prescriptions Disp Refills   omeprazole (PRILOSEC) 20 MG capsule 30 capsule 0    Sig: TAKE 1 CAPSULE (20 MG TOTAL) BY MOUTH DAILY.     Gastroenterology: Proton Pump Inhibitors Passed - 02/11/2021 10:23 AM      Passed - Valid encounter within last 12 months    Recent Outpatient Visits           1 week ago Type 2 diabetes mellitus with morbid obesity ([place])   [place] Karle Plumber B, MD   5 months ago Type 2 diabetes mellitus with morbid obesity [facility])   [place], MD   9 months ago Type 2 diabetes mellitus with morbid obesity [facility])   [place], MD   11 months ago Annual physical exam   [place] Ladell Pier, MD   11 months ago Mold suspected exposure   [place] Swords, Darrick Penna, MD       Future Appointments             In 1 week Daisy Blossom, Jarome Matin, [place]   In 1 month Padgett, Rae Halsted, MD Allergy and [place]   In 2 months Wannetta Sender, Governor Rooks, MD Urogynecology at [facility], [facility]   In 3 months Ladell Pier, MD [place]

## 2021-02-12 ENCOUNTER — Other Ambulatory Visit: Payer: Self-pay

## 2021-02-12 ENCOUNTER — Other Ambulatory Visit: Payer: Self-pay | Admitting: *Deleted

## 2021-02-12 MED ORDER — AZELASTINE HCL 0.1 % NA SOLN: 1.0000 | Freq: Two times a day (BID) | NASAL | 5 refills | Status: DC

## 2021-02-13 ENCOUNTER — Other Ambulatory Visit: Payer: Self-pay

## 2021-02-13 MED ORDER — LOSARTAN POTASSIUM 100 MG PO TABS
ORAL_TABLET | ORAL | 3 refills | Status: DC
  Filled 2021-02-13: qty 30, 30d supply, fill #0
  Filled 2021-04-01: qty 30, 30d supply, fill #1

## 2021-02-17 LAB — CBC WITH DIFFERENTIAL
Basophils Absolute: 0 10*3/uL (ref 0.0–0.2)
Basos: 0 %
EOS (ABSOLUTE): 0.1 10*3/uL (ref 0.0–0.4)
Eos: 1 %
Hematocrit: 40.3 % (ref 34.0–46.6)
Hemoglobin: 13.5 g/dL (ref 11.1–15.9)
Immature Grans (Abs): 0 10*3/uL (ref 0.0–0.1)
Immature Granulocytes: 0 %
Lymphocytes Absolute: 1.9 10*3/uL (ref 0.7–3.1)
Lymphs: 31 %
MCH: 31.6 pg (ref 26.6–33.0)
MCHC: 33.5 g/dL (ref 31.5–35.7)
MCV: 94 fL (ref 79–97)
Monocytes Absolute: 0.6 10*3/uL (ref 0.1–0.9)
Monocytes: 10 %
Neutrophils Absolute: 3.6 10*3/uL (ref 1.4–7.0)
Neutrophils: 58 %
RBC: 4.27 x10E6/uL (ref 3.77–5.28)
RDW: 13.1 % (ref 11.7–15.4)
WBC: 6.2 10*3/uL (ref 3.4–10.8)

## 2021-02-17 LAB — COMPREHENSIVE METABOLIC PANEL
ALT: 20 IU/L (ref 0–32)
AST: 20 IU/L (ref 0–40)
Albumin/Globulin Ratio: 1.3 (ref 1.2–2.2)
Albumin: 4.2 g/dL (ref 3.8–4.9)
Alkaline Phosphatase: 90 IU/L (ref 44–121)
BUN/Creatinine Ratio: 15 (ref 9–23)
BUN: 11 mg/dL (ref 6–24)
Bilirubin Total: <0.2 mg/dL (ref 0.0–1.2)
CO2: 26 mmol/L (ref 20–29)
Calcium: 9.7 mg/dL (ref 8.7–10.2)
Chloride: 101 mmol/L (ref 96–106)
Creatinine, Ser: 0.74 mg/dL (ref 0.57–1.00)
Globulin, Total: 3.3 g/dL (ref 1.5–4.5)
Glucose: 94 mg/dL (ref 70–99)
Potassium: 4.5 mmol/L (ref 3.5–5.2)
Sodium: 141 mmol/L (ref 134–144)
Total Protein: 7.5 g/dL (ref 6.0–8.5)
eGFR: 93 mL/min/{1.73_m2} (ref >59)

## 2021-02-17 LAB — ALLERGEN, TOMATO F25: Allergen Tomato, IgE: 0.72 kU/L — AB

## 2021-02-17 LAB — THYROID CASCADE PROFILE: TSH: 2.04 u[IU]/mL (ref 0.450–4.500)

## 2021-02-17 LAB — ALLERGEN, LEMON, F208: Lemon: 0.37 kU/L — AB

## 2021-02-17 LAB — ALLERGEN, ORANGE F33: Orange: <0.1 kU/L

## 2021-02-17 NOTE — Progress Notes (Signed)
Please let Sabrina [name] know that her blood work to tomato and lemon were elevated. I would recommend avoiding these foods along with other foods that cause itching/ rash and see if this helps. Out an abundance of caution please send in a prescription for Epipen 0.3 mg. Please have her come by the office for a demonstration on how to properly  use and when. Also, please fill out emergency action plan.  [place] was negative.  Her cbc with diff, complete metabolic panel, and thyroid cascade were all normal.

## 2021-02-19 ENCOUNTER — Other Ambulatory Visit: Payer: Self-pay

## 2021-02-19 ENCOUNTER — Ambulatory Visit: Payer: Medicaid Other | Attending: Internal Medicine | Admitting: Pharmacist

## 2021-02-19 DIAGNOSIS — Z23 Encounter for immunization: Secondary | ICD-10-CM | POA: Diagnosis not present

## 2021-02-19 NOTE — Progress Notes (Signed)
Patient presents for vaccination against zoster per orders of Dr. Johnson. Consent given. Counseling provided. No contraindications exists. Vaccine administered without incident.   Luke Van Ausdall, PharmD, BCACP, CPP Clinical Pharmacist [facility] [phone]  

## 2021-02-26 ENCOUNTER — Telehealth: Payer: Self-pay

## 2021-02-26 MED ORDER — EPINEPHRINE 0.3 MG/0.3ML IJ SOAJ: 0.3000 mg | Freq: Once | INTRAMUSCULAR | 2 refills | Status: AC

## 2021-02-26 NOTE — Telephone Encounter (Signed)
Patient returned the phone call from our office and was giving the results. I informed patient that an Epi-pen will be sent into the pharmacy. She will go to the [place] office to get a demonstration of how and when to use the Epi-Pen. I also sent in a script to the [place] on [address].   [place] [phone]

## 2021-02-27 ENCOUNTER — Encounter: Payer: Self-pay | Admitting: Physical Therapy

## 2021-02-27 ENCOUNTER — Other Ambulatory Visit: Payer: Self-pay

## 2021-02-27 ENCOUNTER — Ambulatory Visit: Payer: Medicaid Other | Attending: Obstetrics and Gynecology | Admitting: Physical Therapy

## 2021-02-27 DIAGNOSIS — R279 Unspecified lack of coordination: Secondary | ICD-10-CM | POA: Diagnosis present

## 2021-02-27 DIAGNOSIS — M6281 Muscle weakness (generalized): Secondary | ICD-10-CM | POA: Diagnosis present

## 2021-02-27 DIAGNOSIS — R293 Abnormal posture: Secondary | ICD-10-CM | POA: Insufficient documentation

## 2021-02-27 MED ORDER — EPINEPHRINE 0.3 MG/0.3ML IJ SOAJ: 0.3000 mg | INTRAMUSCULAR | 1 refills | Status: DC | PRN
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
## 2021-02-27 NOTE — Telephone Encounter (Signed)
[facility] [employer]. I talked to the pharmacist to see which version of the epi pen is covered and the pharmacist said the 2 pack of epi pens was never sent in even tho we received a fax on 02/26/21 for them needing a prior authorization to fill the prescription.

## 2021-02-27 NOTE — Telephone Encounter (Signed)
So she has an epipen now?

## 2021-02-27 NOTE — Patient Instructions (Addendum)
Bladder Irritants  Certain foods and beverages can be irritating to the bladder.  Avoiding these irritants may decrease your symptoms of urinary urgency, frequency or bladder pain.  Even reducing your intake can help with your symptoms.  Not everyone is sensitive to all bladder irritants, so you may consider focusing on one irritant at a time, removing or reducing your intake of that irritant for 7-10 days to see if this change helps your symptoms.  Water intake is also very important.  Below is a list of bladder irritants.  Drinks: alcohol, carbonated beverages, caffeinated beverages such as coffee and tea, drinks with artificial sweeteners, citrus juices, apple juice, tomato juice  Foods: tomatoes and tomato based foods, spicy food, sugar and artificial sweeteners, vinegar, chocolate, raw onion, apples, citrus fruits, pineapple, cranberries, tomatoes, strawberries, plums, peaches, cantaloupe  Other: acidic urine (too concentrated) - see water intake info below  Substitutes you can try that are NOT irritating to the bladder: cooked onion, pears, papayas, sun-brewed decaf teas, watermelons, non-citrus herbal teas, apricots, kava and low-acid instant drinks (Postum).    WATER INTAKE: Remember to drink lots of water (aim for fluid intake of half your body weight with 2/3 of fluids being water).  You may be limiting fluids due to fear of leakage, but this can actually worsen urgency symptoms due to highly concentrated urine.  Water helps balance the pH of your urine so it doesn't become too acidic - acidic urine is a bladder irritant!    THE KNACK  The Knack is a strategy you may use to help to reduce or prevent leakage or passing of urine, gas or feces during an activity that causes downward force on the pelvic floor muscles.    Activities that can cause downward pressure on the pelvic floor muscles include coughing, sneezing, laughing, bending, lifting, and transitioning from different body  positions such as from laying down to sitting up and sitting to standing.  To perform The Knack, consciously squeeze and lift your pelvic floor muscles to perform a strong, well-timed pelvic muscle contraction BEFORE AND DURING these activities above.  As your contraction gets more coordinated and your muscles get stronger, you will become more effective in controlling your experience of incontinence or gas passing during these activities.

## 2021-02-28 NOTE — Telephone Encounter (Signed)
Thanks

## 2021-02-28 NOTE — Telephone Encounter (Signed)
I called the patient and let her know I resent in the epi pen on 02/27/21. She tried to pick it up on 02/26/21 and was told a prior authorization was needed. She will call back if she is not able to pick up the epi pen.

## 2021-03-05 ENCOUNTER — Other Ambulatory Visit: Payer: Self-pay | Admitting: Internal Medicine

## 2021-03-05 NOTE — Telephone Encounter (Signed)
Requested medications are due for refill today.  yes  Requested medications are on the active medications list.  yes  Last refill. 12/27/2020  Future visit scheduled.   yes  Notes to clinic.  Failed protocol d/t expired labs.    Requested Prescriptions  Pending Prescriptions Disp Refills   atorvastatin (LIPITOR) 10 MG tablet 30 tablet 0    Sig: TAKE 1 TABLET (10 MG TOTAL) BY MOUTH DAILY.     Cardiovascular:  Antilipid - Statins Failed - 03/05/2021 10:07 PM      Failed - Total Cholesterol in normal range and within 360 days    Cholesterol, Total  Date Value Ref Range Status  11/25/2019 212 (H) 100 - 199 mg/dL Final          Failed - LDL in normal range and within 360 days    LDL Chol Calc (NIH)  Date Value Ref Range Status  11/25/2019 94 0 - 99 mg/dL Final          Failed - HDL in normal range and within 360 days    HDL  Date Value Ref Range Status  11/25/2019 85 >39 mg/dL Final          Failed - Triglycerides in normal range and within 360 days    Triglycerides  Date Value Ref Range Status  11/25/2019 197 (H) 0 - 149 mg/dL Final          Passed - Patient is not pregnant      Passed - Valid encounter within last 12 months    Recent Outpatient Visits           2 weeks ago Need for shingles vaccine   [place], Annie Main L, RPH-CPP   4 weeks ago Type 2 diabetes mellitus with morbid obesity ([place])   [facility] Karle Plumber B, MD   6 months ago Type 2 diabetes mellitus with morbid obesity [facility])   [place] Karle Plumber B, MD   10 months ago Type 2 diabetes mellitus with morbid obesity [facility])   [facility] Ladell Pier, MD   1 year ago Annual physical exam   [place], Deborah B, MD       Future Appointments             In 1 month Padgett, Rae Halsted, MD Allergy and  [place]   In 2 months [place] Sender, Governor Rooks, MD Urogynecology at [facility], [facility]   In 3 months Ladell Pier, MD [place]

## 2021-03-06 ENCOUNTER — Other Ambulatory Visit: Payer: Self-pay

## 2021-03-07 ENCOUNTER — Other Ambulatory Visit: Payer: Self-pay

## 2021-03-07 MED ORDER — ATORVASTATIN CALCIUM 10 MG PO TABS
ORAL_TABLET | Freq: Every day | ORAL | 3 refills | Status: DC
  Filled 2021-03-07: qty 30, 30d supply, fill #0
  Filled 2021-04-29: qty 30, 30d supply, fill #1
  Filled 2021-04-30: qty 90, 90d supply, fill #0

## 2021-03-14 ENCOUNTER — Ambulatory Visit: Payer: Medicaid Other | Attending: Obstetrics and Gynecology | Admitting: Physical Therapy

## 2021-03-14 ENCOUNTER — Encounter: Payer: Self-pay | Admitting: Physical Therapy

## 2021-03-14 ENCOUNTER — Other Ambulatory Visit: Payer: Self-pay

## 2021-03-14 DIAGNOSIS — M6281 Muscle weakness (generalized): Secondary | ICD-10-CM | POA: Diagnosis present

## 2021-03-14 DIAGNOSIS — R279 Unspecified lack of coordination: Secondary | ICD-10-CM | POA: Insufficient documentation

## 2021-03-14 DIAGNOSIS — R293 Abnormal posture: Secondary | ICD-10-CM | POA: Insufficient documentation

## 2021-03-14 NOTE — Patient Instructions (Signed)
Urge Incontinence  Ideal urination frequency is every 2-4 wakeful hours, which equates to 5-8 times within a 24-hour period.   Urge incontinence is leakage that occurs when the bladder muscle contracts, creating a sudden need to go before getting to the bathroom.   Going too often when your bladder isn't actually full can disrupt the body's automatic signals to store and hold urine longer, which will increase urgency/frequency.  In this case, the bladder is running the show and strategies can be learned to retrain this pattern.   One should be able to control the first urge to urinate, at around 114mL.  The bladder can hold up to a grande latte, or 487mL. To help you gain control, practice the Urge Drill below when urgency strikes.  This drill will help retrain your bladder signals and allow you to store and hold urine longer.  The overall goal is to stretch out your time between voids to reach a more manageable voiding schedule.    Practice your "quick flicks" often throughout the day (each waking hour) even when you don't need feel the urge to go.  This will help strengthen your pelvic floor muscles, making them more effective in controlling leakage.  Urge Drill  When you feel an urge to go, follow these steps to regain control: Stop what you are doing and be still Take one deep breath, directing your air into your abdomen Think an affirming thought, such as I've got this. Do 5 quick flicks of your pelvic floor Walk with control to the bathroom to void, or delay voiding   Relaxation Exercises with the Urge to Void   When you experience an urge to void:  FIRST  Stop and stand very still    Sit down if you can    Dont move    You need to stay very still to maintain control  SECOND Squeeze your pelvic floor muscles 5 times, like a quick flick, to keep from leaking  THIRD Relax  Take a deep breath and then let it out  Try to make the urge go away by using relaxation and  visualization techniques  FINALLY When you feel the urge go away somewhat, walk normally to the bathroom.   If the urge gets suddenly stronger on the way, you may stop again and relax to regain control.    THE KNACK  The Knack is a strategy you may use to help to reduce or prevent leakage or passing of urine, gas or feces during an activity that causes downward force on the pelvic floor muscles.    Activities that can cause downward pressure on the pelvic floor muscles include coughing, sneezing, laughing, bending, lifting, and transitioning from different body positions such as from laying down to sitting up and sitting to standing.  To perform The Knack, consciously squeeze and lift your pelvic floor muscles to perform a strong, well-timed pelvic muscle contraction BEFORE AND DURING these activities above.  As your contraction gets more coordinated and your muscles get stronger, you will become more effective in controlling your experience of incontinence or gas passing during these activities.
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
## 2021-03-15 ENCOUNTER — Other Ambulatory Visit: Payer: Self-pay | Admitting: Internal Medicine

## 2021-03-15 ENCOUNTER — Other Ambulatory Visit: Payer: Self-pay

## 2021-03-15 DIAGNOSIS — I10 Essential (primary) hypertension: Secondary | ICD-10-CM

## 2021-03-15 MED ORDER — HYDRALAZINE HCL 50 MG PO TABS
ORAL_TABLET | Freq: Two times a day (BID) | ORAL | 2 refills | Status: DC
  Filled 2021-03-15 – 2021-04-01 (×2): qty 60, 30d supply, fill #0
  Filled 2021-04-29: qty 60, 30d supply, fill #1
  Filled 2021-04-30: qty 120, 60d supply, fill #0

## 2021-03-21 ENCOUNTER — Other Ambulatory Visit: Payer: Self-pay | Admitting: Internal Medicine

## 2021-03-21 ENCOUNTER — Ambulatory Visit: Payer: Medicaid Other | Admitting: Physical Therapy

## 2021-03-21 ENCOUNTER — Ambulatory Visit
Admission: RE | Admit: 2021-03-21 | Discharge: 2021-03-21 | Disposition: A | Discharge status: Home / Self Care | Payer: Medicaid Other | Source: Ambulatory Visit | Attending: Internal Medicine | Admitting: Internal Medicine

## 2021-03-21 DIAGNOSIS — Z9889 Other specified postprocedural states: Secondary | ICD-10-CM

## 2021-03-22 ENCOUNTER — Other Ambulatory Visit: Payer: Self-pay

## 2021-03-27 ENCOUNTER — Other Ambulatory Visit: Payer: Self-pay

## 2021-03-27 ENCOUNTER — Other Ambulatory Visit: Payer: Self-pay | Admitting: Internal Medicine

## 2021-03-27 DIAGNOSIS — K219 Gastro-esophageal reflux disease without esophagitis: Secondary | ICD-10-CM

## 2021-03-27 MED ORDER — OMEPRAZOLE 20 MG PO CPDR
DELAYED_RELEASE_CAPSULE | Freq: Every day | ORAL | 0 refills | Status: DC
  Filled 2021-03-27: qty 30, 30d supply, fill #0

## 2021-03-27 NOTE — Telephone Encounter (Signed)
Requested medication (s) are due for refill today: yes  Requested medication (s) are on the active medication list: yes  Last refill:  02/11/21-02/11/22 #30 0 refills  Future visit scheduled: yes in 2 months  Notes to clinic:  [id number]     Requested Prescriptions  Pending Prescriptions Disp Refills   omeprazole (PRILOSEC) 20 MG capsule 30 capsule 0    Sig: TAKE 1 CAPSULE (20 MG TOTAL) BY MOUTH DAILY.     Gastroenterology: Proton Pump Inhibitors Passed - 03/27/2021  7:27 AM      Passed - Valid encounter within last 12 months    Recent Outpatient Visits           1 month ago Need for shingles vaccine   [place], Jarome Matin, RPH-CPP   1 month ago Type 2 diabetes mellitus with morbid obesity ([place])   [place] Karle Plumber B, MD   7 months ago Type 2 diabetes mellitus with morbid obesity [facility])   [place], MD   11 months ago Type 2 diabetes mellitus with morbid obesity [facility])   [facility] Ladell Pier, MD   1 year ago Annual physical exam   [place], MD       Future Appointments             In 1 week Nelva Bush, Rae Halsted, MD Allergy and [place]   In 1 month Jaquita Folds, MD Urogynecology at [facility], [facility]   In 2 months Ladell Pier, MD [place]

## 2021-04-01 ENCOUNTER — Other Ambulatory Visit: Payer: Self-pay

## 2021-04-02 ENCOUNTER — Other Ambulatory Visit: Payer: Self-pay

## 2021-04-04 ENCOUNTER — Encounter: Payer: Self-pay | Admitting: Allergy

## 2021-04-04 ENCOUNTER — Ambulatory Visit (INDEPENDENT_AMBULATORY_CARE_PROVIDER_SITE_OTHER): Payer: Medicaid Other | Admitting: Allergy

## 2021-04-04 ENCOUNTER — Other Ambulatory Visit: Payer: Self-pay

## 2021-04-04 VITALS — BP 130/74 | HR 92 | Temp 97.2°F | Resp 24 | Ht 62.5 in | Wt 275.0 lb

## 2021-04-04 DIAGNOSIS — H1013 Acute atopic conjunctivitis, bilateral: Secondary | ICD-10-CM | POA: Diagnosis not present

## 2021-04-04 DIAGNOSIS — L299 Pruritus, unspecified: Secondary | ICD-10-CM

## 2021-04-04 DIAGNOSIS — J45991 Cough variant asthma: Secondary | ICD-10-CM | POA: Diagnosis not present

## 2021-04-04 DIAGNOSIS — J3089 Other allergic rhinitis: Secondary | ICD-10-CM | POA: Diagnosis not present

## 2021-04-04 DIAGNOSIS — K219 Gastro-esophageal reflux disease without esophagitis: Secondary | ICD-10-CM

## 2021-04-04 MED ORDER — OMEPRAZOLE 20 MG PO CPDR: DELAYED_RELEASE_CAPSULE | Freq: Every day | ORAL | 5 refills | Status: DC

## 2021-04-04 NOTE — Progress Notes (Signed)
Follow-up Note  RE: Sabrina [name] MRN: [id number] DOB: 06-12-1960 Date of Office Visit: 04/04/2021   History of present illness: Sabrina [name] is a 61 y.o. female presenting today for follow-up of allergic rhinitis with conjunctivitis, cough variant asthma, reflux and adverse food reaction/itching.  Her last visit was a telemedicine visit on 09/23/19 by nurse practitioner Quita Skye.    She states she tried to avoid lemon and tomato products but was an an event where the choices were limited and she ordered shrimp scampi and she thought it was prepared with garlic based cause but it was a lemon based sauce.  She also states she has had ketchup.  She also states she has tea with lemon in it.  She does have access to epipen that's expired.  She was prescribed an epipen but states everytime she went to pharmacy it was always too busy to wait around.    She does report itching when she doesn't take her cetirizine.  The itching can be in roof of mouth, scalp, under breast as places she reports.   She states she may have used the astelin spray just once or so since last visit.    Symbicort she reports using but is still not consistent with everyday use.  However her cough continues and she is on losartan but she does have a concern that maybe it is triggering her cough and I have to agree with her on this.  Review of systems: Review of Systems  Constitutional: Negative.   HENT: Negative.    Eyes: Negative.   Respiratory: Negative.    Cardiovascular: Negative.   Gastrointestinal: Negative.   Musculoskeletal: Negative.   Skin: Negative.   Allergic/Immunologic: Negative.   Neurological: Negative.     All other systems negative unless noted above in HPI  Past medical/social/surgical/family history have been reviewed and are unchanged unless specifically indicated below.  No changes  Medication List: Current Outpatient Medications  Medication Sig Dispense Refill   albuterol  (VENTOLIN HFA) 108 (90 Base) MCG/ACT inhaler Inhale 2 puffs into the lungs every 6 (six) hours as needed for wheezing or shortness of breath. 8.5 g 1   aspirin EC 81 MG tablet Take 1 tablet (81 mg total) by mouth daily. For heart health     atorvastatin (LIPITOR) 10 MG tablet TAKE 1 TABLET (10 MG TOTAL) BY MOUTH DAILY. 30 tablet 3   azelastine (ASTELIN) 0.1 % nasal spray Place 1-2 sprays into both nostrils 2 (two) times daily. 30 mL 5   Blood Glucose Monitoring Suppl (TRUE METRIX METER) w/Device KIT Use as directed 1 kit 0   budesonide-formoterol (SYMBICORT) 160-4.5 MCG/ACT inhaler Inhale 2 puffs into the lungs 2 (two) times daily. 2 puffs twice a day with a spacer. Rinse mouth after use. 10.2 g 5   cetirizine (ZYRTEC) 10 MG tablet Take 1 tablet (10 mg total) by mouth daily. 30 tablet 5   divalproex (DEPAKOTE ER) 250 MG 24 hr tablet Take 3 tablets (750 mg total) by mouth at bedtime. For mood stabilization 90 tablet 0   EPINEPHrine (EPIPEN 2-PAK) 0.3 mg/0.3 mL IJ SOAJ injection Inject 0.3 mg into the muscle as needed for anaphylaxis. 1 each 1   glucose blood (TRUE METRIX BLOOD GLUCOSE TEST) test strip Use as instructed 100 each 12   hydrALAZINE (APRESOLINE) 50 MG tablet TAKE 1 TABLET (50 MG TOTAL) BY MOUTH 2 (TWO) TIMES DAILY. . 60 tablet 2   losartan (COZAAR) 100 MG tablet TAKE 1  TABLET (100 MG TOTAL) BY MOUTH EVERY EVENING. 30 tablet 3   metFORMIN (GLUCOPHAGE) 500 MG tablet TAKE 1 TABLET (500 MG TOTAL) BY MOUTH DAILY WITH BREAKFAST. FOR DIABETES MANANGEMENT 90 tablet 3   omeprazole (PRILOSEC) 20 MG capsule TAKE 1 CAPSULE (20 MG TOTAL) BY MOUTH DAILY. 30 capsule 0   Spacer/Aero-Holding Chambers DEVI Use Spacer with Albuterol inhaler as needed 1 Container 0   TRUEPLUS LANCETS 28G MISC Use as directed 100 each 6   albuterol (PROVENTIL) (2.5 MG/3ML) 0.083% nebulizer solution 1 vial via nebulizer every 4-6 hours as needed for cough/wheeze/shortness of breath/chest tightness. (Patient not taking: Reported  on 04/04/2021) 75 mL 1   Azelastine HCl 0.15 % SOLN Apply 1-2 sprays each nostril twice daily. (Patient not taking: Reported on 04/04/2021) 30 mL 5   fluticasone (FLONASE) 50 MCG/ACT nasal spray Place 2 sprays in each nostril once a day as needed for stuffy nose. 16 g 5   No current facility-administered medications for this visit.     Known medication allergies: Allergies  Allergen Reactions   Benzonatate Other (See Comments)    Made her cough worse   Latex Dermatitis and Other (See Comments)    Hands became scaly   Penicillins Other (See Comments)    Did it involve swelling of the face/tongue/throat, SOB, or low BP? Unk Did it involve sudden or severe rash/hives, skin peeling, or any reaction on the inside of your mouth or nose? Unk Did you need to seek medical attention at a hospital or doctor's office? Unk When did it last happen? "I was little; I don't remember, but my parents told me to never take it."  If all above answers are NO, may proceed with cephalosporin use.     Lisinopril Cough   Oxycodone-Acetaminophen Nausea And Vomiting    Pt thinks she may be able to take with food (??)     Physical examination: Blood pressure 130/74, pulse 92, temperature (!) 97.2 F (36.2 C), temperature source Temporal, resp. rate (!) 24, height 5' 2.5" (1.588 m), weight 275 lb (124.7 kg), SpO2 98 %.  General: Alert, interactive, in no acute distress. HEENT: PERRLA, TMs pearly gray, turbinates mildly edematous without discharge, post-pharynx non erythematous. Neck: Supple without lymphadenopathy. Lungs: Clear to auscultation without wheezing, rhonchi or rales. {no increased work of breathing. CV: Normal S1, S2 without murmurs. Abdomen: Nondistended, nontender. Skin: Warm and dry, without lesions or rashes. Extremities:  No clubbing, cyanosis or edema. Neuro:   Grossly intact.  Diagnositics/Labs: None today  Assessment and plan:   Allergic rhinitis with conjunctivitis - continue  avoidance measures for grass pollen, weed pollen, tree pollen, dust mite, cockroach, cat, and dog.  - Use azelastine (Astelin) 2 sprays each nostril twice daily as needed for runny nose/nasal drainage. This can help with your cough since you are having drainage. - Use saline rinse kit prior to nasal spray use.  Use distilled water or boil water and bring to room temperature (do not use tap water).  - Use fluticasone nasal spray 2 sprays each nostril once a day as needed for stuffy nose  Cough variant asthma - have access to albuterol inhaler 2 puffs via spacer or albuterol 1 vial via nebulizer every 4-6 hours as needed for cough/wheeze/shortness of breath/chest tightness.  May use 15-20 minutes prior to activity.   Monitor frequency of use.   - use pump inhaler with spacer device (plastic tube).   - continue  Symbicort 160/4.5 mcg 2 puffs twice a  day with spacer. Use this every day. Set a reminder on your phone or set it next to your tooth brush as a reminder   Asthma control goals:  Full participation in all desired activities (may need albuterol before activity) Albuterol use two time or less a week on average (not counting use with activity) Cough interfering with sleep two time or less a month Oral steroids no more than once a year No hospitalizations  -concerned that continued dry cough could be due to Losartan use which is an ARB type medication and this group of medications can cause cough.  Would discuss with Dr. Wynetta Emery regarding alterative to Losartan  GERD - can use omeprazole daily for reflux symptom control  Itching - Continue to avoid foods that cause itching - Blood work to tomato and lemon were elevated. Recommend avoiding these foods along with other foods that cause itching/ rash and see if this helps. Have access to Epipen 0.3 mg in case of allergic reaction - Testing to orange was negative. - Cbc with diff, complete metabolic panel, and thyroid cascade were all  normal.  Follow-up in 4 months or sooner if needed  I appreciate the opportunity to take part in Sabrina [name]'s care. Please do not hesitate to contact me with questions.  Sincerely,   Prudy Feeler, MD Allergy/Immunology [facility] [place] [facility] [place]

## 2021-04-04 NOTE — Patient Instructions (Addendum)
Allergic rhinitis with conjunctivitis °- continue avoidance measures for grass pollen, weed pollen, tree pollen, dust mite, cockroach, cat, and dog.  °- Use azelastine (Astelin) 2 sprays each nostril twice daily as needed for runny nose/nasal drainage. This can help with your cough since you are having drainage. °- Use saline rinse kit prior to nasal spray use.  Use distilled water or boil water and bring to room temperature (do not use tap water).  °- Use fluticasone nasal spray 2 sprays each nostril once a day as needed for stuffy nose ° °Cough variant asthma °- have access to albuterol inhaler 2 puffs via spacer or albuterol 1 vial via nebulizer every 4-6 hours as needed for cough/wheeze/shortness of breath/chest tightness.  May use 15-20 minutes prior to activity.   Monitor frequency of use.   °- use pump inhaler with spacer device (plastic tube).   °- continue  Symbicort 160/4.5 mcg 2 puffs twice a day with spacer. Use this every day. Set a reminder on your phone or set it next to your tooth brush as a reminder   °Asthma control goals:  °Full participation in all desired activities (may need albuterol before activity) °Albuterol use two time or less a week on average (not counting use with activity) °Cough interfering with sleep two time or less a month °Oral steroids no more than once a year °No hospitalizations ° °-concerned that continued dry cough could be due to Losartan use which is an ARB type medication and this group of medications can cause cough.  Would discuss with Dr. Johnson regarding alterative to Losartan ° °GERD °- can use omeprazole daily for reflux symptom control ° °Itching °- Continue to avoid foods that cause itching °- Blood work to tomato and lemon were elevated. Recommend avoiding these foods along with other foods that cause itching/ rash and see if this helps. Have access to Epipen 0.3 mg in case of allergic reaction °- Testing to orange was negative. °- Cbc with diff, complete  metabolic panel, and thyroid cascade were all normal. ° °Follow-up in 4 months or sooner if needed ° °

## 2021-04-08 LAB — HM DIABETES EYE EXAM

## 2021-04-12 ENCOUNTER — Other Ambulatory Visit: Payer: Self-pay

## 2021-04-12 MED FILL — Metformin HCl Tab 500 MG: ORAL | 90 days supply | Qty: 90 | Fill #0 | Status: AC

## 2021-04-24 ENCOUNTER — Encounter (HOSPITAL_COMMUNITY): Payer: Self-pay | Admitting: Emergency Medicine

## 2021-04-24 ENCOUNTER — Other Ambulatory Visit: Payer: Self-pay

## 2021-04-24 ENCOUNTER — Ambulatory Visit (HOSPITAL_COMMUNITY)
Admission: EM | Admit: 2021-04-24 | Discharge: 2021-04-24 | Disposition: A | Discharge status: Home / Self Care | Payer: Medicaid Other | Attending: Urgent Care | Admitting: Urgent Care

## 2021-04-24 DIAGNOSIS — Z20822 Contact with and (suspected) exposure to covid-19: Secondary | ICD-10-CM | POA: Insufficient documentation

## 2021-04-24 DIAGNOSIS — J069 Acute upper respiratory infection, unspecified: Secondary | ICD-10-CM

## 2021-04-24 DIAGNOSIS — R519 Headache, unspecified: Secondary | ICD-10-CM | POA: Diagnosis present

## 2021-04-24 DIAGNOSIS — U071 COVID-19: Secondary | ICD-10-CM | POA: Diagnosis not present

## 2021-04-24 DIAGNOSIS — R053 Chronic cough: Secondary | ICD-10-CM | POA: Diagnosis not present

## 2021-04-24 DIAGNOSIS — J42 Unspecified chronic bronchitis: Secondary | ICD-10-CM | POA: Insufficient documentation

## 2021-04-24 LAB — POC INFLUENZA A AND B ANTIGEN (URGENT CARE ONLY)
INFLUENZA A ANTIGEN, POC: NEGATIVE
INFLUENZA B ANTIGEN, POC: NEGATIVE

## 2021-04-24 NOTE — Discharge Instructions (Signed)
Your flu test is negative. We will contact you within 24 hours regarding your covid test results. Please stay hydrated with water. You may alternate tylenol and ibuprofen for pain control and fever reduction. There is no evidence of a bacterial infection on exam. Please avoid close contact with others until results obtained.
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
## 2021-04-24 NOTE — ED Triage Notes (Signed)
Headache cough, aching, congestion, head stuffiness.  Symptoms started yesterday

## 2021-04-25 LAB — SARS CORONAVIRUS 2 (TAT 6-24 HRS): SARS Coronavirus 2: POSITIVE — AB

## 2021-04-29 ENCOUNTER — Encounter: Payer: Self-pay | Admitting: Internal Medicine

## 2021-04-29 ENCOUNTER — Ambulatory Visit (HOSPITAL_COMMUNITY)
Admission: EM | Admit: 2021-04-29 | Discharge: 2021-04-29 | Discharge status: Against Medical Advice | Payer: Medicaid Other

## 2021-04-29 ENCOUNTER — Other Ambulatory Visit: Payer: Self-pay

## 2021-04-30 ENCOUNTER — Other Ambulatory Visit: Payer: Self-pay

## 2021-05-01 ENCOUNTER — Other Ambulatory Visit: Payer: Self-pay | Admitting: Allergy

## 2021-05-01 ENCOUNTER — Other Ambulatory Visit: Payer: Self-pay

## 2021-05-01 DIAGNOSIS — J45991 Cough variant asthma: Secondary | ICD-10-CM

## 2021-05-01 MED ORDER — CETIRIZINE HCL 10 MG PO TABS
10.0000 mg | ORAL_TABLET | Freq: Every day | ORAL | 5 refills | Status: DC
  Filled 2021-05-01: qty 30, 30d supply, fill #0
  Filled 2021-06-05: qty 30, 30d supply, fill #1
  Filled 2021-07-09: qty 30, 30d supply, fill #0
  Filled 2021-07-09: qty 30, 30d supply, fill #2

## 2021-05-02 ENCOUNTER — Other Ambulatory Visit: Payer: Self-pay

## 2021-05-05 ENCOUNTER — Other Ambulatory Visit: Payer: Self-pay | Admitting: Internal Medicine

## 2021-05-06 ENCOUNTER — Other Ambulatory Visit: Payer: Self-pay

## 2021-05-06 MED ORDER — ATORVASTATIN CALCIUM 10 MG PO TABS
ORAL_TABLET | Freq: Every day | ORAL | 3 refills | Status: DC
  Filled 2021-05-06: qty 30, fill #0
  Filled 2021-10-10 – 2021-10-18 (×2): qty 30, 30d supply, fill #0
  Filled 2021-11-28: qty 30, 30d supply, fill #1
  Filled 2021-11-29: qty 30, 30d supply, fill #0
  Filled 2021-12-31: qty 30, 30d supply, fill #1
  Filled 2022-02-11: qty 30, 30d supply, fill #2

## 2021-05-10 ENCOUNTER — Ambulatory Visit: Payer: Medicaid Other | Admitting: Obstetrics and Gynecology

## 2021-05-13 ENCOUNTER — Ambulatory Visit (INDEPENDENT_AMBULATORY_CARE_PROVIDER_SITE_OTHER): Payer: Medicaid Other

## 2021-05-13 ENCOUNTER — Other Ambulatory Visit: Payer: Self-pay

## 2021-05-13 ENCOUNTER — Ambulatory Visit (HOSPITAL_COMMUNITY)
Admission: EM | Admit: 2021-05-13 | Discharge: 2021-05-13 | Disposition: A | Discharge status: Home / Self Care | Payer: Medicaid Other | Attending: Emergency Medicine | Admitting: Emergency Medicine

## 2021-05-13 ENCOUNTER — Encounter (HOSPITAL_COMMUNITY): Payer: Self-pay

## 2021-05-13 DIAGNOSIS — U071 COVID-19: Secondary | ICD-10-CM | POA: Diagnosis not present

## 2021-05-13 MED ORDER — METHYLPREDNISOLONE 4 MG PO TBPK: ORAL_TABLET | ORAL | 0 refills | Status: DC

## 2021-05-13 MED ORDER — LEVOFLOXACIN 500 MG PO TABS: 500.0000 mg | ORAL_TABLET | Freq: Every day | ORAL | 0 refills | Status: DC

## 2021-05-13 NOTE — ED Triage Notes (Signed)
Pt presents with complaints of continued chest congestion and cough. Reports having covid and testing positive on 1/25. Pt reports cough is getting worse.

## 2021-05-13 NOTE — Discharge Instructions (Addendum)
Your chest x-ray does not show any concern for advanced pneumonia however when I listened to your lungs, you had decreased air movement at your lung bases on both sides which is concerning for early signs of pneumonia.  I recommend that you begin taking an antibiotic for coverage of possible pneumonia as well as a steroid Dosepak to help improve your work of breathing and reduce your cough.  Please be sure that you are using Flonase, Astelin and Zyrtec at this time.  Please also be sure that you are using albuterol and Symbicort as prescribed at this time.  I have provided you with a note stating that you were diagnosed with COVID-19 18 days ago, the CDC does not recommend test for cure (negative COVID-19 test to prove that you are no longer contagious) and that I have advised you that you are no longer contagious with COVID-19.  Thank you for visiting urgent care today.  If you have not had improvement of your symptoms in the next 3 to 5 days, please return for repeat evaluation.
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
## 2021-05-25 ENCOUNTER — Encounter: Payer: Self-pay | Admitting: Internal Medicine

## 2021-05-28 ENCOUNTER — Other Ambulatory Visit: Payer: Self-pay

## 2021-05-29 ENCOUNTER — Ambulatory Visit: Payer: Medicaid Other | Admitting: Obstetrics and Gynecology
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
## 2021-05-30 ENCOUNTER — Encounter: Payer: Self-pay | Admitting: Obstetrics and Gynecology

## 2021-05-30 ENCOUNTER — Ambulatory Visit (INDEPENDENT_AMBULATORY_CARE_PROVIDER_SITE_OTHER): Payer: Medicaid Other | Admitting: Obstetrics and Gynecology

## 2021-05-30 ENCOUNTER — Other Ambulatory Visit: Payer: Self-pay

## 2021-05-30 VITALS — BP 165/92 | HR 71

## 2021-05-30 DIAGNOSIS — N3281 Overactive bladder: Secondary | ICD-10-CM | POA: Diagnosis not present

## 2021-05-30 MED ORDER — SOLIFENACIN SUCCINATE 5 MG PO TABS: 5.0000 mg | ORAL_TABLET | Freq: Every day | ORAL | 5 refills | Status: DC

## 2021-05-31 ENCOUNTER — Other Ambulatory Visit: Payer: Self-pay

## 2021-06-03 ENCOUNTER — Encounter (HOSPITAL_COMMUNITY): Payer: Self-pay

## 2021-06-03 ENCOUNTER — Other Ambulatory Visit: Payer: Self-pay

## 2021-06-03 ENCOUNTER — Ambulatory Visit (HOSPITAL_COMMUNITY)
Admission: EM | Admit: 2021-06-03 | Discharge: 2021-06-03 | Disposition: A | Discharge status: Home / Self Care | Payer: Medicaid Other | Attending: Internal Medicine | Admitting: Internal Medicine

## 2021-06-03 DIAGNOSIS — Z20822 Contact with and (suspected) exposure to covid-19: Secondary | ICD-10-CM | POA: Insufficient documentation

## 2021-06-03 DIAGNOSIS — R6889 Other general symptoms and signs: Secondary | ICD-10-CM | POA: Insufficient documentation

## 2021-06-03 DIAGNOSIS — J069 Acute upper respiratory infection, unspecified: Secondary | ICD-10-CM

## 2021-06-03 LAB — POC INFLUENZA A AND B ANTIGEN (URGENT CARE ONLY)
INFLUENZA A ANTIGEN, POC: NEGATIVE
INFLUENZA B ANTIGEN, POC: NEGATIVE

## 2021-06-03 LAB — POCT RAPID STREP A, ED / UC: Streptococcus, Group A Screen (Direct): NEGATIVE

## 2021-06-03 LAB — SARS CORONAVIRUS 2 (TAT 6-24 HRS): SARS Coronavirus 2: NEGATIVE

## 2021-06-03 MED ORDER — FLUTICASONE PROPIONATE 50 MCG/ACT NA SUSP: 2.0000 | Freq: Every day | NASAL | 0 refills | Status: DC
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
## 2021-06-03 NOTE — Discharge Instructions (Addendum)
Your strep, Flu A&B test are negative. You seem to have a virus and antibiotics do not help this.  ?We will call you if the covid test and throat culture comes back positive ?In the mean time you may take Tylenol as needed for aches ?Use the Flonase spray to help with post nasal drainage ?You may also do saline nose rinses twice a day to clean out mucous, but do not do it at bed time. You may do this for 5-7 days ? ?

## 2021-06-03 NOTE — ED Triage Notes (Signed)
Pt presents with c/o scratchy throat, body aches and headaches x 1 day.  ? ? ?

## 2021-06-04 ENCOUNTER — Ambulatory Visit: Payer: Medicaid Other | Attending: Internal Medicine | Admitting: Internal Medicine

## 2021-06-04 ENCOUNTER — Encounter: Payer: Self-pay | Admitting: Internal Medicine

## 2021-06-04 DIAGNOSIS — Z5986 Financial insecurity: Secondary | ICD-10-CM | POA: Diagnosis not present

## 2021-06-04 DIAGNOSIS — Z6841 Body Mass Index (BMI) 40.0 and over, adult: Secondary | ICD-10-CM | POA: Insufficient documentation

## 2021-06-04 DIAGNOSIS — L84 Corns and callosities: Secondary | ICD-10-CM

## 2021-06-04 DIAGNOSIS — E1142 Type 2 diabetes mellitus with diabetic polyneuropathy: Secondary | ICD-10-CM | POA: Diagnosis not present

## 2021-06-04 DIAGNOSIS — E1169 Type 2 diabetes mellitus with other specified complication: Secondary | ICD-10-CM

## 2021-06-04 DIAGNOSIS — I10 Essential (primary) hypertension: Secondary | ICD-10-CM | POA: Diagnosis not present

## 2021-06-04 DIAGNOSIS — E1159 Type 2 diabetes mellitus with other circulatory complications: Secondary | ICD-10-CM | POA: Insufficient documentation

## 2021-06-04 DIAGNOSIS — Z7984 Long term (current) use of oral hypoglycemic drugs: Secondary | ICD-10-CM | POA: Insufficient documentation

## 2021-06-04 DIAGNOSIS — M1711 Unilateral primary osteoarthritis, right knee: Secondary | ICD-10-CM | POA: Insufficient documentation

## 2021-06-04 DIAGNOSIS — Z79899 Other long term (current) drug therapy: Secondary | ICD-10-CM | POA: Diagnosis not present

## 2021-06-04 DIAGNOSIS — Z8616 Personal history of COVID-19: Secondary | ICD-10-CM | POA: Diagnosis not present

## 2021-06-04 DIAGNOSIS — F319 Bipolar disorder, unspecified: Secondary | ICD-10-CM | POA: Diagnosis not present

## 2021-06-04 DIAGNOSIS — R053 Chronic cough: Secondary | ICD-10-CM | POA: Diagnosis not present

## 2021-06-04 DIAGNOSIS — J069 Acute upper respiratory infection, unspecified: Secondary | ICD-10-CM | POA: Diagnosis not present

## 2021-06-04 DIAGNOSIS — E782 Mixed hyperlipidemia: Secondary | ICD-10-CM

## 2021-06-04 DIAGNOSIS — J45909 Unspecified asthma, uncomplicated: Secondary | ICD-10-CM | POA: Diagnosis not present

## 2021-06-04 DIAGNOSIS — Z59869 Financial insecurity, unspecified: Secondary | ICD-10-CM

## 2021-06-04 LAB — POCT GLYCOSYLATED HEMOGLOBIN (HGB A1C): Hemoglobin A1C: 5.8 % — AB (ref 4.0–5.6)

## 2021-06-04 NOTE — Patient Instructions (Signed)
Stop with Losartan since you think it is causing cough. ?Increase hydralazine to 50 mg 3 times a day. ? ?When you are ready to reconsider having weight reduction surgery please let me know. ? ?I will have our social worker touch base with you to see if any assistance can be rendered regarding search for affordable housing. ? ?Diabetes Mellitus and Exercise ?Exercising regularly is important for overall health, especially for people who have diabetes mellitus. Exercising is not only about losing weight. It has many other health benefits, such as increasing muscle strength and bone density and reducing body fat and stress. This leads to improved fitness, flexibility, and endurance, all of which result in better overall health. ?What are the benefits of exercise if I have diabetes? ?Exercise has many benefits for people with diabetes. They include: ?Helping to lower and control blood sugar (glucose). ?Helping the body to respond better to the hormone insulin by improving insulin sensitivity. ?Reducing how much insulin the body needs. ?Lowering the risk for heart disease by: ?Lowering "bad" cholesterol and triglyceride levels. ?Increasing "good" cholesterol levels. ?Lowering blood pressure. ?Lowering blood glucose levels. ?What is my activity plan? ?Your health care provider or certified diabetes educator can help you make a plan for the type and frequency of exercise that works for you. This is called your activity plan. Be sure to: ?Get at least 150 minutes of medium-intensity or high-intensity exercise each week. Exercises may include brisk walking, biking, or water aerobics. ?Do stretching and strengthening exercises, such as yoga or weight lifting, at least 2 times a week. ?Spread out your activity over at least 3 days of the week. ?Get some form of physical activity each day. ?Do not go more than 2 days in a row without some kind of physical activity. ?Avoid being inactive for more than 90 minutes at a time. Take  frequent breaks to walk or stretch. ?Choose exercises or activities that you enjoy. Set realistic goals. ?Start slowly and gradually increase your exercise intensity over time. ?How do I manage my diabetes during exercise? ?Monitor your blood glucose ?Check your blood glucose before and after exercising. If your blood glucose is: ?240 mg/dL (13.3 mmol/L) or higher before you exercise, check your urine for ketones. These are chemicals created by the liver. If you have ketones in your urine, do not exercise until your blood glucose returns to normal. ?100 mg/dL (5.6 mmol/L) or lower, eat a snack containing 15-20 grams of carbohydrate. Check your blood glucose 15 minutes after the snack to make sure that your glucose level is above 100 mg/dL (5.6 mmol/L) before you start your exercise. ?Know the symptoms of low blood glucose (hypoglycemia) and how to treat it. Your risk for hypoglycemia increases during and after exercise. ?Follow these tips and your health care provider's instructions ?Keep a carbohydrate snack that is fast-acting for use before, during, and after exercise to help prevent or treat hypoglycemia. ?Avoid injecting insulin into areas of the body that are going to be exercised. For example, avoid injecting insulin into: ?Your arms, when you are about to play tennis. ?Your legs, when you are about to go jogging. ?Keep records of your exercise habits. Doing this can help you and your health care provider adjust your diabetes management plan as needed. Write down: ?Food that you eat before and after you exercise. ?Blood glucose levels before and after you exercise. ?The type and amount of exercise you have done. ?Work with your health care provider when you start a new  exercise or activity. He or she may need to: ?Make sure that the activity is safe for you. ?Adjust your insulin, other medicines, and food that you eat. ?Drink plenty of water while you exercise. This prevents loss of water (dehydration) and  problems caused by a lot of heat in the body (heat stroke). ?Where to find more information ?American Diabetes Association: www.diabetes.org ?Summary ?Exercising regularly is important for overall health, especially for people who have diabetes mellitus. ?Exercising has many health benefits. It increases muscle strength and bone density and reduces body fat and stress. It also lowers and controls blood glucose. ?Your health care provider or certified diabetes educator can help you make an activity plan for the type and frequency of exercise that works for you. ?Work with your health care provider to make sure any new activity is safe for you. Also work with your health care provider to adjust your insulin, other medicines, and the food you eat. ?This information is not intended to replace advice given to you by your health care provider. Make sure you discuss any questions you have with your health care provider. ?Document Revised: 12/13/2018 Document Reviewed: 12/13/2018 ?Elsevier Patient Education ? [place]. ? ?
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
## 2021-06-06 ENCOUNTER — Other Ambulatory Visit: Payer: Self-pay

## 2021-06-21 ENCOUNTER — Telehealth: Payer: Self-pay | Admitting: Internal Medicine

## 2021-07-02 ENCOUNTER — Encounter: Payer: Self-pay | Admitting: Pharmacist

## 2021-07-02 ENCOUNTER — Ambulatory Visit: Payer: Medicaid Other | Attending: Internal Medicine | Admitting: Pharmacist

## 2021-07-02 VITALS — BP 153/88 | HR 72

## 2021-07-02 DIAGNOSIS — M069 Rheumatoid arthritis, unspecified: Secondary | ICD-10-CM | POA: Insufficient documentation

## 2021-07-02 DIAGNOSIS — F319 Bipolar disorder, unspecified: Secondary | ICD-10-CM | POA: Diagnosis not present

## 2021-07-02 DIAGNOSIS — J3081 Allergic rhinitis due to animal (cat) (dog) hair and dander: Secondary | ICD-10-CM | POA: Insufficient documentation

## 2021-07-02 DIAGNOSIS — Z79899 Other long term (current) drug therapy: Secondary | ICD-10-CM | POA: Diagnosis not present

## 2021-07-02 DIAGNOSIS — J45909 Unspecified asthma, uncomplicated: Secondary | ICD-10-CM | POA: Insufficient documentation

## 2021-07-02 DIAGNOSIS — E118 Type 2 diabetes mellitus with unspecified complications: Secondary | ICD-10-CM | POA: Insufficient documentation

## 2021-07-02 DIAGNOSIS — M159 Polyosteoarthritis, unspecified: Secondary | ICD-10-CM | POA: Diagnosis not present

## 2021-07-02 DIAGNOSIS — I10 Essential (primary) hypertension: Secondary | ICD-10-CM | POA: Insufficient documentation

## 2021-07-02 DIAGNOSIS — E785 Hyperlipidemia, unspecified: Secondary | ICD-10-CM | POA: Diagnosis not present

## 2021-07-02 MED ORDER — HYDROCHLOROTHIAZIDE 25 MG PO TABS: 25.0000 mg | ORAL_TABLET | Freq: Every day | ORAL | 0 refills | Status: DC

## 2021-07-02 NOTE — Progress Notes (Signed)
? ?S:    ?PCP: Dr. Wynetta Emery  ? ?No chief complaint on file. ? ?Sabrina [name] is a 61 y.o. female who presents for hypertension evaluation, education, and management. PMH is significant for HTN, HL,, DM,hx of + RA factor, Bipolar Disorder (followed at [facility]), asthma, chronic cough (dx with cough variant asthma and allergic rhinittis by Dr. 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 Patient was referred and last seen by Primary Care Provider, Dr. Wynetta Emery, on 06/04/2021. BP was okay at that visit, however, we had to stop losartan d/t cough. Hydralazine was increased to 50 mg TID.  ? ?Today, patient arrives in good spirits and presents without assistance. Denies dizziness, headache, blurred vision, swelling.  ? ?Patient reports hypertension is longstanding.  ? ?Family/Social history:  ?Fhx: HTN, DM, HLD, heart disease/MI  ?Tobacco: never  ?Alcohol: none ? ?Medication adherence reported. Patient has taken BP medications today.  ? ?Current antihypertensives include:  ?-Hydralazine 50 mg TID ("still getting used to it") ? ?Reported home BP readings:  ?-None  ? ?Patient reported dietary habits:  ?-Sodium: does not add salt to her food. Does not cook with much salt.  ?-Caffeine: does drink coffee but drinks decaf  ? ?Patient-reported exercise habits: none.  ? ?O:  ?Vitals:  ? 07/02/21 0920  ?BP: (!) 153/88  ?Pulse: 72  ? ? ?Last 3 Office BP readings: ?BP Readings from Last 3 Encounters:  ?07/02/21 (!) 153/88  ?06/04/21 140/82  ?06/03/21 139/73  ? ?BMET ?   ?Component Value Date/Time  ? NA 141 02/11/2021 0922  ? K 4.5 02/11/2021 0922  ? CL 101 02/11/2021 0922  ? CO2 26 02/11/2021 0922  ? GLUCOSE 94 02/11/2021 0922  ? GLUCOSE 117 (H) 10/03/2019 0523  ? BUN 11 02/11/2021 0922  ? CREATININE 0.74 02/11/2021 0922  ? CREATININE 0.78 12/05/2015 0848  ? CALCIUM 9.7 02/11/2021 0922  ? GFRNONAA 80 12/13/2019 1232  ? GFRNONAA 67 10/11/2015 1222  ? GFRAA 92 12/13/2019 1232  ? GFRAA 77  10/11/2015 1222  ? ? ?Renal function: ?CrCl cannot be calculated (Patient's most recent lab result is older than the maximum 21 days allowed.). ? ?Clinical ASCVD: No  ?The 10-year ASCVD risk score (Arnett DK, et al., 2019) is: 19.4% ?  Values used to calculate the score: ?    Age: 9 years ?    Sex: Female ?    Is Non-Hispanic African American: Yes ?    Diabetic: Yes ?    Tobacco smoker: No ?    Systolic Blood Pressure: 443 mmHg ?    Is BP treated: Yes ?    HDL Cholesterol: 85 mg/dL ?    Total Cholesterol: 212 mg/dL ? ?A/P: ?Hypertension longstanding currently uncontrolled on current medications. BP goal < 130/80 mmHg. Medication adherence appears appropriate.  ?-Started HCTZ. Pt has taken before. No hx of gout and no recent AKI or electrolyte abnormalities. Additionally, this could aid in decreasing some LE edema she has.  ?-Continue hydralazine for now.  ?-Patient educated on purpose, proper use, and potential adverse effects of HCTZ.  ?-F/u labs ordered - anticipate BMP in 1 month. ?-Counseled on lifestyle modifications for blood pressure control including reduced dietary sodium, increased exercise, adequate sleep. ?-Encouraged patient to check BP at home and bring log of readings to next visit. Counseled on proper use of home BP cuff.  ? ?Results reviewed and written information provided. Patient verbalized understanding of treatment plan. Total time in  face-to-face counseling 30 minutes.  ? ?F/u clinic visit in 1 month. ? ?Benard Halsted, PharmD, BCACP, CPP ?Clinical Pharmacist ?[facility] ?[phone] ? ? ?

## 2021-07-09 ENCOUNTER — Other Ambulatory Visit: Payer: Self-pay

## 2021-07-09 ENCOUNTER — Other Ambulatory Visit (HOSPITAL_COMMUNITY): Payer: Self-pay

## 2021-07-12 ENCOUNTER — Telehealth: Payer: Self-pay | Admitting: Internal Medicine

## 2021-07-12 NOTE — Telephone Encounter (Signed)
Pt states she met w/ a Education officer, museum w/ her last visit w/ Dr Wynetta Emery and was promised a call back to discuss being evicted. ?Pt states she got a letter yesterday.  Pt states she doesn't have time to wait around, she needs someone that is going to help her. ?

## 2021-07-12 NOTE — Telephone Encounter (Signed)
I previously spoke with this pt and on 3/24. I did not speak to her at her visit with her PCP. ON 3/24 I referred her to legal aid however she stated she has not heard anything. LCSW will send a message to legal aid rep for them to contact pt. Pt stated that she has a received a final notice however she still has not received a court ordered eviction notice but believes she will soon. Pt is aware of assistance with DSS but pt needs a court ordered eviction notice in order to receive assistance. ?

## 2021-07-22 DIAGNOSIS — G5600 Carpal tunnel syndrome, unspecified upper limb: Secondary | ICD-10-CM

## 2021-07-22 HISTORY — DX: Carpal tunnel syndrome, unspecified upper limb: G56.00

## 2021-07-23 ENCOUNTER — Encounter: Payer: Self-pay | Admitting: Physician Assistant

## 2021-07-23 ENCOUNTER — Ambulatory Visit: Payer: Self-pay

## 2021-07-23 ENCOUNTER — Ambulatory Visit (INDEPENDENT_AMBULATORY_CARE_PROVIDER_SITE_OTHER): Payer: Medicaid Other | Admitting: Physician Assistant

## 2021-07-23 VITALS — BP 133/85 | HR 78 | Temp 98.2°F | Resp 18 | Ht 64.0 in | Wt 268.0 lb

## 2021-07-23 DIAGNOSIS — Z6841 Body Mass Index (BMI) 40.0 and over, adult: Secondary | ICD-10-CM | POA: Diagnosis not present

## 2021-07-23 DIAGNOSIS — G5603 Carpal tunnel syndrome, bilateral upper limbs: Secondary | ICD-10-CM

## 2021-07-23 DIAGNOSIS — Z5901 Sheltered homelessness: Secondary | ICD-10-CM | POA: Diagnosis not present

## 2021-07-23 MED ORDER — IBUPROFEN 600 MG PO TABS: 600.0000 mg | ORAL_TABLET | Freq: Three times a day (TID) | ORAL | 0 refills | Status: DC | PRN

## 2021-07-23 NOTE — Telephone Encounter (Signed)
? ?  Chief Complaint: Left wrist pain that radiates up arm into shoulder ?Symptoms: Above ?Frequency: 2 weeks ago ?Pertinent Negatives: Patient denies weakness ?Disposition: '[]'$ ED /'[]'$ Urgent Care (no appt availability in office) / '[x]'$ Appointment(In office/virtual)/ '[]'$  [facility]/ '[]'$ Home Care/ '[]'$ Refused Recommended Disposition /'[]'$ [facility]/ '[]'$  Follow-up with PCP ?Additional Notes: Will call back if she decides to go to UC.  ? ?Reason for Disposition ? [1] MODERATE pain (e.g., interferes with normal activities) AND [2] present > 3 days ? ?Answer Assessment - Initial Assessment Questions ?1. ONSET: "When did the pain start?" ?    2 weeks ago ?2. LOCATION: "Where is the pain located?" ?    Left wrist arm and shoulder ?3. PAIN: "How bad is the pain?" (Scale 1-10; or mild, moderate, severe) ?  - MILD (1-3): doesn't interfere with normal activities ?  - MODERATE (4-7): interferes with normal activities (e.g., work or school) or awakens from sleep ?  - SEVERE (8-10): excruciating pain, unable to do any normal activities, unable to hold a cup of water ?    Now - 10 ?4. WORK OR EXERCISE: "Has there been any recent work or exercise that involved this part of the body?" ?    No ?5. CAUSE: "What do you think is causing the arm pain?" ?    Unsure ?6. OTHER SYMPTOMS: "Do you have any other symptoms?" (e.g., neck pain, swelling, rash, fever, numbness, weakness) ?    Up to shoulder ?7. PREGNANCY: "Is there any chance you are pregnant?" "When was your last menstrual period?" ?    No ? ?Protocols used: Arm Pain-A-AH ? ?

## 2021-07-23 NOTE — Progress Notes (Signed)
Patient has taken medication today around 9 am. ?Patient has eaten today. ?Patient reports pain in the L wrist intermittently with increase in the am and over the past 2 weeks. ?Patient has not taken hydralazine in 2 weeks. Patient should be taking both medications per 07/02/21 visit with clinical pharmacist Lurena Joiner. ?
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
## 2021-07-23 NOTE — Patient Instructions (Signed)
I encourage you to use ibuprofen 600 mg every 8 hours until your swelling and pain has resolved.  You can also use ice.  I strongly encourage you to wear bracing as we discussed. ? ?Please let us know everything else we can do for you ? ?Sabrina Rad, PA-C ?Physician Assistant ?[facility] ?[email or web address] ? ? ?Carpal Tunnel Syndrome ? ?Carpal tunnel syndrome is a condition that causes pain, numbness, and weakness in your hand and fingers. The carpal tunnel is a narrow area located on the palm side of your wrist. Repeated wrist motion or certain diseases may cause swelling within the tunnel. This swelling pinches the main nerve in the wrist. The main nerve in the wrist is called the median nerve. ?What are the causes? ?This condition may be caused by: ?Repeated and forceful wrist and hand motions. ?Wrist injuries. ?Arthritis. ?A cyst or tumor in the carpal tunnel. ?Fluid buildup during pregnancy. ?Use of tools that vibrate. ?Sometimes the cause of this condition is not known. ?What increases the risk? ?The following factors may make you more likely to develop this condition: ?Having a job that requires you to repeatedly or forcefully move your wrist or hand or requires you to use tools that vibrate. This may include jobs that involve using computers, working on an [employer], or working with [place] such as Pension scheme manager. ?Being a woman. ?Having certain conditions, such as: ?Diabetes. ?Obesity. ?An underactive thyroid (hypothyroidism). ?Kidney failure. ?Rheumatoid arthritis. ?What are the signs or symptoms? ?Symptoms of this condition include: ?A tingling feeling in your fingers, especially in your thumb, index, and middle fingers. ?Tingling or numbness in your hand. ?An aching feeling in your entire arm, especially when your wrist and elbow are bent for a long time. ?Wrist pain that goes up your arm to your shoulder. ?Pain that goes down into  your palm or fingers. ?A weak feeling in your hands. You may have trouble grabbing and holding items. ?Your symptoms may feel worse during the night. ?How is this diagnosed? ?This condition is diagnosed with a medical history and physical exam. You may also have tests, including: ?Electromyogram (EMG). This test measures electrical signals sent by your nerves into the muscles. ?Nerve conduction study. This test measures how well electrical signals pass through your nerves. ?Imaging tests, such as X-rays, ultrasound, and MRI. These tests check for possible causes of your condition. ?How is this treated? ?This condition may be treated with: ?Lifestyle changes. It is important to stop or change the activity that caused your condition. ?Doing exercise and activities to strengthen and stretch your muscles and tendons (physical therapy). ?Making lifestyle changes to help with your condition and learning how to do your daily activities safely (occupational therapy). ?Medicines for pain and inflammation. This may include medicine that is injected into your wrist. ?A wrist splint or brace. ?Surgery. ?Follow these instructions at home: ?If you have a splint or brace: ?Wear the splint or brace as told by your health care provider. Remove it only as told by your health care provider. ?Loosen the splint or brace if your fingers tingle, become numb, or turn cold and blue. ?Keep the splint or brace clean. ?If the splint or brace is not waterproof: ?Do not let it get wet. ?Cover it with a watertight covering when you take a bath or shower. ?Managing pain, stiffness, and swelling ?If directed, put ice on the painful area. To do this: ?If you have a removeable splint or brace, remove  it as told by your health care provider. ?Put ice in a plastic bag. ?Place a towel between your skin and the bag or between the splint or brace and the bag. ?Leave the ice on for 20 minutes, 2-3 times a day. Do not fall asleep with the cold pack on your  skin. ?Remove the ice if your skin turns bright red. This is very important. If you cannot feel pain, heat, or cold, you have a greater risk of damage to the area. ?Move your fingers often to reduce stiffness and swelling. ?General instructions ?Take over-the-counter and prescription medicines only as told by your health care provider. ?Rest your wrist and hand from any activity that may be causing your pain. If your condition is work related, talk with your employer about changes that can be made, such as getting a wrist pad to use while typing. ?Do any exercises as told by your health care provider, physical therapist, or occupational therapist. ?Keep all follow-up visits. This is important. ?Contact a health care provider if: ?You have new symptoms. ?Your pain is not controlled with medicines. ?Your symptoms get worse. ?Get help right away if: ?You have severe numbness or tingling in your wrist or hand. ?Summary ?Carpal tunnel syndrome is a condition that causes pain, numbness, and weakness in your hand and fingers. ?It is usually caused by repeated wrist motions. ?Lifestyle changes and medicines are used to treat carpal tunnel syndrome. Surgery may be recommended. ?Follow your health care provider's instructions about wearing a splint, resting from activity, keeping follow-up visits, and calling for help. ?This information is not intended to replace advice given to you by your health care provider. Make sure you discuss any questions you have with your health care provider. ?Document Revised: 07/28/2019 Document Reviewed: 07/28/2019 ?Elsevier Patient Education ? [place]. ? ?

## 2021-07-24 ENCOUNTER — Encounter: Payer: Self-pay | Admitting: Physician Assistant

## 2021-07-24 ENCOUNTER — Ambulatory Visit (INDEPENDENT_AMBULATORY_CARE_PROVIDER_SITE_OTHER): Payer: Medicaid Other | Admitting: Obstetrics and Gynecology

## 2021-07-24 ENCOUNTER — Encounter: Payer: Self-pay | Admitting: Obstetrics and Gynecology

## 2021-07-24 VITALS — BP 136/86 | HR 86

## 2021-07-24 DIAGNOSIS — N3281 Overactive bladder: Secondary | ICD-10-CM | POA: Diagnosis not present

## 2021-07-24 DIAGNOSIS — G5603 Carpal tunnel syndrome, bilateral upper limbs: Secondary | ICD-10-CM | POA: Insufficient documentation

## 2021-07-24 MED ORDER — SOLIFENACIN SUCCINATE 10 MG PO TABS: 10.0000 mg | ORAL_TABLET | Freq: Every day | ORAL | 1 refills | Status: DC

## 2021-07-24 NOTE — Progress Notes (Signed)
[facility] ?Return Visit ? ?SUBJECTIVE  ?History of Present Illness: ?Sabrina [name] is a 61 y.o. female seen in follow-up for OAB. Plan at last visit was to start vesicare '5mg'$ . Previously attended pelvic PT but states that insurance would not cover more than 2 appointments.  ? ?She was not sure she has been taking the medication- states if she did take it then it did not help. She is still leaking large amounts with urgency. Called pharmacy and they confirmed that she did pick up a prescription in March but did not get a refill.  ? ?Past Medical History: ?Patient  has a past medical history of Anxiety, Arthritis, Bipolar disorder ([place]), Breast cancer ([place]), Chest pain, Chest pain (10/2015), Chronic bronchitis ([place]), Chronic lower back pain, Cough variant asthma (04/26/2020), Depression, Family history of brain cancer, Family history of breast cancer, Family history of prostate cancer, Family history of throat cancer, GERD (gastroesophageal reflux disease), Gout, History of echocardiogram, Hyperlipidemia LDL goal < 100, Hypertension, Lactose intolerance, Migraine, Mixed restrictive and obstructive lung disease ([place]), Morbid obesity with BMI of 40.0-44.9, adult ([place]), Rheumatoid arthritis ([place]), Seizures ([place]), Swelling, and Type II diabetes mellitus ([place]).  ? ?Past Surgical History: ?She  has a past surgical history that includes Craniotomy (1971; 1972); Cataract extraction (Right, 10/2010); Lesion removal (Left, 08/24/2014); Cardiac catheterization (N/A, 11/15/2014); Breast lumpectomy with radioactive seed localization (Right, 02/22/2019); Breast lumpectomy; and Breast biopsy (Right, 01/06/2019).  ? ?Medications: ?She has a current medication list which includes the following prescription(s): solifenacin, albuterol, aspirin ec, atorvastatin, azelastine, true metrix meter, budesonide-formoterol, cetirizine, divalproex, epinephrine, fluticasone, glucose blood, hydralazine, hydrochlorothiazide,  ibuprofen, levofloxacin, metformin, methylprednisolone, omeprazole, spacer/aero-holding chambers, and trueplus lancets 28g.  ? ?Allergies: ?Patient is allergic to benzonatate, latex, losartan, penicillins, lisinopril, and oxycodone-acetaminophen.  ? ?Social History: ?Patient  reports that she has never smoked. She has never used smokeless tobacco. She reports that she does not drink alcohol and does not use drugs.  ?  ?  ?OBJECTIVE  ?  ? ?Physical Exam: ?Vitals:  ? 07/24/21 1508  ?BP: 136/86  ?Pulse: 86  ? ?Gen: No apparent distress, A&O x 3. ? ?Detailed Urogynecologic Evaluation:  ?Deferred.   ? ?ASSESSMENT AND PLAN  ?  ?Sabrina [name] is a 61 y.o. with:  ?1. Overactive bladder   ? ?- Will increase dose of solifenacin to '10mg'$  daily.  ?- She reports that she would like to return to PT but not sure insurance will pay (only attended two visits). New referral provided and patient was given phone number to schedule to enquire about copay.  ? ?Return 2 months ? ?Jaquita Folds, MD ? ? ?

## 2021-08-01 ENCOUNTER — Ambulatory Visit: Payer: Medicaid Other | Attending: Internal Medicine | Admitting: Pharmacist

## 2021-08-01 ENCOUNTER — Ambulatory Visit (INDEPENDENT_AMBULATORY_CARE_PROVIDER_SITE_OTHER): Payer: Medicaid Other | Admitting: Allergy

## 2021-08-01 ENCOUNTER — Encounter: Payer: Self-pay | Admitting: Allergy

## 2021-08-01 ENCOUNTER — Encounter: Payer: Self-pay | Admitting: Pharmacist

## 2021-08-01 VITALS — BP 116/79 | HR 70

## 2021-08-01 VITALS — BP 148/78 | HR 74 | Temp 97.2°F | Resp 20 | Ht 64.0 in | Wt 273.2 lb

## 2021-08-01 DIAGNOSIS — J3089 Other allergic rhinitis: Secondary | ICD-10-CM | POA: Diagnosis not present

## 2021-08-01 DIAGNOSIS — J45991 Cough variant asthma: Secondary | ICD-10-CM

## 2021-08-01 DIAGNOSIS — I10 Essential (primary) hypertension: Secondary | ICD-10-CM

## 2021-08-01 DIAGNOSIS — E119 Type 2 diabetes mellitus without complications: Secondary | ICD-10-CM | POA: Insufficient documentation

## 2021-08-01 DIAGNOSIS — F319 Bipolar disorder, unspecified: Secondary | ICD-10-CM | POA: Diagnosis not present

## 2021-08-01 DIAGNOSIS — H1013 Acute atopic conjunctivitis, bilateral: Secondary | ICD-10-CM | POA: Diagnosis not present

## 2021-08-01 DIAGNOSIS — K219 Gastro-esophageal reflux disease without esophagitis: Secondary | ICD-10-CM

## 2021-08-01 MED ORDER — CETIRIZINE HCL 10 MG PO TABS: 10.0000 mg | ORAL_TABLET | Freq: Two times a day (BID) | ORAL | 5 refills | Status: DC

## 2021-08-01 NOTE — Progress Notes (Signed)
? ? ?Follow-up Note ? ?RE: Sabrina [name] MRN: [id number] DOB: 1961-03-22 ?Date of Office Visit: 08/01/2021 ? ? ?History of present illness: ?Sabrina [name] is a 61 y.o. female presenting today for follow-up of allergic rhinitis with conjunctivitis, asthma and reflux.  Also has a history of food induced pruritus.  She was last seen in the office on 04/04/2021 by myself. ? ?She lives in an apartment complex and states it is covered with trees and there is a tree outside that drops these brown prickly things and history really seems to bother her allergies.  She is noticing more runny nose and watery eyes as well as cough that she has continued to have.  She also states she does eat cats and sometimes will wrap them.  She is not using the Astelin asthma.  He is using Zyrtec 1 tablet a day when she takes it and is not sure if this is.  She also should have access to [facility] for nasal congestion but also has not taken this. ?With her asthma she continues to have cough but she is not using the Symbicort on a consistent basis.  She states she tried to use it but has not been consistent sting.  She also has access to her albuterol inhaler that she will use if she gets symptomatic.  It appears she is no longer on the ARB for BP control. ?She does use omeprazole for reflux control. ?Testing to tomato and lemon in the past were positive given that she has been avoiding these and she does have an epinephrine device that she has not needed to use. ? ?Review of systems: ?Review of Systems  ?Constitutional: Negative.   ?HENT:  Positive for postnasal drip.   ?Eyes:   ?     See HPI  ?Respiratory:  Positive for cough.   ?Cardiovascular: Negative.   ?Gastrointestinal: Negative.   ?Musculoskeletal: Negative.   ?Skin: Negative.   ?Allergic/Immunologic: Negative.   ?Neurological: Negative.    ? ?All other systems negative unless noted above in HPI ? ?Past medical/social/surgical/family history have been reviewed and are  unchanged unless specifically indicated below. ? ?No changes ? ?Medication List: ?Current Outpatient Medications  ?Medication Sig Dispense Refill  ? albuterol (VENTOLIN HFA) 108 (90 Base) MCG/ACT inhaler Inhale 2 puffs into the lungs every 6 (six) hours as needed for wheezing or shortness of breath. 8.5 g 1  ? aspirin EC 81 MG tablet Take 1 tablet (81 mg total) by mouth daily. For heart health    ? atorvastatin (LIPITOR) 10 MG tablet TAKE 1 TABLET (10 MG TOTAL) BY MOUTH DAILY. 30 tablet 3  ? azelastine (ASTELIN) 0.1 % nasal spray Place 1-2 sprays into both nostrils 2 (two) times daily. 30 mL 5  ? Blood Glucose Monitoring Suppl (TRUE METRIX METER) w/Device KIT Use as directed 1 kit 0  ? budesonide-formoterol (SYMBICORT) 160-4.5 MCG/ACT inhaler Inhale 2 puffs into the lungs 2 (two) times daily. 2 puffs twice a day with a spacer. Rinse mouth after use. 10.2 g 5  ? divalproex (DEPAKOTE ER) 250 MG 24 hr tablet Take 3 tablets (750 mg total) by mouth at bedtime. For mood stabilization 90 tablet 0  ? EPINEPHrine (EPIPEN 2-PAK) 0.3 mg/0.3 mL IJ SOAJ injection Inject 0.3 mg into the muscle as needed for anaphylaxis. 1 each 1  ? fluticasone (FLONASE) 50 MCG/ACT nasal spray Place 2 sprays into both nostrils daily. 18.2 g 0  ? glucose blood (TRUE METRIX BLOOD GLUCOSE TEST) test strip  Use as instructed 100 each 12  ? hydrALAZINE (APRESOLINE) 50 MG tablet TAKE 1 TABLET (50 MG TOTAL) BY MOUTH 2 (TWO) TIMES DAILY. . 60 tablet 2  ? hydrochlorothiazide (HYDRODIURIL) 25 MG tablet Take 1 tablet (25 mg total) by mouth daily. 90 tablet 0  ? ibuprofen (ADVIL) 600 MG tablet Take 1 tablet (600 mg total) by mouth every 8 (eight) hours as needed. 30 tablet 0  ? levofloxacin (LEVAQUIN) 500 MG tablet Take 1 tablet (500 mg total) by mouth daily. 7 tablet 0  ? methylPREDNISolone (MEDROL DOSEPAK) 4 MG TBPK tablet Take 24 mg on day 1, 20 mg on day 2, 16 mg on day 3, 12 mg on day 4, 8 mg on day 5, 4 mg on day 6. 21 tablet 0  ? omeprazole (PRILOSEC)  20 MG capsule TAKE 1 CAPSULE (20 MG TOTAL) BY MOUTH DAILY. 30 capsule 5  ? solifenacin (VESICARE) 10 MG tablet Take 1 tablet (10 mg total) by mouth daily. 90 tablet 1  ? Spacer/Aero-Holding Chambers DEVI Use Spacer with Albuterol inhaler as needed 1 Container 0  ? TRUEPLUS LANCETS 28G MISC Use as directed 100 each 6  ? cetirizine (ZYRTEC) 10 MG tablet Take 1 tablet (10 mg total) by mouth 2 (two) times daily. 60 tablet 5  ? metFORMIN (GLUCOPHAGE) 500 MG tablet TAKE 1 TABLET (500 MG TOTAL) BY MOUTH DAILY WITH BREAKFAST. FOR DIABETES MANANGEMENT 90 tablet 3  ? ?No current facility-administered medications for this visit.  ?  ? ?Known medication allergies: ?Allergies  ?Allergen Reactions  ? Benzonatate Other (See Comments)  ?  Made her cough worse  ? Latex Dermatitis and Other (See Comments)  ?  Hands became scaly  ? Losartan Cough  ? Penicillins Other (See Comments)  ?  Did it involve swelling of the face/tongue/throat, SOB, or low BP? Unk ?Did it involve sudden or severe rash/hives, skin peeling, or any reaction on the inside of your mouth or nose? Unk ?Did you need to seek medical attention at a hospital or doctor's office? Unk ?When did it last happen? "I was little; I don't remember, but my parents told me to never take it."  ?If all above answers are ?NO?, may proceed with cephalosporin use. ? ?  ? Lisinopril Cough  ? Oxycodone-Acetaminophen Nausea And Vomiting  ?  Pt thinks she may be able to take with food (??)  ? ? ? ?Physical examination: ?Blood pressure (!) 148/78, pulse 74, temperature (!) 97.2 ?F (36.2 ?C), temperature source Temporal, resp. rate 20, height '5\' 4"'  (1.626 m), weight 273 lb 3.2 oz (123.9 kg), SpO2 100 %. ? ?General: Alert, interactive, in no acute distress. ?HEENT: PERRLA, TMs pearly gray, turbinates minimally edematous without discharge, post-pharynx non erythematous. ?Neck: Supple without lymphadenopathy. ?Lungs: Clear to auscultation without wheezing, rhonchi or rales. {no increased work of  breathing. ?CV: Normal S1, S2 without murmurs. ?Abdomen: Nondistended, nontender. ?Skin: Warm and dry, without lesions or rashes. ?Extremities:  No clubbing, cyanosis or edema. ?Neuro:   Grossly intact. ? ?Diagnositics/Labs: ? ?Spirometry: FEV1: 1.5 L 70%, FVC: 1.82 L 65% predicted.  This is quite stable for her ? ?Assessment and plan: ?  ?Allergic rhinitis with conjunctivitis ?- continue avoidance measures for grass pollen, weed pollen, tree pollen, dust mite, cockroach, cat, and dog.  ?- Use azelastine (Astelin) 2 sprays each nostril twice daily as needed for runny nose/nasal drainage. This can help with your cough since you are having drainage. ?- Use saline rinse kit prior  to nasal spray use.  Use distilled water or boil water and bring to room temperature (do not use tap water).  ?- Use fluticasone nasal spray 2 sprays each nostril once a day as needed for stuffy nose ?- Can take Zyrtec 1 tab twice a day as needed ? ?Cough variant asthma ?- have access to albuterol inhaler 2 puffs via spacer or albuterol 1 vial via nebulizer every 4-6 hours as needed for cough/wheeze/shortness of breath/chest tightness.  May use 15-20 minutes prior to activity.   Monitor frequency of use.   ?- use pump inhaler with spacer device (plastic tube).   ?- continue  Symbicort 160/4.5 mcg 2 puffs twice a day with spacer. Use this every day. Set a reminder on your phone or set it next to your tooth brush as a reminder   ?Asthma control goals:  ?Full participation in all desired activities (may need albuterol before activity) ?Albuterol use two time or less a week on average (not counting use with activity) ?Cough interfering with sleep two time or less a month ?Oral steroids no more than once a year ?No hospitalizations ? ?-concerned that continued dry cough could be due to Losartan use which is an ARB type medication and this group of medications can cause cough.  Would discuss with Dr. Wynetta Emery regarding alterative to  Losartan ? ?GERD ?- can use omeprazole daily for reflux symptom control ? ?Itching ?- Continue to avoid foods that cause itching ?- Blood work to tomato and lemon were elevated. Recommend avoiding these foods along with

## 2021-08-01 NOTE — Progress Notes (Signed)
? ?S:    ?PCP: Dr. Wynetta Emery  ? ?No chief complaint on file. ? ?Sabrina [name] is a 61 y.o. female who presents for hypertension evaluation, education, and management. PMH is significant for HTN, HL,, DM,hx of + RA factor, Bipolar Disorder (followed at [facility]), asthma, chronic cough (dx with cough variant asthma and allergic rhinittis by Dr. 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 Patient was referred and last seen by Primary Care Provider, Dr. Wynetta Emery, on 06/04/2021. I saw her on 07/02/2021 and added HCTZ to her regimen. We also discussed addition of amlodipine, however, I opted for HCTZ even in the setting of urinary frequency d/t her LE edema. Since that visit, her BP has been better at two office visits, one on 07/23/21 and another on 07/24/2021.  ? ?Today, patient arrives in good spirits and presents without assistance. Denies dizziness, headache, blurred vision, swelling.  ? ?Patient reports hypertension is longstanding.  ? ?Family/Social history:  ?Fhx: HTN, DM, HLD, heart disease/MI  ?Tobacco: never  ?Alcohol: none ? ?Medication adherence reported. Of note, she tells me today that she misunderstood and has only been taking the HCTZ. She is not taking the hydralazine. Patient has taken HCTZ today. ? ?Current antihypertensives include:  ?-Hydralazine 50 mg TID ("still getting used to it") - not taking ?-HCTZ 25 mg daily ? ?Reported home BP readings:  ?-None  ? ?Patient reported dietary habits:  ?-Sodium: does not add salt to her food. Does not cook with much salt.  ?-Caffeine: does drink coffee but drinks decaf  ? ?Patient-reported exercise habits: none.  ? ?O:  ?Vitals:  ? 08/01/21 0937  ?BP: 116/79  ?Pulse: 70  ? ? ?Last 3 Office BP readings: ?BP Readings from Last 3 Encounters:  ?08/01/21 116/79  ?07/24/21 136/86  ?07/23/21 133/85  ? ?BMET ?   ?Component Value Date/Time  ? NA 141 02/11/2021 0922  ? K 4.5 02/11/2021 0922  ? CL 101 02/11/2021 0922  ? CO2 26 02/11/2021  0922  ? GLUCOSE 94 02/11/2021 0922  ? GLUCOSE 117 (H) 10/03/2019 0523  ? BUN 11 02/11/2021 0922  ? CREATININE 0.74 02/11/2021 0922  ? CREATININE 0.78 12/05/2015 0848  ? CALCIUM 9.7 02/11/2021 0922  ? GFRNONAA 80 12/13/2019 1232  ? GFRNONAA 67 10/11/2015 1222  ? GFRAA 92 12/13/2019 1232  ? GFRAA 77 10/11/2015 1222  ? ? ?Renal function: ?CrCl cannot be calculated (Patient's most recent lab result is older than the maximum 21 days allowed.). ? ?Clinical ASCVD: No  ?The 10-year ASCVD risk score (Arnett DK, et al., 2019) is: 8.8% ?  Values used to calculate the score: ?    Age: 35 years ?    Sex: Female ?    Is Non-Hispanic African American: Yes ?    Diabetic: Yes ?    Tobacco smoker: No ?    Systolic Blood Pressure: 782 mmHg ?    Is BP treated: Yes ?    HDL Cholesterol: 85 mg/dL ?    Total Cholesterol: 212 mg/dL ? ?A/P: ?Hypertension longstanding currently at goal on current medications. BP goal < 130/80 mmHg. Medication adherence is suboptimal, however, her BP has been better. I have instructed her to hold off on the hydralazine and take HCTZ only for now. Will order labs today and have her f/u with me in the 1 month.   ?-Continue HCTZ 25 mg daily.  ?-Hold hyralazine for now.  ?-Patient educated on purpose, proper use, and  potential adverse effects of HCTZ.  ?-F/u labs ordered - BMP ?-Counseled on lifestyle modifications for blood pressure control including reduced dietary sodium, increased exercise, adequate sleep. ?-Encouraged patient to check BP at home and bring log of readings to next visit. Counseled on proper use of home BP cuff.  ? ?Results reviewed and written information provided. Patient verbalized understanding of treatment plan. Total time in face-to-face counseling 30 minutes.  ? ?F/u clinic visit in 1 month. ? ?Benard Halsted, PharmD, BCACP, CPP ?Clinical Pharmacist ?[place] ?[phone] ? ? ?

## 2021-08-01 NOTE — Patient Instructions (Addendum)
Allergic rhinitis with conjunctivitis ?- continue avoidance measures for grass pollen, weed pollen, tree pollen, dust mite, cockroach, cat, and dog.  ?- Use azelastine (Astelin) 2 sprays each nostril twice daily as needed for runny nose/nasal drainage. This can help with your cough since you are having drainage. ?- Use saline rinse kit prior to nasal spray use.  Use distilled water or boil water and bring to room temperature (do not use tap water).  ?- Use fluticasone nasal spray 2 sprays each nostril once a day as needed for stuffy nose ?- Can take Zyrtec 1 tab twice a day as needed ? ?Cough variant asthma ?- have access to albuterol inhaler 2 puffs via spacer or albuterol 1 vial via nebulizer every 4-6 hours as needed for cough/wheeze/shortness of breath/chest tightness.  May use 15-20 minutes prior to activity.   Monitor frequency of use.   ?- use pump inhaler with spacer device (plastic tube).   ?- continue  Symbicort 160/4.5 mcg 2 puffs twice a day with spacer. Use this every day. Set a reminder on your phone or set it next to your tooth brush as a reminder   ?Asthma control goals:  ?Full participation in all desired activities (may need albuterol before activity) ?Albuterol use two time or less a week on average (not counting use with activity) ?Cough interfering with sleep two time or less a month ?Oral steroids no more than once a year ?No hospitalizations ? ?-concerned that continued dry cough could be due to Losartan use which is an ARB type medication and this group of medications can cause cough.  Would discuss with Dr. Wynetta Emery regarding alterative to Losartan ? ?GERD ?- can use omeprazole daily for reflux symptom control ? ?Itching ?- Continue to avoid foods that cause itching ?- Blood work to tomato and lemon were elevated. Recommend avoiding these foods along with other foods that cause itching/ rash and see if this helps. Have access to Epipen 0.3 mg in case of allergic reaction ?- Testing to orange  was negative. ? ?Follow-up in 4-6 months or sooner if needed ? ?

## 2021-08-02 LAB — CMP14+EGFR
ALT: 25 IU/L (ref 0–32)
AST: 22 IU/L (ref 0–40)
Albumin/Globulin Ratio: 1.3 (ref 1.2–2.2)
Albumin: 4.4 g/dL (ref 3.8–4.9)
Alkaline Phosphatase: 79 IU/L (ref 44–121)
BUN/Creatinine Ratio: 13 (ref 9–23)
BUN: 13 mg/dL (ref 6–24)
Bilirubin Total: <0.2 mg/dL (ref 0.0–1.2)
CO2: 25 mmol/L (ref 20–29)
Calcium: 10.3 mg/dL — ABNORMAL HIGH (ref 8.7–10.2)
Chloride: 98 mmol/L (ref 96–106)
Creatinine, Ser: 0.99 mg/dL (ref 0.57–1.00)
Globulin, Total: 3.3 g/dL (ref 1.5–4.5)
Glucose: 89 mg/dL (ref 70–99)
Potassium: 4.4 mmol/L (ref 3.5–5.2)
Sodium: 137 mmol/L (ref 134–144)
Total Protein: 7.7 g/dL (ref 6.0–8.5)
eGFR: 66 mL/min/{1.73_m2} (ref >59)

## 2021-08-07 ENCOUNTER — Other Ambulatory Visit: Payer: Self-pay | Admitting: Internal Medicine

## 2021-08-07 DIAGNOSIS — E1169 Type 2 diabetes mellitus with other specified complication: Secondary | ICD-10-CM

## 2021-08-07 NOTE — Telephone Encounter (Signed)
Rx was last prescribed 03/2020 ?

## 2021-08-08 ENCOUNTER — Other Ambulatory Visit: Payer: Self-pay

## 2021-08-08 MED ORDER — METFORMIN HCL 500 MG PO TABS
ORAL_TABLET | ORAL | 3 refills | Status: DC
  Filled 2021-08-08 – 2021-08-16 (×2): qty 90, 90d supply, fill #0
  Filled 2021-11-07: qty 90, 90d supply, fill #1
  Filled 2022-04-07: qty 90, 90d supply, fill #2
  Filled 2022-07-29: qty 90, 90d supply, fill #3

## 2021-08-15 ENCOUNTER — Other Ambulatory Visit: Payer: Self-pay

## 2021-08-16 ENCOUNTER — Other Ambulatory Visit: Payer: Self-pay

## 2021-08-22 ENCOUNTER — Encounter: Payer: Medicaid Other | Attending: Obstetrics and Gynecology | Admitting: Physical Therapy

## 2021-08-22 ENCOUNTER — Encounter: Payer: Self-pay | Admitting: Physical Therapy

## 2021-08-22 DIAGNOSIS — M6281 Muscle weakness (generalized): Secondary | ICD-10-CM | POA: Insufficient documentation

## 2021-08-22 DIAGNOSIS — R279 Unspecified lack of coordination: Secondary | ICD-10-CM | POA: Diagnosis present
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
## 2021-08-29 ENCOUNTER — Encounter: Payer: Medicaid Other | Attending: Obstetrics and Gynecology | Admitting: Physical Therapy

## 2021-08-29 ENCOUNTER — Encounter: Payer: Self-pay | Admitting: Physical Therapy

## 2021-08-29 DIAGNOSIS — R279 Unspecified lack of coordination: Secondary | ICD-10-CM | POA: Insufficient documentation

## 2021-08-29 DIAGNOSIS — R293 Abnormal posture: Secondary | ICD-10-CM | POA: Diagnosis present

## 2021-08-29 DIAGNOSIS — M6281 Muscle weakness (generalized): Secondary | ICD-10-CM | POA: Insufficient documentation

## 2021-08-29 NOTE — Patient Instructions (Addendum)
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
## 2021-09-02 ENCOUNTER — Ambulatory Visit: Payer: Medicaid Other | Attending: Internal Medicine | Admitting: Pharmacist

## 2021-09-02 VITALS — BP 139/82 | HR 66

## 2021-09-02 DIAGNOSIS — M1711 Unilateral primary osteoarthritis, right knee: Secondary | ICD-10-CM | POA: Insufficient documentation

## 2021-09-02 DIAGNOSIS — Z560 Unemployment, unspecified: Secondary | ICD-10-CM | POA: Diagnosis not present

## 2021-09-02 DIAGNOSIS — E119 Type 2 diabetes mellitus without complications: Secondary | ICD-10-CM | POA: Insufficient documentation

## 2021-09-02 DIAGNOSIS — I10 Essential (primary) hypertension: Secondary | ICD-10-CM

## 2021-09-02 DIAGNOSIS — E785 Hyperlipidemia, unspecified: Secondary | ICD-10-CM | POA: Diagnosis not present

## 2021-09-02 NOTE — Progress Notes (Signed)
S:    PCP: Dr. Wynetta Emery   No chief complaint on file.  Sabrina [name] is a 61 y.o. female who presents for hypertension evaluation, education, and management. PMH is significant for HTN, HL,, DM,hx of + RA factor, Bipolar Disorder (followed at [facility]), asthma, chronic cough (dx with cough variant asthma and allergic rhinittis by Dr. 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 Patient was referred and last seen by Primary Care Provider, Dr. Wynetta Emery, on 06/04/2021. I saw her on 07/02/2021 and added HCTZ to her regimen. We also discussed addition of amlodipine, however, I opted for HCTZ even in the setting of urinary frequency d/t her LE edema. I saw her on 08/01/2021 and her BP was pretty good.    Today, patient arrives in good spirits and presents without assistance. Denies dizziness, headache, blurred vision, swelling. Does endorse some stress with recent issues regarding unemployment but has no issues affording rx medications at this time.   Patient reports hypertension is longstanding.   Family/Social history:  Fhx: HTN, DM, HLD, heart disease/MI  Tobacco: never  Alcohol: none  Medication adherence reported.   Current antihypertensives include:  -Hydralazine 50 mg TID ("still getting used to it") - not taking all the time. Usually takes once a day.  -HCTZ 25 mg daily  Reported home BP readings:  -None   Patient reported dietary habits:  -Sodium: does not add salt to her food. Does not cook with much salt.  -Caffeine: does drink coffee but drinks decaf   Patient-reported exercise habits:  - Tries exercising more but allergies limit.   O:  Vitals:   09/02/21 1102  BP: 139/82  Pulse: 66    Last 3 Office BP readings: BP Readings from Last 3 Encounters:  09/02/21 139/82  08/01/21 (!) 148/78  08/01/21 116/79   BMET    Component Value Date/Time   NA 137 08/01/2021 0945   K 4.4 08/01/2021 0945   CL 98 08/01/2021 0945   CO2 25  08/01/2021 0945   GLUCOSE 89 08/01/2021 0945   GLUCOSE 117 (H) 10/03/2019 0523   BUN 13 08/01/2021 0945   CREATININE 0.99 08/01/2021 0945   CREATININE 0.78 12/05/2015 0848   CALCIUM 10.3 (H) 08/01/2021 0945   GFRNONAA 80 12/13/2019 1232   GFRNONAA 67 10/11/2015 1222   GFRAA 92 12/13/2019 1232   GFRAA 77 10/11/2015 1222    Renal function: CrCl cannot be calculated (Patient's most recent lab result is older than the maximum 21 days allowed.).  Clinical ASCVD: No  The 10-year ASCVD risk score (Arnett DK, et al., 2019) is: 14.9%   Values used to calculate the score:     Age: 71 years     Sex: Female     Is Non-Hispanic African American: Yes     Diabetic: Yes     Tobacco smoker: No     Systolic Blood Pressure: 616 mmHg     Is BP treated: Yes     HDL Cholesterol: 85 mg/dL     Total Cholesterol: 212 mg/dL  A/P: Hypertension longstanding currently close to goal on current medications. BP goal < 130/80 mmHg. Medication adherence is suboptimal, however, her BP has been better. I have instructed her to hold off on the hydralazine and take HCTZ only for now. -Continue HCTZ 25 mg daily.  -Hold hyralazine for now.  -Patient educated on purpose, proper use, and potential adverse effects of HCTZ.  -Counseled on lifestyle modifications for  blood pressure control including reduced dietary sodium, increased exercise, adequate sleep. -Encouraged patient to check BP at home and bring log of readings to next visit. Counseled on proper use of home BP cuff.   Results reviewed and written information provided. Patient verbalized understanding of treatment plan. Total time in face-to-face counseling 30 minutes.   F/u clinic visit in 1 month.  Benard Halsted, PharmD, [facility] [place], [facility] [phone]

## 2021-09-05 ENCOUNTER — Encounter: Payer: Self-pay | Admitting: Physical Therapy

## 2021-09-05 ENCOUNTER — Encounter: Payer: Medicaid Other | Admitting: Physical Therapy

## 2021-09-05 DIAGNOSIS — R293 Abnormal posture: Secondary | ICD-10-CM

## 2021-09-05 DIAGNOSIS — M6281 Muscle weakness (generalized): Secondary | ICD-10-CM | POA: Diagnosis not present

## 2021-09-05 DIAGNOSIS — R279 Unspecified lack of coordination: Secondary | ICD-10-CM

## 2021-09-05 NOTE — Patient Instructions (Signed)
Urge Incontinence  Ideal urination frequency is every 2-4 wakeful hours, which equates to 5-8 times within a 24-hour period.   Urge incontinence is leakage that occurs when the bladder muscle contracts, creating a sudden need to go before getting to the bathroom.   Going too often when your bladder isn't actually full can disrupt the body's automatic signals to store and hold urine longer, which will increase urgency/frequency.  In this case, the bladder "is running the show" and strategies can be learned to retrain this pattern.   One should be able to control the first urge to urinate, at around 150mL.  The bladder can hold up to a "grande latte," or 400mL. To help you gain control, practice the Urge Drill below when urgency strikes.  This drill will help retrain your bladder signals and allow you to store and hold urine longer.  The overall goal is to stretch out your time between voids to reach a more manageable voiding schedule.    Practice your "quick flicks" often throughout the day (each waking hour) even when you don't need feel the urge to go.  This will help strengthen your pelvic floor muscles, making them more effective in controlling leakage.  Urge Drill  When you feel an urge to go, follow these steps to regain control: Stop what you are doing and be still Take one deep breath, directing your air into your abdomen Think an affirming thought, such as "I've got this." Do 5 quick flicks of your pelvic floor Walk with control to the bathroom to void, or delay voiding    Kensey Luepke, PT [facility] [address], [address] [place], [place] [address] W: [phone] [email or web address]  
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
## 2021-09-12 ENCOUNTER — Encounter: Payer: Self-pay | Admitting: Physical Therapy

## 2021-09-12 ENCOUNTER — Encounter: Payer: Medicaid Other | Admitting: Physical Therapy

## 2021-09-12 DIAGNOSIS — M6281 Muscle weakness (generalized): Secondary | ICD-10-CM | POA: Diagnosis not present

## 2021-09-12 DIAGNOSIS — R279 Unspecified lack of coordination: Secondary | ICD-10-CM

## 2021-09-12 DIAGNOSIS — R293 Abnormal posture: Secondary | ICD-10-CM
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
## 2021-09-20 ENCOUNTER — Ambulatory Visit: Payer: Medicaid Other | Admitting: Obstetrics and Gynecology

## 2021-09-20 NOTE — Progress Notes (Deleted)
[facility] Return Visit  SUBJECTIVE  History of Present Illness: Sabrina [name] is a 61 y.o. female seen in follow-up for OAB. Plan at last visit was to start vesicare '5mg'$ . Previously attended pelvic PT but states that insurance would not cover more than 2 appointments.   She was not sure she has been taking the medication- states if she did take it then it did not help. She is still leaking large amounts with urgency. Called pharmacy and they confirmed that she did pick up a prescription in March but did not get a refill.   Past Medical History: Patient  has a past medical history of Anxiety, Arthritis, Bipolar disorder ([place]), Breast cancer ([place]), Carpal tunnel syndrome (07/22/2021), Chest pain, Chest pain (10/2015), Chronic bronchitis ([place]), Chronic lower back pain, Cough variant asthma (04/26/2020), Depression, Family history of brain cancer, Family history of breast cancer, Family history of prostate cancer, Family history of throat cancer, GERD (gastroesophageal reflux disease), Gout, History of echocardiogram, Hyperlipidemia LDL goal < 100, Hypertension, Lactose intolerance, Migraine, Mixed restrictive and obstructive lung disease ([place]), Morbid obesity with BMI of 40.0-44.9, adult ([place]), Rheumatoid arthritis ([place]), Seizures ([place]), Swelling, and Type II diabetes mellitus ([place]).   Past Surgical History: She  has a past surgical history that includes Craniotomy (1971; 1972); Cataract extraction (Right, 10/2010); Lesion removal (Left, 08/24/2014); Cardiac catheterization (N/A, 11/15/2014); Breast lumpectomy with radioactive seed localization (Right, 02/22/2019); Breast lumpectomy; and Breast biopsy (Right, 01/06/2019).   Medications: She has a current medication list which includes the following prescription(s): albuterol, aspirin ec, atorvastatin, azelastine, true metrix meter, budesonide-formoterol, cetirizine, divalproex, epinephrine, fluticasone, glucose blood,  hydralazine, hydrochlorothiazide, ibuprofen, levofloxacin, metformin, methylprednisolone, omeprazole, solifenacin, spacer/aero-holding chambers, and trueplus lancets 28g.   Allergies: Patient is allergic to benzonatate, latex, losartan, penicillins, lisinopril, and oxycodone-acetaminophen.   Social History: Patient  reports that she has never smoked. She has never used smokeless tobacco. She reports that she does not drink alcohol and does not use drugs.      OBJECTIVE     Physical Exam: There were no vitals filed for this visit.  Gen: No apparent distress, A&O x 3.  Detailed Urogynecologic Evaluation:  Deferred.    ASSESSMENT AND PLAN    Ms. Games is a 61 y.o. with:  No diagnosis found.  - Will increase dose of solifenacin to '10mg'$  daily.  - She reports that she would like to return to PT but not sure insurance will pay (only attended two visits). New referral provided and patient was given phone number to schedule to enquire about copay.   Return 2 months  Jaquita Folds, MD

## 2021-09-26 ENCOUNTER — Encounter: Payer: Medicaid Other | Admitting: Physical Therapy

## 2021-09-26 ENCOUNTER — Encounter: Payer: Self-pay | Admitting: Physical Therapy

## 2021-09-26 DIAGNOSIS — M6281 Muscle weakness (generalized): Secondary | ICD-10-CM | POA: Diagnosis not present

## 2021-09-26 DIAGNOSIS — R279 Unspecified lack of coordination: Secondary | ICD-10-CM
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
## 2021-10-03 ENCOUNTER — Encounter: Payer: Medicaid Other | Admitting: Physical Therapy

## 2021-10-03 ENCOUNTER — Other Ambulatory Visit: Payer: Self-pay | Admitting: Internal Medicine

## 2021-10-03 DIAGNOSIS — I10 Essential (primary) hypertension: Secondary | ICD-10-CM

## 2021-10-04 ENCOUNTER — Encounter: Payer: Self-pay | Admitting: Internal Medicine

## 2021-10-04 ENCOUNTER — Ambulatory Visit: Payer: Medicaid Other | Attending: Internal Medicine | Admitting: Internal Medicine

## 2021-10-04 DIAGNOSIS — E782 Mixed hyperlipidemia: Secondary | ICD-10-CM | POA: Insufficient documentation

## 2021-10-04 DIAGNOSIS — M1711 Unilateral primary osteoarthritis, right knee: Secondary | ICD-10-CM | POA: Diagnosis not present

## 2021-10-04 DIAGNOSIS — J45909 Unspecified asthma, uncomplicated: Secondary | ICD-10-CM | POA: Diagnosis not present

## 2021-10-04 DIAGNOSIS — E1169 Type 2 diabetes mellitus with other specified complication: Secondary | ICD-10-CM | POA: Diagnosis not present

## 2021-10-04 DIAGNOSIS — I152 Hypertension secondary to endocrine disorders: Secondary | ICD-10-CM

## 2021-10-04 DIAGNOSIS — Z79899 Other long term (current) drug therapy: Secondary | ICD-10-CM | POA: Diagnosis not present

## 2021-10-04 DIAGNOSIS — Z853 Personal history of malignant neoplasm of breast: Secondary | ICD-10-CM | POA: Diagnosis not present

## 2021-10-04 DIAGNOSIS — E119 Type 2 diabetes mellitus without complications: Secondary | ICD-10-CM | POA: Diagnosis not present

## 2021-10-04 DIAGNOSIS — I10 Essential (primary) hypertension: Secondary | ICD-10-CM | POA: Insufficient documentation

## 2021-10-04 DIAGNOSIS — M25532 Pain in left wrist: Secondary | ICD-10-CM | POA: Diagnosis not present

## 2021-10-04 DIAGNOSIS — E1159 Type 2 diabetes mellitus with other circulatory complications: Secondary | ICD-10-CM

## 2021-10-04 DIAGNOSIS — G5603 Carpal tunnel syndrome, bilateral upper limbs: Secondary | ICD-10-CM | POA: Diagnosis not present

## 2021-10-04 DIAGNOSIS — Z6841 Body Mass Index (BMI) 40.0 and over, adult: Secondary | ICD-10-CM

## 2021-10-04 DIAGNOSIS — Z23 Encounter for immunization: Secondary | ICD-10-CM
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
## 2021-10-04 NOTE — Patient Instructions (Signed)

## 2021-10-04 NOTE — Progress Notes (Signed)
Having pain in wrist  Pain in shoulders.

## 2021-10-05 LAB — LIPID PANEL
Chol/HDL Ratio: 2 ratio (ref 0.0–4.4)
Cholesterol, Total: 171 mg/dL (ref 100–199)
HDL: 85 mg/dL (ref >39)
LDL Chol Calc (NIH): 64 mg/dL (ref 0–99)
Triglycerides: 128 mg/dL (ref 0–149)
VLDL Cholesterol Cal: 22 mg/dL (ref 5–40)

## 2021-10-05 LAB — MICROALBUMIN / CREATININE URINE RATIO
Creatinine, Urine: 59.1 mg/dL
Microalb/Creat Ratio: <5 mg/g creat (ref 0–29)
Microalbumin, Urine: <3 ug/mL

## 2021-10-08 ENCOUNTER — Encounter: Payer: Self-pay | Admitting: Pharmacist

## 2021-10-08 ENCOUNTER — Ambulatory Visit: Payer: Medicaid Other | Attending: Internal Medicine | Admitting: Pharmacist

## 2021-10-08 VITALS — BP 124/87

## 2021-10-08 DIAGNOSIS — E1159 Type 2 diabetes mellitus with other circulatory complications: Secondary | ICD-10-CM

## 2021-10-08 DIAGNOSIS — I152 Hypertension secondary to endocrine disorders: Secondary | ICD-10-CM

## 2021-10-08 DIAGNOSIS — I10 Essential (primary) hypertension: Secondary | ICD-10-CM | POA: Insufficient documentation

## 2021-10-08 DIAGNOSIS — Z79899 Other long term (current) drug therapy: Secondary | ICD-10-CM | POA: Insufficient documentation

## 2021-10-08 NOTE — Progress Notes (Signed)
   S:    PCP: Dr. Wynetta Emery   No chief complaint on file.  Sabrina [name] is a 61 y.o. female who presents for hypertension evaluation, education, and management. PMH is significant for HTN, HL,, DM,hx of + RA factor, Bipolar Disorder (followed at [facility]), asthma, chronic cough (dx with cough variant asthma and allergic rhinittis by Dr. 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 Patient was referred and last seen by Primary Care Provider, Dr. Wynetta Emery, on 10/04/2021. BP at that visit was good.  Today, patient arrives in good spirits and presents without assistance. Denies dizziness, headache, blurred vision, swelling. Does endorse some stress with recent issues regarding unemployment but has no issues affording rx medications at this time.   Patient reports hypertension is longstanding.   Family/Social history:  Fhx: HTN, DM, HLD, heart disease/MI  Tobacco: never  Alcohol: none  Medication adherence reported.   Current antihypertensives include:  -HCTZ 25 mg daily  Reported home BP readings:  -None   Patient reported dietary habits:  -Sodium: does not add salt to her food. Does not cook with much salt.  -Caffeine: does drink coffee but drinks decaf   Patient-reported exercise habits:  - Tries exercising more but allergies limit.   O:  Vitals:   10/08/21 1037  BP: 124/87    Last 3 Office BP readings: BP Readings from Last 3 Encounters:  10/08/21 124/87  10/04/21 127/78  09/02/21 139/82   BMET    Component Value Date/Time   NA 137 08/01/2021 0945   K 4.4 08/01/2021 0945   CL 98 08/01/2021 0945   CO2 25 08/01/2021 0945   GLUCOSE 89 08/01/2021 0945   GLUCOSE 117 (H) 10/03/2019 0523   BUN 13 08/01/2021 0945   CREATININE 0.99 08/01/2021 0945   CREATININE 0.78 12/05/2015 0848   CALCIUM 10.3 (H) 08/01/2021 0945   GFRNONAA 80 12/13/2019 1232   GFRNONAA 67 10/11/2015 1222   GFRAA 92 12/13/2019 1232   GFRAA 77 10/11/2015 1222     Renal function: CrCl cannot be calculated (Patient's most recent lab result is older than the maximum 21 days allowed.).  Clinical ASCVD: No  The 10-year ASCVD risk score (Arnett DK, et al., 2019) is: 8.8%   Values used to calculate the score:     Age: 45 years     Sex: Female     Is Non-Hispanic African American: Yes     Diabetic: Yes     Tobacco smoker: No     Systolic Blood Pressure: 106 mmHg     Is BP treated: Yes     HDL Cholesterol: 85 mg/dL     Total Cholesterol: 171 mg/dL  A/P: Hypertension longstanding currently close to goal on current medications. BP goal < 130/80 mmHg. Medication adherence is optimal. No medication changes today. -Continue HCTZ 25 mg daily.  -Patient educated on purpose, proper use, and potential adverse effects of HCTZ.  -Counseled on lifestyle modifications for blood pressure control including reduced dietary sodium, increased exercise, adequate sleep. -Encouraged patient to check BP at home and bring log of readings to next visit. Counseled on proper use of home BP cuff.   Results reviewed and written information provided. Patient verbalized understanding of treatment plan. Total time in face-to-face counseling 30 minutes.   F/u clinic visit with PCP.  Benard Halsted, PharmD, [place], [place] [phone]

## 2021-10-10 ENCOUNTER — Encounter: Payer: Self-pay | Admitting: Physical Therapy

## 2021-10-10 ENCOUNTER — Other Ambulatory Visit: Payer: Self-pay

## 2021-10-10 ENCOUNTER — Telehealth: Payer: Self-pay | Admitting: Physical Therapy

## 2021-10-10 NOTE — Telephone Encounter (Signed)
Called about her missed appointment today at 13:00. Patient did not realize she had an appointment today. Therapist reminded her of her appointment for next week.  Earlie Counts, PT '@7'$ /13/2023@ 1:44 PM

## 2021-10-11 ENCOUNTER — Ambulatory Visit (INDEPENDENT_AMBULATORY_CARE_PROVIDER_SITE_OTHER): Payer: Medicaid Other

## 2021-10-11 ENCOUNTER — Encounter: Payer: Self-pay | Admitting: Orthopaedic Surgery

## 2021-10-11 ENCOUNTER — Ambulatory Visit (INDEPENDENT_AMBULATORY_CARE_PROVIDER_SITE_OTHER): Payer: Medicaid Other | Admitting: Orthopaedic Surgery

## 2021-10-11 DIAGNOSIS — M654 Radial styloid tenosynovitis [de Quervain]: Secondary | ICD-10-CM

## 2021-10-11 MED ORDER — METHYLPREDNISOLONE ACETATE 40 MG/ML IJ SUSP
13.3300 mg | INTRAMUSCULAR | Status: AC | PRN
  Administered 2021-10-11: 13.33 mg

## 2021-10-11 MED ORDER — BUPIVACAINE HCL 0.25 % IJ SOLN
0.3300 mL | INTRAMUSCULAR | Status: AC | PRN
  Administered 2021-10-11: .33 mL

## 2021-10-11 MED ORDER — LIDOCAINE HCL 1 % IJ SOLN
0.3000 mL | INTRAMUSCULAR | Status: AC | PRN
  Administered 2021-10-11: .3 mL
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
## 2021-10-15 ENCOUNTER — Encounter: Payer: Self-pay | Admitting: Physical Therapy

## 2021-10-15 ENCOUNTER — Encounter: Payer: Medicaid Other | Attending: Obstetrics and Gynecology | Admitting: Physical Therapy

## 2021-10-15 DIAGNOSIS — M6281 Muscle weakness (generalized): Secondary | ICD-10-CM | POA: Diagnosis present

## 2021-10-15 DIAGNOSIS — N3281 Overactive bladder: Secondary | ICD-10-CM | POA: Diagnosis not present

## 2021-10-15 DIAGNOSIS — R293 Abnormal posture: Secondary | ICD-10-CM | POA: Diagnosis not present

## 2021-10-15 DIAGNOSIS — R279 Unspecified lack of coordination: Secondary | ICD-10-CM | POA: Insufficient documentation

## 2021-10-15 NOTE — Patient Instructions (Signed)
Moisturizers They are used in the vagina to hydrate the mucous membrane that make up the vaginal canal. Designed to keep a more normal acid balance (ph) Once placed in the vagina, it will last between two to three days.  Use 2-3 times per week at bedtime  Ingredients to avoid is glycerin and fragrance, can increase chance of infection Should not be used just before sex due to causing irritation Most are gels administered either in a tampon-shaped applicator or as a vaginal suppository. They are non-hormonal.   Types of Moisturizers(internal use)  Vitamin E vaginal suppositories- Whole foods, Amazon Moist Again Coconut oil- can break down condoms Julva- (Do no use if on Tamoxifen) amazon Yes moisturizer- amazon NeuEve Silk , NeuEve Silver for menopausal or over 65 (if have severe vaginal atrophy or cancer treatments use NeuEve Silk for  1 month than move to NeuEve Silver)- Amazon, Neuve.com Olive and Bee intimate cream- [email or web address].com.au Mae vaginal moisturizer- Amazon Aloe Good Clean Love Hyaluronic acid Hyalofemme replens   Creams to use externally on the Vulva area Desert Harvest Releveum (good for for cancer patients that had radiation to the area)- amazon or www.desertharvest.com V-magic cream - amazon Julva-amazon Vital "V Wild Yam salve ( help moisturize and help with thinning vulvar area, does have Beeswax MoodMaid Botanical Pro-Meno Wild Yam Cream- Amazon Desert Harvest Gele Cleo by Damiva labial moisturizer (Amazon,  Coconut or olive oil aloe Good Clean Love Enchanted Rose by intimate rose  Things to avoid in the vaginal area Do not use things to irritate the vulvar area No lotions just specialized creams for the vulva area- Neogyn, V-magic, No soaps; can use Aveeno or Calendula cleanser if needed. Must be gentle No deodorants No douches Good to sleep without underwear to let the vaginal area to air out No scrubbing: spread the lips to let warm water rinse  over labias and pat dry   Enedelia Martorelli, PT [facility] [address], [address] [place], [place] [address] W: [phone] [email or web address]  
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
## 2021-10-16 ENCOUNTER — Other Ambulatory Visit: Payer: Self-pay

## 2021-10-18 ENCOUNTER — Other Ambulatory Visit: Payer: Self-pay | Admitting: Allergy

## 2021-10-18 ENCOUNTER — Other Ambulatory Visit: Payer: Self-pay

## 2021-10-18 DIAGNOSIS — J45991 Cough variant asthma: Secondary | ICD-10-CM

## 2021-10-18 MED ORDER — CETIRIZINE HCL 10 MG PO TABS
10.0000 mg | ORAL_TABLET | Freq: Every day | ORAL | 3 refills | Status: DC
  Filled 2021-10-18: qty 30, 30d supply, fill #0
  Filled 2021-11-14: qty 30, 30d supply, fill #1
  Filled 2021-12-05 – 2021-12-06 (×2): qty 30, 30d supply, fill #2
  Filled 2021-12-22: qty 30, 30d supply, fill #3

## 2021-10-31 ENCOUNTER — Encounter: Payer: Self-pay | Admitting: Physical Therapy

## 2021-11-03 ENCOUNTER — Encounter (HOSPITAL_COMMUNITY): Payer: Self-pay

## 2021-11-03 ENCOUNTER — Emergency Department (HOSPITAL_COMMUNITY): Payer: Medicaid Other

## 2021-11-03 ENCOUNTER — Emergency Department (HOSPITAL_COMMUNITY)
Admission: EM | Admit: 2021-11-03 | Discharge: 2021-11-03 | Disposition: A | Discharge status: Home / Self Care | Payer: Medicaid Other | Attending: Emergency Medicine | Admitting: Emergency Medicine

## 2021-11-03 ENCOUNTER — Other Ambulatory Visit: Payer: Self-pay

## 2021-11-03 DIAGNOSIS — I10 Essential (primary) hypertension: Secondary | ICD-10-CM | POA: Insufficient documentation

## 2021-11-03 DIAGNOSIS — Z7982 Long term (current) use of aspirin: Secondary | ICD-10-CM | POA: Insufficient documentation

## 2021-11-03 DIAGNOSIS — R059 Cough, unspecified: Secondary | ICD-10-CM | POA: Insufficient documentation

## 2021-11-03 DIAGNOSIS — Z79899 Other long term (current) drug therapy: Secondary | ICD-10-CM | POA: Insufficient documentation

## 2021-11-03 DIAGNOSIS — R600 Localized edema: Secondary | ICD-10-CM | POA: Diagnosis not present

## 2021-11-03 DIAGNOSIS — R0600 Dyspnea, unspecified: Secondary | ICD-10-CM | POA: Diagnosis not present

## 2021-11-03 DIAGNOSIS — Z20822 Contact with and (suspected) exposure to covid-19: Secondary | ICD-10-CM | POA: Diagnosis not present

## 2021-11-03 DIAGNOSIS — Z853 Personal history of malignant neoplasm of breast: Secondary | ICD-10-CM | POA: Diagnosis not present

## 2021-11-03 DIAGNOSIS — E119 Type 2 diabetes mellitus without complications: Secondary | ICD-10-CM | POA: Insufficient documentation

## 2021-11-03 DIAGNOSIS — Z91040 Latex allergy status: Secondary | ICD-10-CM | POA: Diagnosis not present

## 2021-11-03 DIAGNOSIS — R0789 Other chest pain: Secondary | ICD-10-CM | POA: Insufficient documentation

## 2021-11-03 LAB — BASIC METABOLIC PANEL
Anion gap: 7 (ref 5–15)
BUN: 15 mg/dL (ref 6–20)
CO2: 25 mmol/L (ref 22–32)
Calcium: 9.2 mg/dL (ref 8.9–10.3)
Chloride: 102 mmol/L (ref 98–111)
Creatinine, Ser: 0.87 mg/dL (ref 0.44–1.00)
GFR, Estimated: >60 mL/min (ref >60)
Glucose, Bld: 98 mg/dL (ref 70–99)
Potassium: 3.9 mmol/L (ref 3.5–5.1)
Sodium: 134 mmol/L — ABNORMAL LOW (ref 135–145)

## 2021-11-03 LAB — CBC WITH DIFFERENTIAL/PLATELET
Abs Immature Granulocytes: 0.01 10*3/uL (ref 0.00–0.07)
Basophils Absolute: 0 10*3/uL (ref 0.0–0.1)
Basophils Relative: 0 %
Eosinophils Absolute: 0.1 10*3/uL (ref 0.0–0.5)
Eosinophils Relative: 1 %
HCT: 36.6 % (ref 36.0–46.0)
Hemoglobin: 12.5 g/dL (ref 12.0–15.0)
Immature Granulocytes: 0 %
Lymphocytes Relative: 39 %
Lymphs Abs: 2.9 10*3/uL (ref 0.7–4.0)
MCH: 32.1 pg (ref 26.0–34.0)
MCHC: 34.2 g/dL (ref 30.0–36.0)
MCV: 93.8 fL (ref 80.0–100.0)
Monocytes Absolute: 0.9 10*3/uL (ref 0.1–1.0)
Monocytes Relative: 13 %
Neutro Abs: 3.5 10*3/uL (ref 1.7–7.7)
Neutrophils Relative %: 47 %
Platelets: 313 10*3/uL (ref 150–400)
RBC: 3.9 MIL/uL (ref 3.87–5.11)
RDW: 13.8 % (ref 11.5–15.5)
WBC: 7.4 10*3/uL (ref 4.0–10.5)
nRBC: 0 % (ref 0.0–0.2)

## 2021-11-03 LAB — BRAIN NATRIURETIC PEPTIDE: B Natriuretic Peptide: 23.9 pg/mL (ref 0.0–100.0)

## 2021-11-03 LAB — TROPONIN I (HIGH SENSITIVITY)
Troponin I (High Sensitivity): <2 ng/L (ref <18)
Troponin I (High Sensitivity): 3 ng/L (ref <18)

## 2021-11-03 LAB — SARS CORONAVIRUS 2 BY RT PCR: SARS Coronavirus 2 by RT PCR: NEGATIVE

## 2021-11-03 LAB — D-DIMER, QUANTITATIVE: D-Dimer, Quant: 7.04 ug/mL-FEU — ABNORMAL HIGH (ref 0.00–0.50)

## 2021-11-03 MED ORDER — ALBUTEROL SULFATE HFA 108 (90 BASE) MCG/ACT IN AERS: 2.0000 | INHALATION_SPRAY | RESPIRATORY_TRACT | Status: DC | PRN

## 2021-11-03 MED ORDER — IOHEXOL 350 MG/ML SOLN
100.0000 mL | Freq: Once | INTRAVENOUS | Status: AC | PRN
  Administered 2021-11-03: 100 mL via INTRAVENOUS

## 2021-11-03 NOTE — ED Provider Notes (Signed)
[facility] EMERGENCY DEPARTMENT Provider Note   CSN: [id number] Arrival date & time: 11/03/21  1213     History  Chief Complaint  Patient presents with   Shortness of Breath    Sabrina [name] is a 61 y.o. female.   Shortness of Breath   61 year old female with medical history significant HTN, HLD, bipolar disorder, GERD, morbid obesity, depression, restrictive and obstructive lung disease, DM 2, seizures who presents to the emergency department with chest tightness and shortness of breath.  The patient states that her symptoms came on this morning while at church.  She states that the symptoms have been intermittent throughout the day.  She thought it was due to anxiety but due to persistent symptoms, she decided to come to the emergency department "to get checked out."  She endorses a persistent sensation of tightness in her chest currently with increased shortness of breath, mild dyspnea on exertion.  She endorses a mild productive cough over the past few days.  She denies any sick contacts.  She denies any sharp pleuritic chest discomfort.  She denies any lower extremity swelling that is new.  She states that she has chronic mild bilateral lower extremity swelling.  Home Medications Prior to Admission medications   Medication Sig Start Date End Date Taking? Authorizing Provider  cetirizine (ZYRTEC) 10 MG tablet Take 1 tablet (10 mg total) by mouth daily. 10/18/21 10/18/22  Kennith Gain, MD  albuterol (VENTOLIN HFA) 108 (90 Base) MCG/ACT inhaler Inhale 2 puffs into the lungs every 6 (six) hours as needed for wheezing or shortness of breath. 09/13/20 09/13/21  Kennith Gain, MD  aspirin EC 81 MG tablet Take 1 tablet (81 mg total) by mouth daily. For heart health 12/08/17   Lindell Spar I, NP  atorvastatin (LIPITOR) 10 MG tablet TAKE 1 TABLET (10 MG TOTAL) BY MOUTH DAILY. 05/06/21 05/06/22  Ladell Pier, MD  azelastine (ASTELIN) 0.1 %  nasal spray Place 1-2 sprays into both nostrils 2 (two) times daily. 02/12/21   Althea Charon, FNP  Blood Glucose Monitoring Suppl (TRUE METRIX METER) w/Device KIT Use as directed 03/15/18   Ladell Pier, MD  budesonide-formoterol [facility]) 160-4.5 MCG/ACT inhaler Inhale 2 puffs into the lungs 2 (two) times daily. 2 puffs twice a day with a spacer. Rinse mouth after use. 02/11/21   Althea Charon, FNP  cetirizine (ZYRTEC) 10 MG tablet Take 1 tablet (10 mg total) by mouth 2 (two) times daily. 08/01/21 08/01/22  Kennith Gain, MD  divalproex (DEPAKOTE ER) 250 MG 24 hr tablet Take 3 tablets (750 mg total) by mouth at bedtime. For mood stabilization 12/08/17   Nwoko, Herbert Pun I, NP  fluticasone (FLONASE) 50 MCG/ACT nasal spray Place 2 sprays into both nostrils daily. 06/03/21   Rodriguez-Southworth, Sunday Spillers, PA-C  glucose blood (TRUE METRIX BLOOD GLUCOSE TEST) test strip Use as instructed 03/15/18   Ladell Pier, MD  hydrochlorothiazide (HYDRODIURIL) 25 MG tablet TAKE 1 TABLET(25 MG) BY MOUTH DAILY 10/03/21   Ladell Pier, MD  ibuprofen (ADVIL) 600 MG tablet Take 1 tablet (600 mg total) by mouth every 8 (eight) hours as needed. 07/23/21   Mayers, Cari S, PA-C  metFORMIN (GLUCOPHAGE) 500 MG tablet TAKE 1 TABLET (500 MG TOTAL) BY MOUTH DAILY WITH BREAKFAST. FOR DIABETES MANANGEMENT 08/08/21   Ladell Pier, MD  omeprazole (PRILOSEC) 20 MG capsule TAKE 1 CAPSULE (20 MG TOTAL) BY MOUTH DAILY. 04/04/21   Kennith Gain, MD  solifenacin (  VESICARE) 10 MG tablet Take 1 tablet (10 mg total) by mouth daily. 07/24/21   Jaquita Folds, MD  Spacer/Aero-Holding Dorise Bullion Use Spacer with Albuterol inhaler as needed 08/11/19   Katy Apo, NP  TRUEPLUS LANCETS 28G MISC Use as directed 03/15/18   Ladell Pier, MD      Allergies    Benzonatate, Latex, Losartan, Penicillins, Lisinopril, and Oxycodone-acetaminophen    Review of Systems   Review of Systems   Respiratory:  Positive for shortness of breath.   All other systems reviewed and are negative.   Physical Exam Updated Vital Signs BP (!) 176/87   Pulse 62   Temp 99.1 F (37.3 C) (Oral)   Resp 15   Ht _0  (1.626 m)   Wt 122.9 kg   SpO2 100%   BMI 46.52 kg/m  Physical Exam Vitals and nursing note reviewed.  Constitutional:      General: She is not in acute distress.    Appearance: She is well-developed.  HENT:     Head: Normocephalic and atraumatic.  Eyes:     Conjunctiva/sclera: Conjunctivae normal.  Cardiovascular:     Rate and Rhythm: Normal rate and regular rhythm.     Heart sounds: No murmur heard. Pulmonary:     Effort: Pulmonary effort is normal. No respiratory distress.     Breath sounds: Examination of the right-upper field reveals decreased breath sounds. Examination of the left-upper field reveals decreased breath sounds. Examination of the right-middle field reveals decreased breath sounds. Examination of the left-middle field reveals decreased breath sounds. Examination of the right-lower field reveals decreased breath sounds. Examination of the left-lower field reveals decreased breath sounds. Decreased breath sounds present.  Abdominal:     Palpations: Abdomen is soft.     Tenderness: There is no abdominal tenderness.  Musculoskeletal:        General: No swelling.     Cervical back: Neck supple.     Right lower leg: Edema present.     Left lower leg: Edema present.     Comments: Bilateral lower extremity edema slightly asymmetric right greater than left, patient states his  Skin:    General: Skin is warm and dry.     Capillary Refill: Capillary refill takes less than 2 seconds.  Neurological:     Mental Status: She is alert.  Psychiatric:        Mood and Affect: Mood normal.     ED Results / Procedures / Treatments   Labs (all labs ordered are listed, but only abnormal results are displayed) Labs Reviewed  BASIC METABOLIC PANEL - Abnormal;  Notable for the following components:      Result Value   Sodium 134 (*)    All other components within normal limits  SARS CORONAVIRUS 2 BY RT PCR  CBC WITH DIFFERENTIAL/PLATELET  BRAIN NATRIURETIC PEPTIDE  D-DIMER, QUANTITATIVE  TROPONIN I (HIGH SENSITIVITY)  TROPONIN I (HIGH SENSITIVITY)    EKG EKG Interpretation  Date/Time:  Sunday November 03 2021 12:54:42 EDT Ventricular Rate:  71 PR Interval:  138 QRS Duration: 72 QT Interval:  396 QTC Calculation: 430 R Axis:   47 Text Interpretation: Normal sinus rhythm Nonspecific T wave abnormality Abnormal ECG When compared with ECG of 03-Oct-2019 05:12, PREVIOUS ECG IS PRESENT Confirmed by Regan Lemming ([id number]) on 11/03/2021 2:34:50 PM  Radiology DG Chest 2 View  Result Date: 11/03/2021 CLINICAL DATA:  Shortness of breath. EXAM: CHEST - 2 VIEW COMPARISON:  May 13, 2021  FINDINGS: Chronic elevation of the left hemidiaphragm. Cardiomediastinal silhouette is normal. Mediastinal contours appear intact. There is no evidence of focal airspace consolidation, pleural effusion or pneumothorax. Low lung volumes. Osseous structures are without acute abnormality. Soft tissues are grossly normal. IMPRESSION: No active cardiopulmonary disease. Electronically Signed   By: Fidela Salisbury M.D.   On: 11/03/2021 13:29    Procedures Ultrasound ED Peripheral IV (Provider)  Date/Time: 11/03/2021 3:45 PM  Performed by: Regan Lemming, MD Authorized by: Regan Lemming, MD   Procedure details:    Indications: multiple failed IV attempts     Skin Prep: chlorhexidine gluconate     Location:  Left AC   Angiocath:  20 G   Bedside Ultrasound Guided: Yes     Images: not archived     Patient tolerated procedure without complications: Yes     Dressing applied: Yes       Medications Ordered in ED Medications  albuterol (VENTOLIN HFA) 108 (90 Base) MCG/ACT inhaler 2 puff (has no administration in time range)    ED Course/ Medical Decision Making/  A&P Clinical Course as of 11/03/21 1546  Sun Nov 03, 2021  1510 60 with pmh obesity, breast cancer, bipolar with CP and difficulty breathing. Mild productive cough. CXR unremarkable. Initial hs trop not detected and reassuring and repeat pending. D dimer pending. Likely discharge with cardiology referral. [VB]    Clinical Course User Index [VB] Elgie Congo, MD                           Medical Decision Making Amount and/or Complexity of Data Reviewed Labs: ordered. Radiology: ordered.  Risk Prescription drug management.   61 year old female with medical history significant HTN, HLD, bipolar disorder, GERD, morbid obesity, depression, restrictive and obstructive lung disease, DM 2, seizures who presents to the emergency department with chest tightness and shortness of breath.  The patient states that her symptoms came on this morning while at church.  She states that the symptoms have been intermittent throughout the day.  She thought it was due to anxiety but due to persistent symptoms, she decided to come to the emergency department "to get checked out."  She endorses a persistent sensation of tightness in her chest currently with increased shortness of breath, mild dyspnea on exertion.  She endorses a mild productive cough over the past few days.  She denies any sick contacts.  She denies any sharp pleuritic chest discomfort.  She denies any lower extremity swelling that is new.  She states that she has chronic mild bilateral lower extremity swelling.  On arrival, the patient was vitally stable.  Temperature nine 9.1, not tachycardic or tachypneic, hemodynamically stable, saturating 98% on room air.  Sinus rhythm noted on cardiac telemetry.  Physical exam significant for mild bilateral lower extremity edema trace, slightly asymmetric although patient states that this is chronic and not new.  She has no JVD on exam.  She has lungs with decreased and diminished breath sounds bilaterally  in all lung fields, likely due to obesity and body habitus, no wheezes rales or rhonchi.  Differential diagnosis includes ACS, PE, pneumonia, viral URI, obesity hypoventilation, anemia, undiagnosed CHF, COVID-19, anxiety.   Initial EKG was unremarkable with normal sinus rhythm, ventricular rate 71, nonspecific T wave changes present, no STEMI.  A chest x-ray was performed, reviewed and interpreted by myself in addition to radiology which revealed no active cardiopulmonary disease.  Of note, the patient last  underwent left heart catheterization in August 2016 with no CHF noted on ultrasound with an EF of 55 to 65% and no CAD noted.  Patient laboratory evaluation significant for initial BNP normal, troponin negative less than 2, CBC without leukocytosis or anemia, BMP unremarkable.  Plan at time of signout to follow-up with the patient's D-dimer, second delta troponin, COVID-19 PCR testing and reassess the patient with a plan for likely discharge if laboratory evaluation returns unremarkable.  Signout given to Dr. Nechama Guard at [phone].  Final Clinical Impression(s) / ED Diagnoses Final diagnoses:  Dyspnea, unspecified type  Chest tightness    Rx / DC Orders ED Discharge Orders     None         Regan Lemming, MD 11/03/21 1547

## 2021-11-03 NOTE — ED Provider Notes (Signed)
  Physical Exam  BP (!) 148/109   Pulse 60   Temp 99.1 F (37.3 C) (Oral)   Resp 11   Ht '5\' 4"'$  (1.626 m)   Wt 122.9 kg   SpO2 100%   BMI 46.52 kg/m   Physical Exam Resting comfortably nontoxic no acute distress No increased work of breathing satting 100% on room air Reg rate and rhythm warm well perfused Procedures  Procedures  ED Course / MDM   Clinical Course as of 11/03/21 1746  Sun Nov 03, 2021  1510 60 with pmh obesity, breast cancer, bipolar with CP and difficulty breathing. Mild productive cough. CXR unremarkable. Initial hs trop not detected and reassuring and repeat pending. D dimer pending. Likely discharge with cardiology referral. [VB]  1616 EKG with nonspecific T wave change, inversion in lead III and flattening in V6.  Patient's repeat troponin in process.  D-dimer elevated 7.04, ordered for CTA chest PE study. [VB]  5597 Repeat high-sensitivity troponin 3 and reassuring.  No active chest pain.  Her CTA chest was negative for PE or other acute abnormalities.  Discharge patient with strict return precautions and referral made to cardiology.  All questions answered.  Patient and husband in agreement with plan. [VB]    Clinical Course User Index [VB] Elgie Congo, MD   Medical Decision Making Amount and/or Complexity of Data Reviewed Labs: ordered. Radiology: ordered.  Risk Prescription drug management.          Elgie Congo, MD 11/03/21 [id number]

## 2021-11-03 NOTE — ED Notes (Signed)
Patient wanted to go to bathroom prior to having ekg and vitals completed

## 2021-11-03 NOTE — Discharge Instructions (Addendum)
Please follow-up with your PCP in addition to cardiology outpatient for further evaluation.  Your work-up today was unremarkable with reassuring chest x-ray and laboratory evaluation.  Your CT scans showed no evidence of blood clot.  We have referred you to cardiology for outpatient follow-up.  Come back if any severe worsening chest pain, increasing shortness of breath, fainting episodes, or any other symptoms concerning to you.

## 2021-11-03 NOTE — ED Triage Notes (Signed)
Patient complains of increased sob.  Complains tightness in chest.

## 2021-11-06 ENCOUNTER — Encounter (INDEPENDENT_AMBULATORY_CARE_PROVIDER_SITE_OTHER): Payer: Self-pay

## 2021-11-06 LAB — GLUCOSE, POCT (MANUAL RESULT ENTRY): POC Glucose: 155 mg/dl — AB (ref 70–99)

## 2021-11-07 ENCOUNTER — Other Ambulatory Visit: Payer: Self-pay

## 2021-11-07 ENCOUNTER — Encounter: Payer: Self-pay | Admitting: Physical Therapy

## 2021-11-07 ENCOUNTER — Encounter: Payer: Medicaid Other | Attending: Obstetrics and Gynecology | Admitting: Physical Therapy

## 2021-11-07 DIAGNOSIS — M6281 Muscle weakness (generalized): Secondary | ICD-10-CM | POA: Diagnosis present

## 2021-11-07 DIAGNOSIS — R293 Abnormal posture: Secondary | ICD-10-CM | POA: Insufficient documentation

## 2021-11-07 DIAGNOSIS — R279 Unspecified lack of coordination: Secondary | ICD-10-CM | POA: Insufficient documentation
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
## 2021-11-08 ENCOUNTER — Other Ambulatory Visit: Payer: Self-pay

## 2021-11-14 ENCOUNTER — Other Ambulatory Visit: Payer: Self-pay

## 2021-11-15 ENCOUNTER — Other Ambulatory Visit: Payer: Self-pay

## 2021-11-29 ENCOUNTER — Other Ambulatory Visit (HOSPITAL_COMMUNITY): Payer: Self-pay

## 2021-11-29 ENCOUNTER — Other Ambulatory Visit: Payer: Self-pay

## 2021-11-30 ENCOUNTER — Other Ambulatory Visit (HOSPITAL_COMMUNITY): Payer: Self-pay

## 2021-12-03 ENCOUNTER — Other Ambulatory Visit (HOSPITAL_COMMUNITY): Payer: Self-pay

## 2021-12-04 ENCOUNTER — Ambulatory Visit: Payer: Medicaid Other | Admitting: Allergy

## 2021-12-05 ENCOUNTER — Other Ambulatory Visit: Payer: Self-pay

## 2021-12-06 ENCOUNTER — Other Ambulatory Visit: Payer: Self-pay

## 2021-12-13 ENCOUNTER — Ambulatory Visit: Payer: Medicaid Other | Admitting: Allergy

## 2021-12-13 ENCOUNTER — Encounter: Payer: Self-pay | Admitting: Allergy

## 2021-12-13 ENCOUNTER — Ambulatory Visit (INDEPENDENT_AMBULATORY_CARE_PROVIDER_SITE_OTHER): Payer: Medicaid Other | Admitting: Allergy

## 2021-12-13 VITALS — BP 122/78 | HR 87 | Temp 97.6°F | Resp 16 | Wt 275.4 lb

## 2021-12-13 DIAGNOSIS — J45991 Cough variant asthma: Secondary | ICD-10-CM | POA: Diagnosis not present

## 2021-12-13 DIAGNOSIS — H1013 Acute atopic conjunctivitis, bilateral: Secondary | ICD-10-CM

## 2021-12-13 DIAGNOSIS — J3089 Other allergic rhinitis: Secondary | ICD-10-CM

## 2021-12-13 DIAGNOSIS — K219 Gastro-esophageal reflux disease without esophagitis: Secondary | ICD-10-CM

## 2021-12-13 MED ORDER — OMEPRAZOLE 20 MG PO CPDR: DELAYED_RELEASE_CAPSULE | Freq: Every day | ORAL | 5 refills | Status: DC

## 2021-12-13 MED ORDER — ALBUTEROL SULFATE HFA 108 (90 BASE) MCG/ACT IN AERS
2.0000 | INHALATION_SPRAY | Freq: Four times a day (QID) | RESPIRATORY_TRACT | 1 refills | Status: DC | PRN
Start: 2021-12-13 — End: 2022-06-25

## 2021-12-13 MED ORDER — AZELASTINE HCL 0.1 % NA SOLN: 1.0000 | Freq: Two times a day (BID) | NASAL | 5 refills | Status: DC

## 2021-12-13 MED ORDER — FLUTICASONE PROPIONATE 50 MCG/ACT NA SUSP
2.0000 | Freq: Every day | NASAL | 5 refills | Status: DC
Start: 2021-12-13 — End: 2022-06-25

## 2021-12-13 MED ORDER — BUDESONIDE-FORMOTEROL FUMARATE 160-4.5 MCG/ACT IN AERO
2.0000 | INHALATION_SPRAY | Freq: Two times a day (BID) | RESPIRATORY_TRACT | 5 refills | Status: DC
Start: 2021-12-13 — End: 2022-06-25

## 2021-12-13 NOTE — Patient Instructions (Addendum)
Allergic rhinitis with conjunctivitis - continue avoidance measures for grass pollen, weed pollen, tree pollen, dust mite, cockroach, cat, and dog.  - Use azelastine (Astelin) 2 sprays each nostril twice daily as needed for runny nose/nasal drainage. - Use saline rinse kit prior to nasal spray use.  Use distilled water or boil water and bring to room temperature (do not use tap water).  - Use fluticasone nasal spray 2 sprays each nostril once a day as needed for stuffy nose - Can take Zyrtec 1 tab twice a day as needed  Cough variant asthma - have access to albuterol inhaler 2 puffs via spacer or albuterol 1 vial via nebulizer every 4-6 hours as needed for cough/wheeze/shortness of breath/chest tightness.  May use 15-20 minutes prior to activity.   Monitor frequency of use.   - use pump inhaler with spacer device (plastic tube).   - use Symbicort 160/4.5 mcg 2 puffs twice a day with spacer. Use this every day for maintenance control of symptom. Set a reminder on your phone or set it next to your tooth brush as a reminder   Asthma control goals:  Full participation in all desired activities (may need albuterol before activity) Albuterol use two time or less a week on average (not counting use with activity) Cough interfering with sleep two time or less a month Oral steroids no more than once a year No hospitalizations  -concerned that continued dry cough could be due to Losartan use which is an ARB type medication and this group of medications can cause cough.  Would discuss with Dr. Wynetta Emery regarding alterative to Losartan  GERD - can use omeprazole daily for reflux symptom control  Itching - Continue to avoid foods that cause itching - Blood work to tomato and lemon were positive. Recommend avoiding these foods along with other foods that cause itching/ rash and see if this helps. Have access to Epipen 0.3 mg in case of allergic reaction - Testing to orange was negative.  Follow-up in 6  months or sooner if needed

## 2021-12-13 NOTE — Progress Notes (Signed)
Follow-up Note  RE: Sabrina [name] MRN: [id number] DOB: [birth year] Date of Office Visit: 12/13/2021   History of present illness: Sabrina [name] is a 61 y.o. female presenting today for follow-up of allergic rhinitis with conjunctivitis, asthma, gerd and adverse food reaction.  She was last seen in the office on 08/01/21 by myself.   She states she had been walking more on her job and she also does not have a car at this time so having a walk more to get to the bus.  She states she is trying to eat in moderation.  She would like to lose some weight.   She states she still has some coughing.  She also states she had some chest tightness last week.  She did not think to use her albuterol inhaler.  She states she does know where her albuterol is.  She has symbicort inhaler.  She has not had any ED/UC visits or any systemic steroid needs since last visit fortunately.  She states she has noted a crack in her ceiling and states there is "urine-y" smell to it.  She has told her landlord about this issue.  She is not sure if there is mold or not. She did run out of cetirizine and did have some itching thus she got it filled and is back on.  She also states she has had some tomato products in diet and this is a food that had cuased itching and has positive IgE for.  She does try her best to avoid lemon products.  She has an epipen.   At this time not reporting any nasal or ocular symptoms.  She does have access to asteline and flonase nasal sprays for as needed use.  She will use omeprazaole for reflux control.    Review of systems: Review of Systems  Constitutional: Negative.   HENT: Negative.    Eyes: Negative.   Respiratory:  Positive for cough and chest tightness.   Cardiovascular: Negative.   Gastrointestinal: Negative.   Musculoskeletal: Negative.   Skin: Negative.   Allergic/Immunologic: Negative.   Neurological: Negative.      All other systems negative unless noted  above in HPI  Past medical/social/surgical/family history have been reviewed and are unchanged unless specifically indicated below.  No changes  Medication List: Current Outpatient Medications  Medication Sig Dispense Refill  . albuterol (VENTOLIN HFA) 108 (90 Base) MCG/ACT inhaler Inhale 2 puffs into the lungs every 6 (six) hours as needed for wheezing or shortness of breath.    Marland Kitchen aspirin EC 81 MG tablet Take 1 tablet (81 mg total) by mouth daily. For heart health    . atorvastatin (LIPITOR) 10 MG tablet TAKE 1 TABLET (10 MG TOTAL) BY MOUTH DAILY. 30 tablet 3  . azelastine (ASTELIN) 0.1 % nasal spray Place 1-2 sprays into both nostrils 2 (two) times daily. 30 mL 5  . Blood Glucose Monitoring Suppl (TRUE METRIX METER) w/Device KIT Use as directed 1 kit 0  . budesonide-formoterol (SYMBICORT) 160-4.5 MCG/ACT inhaler Inhale 2 puffs into the lungs 2 (two) times daily. 2 puffs twice a day with a spacer. Rinse mouth after use. 10.2 g 5  . cetirizine (ZYRTEC) 10 MG tablet Take 1 tablet (10 mg total) by mouth 2 (two) times daily. 60 tablet 5  . cetirizine (ZYRTEC) 10 MG tablet Take 1 tablet (10 mg total) by mouth daily. 30 tablet 3  . divalproex (DEPAKOTE ER) 250 MG 24 hr tablet Take 3 tablets (750  mg total) by mouth at bedtime. For mood stabilization 90 tablet 0  . fluticasone (FLONASE) 50 MCG/ACT nasal spray Place 2 sprays into both nostrils daily. 18.2 g 0  . glucose blood (TRUE METRIX BLOOD GLUCOSE TEST) test strip Use as instructed 100 each 12  . hydrochlorothiazide (HYDRODIURIL) 25 MG tablet TAKE 1 TABLET(25 MG) BY MOUTH DAILY 90 tablet 0  . ibuprofen (ADVIL) 600 MG tablet Take 1 tablet (600 mg total) by mouth every 8 (eight) hours as needed. 30 tablet 0  . metFORMIN (GLUCOPHAGE) 500 MG tablet TAKE 1 TABLET (500 MG TOTAL) BY MOUTH DAILY WITH BREAKFAST. FOR DIABETES MANANGEMENT 90 tablet 3  . omeprazole (PRILOSEC) 20 MG capsule TAKE 1 CAPSULE (20 MG TOTAL) BY MOUTH DAILY. 30 capsule 5  .  solifenacin (VESICARE) 10 MG tablet Take 1 tablet (10 mg total) by mouth daily. 90 tablet 1  . Spacer/Aero-Holding Chambers DEVI Use Spacer with Albuterol inhaler as needed 1 Container 0  . TRUEPLUS LANCETS 28G MISC Use as directed 100 each 6   No current facility-administered medications for this visit.     Known medication allergies: Allergies  Allergen Reactions  . Benzonatate Other (See Comments)    Made her cough worse  . Latex Dermatitis and Other (See Comments)    Hands became scaly  . Losartan Cough  . Penicillins Other (See Comments)    Did it involve swelling of the face/tongue/throat, SOB, or low BP? Unk Did it involve sudden or severe rash/hives, skin peeling, or any reaction on the inside of your mouth or nose? Unk Did you need to seek medical attention at a hospital or doctor's office? Unk When did it last happen? "I was little; I don't remember, but my parents told me to never take it."  If all above answers are "NO", may proceed with cephalosporin use.    Marland Kitchen Lisinopril Cough  . Oxycodone-Acetaminophen Nausea And Vomiting    Pt thinks she may be able to take with food (??)     Physical examination: Blood pressure 122/78, pulse 87, temperature 97.6 F (36.4 C), temperature source Temporal, resp. rate 16, weight 275 lb 6.4 oz (124.9 kg), SpO2 97 %.  General: Alert, interactive, in no acute distress. HEENT: PERRLA, TMs pearly gray, turbinates non-edematous without discharge, post-pharynx non erythematous. Neck: Supple without lymphadenopathy. Lungs: Clear to auscultation without wheezing, rhonchi or rales. {no increased work of breathing. CV: Normal S1, S2 without murmurs. Abdomen: Nondistended, nontender. Skin: Warm and dry, without lesions or rashes. Extremities:  No clubbing, cyanosis or edema. Neuro:   Grossly intact.  Diagnositics/Labs:  Spirometry: FEV1: 1.6L 74%, FVC: 2.0L 74%, ratio consistent with nonobstructive pattern  Assessment and  plan: Allergic rhinitis with conjunctivitis - continue avoidance measures for grass pollen, weed pollen, tree pollen, dust mite, cockroach, cat, and dog.  - Use azelastine (Astelin) 2 sprays each nostril twice daily as needed for runny nose/nasal drainage. - Use saline rinse kit prior to nasal spray use.  Use distilled water or boil water and bring to room temperature (do not use tap water).  - Use fluticasone nasal spray 2 sprays each nostril once a day as needed for stuffy nose - Can take Zyrtec 1 tab twice a day as needed  Cough variant asthma - have access to albuterol inhaler 2 puffs via spacer or albuterol 1 vial via nebulizer every 4-6 hours as needed for cough/wheeze/shortness of breath/chest tightness.  May use 15-20 minutes prior to activity.   Monitor frequency of use.   -  use pump inhaler with spacer device (plastic tube).   - use Symbicort 160/4.5 mcg 2 puffs twice a day with spacer. Use this every day for maintenance control of symptom. Set a reminder on your phone or set it next to your tooth brush as a reminder   Asthma control goals:  Full participation in all desired activities (may need albuterol before activity) Albuterol use two time or less a week on average (not counting use with activity) Cough interfering with sleep two time or less a month Oral steroids no more than once a year No hospitalizations  -concerned that continued dry cough could be due to Losartan use which is an ARB type medication and this group of medications can cause cough.  Would discuss with Dr. Wynetta Emery regarding alterative to Losartan  GERD - can use omeprazole daily for reflux symptom control  Itching - Continue to avoid foods that cause itching - Blood work to tomato and lemon were positive. Recommend avoiding these foods along with other foods that cause itching/ rash and see if this helps. Have access to Epipen 0.3 mg in case of allergic reaction - Testing to orange was  negative.  Follow-up in 6 months or sooner if needed  I appreciate the opportunity to take part in Yvana's care. Please do not hesitate to contact me with questions.  Sincerely,   Prudy Feeler, MD Allergy/Immunology [facility] [place]

## 2021-12-18 ENCOUNTER — Encounter: Payer: Self-pay | Admitting: Cardiology
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
## 2021-12-19 ENCOUNTER — Ambulatory Visit: Payer: Medicaid Other | Admitting: Cardiology

## 2021-12-19 ENCOUNTER — Ambulatory Visit: Payer: Medicaid Other | Admitting: Cardiovascular Disease

## 2021-12-19 DIAGNOSIS — R072 Precordial pain: Secondary | ICD-10-CM

## 2021-12-23 ENCOUNTER — Other Ambulatory Visit: Payer: Self-pay

## 2021-12-23 ENCOUNTER — Telehealth: Payer: Self-pay | Admitting: Allergy

## 2021-12-23 DIAGNOSIS — J45991 Cough variant asthma: Secondary | ICD-10-CM

## 2021-12-23 NOTE — Telephone Encounter (Signed)
Pt is requesting a prescription for cetirizine for 2x daily per her discussion with Dr. Nelva Bush at last [place].

## 2021-12-23 NOTE — Telephone Encounter (Signed)
Is it ok to change sig on rx to bid?

## 2021-12-24 MED ORDER — CETIRIZINE HCL 10 MG PO TABS: 10.0000 mg | ORAL_TABLET | Freq: Two times a day (BID) | ORAL | 3 refills | Status: DC

## 2021-12-24 NOTE — Telephone Encounter (Signed)
Sent in new script for bid with quantity 60 and 3 refills

## 2021-12-27 ENCOUNTER — Other Ambulatory Visit: Payer: Self-pay | Admitting: Allergy

## 2021-12-27 ENCOUNTER — Other Ambulatory Visit: Payer: Self-pay

## 2021-12-27 DIAGNOSIS — J45991 Cough variant asthma: Secondary | ICD-10-CM

## 2021-12-27 MED ORDER — CETIRIZINE HCL 10 MG PO TABS
10.0000 mg | ORAL_TABLET | Freq: Every day | ORAL | 4 refills | Status: DC
  Filled 2021-12-27: qty 30, 30d supply, fill #0

## 2022-01-01 ENCOUNTER — Other Ambulatory Visit: Payer: Self-pay

## 2022-01-01 ENCOUNTER — Other Ambulatory Visit (HOSPITAL_COMMUNITY): Payer: Self-pay

## 2022-01-05 ENCOUNTER — Encounter: Payer: Self-pay | Admitting: Cardiovascular Disease
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
## 2022-01-07 ENCOUNTER — Ambulatory Visit: Payer: Medicaid Other | Attending: Cardiovascular Disease | Admitting: Cardiovascular Disease

## 2022-01-07 ENCOUNTER — Encounter: Payer: Self-pay | Admitting: Cardiovascular Disease

## 2022-01-07 ENCOUNTER — Other Ambulatory Visit: Payer: Self-pay

## 2022-01-07 VITALS — BP 130/70 | HR 72 | Ht 64.0 in | Wt 273.6 lb

## 2022-01-07 DIAGNOSIS — I152 Hypertension secondary to endocrine disorders: Secondary | ICD-10-CM | POA: Diagnosis present

## 2022-01-07 DIAGNOSIS — E1159 Type 2 diabetes mellitus with other circulatory complications: Secondary | ICD-10-CM | POA: Diagnosis present

## 2022-01-07 DIAGNOSIS — Z01812 Encounter for preprocedural laboratory examination: Secondary | ICD-10-CM | POA: Diagnosis present

## 2022-01-07 DIAGNOSIS — R072 Precordial pain: Secondary | ICD-10-CM | POA: Insufficient documentation

## 2022-01-07 LAB — BASIC METABOLIC PANEL
BUN/Creatinine Ratio: 16 (ref 12–28)
BUN: 15 mg/dL (ref 8–27)
CO2: 27 mmol/L (ref 20–29)
Calcium: 9.8 mg/dL (ref 8.7–10.3)
Chloride: 98 mmol/L (ref 96–106)
Creatinine, Ser: 0.96 mg/dL (ref 0.57–1.00)
Glucose: 91 mg/dL (ref 70–99)
Potassium: 4.5 mmol/L (ref 3.5–5.2)
Sodium: 137 mmol/L (ref 134–144)
eGFR: 68 mL/min/{1.73_m2} (ref >59)

## 2022-01-07 MED ORDER — METOPROLOL TARTRATE 100 MG PO TABS
100.0000 mg | ORAL_TABLET | Freq: Once | ORAL | 0 refills | Status: DC
  Filled 2022-01-07: qty 1, 1d supply, fill #0

## 2022-01-07 NOTE — Patient Instructions (Addendum)
Medication Instructions:  NO CHANGES *If you need a refill on your cardiac medications before your next appointment, please call your pharmacy*   Lab Work: BMET If you have labs (blood work) drawn today and your tests are completely normal, you will receive your results only by: [place] (if you have MyChart) OR A paper copy in the mail If you have any lab test that is abnormal or we need to change your treatment, we will call you to review the results.   Testing/Procedures:   Your cardiac CT will be scheduled at one of the below locations:   [facility] [address] [place], [place] [address] [phone]  [place] [facility] [address] [place], [place] [address] [phone]   If scheduled at [facility], please arrive at the [facility]'s Entrance (Entrance C2) of [facility] 30 minutes prior to test start time. You can use the FREE valet parking offered at entrance C (encouraged to control the heart rate for the test)  Proceed to the [facility] Radiology Department (first floor) to check-in and test prep.  All radiology patients and guests should use entrance C2 at [facility], accessed from [facility], even though the hospital's physical address listed is [address].    Please follow these instructions carefully (unless otherwise directed):   On the Night Before the Test: Be sure to Drink plenty of water. Do not consume any caffeinated/decaffeinated beverages or chocolate 12 hours prior to your test. Do not take any antihistamines 12 hours prior to your test. I  On the Day of the Test: Drink plenty of water until 1 hour prior to the test. Do not eat any food 1 hour prior to test. You may take your regular medications prior to the test.  Take metoprolol (Lopressor) two hours prior to test. HOLD Hydrochlorothiazide morning of the  test. FEMALES- please wear underwire-free bra if available, avoid dresses & tight clothing  Metoprolol tartrate 50 mg PO 90-120 minutes prior to scan   Resting HR > 65 bpm and BP >110/50 mmHG  Metoprolol tartrate 100 mg PO 90-120 minutes prior to scan       After the Test: Drink plenty of water. After receiving IV contrast, you may experience a mild flushed feeling. This is normal. On occasion, you may experience a mild rash up to 24 hours after the test. This is not dangerous. If this occurs, you can take Benadryl 25 mg and increase your fluid intake. If you experience trouble breathing, this can be serious. If it is severe call 911 IMMEDIATELY. If it is mild, please call our office. If you take any of these medications: Glipizide/Metformin, Avandament, Glucavance, please do not take 48 hours after completing test unless otherwise instructed.  We will call to schedule your test 2-4 weeks out understanding that some insurance companies will need an authorization prior to the service being performed.   For non-scheduling related questions, please contact the cardiac imaging nurse navigator should you have any questions/concerns: Marchia Bond, Cardiac Imaging Nurse Navigator Gordy Clement, Cardiac Imaging Nurse Navigator [facility] Direct Office Dial: [phone]   For scheduling needs, including cancellations and rescheduling, please call [place], [phone].    Follow-Up: At [facility], you and your health needs are our priority.  As part of our continuing mission to provide you with exceptional heart care, we have created designated Provider  Care Teams.  These Care Teams include your primary Cardiologist (physician) and Advanced Practice Providers (APPs -  Physician Assistants and Nurse Practitioners) who all work together to provide you with the care you need, when you need it.  We recommend signing up for the patient portal called "MyChart".   Sign up information is provided on this After Visit Summary.  MyChart is used to connect with patients for Virtual Visits (Telemedicine).  Patients are able to view lab/test results, encounter notes, upcoming appointments, etc.  Non-urgent messages can be sent to your provider as well.   To learn more about what you can do with MyChart, go to [email or web address].    Your next appointment:   AS NEEDED  The format for your next appointment:     Provider:   s Important Information About [place]

## 2022-01-15 LAB — GLUCOSE, POCT (MANUAL RESULT ENTRY): POC Glucose: 80 mg/dl (ref 70–99)

## 2022-01-20 ENCOUNTER — Encounter: Payer: Self-pay | Admitting: *Deleted

## 2022-01-22 ENCOUNTER — Telehealth (HOSPITAL_COMMUNITY): Payer: Self-pay | Admitting: *Deleted

## 2022-01-22 NOTE — Telephone Encounter (Signed)
Reaching out to patient to offer assistance regarding upcoming cardiac imaging study; pt verbalizes understanding of appt date/time, parking situation and where to check in, medications ordered, and verified current allergies; name and call back number provided for further questions should they arise  Deztiny Sarra RN Navigator Cardiac Imaging [facility] [phone] office [phone] cell  Patient to take 100mg metoprolol tartrate two hours prior to her cardiac CT scan.  She is aware to arrive at 8:30am. 

## 2022-01-23 ENCOUNTER — Ambulatory Visit (HOSPITAL_COMMUNITY)
Admission: RE | Admit: 2022-01-23 | Discharge: 2022-01-23 | Disposition: A | Discharge status: Home / Self Care | Payer: Medicaid Other | Source: Ambulatory Visit | Attending: Cardiovascular Disease | Admitting: Cardiovascular Disease

## 2022-01-23 DIAGNOSIS — R072 Precordial pain: Secondary | ICD-10-CM | POA: Diagnosis not present

## 2022-01-23 MED ORDER — NITROGLYCERIN 0.4 MG SL SUBL
0.8000 mg | SUBLINGUAL_TABLET | Freq: Once | SUBLINGUAL | Status: AC
  Administered 2022-01-23: 0.8 mg via SUBLINGUAL

## 2022-01-23 MED ORDER — NITROGLYCERIN 0.4 MG SL SUBL
SUBLINGUAL_TABLET | SUBLINGUAL | Status: AC
  Filled 2022-01-23: qty 2

## 2022-01-23 MED ORDER — IOHEXOL 350 MG/ML SOLN
100.0000 mL | Freq: Once | INTRAVENOUS | Status: AC | PRN
  Administered 2022-01-23: 100 mL via INTRAVENOUS

## 2022-01-28 ENCOUNTER — Other Ambulatory Visit: Payer: Self-pay | Admitting: Internal Medicine

## 2022-01-28 DIAGNOSIS — I10 Essential (primary) hypertension: Secondary | ICD-10-CM

## 2022-02-04 ENCOUNTER — Ambulatory Visit: Payer: Medicaid Other | Admitting: Internal Medicine

## 2022-02-06 ENCOUNTER — Other Ambulatory Visit: Payer: Self-pay

## 2022-02-06 ENCOUNTER — Ambulatory Visit: Payer: Medicaid Other | Attending: Internal Medicine | Admitting: Internal Medicine

## 2022-02-06 DIAGNOSIS — N3941 Urge incontinence: Secondary | ICD-10-CM | POA: Diagnosis not present

## 2022-02-06 DIAGNOSIS — M25511 Pain in right shoulder: Secondary | ICD-10-CM | POA: Insufficient documentation

## 2022-02-06 DIAGNOSIS — M659 Synovitis and tenosynovitis, unspecified: Secondary | ICD-10-CM | POA: Insufficient documentation

## 2022-02-06 DIAGNOSIS — I10 Essential (primary) hypertension: Secondary | ICD-10-CM | POA: Diagnosis not present

## 2022-02-06 DIAGNOSIS — M069 Rheumatoid arthritis, unspecified: Secondary | ICD-10-CM | POA: Diagnosis not present

## 2022-02-06 DIAGNOSIS — M25532 Pain in left wrist: Secondary | ICD-10-CM | POA: Insufficient documentation

## 2022-02-06 DIAGNOSIS — J45909 Unspecified asthma, uncomplicated: Secondary | ICD-10-CM | POA: Diagnosis not present

## 2022-02-06 DIAGNOSIS — Z2911 Encounter for prophylactic immunotherapy for respiratory syncytial virus (RSV): Secondary | ICD-10-CM | POA: Diagnosis not present

## 2022-02-06 DIAGNOSIS — Z79899 Other long term (current) drug therapy: Secondary | ICD-10-CM | POA: Insufficient documentation

## 2022-02-06 DIAGNOSIS — E1159 Type 2 diabetes mellitus with other circulatory complications: Secondary | ICD-10-CM | POA: Insufficient documentation

## 2022-02-06 DIAGNOSIS — Z23 Encounter for immunization: Secondary | ICD-10-CM | POA: Diagnosis not present

## 2022-02-06 DIAGNOSIS — Z7984 Long term (current) use of oral hypoglycemic drugs: Secondary | ICD-10-CM | POA: Insufficient documentation

## 2022-02-06 DIAGNOSIS — R911 Solitary pulmonary nodule: Secondary | ICD-10-CM

## 2022-02-06 DIAGNOSIS — M65932 Unspecified synovitis and tenosynovitis, left forearm: Secondary | ICD-10-CM

## 2022-02-06 DIAGNOSIS — H538 Other visual disturbances: Secondary | ICD-10-CM | POA: Insufficient documentation

## 2022-02-06 DIAGNOSIS — M1711 Unilateral primary osteoarthritis, right knee: Secondary | ICD-10-CM | POA: Insufficient documentation

## 2022-02-06 DIAGNOSIS — Z6841 Body Mass Index (BMI) 40.0 and over, adult: Secondary | ICD-10-CM | POA: Insufficient documentation

## 2022-02-06 DIAGNOSIS — E1169 Type 2 diabetes mellitus with other specified complication: Secondary | ICD-10-CM | POA: Insufficient documentation

## 2022-02-06 DIAGNOSIS — M79642 Pain in left hand: Secondary | ICD-10-CM | POA: Insufficient documentation

## 2022-02-06 DIAGNOSIS — F319 Bipolar disorder, unspecified: Secondary | ICD-10-CM | POA: Insufficient documentation

## 2022-02-06 DIAGNOSIS — I152 Hypertension secondary to endocrine disorders: Secondary | ICD-10-CM

## 2022-02-06 LAB — POCT GLYCOSYLATED HEMOGLOBIN (HGB A1C): HbA1c, POC (controlled diabetic range): 5.6 % (ref 0.0–7.0)

## 2022-02-06 LAB — GLUCOSE, POCT (MANUAL RESULT ENTRY): POC Glucose: 101 mg/dl — AB (ref 70–99)

## 2022-02-06 MED ORDER — DICLOFENAC SODIUM 1 % EX GEL: 2.0000 g | Freq: Four times a day (QID) | CUTANEOUS | 1 refills | Status: DC

## 2022-02-06 MED ORDER — RSVPREF3 VAC RECOMB ADJUVANTED 120 MCG/0.5ML IM SUSR
0.5000 mL | Freq: Once | INTRAMUSCULAR | 0 refills | Status: DC
  Filled 2022-02-06 (×2): qty 0.5, 1d supply, fill #0
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
## 2022-02-07 ENCOUNTER — Telehealth: Payer: Medicaid Other | Admitting: Internal Medicine

## 2022-02-12 ENCOUNTER — Ambulatory Visit (INDEPENDENT_AMBULATORY_CARE_PROVIDER_SITE_OTHER): Payer: Medicaid Other | Admitting: Orthopaedic Surgery

## 2022-02-12 ENCOUNTER — Other Ambulatory Visit (HOSPITAL_COMMUNITY): Payer: Self-pay

## 2022-02-12 ENCOUNTER — Other Ambulatory Visit: Payer: Self-pay

## 2022-02-12 DIAGNOSIS — M654 Radial styloid tenosynovitis [de Quervain]: Secondary | ICD-10-CM

## 2022-02-12 MED ORDER — BUPIVACAINE HCL 0.5 % IJ SOLN
0.3300 mL | INTRAMUSCULAR | Status: AC | PRN
  Administered 2022-02-12: .33 mL

## 2022-02-12 MED ORDER — METHYLPREDNISOLONE ACETATE 40 MG/ML IJ SUSP
13.3300 mg | INTRAMUSCULAR | Status: AC | PRN
  Administered 2022-02-12: 13.33 mg

## 2022-02-12 MED ORDER — LIDOCAINE HCL 1 % IJ SOLN
0.3000 mL | INTRAMUSCULAR | Status: AC | PRN
  Administered 2022-02-12: .3 mL
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
## 2022-02-17 ENCOUNTER — Other Ambulatory Visit: Payer: Self-pay | Admitting: Internal Medicine

## 2022-02-17 DIAGNOSIS — Z1231 Encounter for screening mammogram for malignant neoplasm of breast: Secondary | ICD-10-CM

## 2022-03-26 ENCOUNTER — Other Ambulatory Visit: Payer: Self-pay | Admitting: Internal Medicine

## 2022-03-27 ENCOUNTER — Other Ambulatory Visit: Payer: Self-pay

## 2022-03-27 ENCOUNTER — Other Ambulatory Visit (HOSPITAL_COMMUNITY): Payer: Self-pay

## 2022-03-27 MED ORDER — ATORVASTATIN CALCIUM 10 MG PO TABS
10.0000 mg | ORAL_TABLET | Freq: Every day | ORAL | 3 refills | Status: DC
  Filled 2022-03-27: qty 30, 30d supply, fill #0
  Filled 2022-05-17 (×2): qty 30, 30d supply, fill #1
  Filled 2022-06-20: qty 30, 30d supply, fill #2
  Filled 2022-09-07: qty 30, 30d supply, fill #3

## 2022-04-07 ENCOUNTER — Other Ambulatory Visit: Payer: Self-pay

## 2022-04-08 ENCOUNTER — Other Ambulatory Visit: Payer: Self-pay

## 2022-04-10 ENCOUNTER — Other Ambulatory Visit: Payer: Self-pay

## 2022-04-11 ENCOUNTER — Ambulatory Visit
Admission: RE | Admit: 2022-04-11 | Discharge: 2022-04-11 | Disposition: A | Discharge status: Home / Self Care | Payer: Medicaid Other | Source: Ambulatory Visit | Attending: Internal Medicine | Admitting: Internal Medicine

## 2022-04-11 DIAGNOSIS — Z1231 Encounter for screening mammogram for malignant neoplasm of breast: Secondary | ICD-10-CM

## 2022-04-12 ENCOUNTER — Encounter: Payer: Self-pay | Admitting: Internal Medicine

## 2022-04-16 ENCOUNTER — Other Ambulatory Visit: Payer: Self-pay | Admitting: Internal Medicine

## 2022-04-16 DIAGNOSIS — R928 Other abnormal and inconclusive findings on diagnostic imaging of breast: Secondary | ICD-10-CM

## 2022-04-22 ENCOUNTER — Other Ambulatory Visit: Payer: Self-pay | Admitting: Internal Medicine

## 2022-04-22 ENCOUNTER — Ambulatory Visit
Admission: RE | Admit: 2022-04-22 | Discharge: 2022-04-22 | Disposition: A | Discharge status: Home / Self Care | Payer: Medicaid Other | Source: Ambulatory Visit | Attending: Internal Medicine | Admitting: Internal Medicine

## 2022-04-22 DIAGNOSIS — R928 Other abnormal and inconclusive findings on diagnostic imaging of breast: Secondary | ICD-10-CM

## 2022-05-12 ENCOUNTER — Other Ambulatory Visit: Payer: Self-pay | Admitting: Internal Medicine

## 2022-05-12 DIAGNOSIS — I10 Essential (primary) hypertension: Secondary | ICD-10-CM

## 2022-05-12 NOTE — Telephone Encounter (Signed)
Requested Prescriptions  Pending Prescriptions Disp Refills   hydrochlorothiazide (HYDRODIURIL) 25 MG tablet [Pharmacy Med Name: HYDROCHLOROTHIAZIDE 25MG TABLETS] 90 tablet 0    Sig: TAKE 1 TABLET(25 MG) BY MOUTH DAILY     Cardiovascular: Diuretics - Thiazide Passed - 05/12/2022 12:21 AM      Passed - Cr in normal range and within 180 days    Creat  Date Value Ref Range Status  12/05/2015 0.78 0.50 - 1.05 mg/dL Final    Comment:      For patients > or = 61 years of age: The upper reference limit for Creatinine is approximately 13% higher for people identified as African-American.      Creatinine, Ser  Date Value Ref Range Status  01/07/2022 0.96 0.57 - 1.00 mg/dL Final   Creatinine, Urine  Date Value Ref Range Status  12/07/2015 74 20 - 320 mg/dL Final         Passed - K in normal range and within 180 days    Potassium  Date Value Ref Range Status  01/07/2022 4.5 3.5 - 5.2 mmol/L Final         Passed - Na in normal range and within 180 days    Sodium  Date Value Ref Range Status  01/07/2022 137 134 - 144 mmol/L Final         Passed - Last BP in normal range    BP Readings from Last 1 Encounters:  02/06/22 135/88         Passed - Valid encounter within last 6 months    Recent Outpatient Visits           3 months ago Type 2 diabetes mellitus with morbid obesity ([place])   [place] Karle Plumber B, MD   7 months ago Hypertension associated with type 2 diabetes mellitus [facility])   [place], [place] L, RPH-CPP   7 months ago Type 2 diabetes mellitus with morbid obesity [facility])   [place] Ladell Pier, MD   8 months ago Essential hypertension   [place], Jarome Matin, RPH-CPP   9 months ago Essential hypertension   [place], RPH-CPP        Future Appointments             In 4 weeks Ladell Pier, MD [place]

## 2022-05-13 ENCOUNTER — Encounter: Payer: Self-pay | Admitting: Internal Medicine

## 2022-05-17 ENCOUNTER — Other Ambulatory Visit (HOSPITAL_COMMUNITY): Payer: Self-pay

## 2022-06-09 ENCOUNTER — Ambulatory Visit: Payer: Medicaid Other | Admitting: Internal Medicine

## 2022-06-09 ENCOUNTER — Ambulatory Visit: Payer: Medicaid Other | Attending: Internal Medicine | Admitting: Internal Medicine

## 2022-06-09 ENCOUNTER — Encounter: Payer: Self-pay | Admitting: Internal Medicine

## 2022-06-09 DIAGNOSIS — Z853 Personal history of malignant neoplasm of breast: Secondary | ICD-10-CM | POA: Insufficient documentation

## 2022-06-09 DIAGNOSIS — I10 Essential (primary) hypertension: Secondary | ICD-10-CM | POA: Diagnosis not present

## 2022-06-09 DIAGNOSIS — F319 Bipolar disorder, unspecified: Secondary | ICD-10-CM | POA: Diagnosis not present

## 2022-06-09 DIAGNOSIS — E1159 Type 2 diabetes mellitus with other circulatory complications: Secondary | ICD-10-CM

## 2022-06-09 DIAGNOSIS — Z833 Family history of diabetes mellitus: Secondary | ICD-10-CM | POA: Diagnosis not present

## 2022-06-09 DIAGNOSIS — Z5986 Financial insecurity: Secondary | ICD-10-CM | POA: Diagnosis not present

## 2022-06-09 DIAGNOSIS — Z803 Family history of malignant neoplasm of breast: Secondary | ICD-10-CM | POA: Diagnosis not present

## 2022-06-09 DIAGNOSIS — M1711 Unilateral primary osteoarthritis, right knee: Secondary | ICD-10-CM | POA: Diagnosis not present

## 2022-06-09 DIAGNOSIS — L304 Erythema intertrigo: Secondary | ICD-10-CM

## 2022-06-09 DIAGNOSIS — E1169 Type 2 diabetes mellitus with other specified complication: Secondary | ICD-10-CM

## 2022-06-09 DIAGNOSIS — Z79899 Other long term (current) drug therapy: Secondary | ICD-10-CM | POA: Diagnosis not present

## 2022-06-09 DIAGNOSIS — Z8249 Family history of ischemic heart disease and other diseases of the circulatory system: Secondary | ICD-10-CM | POA: Diagnosis not present

## 2022-06-09 DIAGNOSIS — L853 Xerosis cutis: Secondary | ICD-10-CM

## 2022-06-09 DIAGNOSIS — E785 Hyperlipidemia, unspecified: Secondary | ICD-10-CM

## 2022-06-09 DIAGNOSIS — R252 Cramp and spasm: Secondary | ICD-10-CM | POA: Diagnosis not present

## 2022-06-09 DIAGNOSIS — E119 Type 2 diabetes mellitus without complications: Secondary | ICD-10-CM | POA: Insufficient documentation

## 2022-06-09 DIAGNOSIS — I152 Hypertension secondary to endocrine disorders: Secondary | ICD-10-CM

## 2022-06-09 DIAGNOSIS — L819 Disorder of pigmentation, unspecified: Secondary | ICD-10-CM

## 2022-06-09 DIAGNOSIS — R42 Dizziness and giddiness: Secondary | ICD-10-CM

## 2022-06-09 LAB — GLUCOSE, POCT (MANUAL RESULT ENTRY): POC Glucose: 107 mg/dl — AB (ref 70–99)

## 2022-06-09 MED ORDER — NYSTATIN 100000 UNIT/GM EX POWD: 1.0000 | Freq: Every day | CUTANEOUS | 1 refills | Status: DC

## 2022-06-09 NOTE — Patient Instructions (Addendum)
Referral for bariatric surgery was sent to [facility], [address] [place], [place] [address] [phone]  Fax [phone]  Decrease atorvastatin to 4 days a week to see if it would help decrease the cramps that you get at nights.  I have sent a prescription to your pharmacy for the nystatin powder to use under the skin folds when there is a flareup.
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
## 2022-06-10 LAB — POCT GLYCOSYLATED HEMOGLOBIN (HGB A1C): HbA1c, POC (controlled diabetic range): 5.8 % (ref 0.0–7.0)

## 2022-06-13 ENCOUNTER — Ambulatory Visit: Payer: Medicaid Other | Admitting: Allergy

## 2022-06-20 ENCOUNTER — Other Ambulatory Visit: Payer: Self-pay

## 2022-06-20 ENCOUNTER — Other Ambulatory Visit (HOSPITAL_COMMUNITY): Payer: Self-pay

## 2022-06-20 ENCOUNTER — Other Ambulatory Visit: Payer: Self-pay | Admitting: Internal Medicine

## 2022-06-20 ENCOUNTER — Other Ambulatory Visit: Payer: Self-pay | Admitting: Obstetrics and Gynecology

## 2022-06-20 DIAGNOSIS — N3281 Overactive bladder: Secondary | ICD-10-CM

## 2022-06-22 ENCOUNTER — Other Ambulatory Visit: Payer: Self-pay | Admitting: Internal Medicine

## 2022-06-22 DIAGNOSIS — N3281 Overactive bladder: Secondary | ICD-10-CM

## 2022-06-25 ENCOUNTER — Ambulatory Visit (INDEPENDENT_AMBULATORY_CARE_PROVIDER_SITE_OTHER): Payer: Medicaid Other | Admitting: Allergy

## 2022-06-25 ENCOUNTER — Encounter: Payer: Self-pay | Admitting: Allergy

## 2022-06-25 ENCOUNTER — Telehealth: Payer: Self-pay | Admitting: *Deleted

## 2022-06-25 ENCOUNTER — Other Ambulatory Visit: Payer: Self-pay

## 2022-06-25 VITALS — BP 134/82 | HR 80 | Temp 98.0°F | Resp 20 | Wt 275.3 lb

## 2022-06-25 DIAGNOSIS — J45991 Cough variant asthma: Secondary | ICD-10-CM

## 2022-06-25 DIAGNOSIS — J3089 Other allergic rhinitis: Secondary | ICD-10-CM

## 2022-06-25 DIAGNOSIS — H1013 Acute atopic conjunctivitis, bilateral: Secondary | ICD-10-CM

## 2022-06-25 DIAGNOSIS — K219 Gastro-esophageal reflux disease without esophagitis: Secondary | ICD-10-CM

## 2022-06-25 DIAGNOSIS — J358 Other chronic diseases of tonsils and adenoids: Secondary | ICD-10-CM

## 2022-06-25 DIAGNOSIS — L299 Pruritus, unspecified: Secondary | ICD-10-CM

## 2022-06-25 MED ORDER — FLUTICASONE PROPIONATE 50 MCG/ACT NA SUSP: 2.0000 | Freq: Every day | NASAL | 5 refills | Status: DC

## 2022-06-25 MED ORDER — CETIRIZINE HCL 10 MG PO TABS: 10.0000 mg | ORAL_TABLET | Freq: Two times a day (BID) | ORAL | 5 refills | Status: DC

## 2022-06-25 MED ORDER — ALBUTEROL SULFATE HFA 108 (90 BASE) MCG/ACT IN AERS: 2.0000 | INHALATION_SPRAY | Freq: Four times a day (QID) | RESPIRATORY_TRACT | 1 refills | Status: DC | PRN

## 2022-06-25 MED ORDER — AZELASTINE HCL 0.1 % NA SOLN: 1.0000 | Freq: Two times a day (BID) | NASAL | 5 refills | Status: DC

## 2022-06-25 MED ORDER — BUDESONIDE-FORMOTEROL FUMARATE 160-4.5 MCG/ACT IN AERO: 2.0000 | INHALATION_SPRAY | Freq: Two times a day (BID) | RESPIRATORY_TRACT | 5 refills | Status: DC

## 2022-06-25 NOTE — Telephone Encounter (Signed)
Copied from [place] [id number]. Topic: General - Other >> Jun 25, 2022  4:49 PM Denman George T wrote: Reason for CRM: Doyne Keel from [facility] is requesting a call back from Carilyn [place]past[place] to find out if the RSV vaccine is available. She can reached at [phone]

## 2022-06-25 NOTE — Patient Instructions (Addendum)
Allergic rhinitis with conjunctivitis - continue avoidance measures for grass pollen, weed pollen, tree pollen, dust mite, cockroach, cat, and dog.  - Use azelastine (Astelin) 2 sprays each nostril twice daily for runny nose/nasal drainage.  This will help decrease your cough - Use saline rinse prior to nasal spray use.  Use distilled water or boil water and bring to room temperature (do not use tap water).  - Use fluticasone nasal spray 2 sprays each nostril once a day as needed for stuffy nose - Can take Zyrtec 1 tab daily.  May take additional dosing if needed.   Cough variant asthma - lung function testing looks great today! - have access to albuterol inhaler 2 puffs via spacer or albuterol 1 vial via nebulizer every 4-6 hours as needed for cough/wheeze/shortness of breath/chest tightness.  May use 15-20 minutes prior to activity.   Monitor frequency of use.   - use pump inhaler with spacer device (plastic tube).   - use Symbicort 160/4.5 mcg 2 puffs twice a day with spacer. Use this every day for maintenance control of symptom.   Asthma control goals:  Full participation in all desired activities (may need albuterol before activity) Albuterol use two time or less a week on average (not counting use with activity) Cough interfering with sleep two time or less a month Oral steroids no more than once a year No hospitalizations  -concerned that continued dry cough could be due to Losartan use which is an ARB type medication and this group of medications can cause cough.  Would discuss with Dr. Wynetta Emery regarding alterative to Losartan  GERD - can use omeprazole daily for reflux symptom control  Itching - Continue to avoid foods that cause itching - Blood work to tomato and lemon were positive. Recommend avoiding these foods along with other foods that cause itching/ rash and see if this helps. Have access to Epipen 0.3 mg in case of allergic reaction - Testing to orange was  negative.  Tonsil stones - Likely with tonsil stones as the white stone/chunk you coughed up - Gargle with salt-water warm solution or your mouthwash daily to help decrease production of tonsil stones  Follow-up in 6 months or sooner if needed

## 2022-06-25 NOTE — Progress Notes (Signed)
Follow-up Note  RE: Sabrina [name] MRN: [id number] DOB: October 09, 1960 Date of Office Visit: 06/25/2022   History of present illness: Sabrina [name] is a 62 y.o. female presenting today for follow-up of cough variant asthma, allergic rhinitis with conjunctivitis, reflux and itching.  She was last seen in the office on 12/13/2021 by myself. He would like to get the RSV vaccine but states that the [facility] told her that she was not able to get it due to the insurance coverage.  She states the out-of-pocket cost is going to be about $150 which is not doable for her at this time. She does report having cough still.  She also reports having nasal drainage and throat clearing often.  She states sometimes she has coughed up a right little "plaque".  This does not happen very often however.  She states she knows she is not doing what she is supposed to do.  She is not using any nasal sprays nor did she using her Symbicort.  She states she does need refills.  He does report that she has a normal daily basis.  Fortunately it does not appear that she has had any ED or urgent care visits for her breathing since the last visit nor has she required any systemic steroids. She will use omeprazole for reflux control. She does not tolerate the items including tomato and lemon as these have caused itching with ingestion and she has had positive testing for these foods.  She should have access to an epinephrine device.  Review of systems: Review of Systems  Constitutional: Negative.   HENT:         See HPI  Eyes: Negative.   Respiratory:         See HPI  Cardiovascular: Negative.   Gastrointestinal: Negative.   Musculoskeletal: Negative.   Skin: Negative.   Allergic/Immunologic: Negative.   Neurological: Negative.      All other systems negative unless noted above in HPI  Past medical/social/surgical/family history have been reviewed and are unchanged unless specifically indicated  below.  No changes  Medication List: Current Outpatient Medications  Medication Sig Dispense Refill   albuterol (VENTOLIN HFA) 108 (90 Base) MCG/ACT inhaler Inhale 2 puffs into the lungs every 6 (six) hours as needed for wheezing or shortness of breath. 18 g 1   aspirin EC 81 MG tablet Take 1 tablet (81 mg total) by mouth daily. For heart health     atorvastatin (LIPITOR) 10 MG tablet Take 1 tablet (10 mg total) by mouth daily. 30 tablet 3   azelastine (ASTELIN) 0.1 % nasal spray Place 1-2 sprays into both nostrils 2 (two) times daily. 30 mL 5   Blood Glucose Monitoring Suppl (TRUE METRIX METER) w/Device KIT Use as directed 1 kit 0   budesonide-formoterol (SYMBICORT) 160-4.5 MCG/ACT inhaler Inhale 2 puffs into the lungs 2 (two) times daily. 2 puffs twice a day with a spacer. Rinse mouth after use. 10.2 g 5   cetirizine (ZYRTEC) 10 MG tablet Take 1 tablet (10 mg total) by mouth 2 (two) times daily. 60 tablet 5   diclofenac Sodium (VOLTAREN) 1 % GEL Apply 2 g topically 4 (four) times daily. (Patient not taking: Reported on 06/09/2022) 100 g 1   divalproex (DEPAKOTE ER) 250 MG 24 hr tablet Take 3 tablets (750 mg total) by mouth at bedtime. For mood stabilization 90 tablet 0   fluticasone (FLONASE) 50 MCG/ACT nasal spray Place 2 sprays into both nostrils daily. 18.2 g  5   glucose blood (TRUE METRIX BLOOD GLUCOSE TEST) test strip Use as instructed 100 each 12   hydrochlorothiazide (HYDRODIURIL) 25 MG tablet TAKE 1 TABLET(25 MG) BY MOUTH DAILY 90 tablet 0   ibuprofen (ADVIL) 600 MG tablet Take 1 tablet (600 mg total) by mouth every 8 (eight) hours as needed. 30 tablet 0   metFORMIN (GLUCOPHAGE) 500 MG tablet TAKE 1 TABLET (500 MG TOTAL) BY MOUTH DAILY WITH BREAKFAST. FOR DIABETES MANANGEMENT 90 tablet 3   nystatin (MYCOSTATIN/NYSTOP) powder Apply 1 Application topically daily. Apply under the abdominal fold and under the breasts when there is a flareup 15 g 1   Spacer/Aero-Holding Chambers DEVI Use  Spacer with Albuterol inhaler as needed 1 Container 0   TRUEPLUS LANCETS 28G MISC Use as directed 100 each 6   No current facility-administered medications for this visit.     Known medication allergies: Allergies  Allergen Reactions   Benzonatate Other (See Comments)    Made her cough worse   Latex Dermatitis and Other (See Comments)    Hands became scaly   Losartan Cough   Penicillins Other (See Comments)    Did it involve swelling of the face/tongue/throat, SOB, or low BP? Unk Did it involve sudden or severe rash/hives, skin peeling, or any reaction on the inside of your mouth or nose? Unk Did you need to seek medical attention at a hospital or doctor's office? Unk When did it last happen? "I was little; I don't remember, but my parents told me to never take it."  If all above answers are "NO", may proceed with cephalosporin use.     Lisinopril Cough   Oxycodone-Acetaminophen Nausea And Vomiting    Pt thinks she may be able to take with food (??)     Physical examination: Blood pressure 134/82, pulse 80, temperature 98 F (36.7 C), temperature source Temporal, resp. rate 20, weight 275 lb 4.8 oz (124.9 kg), SpO2 99 %.  General: Alert, interactive, in no acute distress. HEENT: PERRLA, TMs pearly gray, turbinates mildly edematous with clear discharge, post-pharynx non erythematous. Neck: Supple without lymphadenopathy. Lungs: Clear to auscultation without wheezing, rhonchi or rales. {no increased work of breathing. CV: Normal S1, S2 without murmurs. Abdomen: Nondistended, nontender. Skin: Warm and dry, without lesions or rashes. Extremities:  No clubbing, cyanosis or edema. Neuro:   Grossly intact.  Diagnositics/Labs: Spirometry: FEV1: 1.96L 92%, FVC: 2.61L 97%, ratio consistent with nonobstructive pattern  Assessment and plan: Allergic rhinitis with conjunctivitis - continue avoidance measures for grass pollen, weed pollen, tree pollen, dust mite, cockroach, cat, and  dog.  - Use azelastine (Astelin) 2 sprays each nostril twice daily for runny nose/nasal drainage.  This will help decrease your cough - Use saline rinse prior to nasal spray use.  Use distilled water or boil water and bring to room temperature (do not use tap water).  - Use fluticasone nasal spray 2 sprays each nostril once a day as needed for stuffy nose - Can take Zyrtec 1 tab daily.  May take additional dosing if needed.   Cough variant asthma - lung function testing looks great today! - have access to albuterol inhaler 2 puffs via spacer or albuterol 1 vial via nebulizer every 4-6 hours as needed for cough/wheeze/shortness of breath/chest tightness.  May use 15-20 minutes prior to activity.   Monitor frequency of use.   - use pump inhaler with spacer device (plastic tube).   - use Symbicort 160/4.5 mcg 2 puffs twice a day with  spacer. Use this every day for maintenance control of symptom.   Asthma control goals:  Full participation in all desired activities (may need albuterol before activity) Albuterol use two time or less a week on average (not counting use with activity) Cough interfering with sleep two time or less a month Oral steroids no more than once a year No hospitalizations  -concerned that continued dry cough could be due to Losartan use which is an ARB type medication and this group of medications can cause cough.  Would discuss with Dr. Wynetta Emery regarding alterative to Losartan  GERD - can use omeprazole daily for reflux symptom control  Itching - Continue to avoid foods that cause itching - Blood work to tomato and lemon were positive. Recommend avoiding these foods along with other foods that cause itching/ rash and see if this helps. Have access to Epipen 0.3 mg in case of allergic reaction - Testing to orange was negative.  Tonsil stones - Likely with tonsil stones as the white stone/chunk you coughed up - Gargle with salt-water warm solution or your mouthwash  daily to help decrease production of tonsil stones  Discussed the importance of medication compliance to help with her symptoms control. Follow-up in 6 months or sooner if needed  I appreciate the opportunity to take part in Liseth's care. Please do not hesitate to contact me with questions.  Sincerely,   Prudy Feeler, MD Allergy/Immunology [facility]

## 2022-06-27 NOTE — Telephone Encounter (Signed)
LVM-  Vaccine in not provided by PCP onsite.  Advised patient to contact pharmacy for RSV vaccine.

## 2022-06-29 ENCOUNTER — Encounter: Payer: Self-pay | Admitting: Internal Medicine

## 2022-06-30 ENCOUNTER — Other Ambulatory Visit: Payer: Self-pay | Admitting: Internal Medicine

## 2022-06-30 DIAGNOSIS — N3281 Overactive bladder: Secondary | ICD-10-CM

## 2022-06-30 MED ORDER — SOLIFENACIN SUCCINATE 10 MG PO TABS: ORAL_TABLET | Freq: Every day | ORAL | 5 refills | Status: DC

## 2022-07-04 ENCOUNTER — Ambulatory Visit (HOSPITAL_COMMUNITY)
Admission: EM | Admit: 2022-07-04 | Discharge: 2022-07-04 | Disposition: A | Discharge status: Home / Self Care | Payer: Medicaid Other

## 2022-07-04 ENCOUNTER — Ambulatory Visit: Payer: Self-pay | Admitting: *Deleted

## 2022-07-04 ENCOUNTER — Encounter (HOSPITAL_COMMUNITY): Payer: Self-pay

## 2022-07-04 DIAGNOSIS — R197 Diarrhea, unspecified: Secondary | ICD-10-CM | POA: Diagnosis not present

## 2022-07-04 NOTE — Telephone Encounter (Signed)
noted 

## 2022-07-04 NOTE — Discharge Instructions (Addendum)
Drink plenty of fluids. Continue with Imodium as needed. Turn for evaluation if develop severe abdominal pain, fever, chills.
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
## 2022-07-04 NOTE — Telephone Encounter (Signed)
Reason for Disposition  [1] SEVERE diarrhea (e.g., 7 or more times / day more than normal) AND [2] present > 24 hours (1 day)  Answer Assessment - Initial Assessment Questions 1. DIARRHEA SEVERITY: "How bad is the diarrhea?" "How many more stools have you had in the past 24 hours than normal?"    - NO DIARRHEA (SCALE 0)   - MILD (SCALE 1-3): Few loose or mushy BMs; increase of 1-3 stools over normal daily number of stools; mild increase in ostomy output.   -  MODERATE (SCALE 4-7): Increase of 4-6 stools daily over normal; moderate increase in ostomy output.   -  SEVERE (SCALE 8-10; OR "WORST POSSIBLE"): Increase of 7 or more stools daily over normal; moderate increase in ostomy output; incontinence.     I'm having a lot of diarrhea today.   Sometimes I think it's gas but it's not.   I've had to change my clothes but a few times.    I have not eaten anything today.   I don't have an appetite.     2. ONSET: "When did the diarrhea begin?"      Going on since Tues.   I missed work on [employer].   Sometimes my head will hurt and sometimes have nausea.    3. BM CONSISTENCY: "How loose or watery is the diarrhea?"      I just took Imodium now.   I just took my first dose just now of it.   4. VOMITING: "Are you also vomiting?" If Yes, ask: "How many times in the past 24 hours?"      No 5. ABDOMEN PAIN: "Are you having any abdomen pain?" If Yes, ask: "What does it feel like?" (e.g., crampy, dull, intermittent, constant)      The diarrhea   it's sandy reddish brown and watery.    I'm having to wear adult diapers.     6. ABDOMEN PAIN SEVERITY: If present, ask: "How bad is the pain?"  (e.g., Scale 1-10; mild, moderate, or severe)   - MILD (1-3): doesn't interfere with normal activities, abdomen soft and not tender to touch    - MODERATE (4-7): interferes with normal activities or awakens from sleep, abdomen tender to touch    - SEVERE (8-10): excruciating pain, doubled over, unable to do any normal activities        Severe my behind is so sore and raw back there.    I'm having abd cramps.   It's grumbling a lot when I eat anything. 7. ORAL INTAKE: If vomiting, "Have you been able to drink liquids?" "How much liquids have you had in the past 24 hours?"     I'm having watery diarrhea.   I'm drinking water.   I've drank water.   Ginger Al.     8. HYDRATION: "Any signs of dehydration?" (e.g., dry mouth [not just dry lips], too weak to stand, dizziness, new weight loss) "When did you last urinate?"     I feel dehydrated.   I feel a little dizzy.    I'm going to try some soup.    9. EXPOSURE: "Have you traveled to a foreign country recently?" "Have you been exposed to anyone with diarrhea?" "Could you have eaten any food that was spoiled?"     My husband had diarrhea same as me about 2-3 weeks ago.   He had Norovirus.    10. ANTIBIOTIC USE: "Are you taking antibiotics now or have you taken antibiotics in the  past 2 months?"       No  My husband is not here.   He is out of town on his job.    11. OTHER SYMPTOMS: "Do you have any other symptoms?" (e.g., fever, blood in stool)       She had breast cancer.    12. PREGNANCY: "Is there any chance you are pregnant?" "When was your last menstrual period?"       Not asked  Protocols used: [facility]

## 2022-07-04 NOTE — Telephone Encounter (Signed)
  Chief Complaint: Diarrhea with some incontinence.   Husband had Norovirus 2-3 wks ago. Symptoms: Watery diarrhea.  Very raw bottom.  Feels like she is getting dehydrated.   Abd cramping and grumbling. Frequency: Several times a day.   Pertinent Negatives: Patient denies fever or other symptoms. Disposition: [] ED /[x] Urgent Care (no appt availability in office) / [] Appointment(In office/virtual)/ []  [place] [facility]/ [] Home Care/ [] Refused Recommended Disposition /[] [place] [facility]/ []  Follow-up with PCP Additional Notes: She is going to the [facility] in front of [facility] because no appts. With [facility].

## 2022-07-04 NOTE — ED Triage Notes (Signed)
Patient c/o generalized intermittent abdominal discomfort and diarrhea x 4 days. Patient also c/o intermittent nausea.  Patient reports that she took Imodium at 1400 today.

## 2022-07-14 ENCOUNTER — Other Ambulatory Visit: Payer: Self-pay | Admitting: Internal Medicine

## 2022-07-14 DIAGNOSIS — N3281 Overactive bladder: Secondary | ICD-10-CM

## 2022-07-14 MED ORDER — SOLIFENACIN SUCCINATE 10 MG PO TABS: ORAL_TABLET | Freq: Every day | ORAL | 5 refills | Status: DC

## 2022-07-23 ENCOUNTER — Other Ambulatory Visit: Payer: Self-pay | Admitting: Internal Medicine

## 2022-07-23 ENCOUNTER — Ambulatory Visit
Admission: RE | Admit: 2022-07-23 | Discharge: 2022-07-23 | Disposition: A | Discharge status: Home / Self Care | Payer: Medicaid Other | Source: Ambulatory Visit | Attending: Internal Medicine | Admitting: Internal Medicine

## 2022-07-23 DIAGNOSIS — R928 Other abnormal and inconclusive findings on diagnostic imaging of breast: Secondary | ICD-10-CM

## 2022-07-29 ENCOUNTER — Other Ambulatory Visit: Payer: Self-pay

## 2022-08-26 ENCOUNTER — Other Ambulatory Visit: Payer: Self-pay | Admitting: Internal Medicine

## 2022-08-26 DIAGNOSIS — I10 Essential (primary) hypertension: Secondary | ICD-10-CM

## 2022-09-08 ENCOUNTER — Other Ambulatory Visit: Payer: Self-pay

## 2022-10-13 ENCOUNTER — Ambulatory Visit: Payer: MEDICAID | Attending: Internal Medicine | Admitting: Internal Medicine

## 2022-10-21 ENCOUNTER — Other Ambulatory Visit: Payer: Self-pay | Admitting: Internal Medicine

## 2022-10-22 ENCOUNTER — Other Ambulatory Visit (HOSPITAL_COMMUNITY): Payer: Self-pay

## 2022-10-22 MED ORDER — ATORVASTATIN CALCIUM 10 MG PO TABS
10.0000 mg | ORAL_TABLET | Freq: Every day | ORAL | 0 refills | Status: DC
  Filled 2022-10-22: qty 30, 30d supply, fill #0

## 2022-11-17 ENCOUNTER — Other Ambulatory Visit: Payer: Self-pay

## 2022-11-17 ENCOUNTER — Other Ambulatory Visit (HOSPITAL_COMMUNITY): Payer: Self-pay

## 2022-11-17 ENCOUNTER — Other Ambulatory Visit: Payer: Self-pay | Admitting: Internal Medicine

## 2022-11-17 MED ORDER — METFORMIN HCL 500 MG PO TABS
500.0000 mg | ORAL_TABLET | Freq: Every day | ORAL | 0 refills | Status: DC
Start: 2022-11-17 — End: 2023-03-09
  Filled 2022-11-17 (×2): qty 90, 90d supply, fill #0

## 2022-11-18 ENCOUNTER — Other Ambulatory Visit: Payer: Self-pay

## 2022-12-10 ENCOUNTER — Other Ambulatory Visit: Payer: Self-pay | Admitting: Internal Medicine

## 2022-12-10 DIAGNOSIS — I10 Essential (primary) hypertension: Secondary | ICD-10-CM

## 2022-12-18 ENCOUNTER — Encounter: Payer: Self-pay | Admitting: Internal Medicine

## 2023-01-01 ENCOUNTER — Encounter: Payer: Self-pay | Admitting: Allergy

## 2023-01-01 ENCOUNTER — Ambulatory Visit (INDEPENDENT_AMBULATORY_CARE_PROVIDER_SITE_OTHER): Payer: MEDICAID | Admitting: Allergy

## 2023-01-01 ENCOUNTER — Other Ambulatory Visit: Payer: Self-pay

## 2023-01-01 VITALS — BP 118/70 | HR 72 | Temp 97.0°F | Ht 63.78 in | Wt 275.9 lb

## 2023-01-01 DIAGNOSIS — J3089 Other allergic rhinitis: Secondary | ICD-10-CM

## 2023-01-01 DIAGNOSIS — H1013 Acute atopic conjunctivitis, bilateral: Secondary | ICD-10-CM | POA: Diagnosis not present

## 2023-01-01 DIAGNOSIS — J45991 Cough variant asthma: Secondary | ICD-10-CM

## 2023-01-01 DIAGNOSIS — K219 Gastro-esophageal reflux disease without esophagitis: Secondary | ICD-10-CM

## 2023-01-01 DIAGNOSIS — J358 Other chronic diseases of tonsils and adenoids: Secondary | ICD-10-CM

## 2023-01-01 DIAGNOSIS — L299 Pruritus, unspecified: Secondary | ICD-10-CM

## 2023-01-01 MED ORDER — BUDESONIDE-FORMOTEROL FUMARATE 160-4.5 MCG/ACT IN AERO: 2.0000 | INHALATION_SPRAY | Freq: Two times a day (BID) | RESPIRATORY_TRACT | 5 refills | Status: DC

## 2023-01-01 MED ORDER — CETIRIZINE HCL 10 MG PO TABS
10.0000 mg | ORAL_TABLET | Freq: Two times a day (BID) | ORAL | 5 refills | Status: DC
Start: 2023-01-01 — End: 2023-07-02

## 2023-01-01 MED ORDER — ALBUTEROL SULFATE HFA 108 (90 BASE) MCG/ACT IN AERS: 2.0000 | INHALATION_SPRAY | Freq: Four times a day (QID) | RESPIRATORY_TRACT | 1 refills | Status: DC | PRN

## 2023-01-01 NOTE — Patient Instructions (Addendum)
Allergic rhinitis with conjunctivitis - continue avoidance measures for grass pollen, weed pollen, tree pollen, dust mite, cockroach, cat, and dog.  - Use azelastine (Astelin) blue nasal spray 2 sprays each nostril twice daily for runny nose/nasal drainage.  This will help decrease your cough - Use saline rinse prior to nasal spray use.  Use distilled water or boil water and bring to room temperature (do not use tap water).  - Use fluticasone nasal spray green nasal spray 2 sprays each nostril once a day as needed for stuffy nose - Can take Zyrtec 1 tab daily.  May take additional dosing if needed.   Cough variant asthma - lung function testing looks great today! - have access to albuterol inhaler 2 puffs via spacer or albuterol 1 vial via nebulizer every 4-6 hours as needed for cough/wheeze/shortness of breath/chest tightness.  May use 15-20 minutes prior to activity.   Monitor frequency of use.   - use pump inhaler with spacer device (plastic tube).   - use Symbicort 160/4.5 mcg 2 puffs twice a day with spacer. Use this every day for maintenance control of symptom.   Asthma control goals:  Full participation in all desired activities (may need albuterol before activity) Albuterol use two time or less a week on average (not counting use with activity) Cough interfering with sleep two time or less a month Oral steroids no more than once a year No hospitalizations  GERD - can use omeprazole daily for reflux symptom control  Itching - Continue to avoid foods that cause itching - Blood work to tomato and lemon were positive. Recommend avoiding these foods along with other foods that cause itching/ rash and see if this helps. Have access to Epipen 0.3 mg in case of allergic reaction - Testing to orange was negative.  Tonsil stones - No stones on exam today - Gargle with salt-water warm solution or your mouthwash daily to help decrease production of tonsil stones  Follow-up in 6 months or  sooner if needed

## 2023-01-01 NOTE — Progress Notes (Signed)
Follow-up Note  RE: Sabrina [name] MRN: [id number] DOB: 1960/10/02 Date of Office Visit: 01/01/2023   History of present illness: Sabrina [name] is a 62 y.o. female presenting today for follow-up of allergic rhinitis with conjunctivitis, asthma, GERD, pruritus and tonsil stones.  She was last seen in the office on 06/25/22 by myself.    Discussed the use of AI scribe software for clinical note transcription with the patient, who gave verbal consent to proceed.  She still has a persistent cough. She states she has not been compliant with her medications.  She did bring in her medication box to the visit. The cough has been ongoing and severe enough to cause discomfort, with the patient describing it as feeling like she is going to "hack their insides up." The patient has not been using Symbicort inhaler for her cough and the current inhaler she has has expired. The patient also reported difficulty using their nasal sprays due to mechanical issues as she was not aware to remove the c-clip to allow it to spray fully, which have since been resolved during the consultation (I showed her how to remove the c-clip).  The patient has been experiencing nasal symptoms, including itchiness and a runny nose.  she has both Azelastine and Flonase nasal sprays that don't expire until 2025. However, the patient admitted to discontinuing the use of these sprays out of frustration due to perceived lack of efficacy as she couldn't get it to spray (see above).   The patient also reported living in an environment with mold, expressing concerns about potential inhalation of mold spores and its impact on her respiratory health. The patient has attempted to manage the mold issue with various cleaning methods, but the problem persists.  She has made her landlord aware but they have not done anything to remedy the issue.  The patient has been taking other medications, including Metformin, Atorvastatin,  Solifenacin, and Hydrochlorothiazide. The patient also reported occasional reflux symptoms, for which they have been prescribed Omeprazole.   The patient expressed interest in receiving the RSV vaccine, the flu vaccine, and the COVID-19 booster shot.     Review of systems: 10pt ROS negative unless noted above in HPI  Past medical/social/surgical/family history have been reviewed and are unchanged unless specifically indicated below.  No changes  Medication List: Current Outpatient Medications  Medication Sig Dispense Refill   aspirin EC 81 MG tablet Take 1 tablet (81 mg total) by mouth daily. For heart health     atorvastatin (LIPITOR) 10 MG tablet Take 1 tablet (10 mg total) by mouth daily. 30 tablet 0   Blood Glucose Monitoring Suppl (TRUE METRIX METER) w/Device KIT Use as directed 1 kit 0   divalproex (DEPAKOTE ER) 250 MG 24 hr tablet Take 3 tablets (750 mg total) by mouth at bedtime. For mood stabilization 90 tablet 0   glucose blood (TRUE METRIX BLOOD GLUCOSE TEST) test strip Use as instructed 100 each 12   hydrochlorothiazide (HYDRODIURIL) 25 MG tablet TAKE 1 TABLET(25 MG) BY MOUTH DAILY 90 tablet 0   ibuprofen (ADVIL) 600 MG tablet Take 1 tablet (600 mg total) by mouth every 8 (eight) hours as needed. 30 tablet 0   metFORMIN (GLUCOPHAGE) 500 MG tablet Take 1 tablet (500 mg total) by mouth daily with breakfast. 90 tablet 0   nystatin (MYCOSTATIN/NYSTOP) powder Apply 1 Application topically daily. Apply under the abdominal fold and under the breasts when there is a flareup 15 g 1  solifenacin (VESICARE) 10 MG tablet TAKE 1 TABLET (10 MG TOTAL) BY MOUTH DAILY. 30 tablet 5   Spacer/Aero-Holding Chambers DEVI Use Spacer with Albuterol inhaler as needed 1 Container 0   TRUEPLUS LANCETS 28G MISC Use as directed 100 each 6   albuterol (VENTOLIN HFA) 108 (90 Base) MCG/ACT inhaler Inhale 2 puffs into the lungs every 6 (six) hours as needed for wheezing or shortness of breath. 18 g 1    azelastine (ASTELIN) 0.1 % nasal spray Place 1-2 sprays into both nostrils 2 (two) times daily. (Patient not taking: Reported on 01/01/2023) 30 mL 5   budesonide-formoterol (SYMBICORT) 160-4.5 MCG/ACT inhaler Inhale 2 puffs into the lungs 2 (two) times daily. Rinse mouth after use. 10.2 g 5   cetirizine (ZYRTEC) 10 MG tablet Take 1 tablet (10 mg total) by mouth 2 (two) times daily. 60 tablet 5   diclofenac Sodium (VOLTAREN) 1 % GEL Apply 2 g topically 4 (four) times daily. (Patient not taking: Reported on 06/09/2022) 100 g 1   fluticasone (FLONASE) 50 MCG/ACT nasal spray Place 2 sprays into both nostrils daily. (Patient not taking: Reported on 01/01/2023) 18.2 g 5   No current facility-administered medications for this visit.     Known medication allergies: Allergies  Allergen Reactions   Benzonatate Other (See Comments)    Made her cough worse   Latex Dermatitis and Other (See Comments)    Hands became scaly   Losartan Cough   Penicillins Other (See Comments)    Did it involve swelling of the face/tongue/throat, SOB, or low BP? Unk Did it involve sudden or severe rash/hives, skin peeling, or any reaction on the inside of your mouth or nose? Unk Did you need to seek medical attention at a hospital or doctor's office? Unk When did it last happen? "I was little; I don't remember, but my parents told me to never take it."  If all above answers are "NO", may proceed with cephalosporin use.     Lisinopril Cough   Oxycodone-Acetaminophen Nausea And Vomiting    Pt thinks she may be able to take with food (??)     Physical examination: Blood pressure 118/70, pulse 72, temperature (!) 97 F (36.1 C), height 5' 3.78" (1.62 m), weight 275 lb 14.4 oz (125.1 kg), SpO2 96%.  General: Alert, interactive, in no acute distress. HEENT: PERRLA, TMs pearly gray, turbinates mildly edematous without discharge, post-pharynx non erythematous. Neck: Supple without lymphadenopathy. Lungs: Clear to  auscultation without wheezing, rhonchi or rales. {no increased work of breathing. CV: Normal S1, S2 without murmurs. Abdomen: Nondistended, nontender. Skin: Warm and dry, without lesions or rashes. Extremities:  No clubbing, cyanosis or edema. Neuro:   Grossly intact.  Diagnositics/Labs:  Spirometry: FEV1: 1.81L 88%, FVC: 2.52L 97%, ratio consistent with nonobstructive pattern  Assessment and plan:   Allergic rhinitis with conjunctivitis - continue avoidance measures for grass pollen, weed pollen, tree pollen, dust mite, cockroach, cat, and dog.  - Use azelastine (Astelin) blue nasal spray 2 sprays each nostril twice daily for runny nose/nasal drainage.  This will help decrease your cough - Use saline rinse prior to nasal spray use.  Use distilled water or boil water and bring to room temperature (do not use tap water).  - Use fluticasone nasal spray green nasal spray 2 sprays each nostril once a day as needed for stuffy nose - Can take Zyrtec 1 tab daily.  May take additional dosing if needed.   Cough variant asthma - lung function testing  looks great today! - have access to albuterol inhaler 2 puffs via spacer or albuterol 1 vial via nebulizer every 4-6 hours as needed for cough/wheeze/shortness of breath/chest tightness.  May use 15-20 minutes prior to activity.   Monitor frequency of use.   - use pump inhaler with spacer device (plastic tube).   - use Symbicort 160/4.5 mcg 2 puffs twice a day with spacer. Use this every day for maintenance control of symptom.  - agree with receiving this years flu, covid and RSV vaccine  Asthma control goals:  Full participation in all desired activities (may need albuterol before activity) Albuterol use two time or less a week on average (not counting use with activity) Cough interfering with sleep two time or less a month Oral steroids no more than once a year No hospitalizations  GERD - can use omeprazole daily for reflux symptom  control  Itching - Continue to avoid foods that cause itching - Blood work to tomato and lemon were positive. Recommend avoiding these foods along with other foods that cause itching/ rash and see if this helps. Have access to Epipen 0.3 mg in case of allergic reaction - Testing to orange was negative.  Tonsil stones - No stones on exam today - Gargle with salt-water warm solution or your mouthwash daily to help decrease production of tonsil stones  Follow-up in 6 months or sooner if needed  I appreciate the opportunity to take part in Sabrina [name]'s care. Please do not hesitate to contact me with questions.  Sincerely,   Margo Aye, MD Allergy/Immunology [facility]

## 2023-01-11 ENCOUNTER — Other Ambulatory Visit: Payer: Self-pay | Admitting: Internal Medicine

## 2023-01-12 ENCOUNTER — Ambulatory Visit: Payer: MEDICAID | Attending: Internal Medicine | Admitting: Internal Medicine

## 2023-01-12 ENCOUNTER — Other Ambulatory Visit (HOSPITAL_COMMUNITY): Payer: Self-pay

## 2023-01-12 ENCOUNTER — Encounter: Payer: Self-pay | Admitting: Internal Medicine

## 2023-01-12 ENCOUNTER — Other Ambulatory Visit: Payer: Self-pay

## 2023-01-12 VITALS — BP 112/78 | HR 72 | Ht 63.0 in | Wt 278.0 lb

## 2023-01-12 DIAGNOSIS — L304 Erythema intertrigo: Secondary | ICD-10-CM

## 2023-01-12 DIAGNOSIS — Z23 Encounter for immunization: Secondary | ICD-10-CM | POA: Diagnosis not present

## 2023-01-12 DIAGNOSIS — E1169 Type 2 diabetes mellitus with other specified complication: Secondary | ICD-10-CM

## 2023-01-12 DIAGNOSIS — L299 Pruritus, unspecified: Secondary | ICD-10-CM | POA: Diagnosis not present

## 2023-01-12 DIAGNOSIS — R252 Cramp and spasm: Secondary | ICD-10-CM

## 2023-01-12 DIAGNOSIS — R400 Somnolence: Secondary | ICD-10-CM | POA: Diagnosis not present

## 2023-01-12 DIAGNOSIS — Z7984 Long term (current) use of oral hypoglycemic drugs: Secondary | ICD-10-CM

## 2023-01-12 DIAGNOSIS — R0683 Snoring: Secondary | ICD-10-CM | POA: Diagnosis not present

## 2023-01-12 LAB — POCT GLYCOSYLATED HEMOGLOBIN (HGB A1C): HbA1c, POC (controlled diabetic range): 6 % (ref 0.0–7.0)

## 2023-01-12 LAB — GLUCOSE, POCT (MANUAL RESULT ENTRY): POC Glucose: 130 mg/dL — AB (ref 70–99)

## 2023-01-12 MED ORDER — NYSTATIN 100000 UNIT/GM EX POWD
1.0000 | Freq: Every day | CUTANEOUS | 1 refills | Status: DC
Start: 2023-01-12 — End: 2023-05-11
  Filled 2023-01-12: qty 15, 15d supply, fill #0

## 2023-01-12 MED ORDER — DICLOFENAC SODIUM 1 % EX GEL
2.0000 g | Freq: Four times a day (QID) | CUTANEOUS | 1 refills | Status: DC
  Filled 2023-01-12: qty 100, fill #0

## 2023-01-12 MED ORDER — ATORVASTATIN CALCIUM 10 MG PO TABS
10.0000 mg | ORAL_TABLET | Freq: Every day | ORAL | 0 refills | Status: DC
  Filled 2023-01-12: qty 90, 90d supply, fill #0

## 2023-01-12 MED ORDER — TRIAMCINOLONE ACETONIDE 0.1 % EX CREA
1.0000 | TOPICAL_CREAM | Freq: Every day | CUTANEOUS | 0 refills | Status: DC | PRN
Start: 2023-01-12 — End: 2023-12-25
  Filled 2023-01-12: qty 30, 30d supply, fill #0

## 2023-01-12 NOTE — Patient Instructions (Signed)
Stop atorvastatin until next visit to see if the cramps in the legs stop.   Try to get in at least 7 to 8 hours of sleep at nights.  You may need to get in bed earlier than 10 PM.  We have ordered a sleep study. Do not drive when you feel sleepy or is not well rested.
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
## 2023-01-12 NOTE — Telephone Encounter (Signed)
Requested Prescriptions  Pending Prescriptions Disp Refills   atorvastatin (LIPITOR) 10 MG tablet 90 tablet 0    Sig: Take 1 tablet (10 mg total) by mouth daily.     Cardiovascular:  Antilipid - Statins Failed - 01/11/2023  9:13 PM      Failed - Lipid Panel in normal range within the last 12 months    Cholesterol, Total  Date Value Ref Range Status  10/04/2021 171 100 - 199 mg/dL Final   LDL Chol Calc (NIH)  Date Value Ref Range Status  10/04/2021 64 0 - 99 mg/dL Final   HDL  Date Value Ref Range Status  10/04/2021 85 >39 mg/dL Final   Triglycerides  Date Value Ref Range Status  10/04/2021 128 0 - 149 mg/dL Final         Passed - Patient is not pregnant      Passed - Valid encounter within last 12 months    Recent Outpatient Visits           Today Muscle cramps   [place] Jonah Blue B, MD   7 months ago Type 2 diabetes mellitus with morbid obesity [facility])   [facility] Jonah Blue B, MD   11 months ago Type 2 diabetes mellitus with morbid obesity [facility])   [place] [facility] Jonah Blue B, MD   1 year ago Hypertension associated with type 2 diabetes mellitus [facility])   [facility] [place], [place] L, RPH-CPP   1 year ago Type 2 diabetes mellitus with morbid obesity [facility])    [place] [facility] Marcine Matar, MD

## 2023-01-13 ENCOUNTER — Other Ambulatory Visit (HOSPITAL_COMMUNITY): Payer: Self-pay

## 2023-01-14 LAB — MICROALBUMIN / CREATININE URINE RATIO
Creatinine, Urine: 87.1 mg/dL
Microalb/Creat Ratio: 5 mg/g{creat} (ref 0–29)
Microalbumin, Urine: 4.3 ug/mL

## 2023-01-14 LAB — LIPID PANEL
Chol/HDL Ratio: 1.9 {ratio} (ref 0.0–4.4)
Cholesterol, Total: 191 mg/dL (ref 100–199)
HDL: 100 mg/dL (ref >39)
LDL Chol Calc (NIH): 66 mg/dL (ref 0–99)
Triglycerides: 156 mg/dL — ABNORMAL HIGH (ref 0–149)
VLDL Cholesterol Cal: 25 mg/dL (ref 5–40)

## 2023-01-14 LAB — COMPREHENSIVE METABOLIC PANEL
ALT: 21 [IU]/L (ref 0–32)
AST: 24 [IU]/L (ref 0–40)
Albumin: 4 g/dL (ref 3.9–4.9)
Alkaline Phosphatase: 80 [IU]/L (ref 44–121)
BUN/Creatinine Ratio: 12 (ref 12–28)
BUN: 16 mg/dL (ref 8–27)
Bilirubin Total: 0.2 mg/dL (ref 0.0–1.2)
CO2: 25 mmol/L (ref 20–29)
Calcium: 10 mg/dL (ref 8.7–10.3)
Chloride: 100 mmol/L (ref 96–106)
Creatinine, Ser: 1.32 mg/dL — ABNORMAL HIGH (ref 0.57–1.00)
Globulin, Total: 3.4 g/dL (ref 1.5–4.5)
Glucose: 80 mg/dL (ref 70–99)
Potassium: 4.1 mmol/L (ref 3.5–5.2)
Sodium: 139 mmol/L (ref 134–144)
Total Protein: 7.4 g/dL (ref 6.0–8.5)
eGFR: 46 mL/min/{1.73_m2} — ABNORMAL LOW (ref >59)

## 2023-01-14 LAB — CBC
Hematocrit: 40.3 % (ref 34.0–46.6)
Hemoglobin: 13.2 g/dL (ref 11.1–15.9)
MCH: 31.9 pg (ref 26.6–33.0)
MCHC: 32.8 g/dL (ref 31.5–35.7)
MCV: 97 fL (ref 79–97)
Platelets: 338 10*3/uL (ref 150–450)
RBC: 4.14 x10E6/uL (ref 3.77–5.28)
RDW: 12.9 % (ref 11.7–15.4)
WBC: 7.8 10*3/uL (ref 3.4–10.8)

## 2023-01-14 LAB — CK: Total CK: 238 U/L — ABNORMAL HIGH (ref 32–182)

## 2023-01-20 ENCOUNTER — Other Ambulatory Visit: Payer: Self-pay

## 2023-01-29 ENCOUNTER — Other Ambulatory Visit: Payer: Self-pay | Admitting: Internal Medicine

## 2023-01-29 DIAGNOSIS — N3281 Overactive bladder: Secondary | ICD-10-CM

## 2023-03-09 ENCOUNTER — Other Ambulatory Visit: Payer: Self-pay | Admitting: Internal Medicine

## 2023-03-09 DIAGNOSIS — E1169 Type 2 diabetes mellitus with other specified complication: Secondary | ICD-10-CM

## 2023-03-10 ENCOUNTER — Other Ambulatory Visit: Payer: Self-pay

## 2023-03-10 ENCOUNTER — Other Ambulatory Visit (HOSPITAL_COMMUNITY): Payer: Self-pay

## 2023-03-10 MED ORDER — METFORMIN HCL 500 MG PO TABS
500.0000 mg | ORAL_TABLET | Freq: Every day | ORAL | 0 refills | Status: DC
Start: 2023-03-10 — End: 2023-06-29
  Filled 2023-03-10: qty 90, 90d supply, fill #0

## 2023-03-12 ENCOUNTER — Other Ambulatory Visit: Payer: Self-pay | Admitting: Internal Medicine

## 2023-03-12 DIAGNOSIS — I10 Essential (primary) hypertension: Secondary | ICD-10-CM

## 2023-03-12 NOTE — Telephone Encounter (Signed)
Requested Prescriptions  Pending Prescriptions Disp Refills   hydrochlorothiazide (HYDRODIURIL) 25 MG tablet [Pharmacy Med Name: HYDROCHLOROTHIAZIDE 25MG  TABLETS] 90 tablet 0    Sig: TAKE 1 TABLET(25 MG) BY MOUTH DAILY     Cardiovascular: Diuretics - Thiazide Failed - 03/12/2023 10:32 AM      Failed - Cr in normal range and within 180 days    Creat  Date Value Ref Range Status  12/05/2015 0.78 0.50 - 1.05 mg/dL Final    Comment:      For patients > or = 61 years of age: The upper reference limit for Creatinine is approximately 13% higher for people identified as African-American.      Creatinine, Ser  Date Value Ref Range Status  01/12/2023 1.32 (H) 0.57 - 1.00 mg/dL Final   Creatinine, Urine  Date Value Ref Range Status  12/07/2015 74 20 - 320 mg/dL Final         Passed - K in normal range and within 180 days    Potassium  Date Value Ref Range Status  01/12/2023 4.1 3.5 - 5.2 mmol/L Final         Passed - Na in normal range and within 180 days    Sodium  Date Value Ref Range Status  01/12/2023 139 134 - 144 mmol/L Final         Passed - Last BP in normal range    BP Readings from Last 1 Encounters:  01/12/23 112/78         Passed - Valid encounter within last 6 months    Recent Outpatient Visits           1 month ago Muscle cramps   [facility] [place]. [facility] Jonah Blue B, MD   9 months ago Type 2 diabetes mellitus with morbid obesity [facility])   [facility] [place]. [facility] Jonah Blue B, MD   1 year ago Type 2 diabetes mellitus with morbid obesity [facility])   [facility] [place] Marcine Matar, MD   1 year ago Hypertension associated with type 2 diabetes mellitus [facility])   [facility] [place]. [facility] Lois Huxley, [place] L, RPH-CPP   1 year ago Type 2  diabetes mellitus with morbid obesity (HCC)   [facility] - A Dept Of . [facility] Marcine Matar, MD

## 2023-04-22 LAB — HM DIABETES EYE EXAM

## 2023-05-11 ENCOUNTER — Other Ambulatory Visit: Payer: Self-pay | Admitting: Internal Medicine

## 2023-05-11 DIAGNOSIS — L304 Erythema intertrigo: Secondary | ICD-10-CM

## 2023-05-11 NOTE — Telephone Encounter (Signed)
 Requested medication (s) are due for refill today: Yes  Requested medication (s) are on the active medication list: Yes  Last refill:  01/12/23  Future visit scheduled: No  Notes to clinic:  Unable to refill due to no refill protocol for this medication.      Requested Prescriptions  Pending Prescriptions Disp Refills   NYSTATIN  powder [Pharmacy Med Name: NYSTOP  TOP PWDR 100,000 60GM] 60 g     Sig: APPLY UNDER ABDOMINAL FOLD AND UNDER THE BREASTS WHEN THERE IS A FLARE-UP DAILY AS DIRECTED     Off-Protocol Failed - 05/11/2023  1:24 PM      Failed - Medication not assigned to a protocol, review manually.      Passed - Valid encounter within last 12 months    Recent Outpatient Visits           3 months ago Muscle cramps   [facility] [place]. [facility] Concetta Dee B, MD   11 months ago Type 2 diabetes mellitus with morbid obesity [facility])   [place] [facility] - [facility] Of [place]. [facility] Concetta Dee B, MD   1 year ago Type 2 diabetes mellitus with morbid obesity [facility])   [place] [facility] - [facility] Of [place]. [facility] Lawrance Presume, MD   1 year ago Hypertension associated with type 2 diabetes mellitus [facility])   [place] [facility] - [facility] Dept Of [place]. [facility] Freada Jacobs, [place] L, RPH-CPP   1 year ago Type 2 diabetes mellitus with morbid obesity (HCC)   [place] [facility] - A Dept Of . [facility] Lawrance Presume, MD

## 2023-05-13 ENCOUNTER — Other Ambulatory Visit: Payer: Self-pay

## 2023-05-13 MED ORDER — RSVPREF3 VAC RECOMB ADJUVANTED 120 MCG/0.5ML IM SUSR
0.5000 mL | Freq: Once | INTRAMUSCULAR | 0 refills | Status: AC
  Filled 2023-05-13: qty 1, 1d supply, fill #0

## 2023-06-18 ENCOUNTER — Ambulatory Visit (HOSPITAL_COMMUNITY)
Admission: EM | Admit: 2023-06-18 | Discharge: 2023-06-18 | Disposition: A | Discharge status: Home / Self Care | Payer: MEDICAID | Attending: Emergency Medicine | Admitting: Emergency Medicine

## 2023-06-18 ENCOUNTER — Encounter (HOSPITAL_COMMUNITY): Payer: Self-pay | Admitting: Emergency Medicine

## 2023-06-18 DIAGNOSIS — R053 Chronic cough: Secondary | ICD-10-CM | POA: Diagnosis not present

## 2023-06-18 MED ORDER — BENZONATATE 100 MG PO CAPS: 100.0000 mg | ORAL_CAPSULE | Freq: Three times a day (TID) | ORAL | 0 refills | Status: DC
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
## 2023-06-18 NOTE — Discharge Instructions (Addendum)
 I have prescribed Tessalon that you can try again to see if it helps with your cough.  You can take this every 8 hours as needed. I recommend restarting prescribed inhalers and nasal sprays as advised by your allergy specialist. If your cough persist I recommend following up with your allergy specialist for further evaluation and management. Return here as needed.

## 2023-06-18 NOTE — ED Triage Notes (Addendum)
 Pt has chronic cough. Reports sees an allergist and has inhalers. The cough got worse during the night and was unable to go to work this morning due to coughing so much and so hard that was making her head hurt. Reports cough can be productive which is rare but mainly dry.

## 2023-06-24 LAB — AMB RESULTS CONSOLE CBG: Glucose: 101

## 2023-06-24 NOTE — Progress Notes (Signed)
 Pt screened for HtN and non fasting blood glucose, SDOH needs. Blood pressure found to be elevated. Educated on healthy life style and healthy eating habits. Encouraged pt to follow up withPCP and to keep a BP log

## 2023-06-29 ENCOUNTER — Other Ambulatory Visit: Payer: Self-pay | Admitting: Internal Medicine

## 2023-06-29 DIAGNOSIS — E1169 Type 2 diabetes mellitus with other specified complication: Secondary | ICD-10-CM

## 2023-06-30 ENCOUNTER — Other Ambulatory Visit (HOSPITAL_COMMUNITY): Payer: Self-pay

## 2023-06-30 MED ORDER — METFORMIN HCL 500 MG PO TABS
500.0000 mg | ORAL_TABLET | Freq: Every day | ORAL | 0 refills | Status: DC
Start: 2023-06-30 — End: 2023-09-23
  Filled 2023-06-30: qty 30, 30d supply, fill #0
  Filled 2023-08-18 (×2): qty 2, 2d supply, fill #1

## 2023-07-02 ENCOUNTER — Other Ambulatory Visit (HOSPITAL_COMMUNITY): Payer: Self-pay

## 2023-07-02 ENCOUNTER — Encounter: Payer: Self-pay | Admitting: Allergy

## 2023-07-02 ENCOUNTER — Ambulatory Visit (INDEPENDENT_AMBULATORY_CARE_PROVIDER_SITE_OTHER): Payer: MEDICAID | Admitting: Allergy

## 2023-07-02 ENCOUNTER — Other Ambulatory Visit: Payer: Self-pay

## 2023-07-02 ENCOUNTER — Other Ambulatory Visit: Payer: Self-pay | Admitting: Internal Medicine

## 2023-07-02 VITALS — BP 130/82 | HR 60 | Temp 97.4°F | Resp 16

## 2023-07-02 DIAGNOSIS — K219 Gastro-esophageal reflux disease without esophagitis: Secondary | ICD-10-CM

## 2023-07-02 DIAGNOSIS — J358 Other chronic diseases of tonsils and adenoids: Secondary | ICD-10-CM | POA: Diagnosis not present

## 2023-07-02 DIAGNOSIS — J3089 Other allergic rhinitis: Secondary | ICD-10-CM | POA: Diagnosis not present

## 2023-07-02 DIAGNOSIS — L299 Pruritus, unspecified: Secondary | ICD-10-CM

## 2023-07-02 DIAGNOSIS — J45991 Cough variant asthma: Secondary | ICD-10-CM

## 2023-07-02 DIAGNOSIS — H1013 Acute atopic conjunctivitis, bilateral: Secondary | ICD-10-CM | POA: Diagnosis not present

## 2023-07-02 MED ORDER — BUDESONIDE-FORMOTEROL FUMARATE 160-4.5 MCG/ACT IN AERO: 2.0000 | INHALATION_SPRAY | Freq: Two times a day (BID) | RESPIRATORY_TRACT | 5 refills | Status: DC

## 2023-07-02 MED ORDER — ALBUTEROL SULFATE HFA 108 (90 BASE) MCG/ACT IN AERS: 2.0000 | INHALATION_SPRAY | Freq: Four times a day (QID) | RESPIRATORY_TRACT | 1 refills | Status: DC | PRN

## 2023-07-02 MED ORDER — EPINEPHRINE 0.3 MG/0.3ML IJ SOAJ: 0.3000 mg | INTRAMUSCULAR | 1 refills | Status: DC | PRN

## 2023-07-02 MED ORDER — CETIRIZINE HCL 10 MG PO TABS: 10.0000 mg | ORAL_TABLET | Freq: Two times a day (BID) | ORAL | 5 refills | Status: DC

## 2023-07-02 MED ORDER — AZELASTINE HCL 0.1 % NA SOLN: 1.0000 | Freq: Two times a day (BID) | NASAL | 5 refills | Status: DC

## 2023-07-02 MED ORDER — FLUTICASONE PROPIONATE 50 MCG/ACT NA SUSP: 2.0000 | Freq: Every day | NASAL | 5 refills | Status: AC

## 2023-07-02 NOTE — Patient Instructions (Addendum)
 Allergic rhinitis with conjunctivitis - continue avoidance measures for grass pollen, weed pollen, tree pollen, dust mite, cockroach, cat, and dog.  #1 - Use azelastine (Astelin) blue nasal spray 2 sprays each nostril twice daily for runny nose/nasal drainage.  This will help decrease your cough - Use saline rinse prior to nasal spray use.  Use distilled water or boil water and bring to room temperature (do not use tap water).  - Use fluticasone nasal spray green nasal spray 2 sprays each nostril once a day as needed for stuffy nose - Can take Zyrtec 1 tab daily.  May take additional dosing if needed.   Cough variant asthma - lung function testing looks great today! - have access to albuterol inhaler 2 puffs via spacer or albuterol 1 vial via nebulizer every 4-6 hours as needed for cough/wheeze/shortness of breath/chest tightness.  May use 15-20 minutes prior to activity.   Monitor frequency of use.   - use pump inhaler with spacer device (plastic tube).   #2 - use Symbicort 160/4.5 mcg 2 puffs twice a day with spacer. Use this every day for maintenance control of symptom.   Asthma control goals:  Full participation in all desired activities (may need albuterol before activity) Albuterol use two time or less a week on average (not counting use with activity) Cough interfering with sleep two time or less a month Oral steroids no more than once a year No hospitalizations  GERD - can use omeprazole daily for reflux symptom control  Itching - Continue to avoid foods that cause itching - Blood work to tomato and lemon were positive. Recommend avoiding these foods along with other foods that cause itching/ rash and see if this helps. Have access to Epipen 0.3 mg in case of allergic reaction - Testing to orange was negative.  Tonsil stones - No stones on exam today #3 - Gargle with salt-water warm solution and use your mouthwash daily to help decrease production of tonsil stones  Follow-up  in 6 months or sooner if needed

## 2023-07-02 NOTE — Progress Notes (Signed)
 Follow-up Note  RE: Sabrina [name] MRN: [id number] DOB: [birth year] Date of Office Visit: 07/02/2023   History of present illness: Sabrina [name] is a 63 y.o. female presenting today for follow-up of allergic rhinitis with conjunctivitis, cough variant asthma, GERD, pruritus, tonsil stones.  She was last seen in the office on 01/01/23 by myself. Discussed the use of AI scribe software for clinical note transcription with the patient, who gave verbal consent to proceed.  She experiences nasal drainage that exacerbates her cough. She has azelastine nasal spray to use although she finds nasal sprays difficult to use as she states it does not spray.  It was uncovered again that she was not taking the clip off the nasal valve that allows her to spray.  I did have her administer her nasal spray in the nose in the office and taught her proper technique today as well.  She also has Flonase at home however she is not using this as well.  She is also supposed to be taking Zyrtec on a daily basis as well.  She has a history of cough variant asthma and has Symbicort but does not use this regularly as a maintenance inhaler.   She experiences episodes of coughing up hard, white plaques and she is not sure what this is and is concerned that it is coming from her chest.  She inquires about the impact of her weight on her breathing. She does not smoke but was exposed to secondhand smoke from her mother, who was a smoker.  She is concerned about the social implications of her cough, particularly in public settings like church, where she feels self-conscious.     Review of systems: 10pt ROS negative unless noted above in HPI  Past medical/social/surgical/family history have been reviewed and are unchanged unless specifically indicated below.  No changes  Medication List: Current Outpatient Medications  Medication Sig Dispense Refill   aspirin EC 81 MG tablet Take 1 tablet (81 mg total)  by mouth daily. For heart health     azelastine (ASTELIN) 0.1 % nasal spray Place 1-2 sprays into both nostrils 2 (two) times daily. 30 mL 5   benzonatate (TESSALON) 100 MG capsule Take 1 capsule (100 mg total) by mouth every 8 (eight) hours. 21 capsule 0   Blood Glucose Monitoring Suppl (TRUE METRIX METER) w/Device KIT Use as directed 1 kit 0   cetirizine (ZYRTEC) 10 MG tablet Take 1 tablet (10 mg total) by mouth 2 (two) times daily. 60 tablet 5   divalproex (DEPAKOTE ER) 250 MG 24 hr tablet Take 3 tablets (750 mg total) by mouth at bedtime. For mood stabilization 90 tablet 0   glucose blood (TRUE METRIX BLOOD GLUCOSE TEST) test strip Use as instructed 100 each 12   hydrochlorothiazide (HYDRODIURIL) 25 MG tablet TAKE 1 TABLET(25 MG) BY MOUTH DAILY 90 tablet 1   ibuprofen (ADVIL) 600 MG tablet Take 1 tablet (600 mg total) by mouth every 8 (eight) hours as needed. 30 tablet 0   metFORMIN (GLUCOPHAGE) 500 MG tablet Take 1 tablet (500 mg total) by mouth daily with breakfast. 30 tablet 0   nystatin powder APPLY UNDER ABDOMINAL FOLD AND UNDER THE BREASTS WHEN THERE IS A FLARE-UP DAILY AS DIRECTED 60 g 0   solifenacin (VESICARE) 10 MG tablet TAKE 1 TABLET(10 MG) BY MOUTH DAILY 90 tablet 1   Spacer/Aero-Holding Chambers DEVI Use Spacer with Albuterol inhaler as needed 1 Container 0   albuterol (VENTOLIN HFA) 108 (90  Base) MCG/ACT inhaler Inhale 2 puffs into the lungs every 6 (six) hours as needed for wheezing or shortness of breath. (Patient not taking: Reported on 07/02/2023) 18 g 1   atorvastatin (LIPITOR) 10 MG tablet Take 1 tablet (10 mg total) by mouth daily. (Patient not taking: Reported on 07/02/2023) 90 tablet 0   budesonide-formoterol (SYMBICORT) 160-4.5 MCG/ACT inhaler Inhale 2 puffs into the lungs 2 (two) times daily. Rinse mouth after use. (Patient not taking: Reported on 07/02/2023) 10.2 g 5   diclofenac Sodium (VOLTAREN) 1 % GEL Apply 2 g topically 4 (four) times daily. (Patient not taking: Reported  on 07/02/2023) 100 g 1   fluticasone (FLONASE) 50 MCG/ACT nasal spray Place 2 sprays into both nostrils daily. (Patient not taking: Reported on 07/02/2023) 18.2 g 5   triamcinolone cream (KENALOG) 0.1 % Apply 1 Application topically daily as needed (to itchy areas on back.). (Patient not taking: Reported on 07/02/2023) 30 g 0   TRUEPLUS LANCETS 28G MISC Use as directed (Patient not taking: Reported on 07/02/2023) 100 each 6   No current facility-administered medications for this visit.     Known medication allergies: Allergies  Allergen Reactions   Benzonatate Other (See Comments)    Made her cough worse   Latex Dermatitis and Other (See Comments)    Hands became scaly   Losartan Cough   Penicillins Other (See Comments)    Did it involve swelling of the face/tongue/throat, SOB, or low BP? Unk Did it involve sudden or severe rash/hives, skin peeling, or any reaction on the inside of your mouth or nose? Unk Did you need to seek medical attention at a hospital or doctor's office? Unk When did it last happen? "I was little; I don't remember, but my parents told me to never take it."  If all above answers are "NO", may proceed with cephalosporin use.     Lisinopril Cough   Oxycodone-Acetaminophen Nausea And Vomiting    Pt thinks she may be able to take with food (??)     Physical examination: Blood pressure 130/82, pulse 60, temperature (!) 97.4 F (36.3 C), temperature source Temporal, resp. rate 16, SpO2 97%.  General: Alert, interactive, in no acute distress. HEENT: PERRLA, TMs pearly gray, turbinates non-edematous without discharge, post-pharynx non erythematous.  **Nasal exam done after she administered azelastine in office** Neck: Supple without lymphadenopathy. Lungs: Clear to auscultation without wheezing, rhonchi or rales. {no increased work of breathing. CV: Normal S1, S2 without murmurs. Abdomen: Nondistended, nontender. Skin: Warm and dry, without lesions or  rashes. Extremities:  No clubbing, cyanosis or edema. Neuro:   Grossly intact.  Diagnositics/Labs: None today   Assessment and plan:   Allergic rhinitis with conjunctivitis - continue avoidance measures for grass pollen, weed pollen, tree pollen, dust mite, cockroach, cat, and dog.  #1 - Use azelastine (Astelin) blue nasal spray 2 sprays each nostril twice daily for runny nose/nasal drainage.  This will help decrease your cough - Use saline rinse prior to nasal spray use.  Use distilled water or boil water and bring to room temperature (do not use tap water).  - Use fluticasone nasal spray green nasal spray 2 sprays each nostril once a day as needed for stuffy nose - Can take Zyrtec 1 tab daily.  May take additional dosing if needed.   Cough variant asthma - lung function testing looks great today! - have access to albuterol inhaler 2 puffs via spacer or albuterol 1 vial via nebulizer every 4-6 hours as  needed for cough/wheeze/shortness of breath/chest tightness.  May use 15-20 minutes prior to activity.   Monitor frequency of use.   - use pump inhaler with spacer device (plastic tube).   #2 - use Symbicort 160/4.5 mcg 2 puffs twice a day with spacer. Use this every day for maintenance control of symptom.   - Will plan to obtain spirometry at next visit Asthma control goals:  Full participation in all desired activities (may need albuterol before activity) Albuterol use two time or less a week on average (not counting use with activity) Cough interfering with sleep two time or less a month Oral steroids no more than once a year No hospitalizations  GERD - can use omeprazole daily for reflux symptom control  Itching - Continue to avoid foods that cause itching - Blood work to tomato and lemon were positive. Recommend avoiding these foods along with other foods that cause itching/ rash and see if this helps. Have access to Epipen 0.3 mg in case of allergic reaction - Testing to  orange was negative.  Tonsil stones - No stones on exam today. -Discussed the hard white plaque that she coughed up was likely a tonsil stone #3 - Gargle with salt-water warm solution and use your mouthwash daily to help decrease production of tonsil stones  Follow-up in 6 months or sooner if needed  I appreciate the opportunity to take part in Sabrina [name]'s care. Please do not hesitate to contact me with questions.  Sincerely,   Margo Aye, MD Allergy/Immunology [facility]

## 2023-07-03 ENCOUNTER — Other Ambulatory Visit: Payer: Self-pay

## 2023-07-03 ENCOUNTER — Other Ambulatory Visit (HOSPITAL_COMMUNITY): Payer: Self-pay

## 2023-07-03 ENCOUNTER — Telehealth: Payer: Self-pay

## 2023-07-03 MED ORDER — BUDESONIDE-FORMOTEROL FUMARATE 160-4.5 MCG/ACT IN AERO: 2.0000 | INHALATION_SPRAY | Freq: Two times a day (BID) | RESPIRATORY_TRACT | 5 refills | Status: DC

## 2023-07-03 NOTE — Telephone Encounter (Signed)
*  Asthma/Allergy  Pharmacy Patient Advocate Encounter   Received notification from CoverMyMeds that prior authorization for Budesonide-Formoterol Fumarate 160-4.5MCG/ACT aerosol  is required/requested.   Insurance verification completed.   The patient is insured through [facility] .   Per test claim: The current 30 day co-pay is, $4.00.  No PA needed at this time. This test claim was processed through [facility]- copay amounts may vary at other pharmacies due to pharmacy/plan contracts, or as the patient moves through the different stages of their insurance plan.     *Brand Symbicort preferred

## 2023-07-03 NOTE — Telephone Encounter (Signed)
 Requested Prescriptions  Pending Prescriptions Disp Refills   metFORMIN (GLUCOPHAGE) 500 MG tablet 30 tablet 0    Sig: Take 1 tablet (500 mg total) by mouth daily with breakfast.     Endocrinology:  Diabetes - Biguanides Failed - 07/03/2023 11:30 AM      Failed - Cr in normal range and within 360 days    Creat  Date Value Ref Range Status  12/05/2015 0.78 0.50 - 1.05 mg/dL Final    Comment:      For patients > or = 62 years of age: The upper reference limit for Creatinine is approximately 13% higher for people identified as African-American.      Creatinine, Ser  Date Value Ref Range Status  01/12/2023 1.32 (H) 0.57 - 1.00 mg/dL Final   Creatinine, Urine  Date Value Ref Range Status  12/07/2015 74 20 - 320 mg/dL Final         Failed - eGFR in normal range and within 360 days    GFR, Est African American  Date Value Ref Range Status  10/11/2015 77 >=60 mL/min Final   GFR calc Af Amer  Date Value Ref Range Status  12/13/2019 92 >59 mL/min/1.73 Final    Comment:    **[facility] currently reports eGFR in compliance with the current**   recommendations of the [employer]. [facility] will   update reporting as new guidelines are published from the NKF-ASN   Task force.    GFR, Est Non African American  Date Value Ref Range Status  10/11/2015 67 >=60 mL/min Final   GFR, Estimated  Date Value Ref Range Status  11/03/2021 >60 >60 mL/min Final    Comment:    (NOTE) Calculated using the CKD-EPI Creatinine Equation (2021)    GFR  Date Value Ref Range Status  11/10/2014 90.01 >60.00 mL/min Final   eGFR  Date Value Ref Range Status  01/12/2023 46 (L) >59 mL/min/1.73 Final         Failed - B12 Level in normal range and within 720 days    Vitamin B-12  Date Value Ref Range Status  12/13/2019 981 232 - 1,245 pg/mL Final         Failed - CBC within normal limits and completed in the last 12 months    WBC  Date Value Ref Range Status  01/12/2023 7.8 3.4 -  10.8 x10E3/uL Final  11/03/2021 7.4 4.0 - 10.5 K/uL Final   RBC  Date Value Ref Range Status  01/12/2023 4.14 3.77 - 5.28 x10E6/uL Final  11/03/2021 3.90 3.87 - 5.11 MIL/uL Final   Hemoglobin  Date Value Ref Range Status  01/12/2023 13.2 11.1 - 15.9 g/dL Final   Hematocrit  Date Value Ref Range Status  01/12/2023 40.3 34.0 - 46.6 % Final   MCHC  Date Value Ref Range Status  01/12/2023 32.8 31.5 - 35.7 g/dL Final  81/19/1478 29.5 30.0 - 36.0 g/dL Final   [facility]  Date Value Ref Range Status  01/12/2023 31.9 26.6 - 33.0 pg Final  11/03/2021 32.1 26.0 - 34.0 pg Final   MCV  Date Value Ref Range Status  01/12/2023 97 79 - 97 fL Final   No results found for: "PLTCOUNTKUC", "LABPLAT", "POCPLA" RDW  Date Value Ref Range Status  01/12/2023 12.9 11.7 - 15.4 % Final         Passed - HBA1C is between 0 and 7.9 and within 180 days    HbA1c, POC (prediabetic range)  Date Value Ref Range  Status  03/15/2018 5.7 5.7 - 6.4 % Final   HbA1c, POC (controlled diabetic range)  Date Value Ref Range Status  01/12/2023 6.0 0.0 - 7.0 % Final         Passed - Valid encounter within last 6 months    Recent Outpatient Visits           5 months ago Muscle cramps   [facility] [place]. [facility] Jonah Blue B, MD   1 year ago Type 2 diabetes mellitus with morbid obesity [facility])   [facility] [place]. [facility] Jonah Blue B, MD   1 year ago Type 2 diabetes mellitus with morbid obesity [facility])   [facility] - [facility] [place]. [facility] Marcine Matar, MD   1 year ago Hypertension associated with type 2 diabetes mellitus [facility])   [facility] - A Dept Of [place]. [facility] Lois Huxley, [place] L, RPH-CPP   1 year ago Type 2 diabetes mellitus with morbid obesity (HCC)   [facility] - [facility] Dept Of [place]. [facility] Marcine Matar, MD       Future Appointments             In 6 months Padgett, Pilar Grammes, MD [facility] at [facility]

## 2023-07-04 ENCOUNTER — Other Ambulatory Visit (HOSPITAL_COMMUNITY): Payer: Self-pay

## 2023-07-27 ENCOUNTER — Ambulatory Visit: Payer: Self-pay

## 2023-07-27 ENCOUNTER — Ambulatory Visit (INDEPENDENT_AMBULATORY_CARE_PROVIDER_SITE_OTHER): Payer: MEDICAID | Admitting: Primary Care

## 2023-07-27 ENCOUNTER — Encounter (INDEPENDENT_AMBULATORY_CARE_PROVIDER_SITE_OTHER): Payer: Self-pay | Admitting: Primary Care

## 2023-07-27 VITALS — BP 134/82 | HR 85 | Resp 16 | Wt 273.6 lb

## 2023-07-27 DIAGNOSIS — R053 Chronic cough: Secondary | ICD-10-CM | POA: Diagnosis not present

## 2023-07-27 DIAGNOSIS — J301 Allergic rhinitis due to pollen: Secondary | ICD-10-CM | POA: Diagnosis not present

## 2023-07-27 DIAGNOSIS — R0981 Nasal congestion: Secondary | ICD-10-CM

## 2023-07-27 NOTE — Telephone Encounter (Signed)
 Patient seen today by a provider.

## 2023-07-27 NOTE — Telephone Encounter (Signed)
 Copied from CRM [id number]. Topic: Clinical - Red Word Triage >> Jul 27, 2023  1:46 PM Phil Braun wrote: Red Word that prompted transfer to Nurse Triage:   Pt is having covid like symptoims, no taste or smell, chills, congestion, runny nose, really bad headaches, Sick for 3 days,    Chief Complaint: Cough, chills, headache, runny nose., loss of taste, smell. Has not done COVID test. Symptoms: Above Frequency: 3 days ago Pertinent Negatives: Patient denies fever Disposition: [] ED /[] Urgent Care (no appt availability in office) / [x] Appointment(In office/virtual)/ []  [facility]/ [] Home Care/ [] Refused Recommended Disposition /[] [facility]/ []  Follow-up with PCP Additional Notes: Agrees with appointment.  Reason for Disposition  [1] MILD difficulty breathing (e.g., minimal/no SOB at rest, SOB with walking, pulse <100) AND [2] still present when not coughing  Answer Assessment - Initial Assessment Questions 1. ONSET: "When did the cough begin?"      3 days 2. SEVERITY: "How bad is the cough today?"      Moderate 3. SPUTUM: "Describe the color of your sputum" (none, dry cough; clear, white, yellow, green)     Unsure 4. HEMOPTYSIS: "Are you coughing up any blood?" If so ask: "How much?" (flecks, streaks, tablespoons, etc.)     No 5. DIFFICULTY BREATHING: "Are you having difficulty breathing?" If Yes, ask: "How bad is it?" (e.g., mild, moderate, severe)    - MILD: No SOB at rest, mild SOB with walking, speaks normally in sentences, can lie down, no retractions, pulse < 100.    - MODERATE: SOB at rest, SOB with minimal exertion and prefers to sit, cannot lie down flat, speaks in phrases, mild retractions, audible wheezing, pulse 100-120.    - SEVERE: Very SOB at rest, speaks in single words, struggling to breathe, sitting hunched forward, retractions, pulse > 120      Mild 6. FEVER: "Do you have a fever?" If Yes, ask: "What is your temperature, how was it measured, and when  did it start?"     Chills 7. CARDIAC HISTORY: "Do you have any history of heart disease?" (e.g., heart attack, congestive heart failure)      No 8. LUNG HISTORY: "Do you have any history of lung disease?"  (e.g., pulmonary embolus, asthma, emphysema)     Yes 9. PE RISK FACTORS: "Do you have a history of blood clots?" (or: recent major surgery, recent prolonged travel, bedridden)     no 10. OTHER SYMPTOMS: "Do you have any other symptoms?" (e.g., runny nose, wheezing, chest pain)       Runny nose 11. PREGNANCY: "Is there any chance you are pregnant?" "When was your last menstrual period?"       no 12. TRAVEL: "Have you traveled out of the country in the last month?" (e.g., travel history, exposures)       no  Protocols used: Cough - Acute Productive-A-AH

## 2023-07-27 NOTE — Progress Notes (Signed)
 [facility]  Sabrina [name], is a 63 y.o. female  [id number]  [id number]  DOB - 1960/12/12  CC- cough  Subjective:   Sabrina [name] is a 63 y.o. female here today for an acute visit. Patient  c/o Cough , congestion , itching watery eyes , fever ( did not take temperature) sweating and chills. Headache frontal. OTC Tylenol  h/a remained.  Patient noted that her husband had the same signs and symptoms prior he did not seek care.   No problems updated.  Comprehensive ROS Pertinent positive and negative noted in HPI   Allergies  Allergen Reactions   Benzonatate  Other (See Comments)    Made her cough worse   Latex Dermatitis and Other (See Comments)    Hands became scaly   Losartan  Cough   Penicillins Other (See Comments)    Did it involve swelling of the face/tongue/throat, SOB, or low BP? Unk Did it involve sudden or severe rash/hives, skin peeling, or any reaction on the inside of your mouth or nose? Unk Did you need to seek medical attention at a hospital or doctor's office? Unk When did it last happen? "I was little; I don't remember, but my parents told me to never take it."  If all above answers are "NO", may proceed with cephalosporin use.     Lisinopril  Cough   Oxycodone -Acetaminophen  Nausea And Vomiting    Pt thinks she may be able to take with food (??)    Past Medical History:  Diagnosis Date   Anxiety    Arthritis    "legs, knees, hands" (11/16/2014)   Bipolar disorder (HCC)    2 breakdowns - 1998, 2000 had to be hospitalized, [facility] at [facility]   Breast cancer [facility])    Carpal tunnel syndrome 07/22/2021   Chronic bronchitis (HCC)    "get it q yr"   Chronic lower back pain    Cough variant asthma 04/26/2020   Depression    GERD (gastroesophageal reflux disease)    Gout    Hyperlipidemia LDL goal < 100    "not on RX" (11/15/2014)   Hypertension    Lactose intolerance    Migraine    "monthly" (11/16/2014)   Mixed restrictive and  obstructive lung disease (HCC)    Health serve chart suggests PFTs done 1/10   Morbid obesity with BMI of 40.0-44.9, adult (HCC)    Rheumatoid arthritis (HCC)    Health serve records indicate Rheumatoid   Seizures (HCC)    "might have had 1; I'm on depakote " (11/16/2014)   Type II diabetes mellitus (HCC)     Current Outpatient Medications on File Prior to Visit  Medication Sig Dispense Refill   albuterol  (VENTOLIN  HFA) 108 (90 Base) MCG/ACT inhaler Inhale 2 puffs into the lungs every 6 (six) hours as needed for wheezing or shortness of breath. 18 g 1   aspirin  EC 81 MG tablet Take 1 tablet (81 mg total) by mouth daily. For heart health     atorvastatin  (LIPITOR) 10 MG tablet Take 1 tablet (10 mg total) by mouth daily. (Patient not taking: Reported on 07/02/2023) 90 tablet 0   azelastine  (ASTELIN ) 0.1 % nasal spray Place 1-2 sprays into both nostrils 2 (two) times daily. 30 mL 5   benzonatate  (TESSALON ) 100 MG capsule Take 1 capsule (100 mg total) by mouth every 8 (eight) hours. 21 capsule 0   Blood Glucose Monitoring Suppl (TRUE METRIX METER) w/Device KIT Use as directed 1 kit 0   budesonide -formoterol  (SYMBICORT ) 160-4.5  MCG/ACT inhaler Inhale 2 puffs into the lungs 2 (two) times daily. Rinse mouth after use. 10.2 g 5   budesonide -formoterol  (SYMBICORT ) 160-4.5 MCG/ACT inhaler Inhale 2 puffs into the lungs 2 (two) times daily. 10.2 g 5   cetirizine  (ZYRTEC ) 10 MG tablet Take 1 tablet (10 mg total) by mouth 2 (two) times daily. 60 tablet 5   diclofenac  Sodium (VOLTAREN ) 1 % GEL Apply 2 g topically 4 (four) times daily. (Patient not taking: Reported on 07/02/2023) 100 g 1   divalproex  (DEPAKOTE  ER) 250 MG 24 hr tablet Take 3 tablets (750 mg total) by mouth at bedtime. For mood stabilization 90 tablet 0   EPINEPHrine  (EPIPEN  2-PAK) 0.3 mg/0.3 mL IJ SOAJ injection Inject 0.3 mg into the muscle as needed for anaphylaxis. 0.3 mL 1   fluticasone  (FLONASE ) 50 MCG/ACT nasal spray Place 2 sprays into both  nostrils daily. 18.2 g 5   glucose blood (TRUE METRIX BLOOD GLUCOSE TEST) test strip Use as instructed 100 each 12   hydrochlorothiazide  (HYDRODIURIL ) 25 MG tablet TAKE 1 TABLET(25 MG) BY MOUTH DAILY 90 tablet 1   ibuprofen  (ADVIL ) 600 MG tablet Take 1 tablet (600 mg total) by mouth every 8 (eight) hours as needed. 30 tablet 0   metFORMIN  (GLUCOPHAGE ) 500 MG tablet Take 1 tablet (500 mg total) by mouth daily with breakfast. 30 tablet 0   nystatin  powder APPLY UNDER ABDOMINAL FOLD AND UNDER THE BREASTS WHEN THERE IS A FLARE-UP DAILY AS DIRECTED 60 g 0   solifenacin  (VESICARE ) 10 MG tablet TAKE 1 TABLET(10 MG) BY MOUTH DAILY 90 tablet 1   Spacer/Aero-Holding Chambers DEVI Use Spacer with Albuterol  inhaler as needed 1 Container 0   triamcinolone  cream (KENALOG ) 0.1 % Apply 1 Application topically daily as needed (to itchy areas on back.). (Patient not taking: Reported on 07/02/2023) 30 g 0   TRUEPLUS LANCETS 28G MISC Use as directed (Patient not taking: Reported on 07/02/2023) 100 each 6   No current facility-administered medications on file prior to visit.   Health Maintenance  Topic Date Due   Pap with HPV screening  09/11/2022   Complete foot exam   10/05/2022   COVID-19 Vaccine (5 - 2024-25 season) 11/30/2022   Hemoglobin A1C  07/13/2023   Flu Shot  10/30/2023   Yearly kidney function blood test for diabetes  01/12/2024   Yearly kidney health urinalysis for diabetes  01/12/2024   Mammogram  04/11/2024   Eye exam for diabetics  04/21/2024   Colon Cancer Screening  08/07/2025   DTaP/Tdap/Td vaccine (3 - Td or Tdap) 10/05/2031   Pneumococcal Vaccination  Completed   Hepatitis C Screening  Completed   HIV Screening  Completed   Zoster (Shingles) Vaccine  Completed   HPV Vaccine  Aged Out   Meningitis B Vaccine  Aged Out    Objective:  BP 134/82 (BP Location: Left Arm, Patient Position: Sitting)   Pulse 85   Resp 16   Wt 273 lb 9.6 oz (124.1 kg)   SpO2 95%   BMI 48.47 kg/m     Physical Exam Vitals reviewed.  Constitutional:      Appearance: She is obese.     Comments: moebid  HENT:     Head: Normocephalic.     Right Ear: Tympanic membrane and external ear normal.     Left Ear: Tympanic membrane and external ear normal.     Ears:     Comments: Bilateral canal red tissue finger nails    Nose:  Comments: Bilateral boggy swollen turbinates left greater than right Eyes:     General:        Right eye: Discharge present.        Left eye: Discharge present.    Extraocular Movements: Extraocular movements intact.  Cardiovascular:     Rate and Rhythm: Normal rate and regular rhythm.  Pulmonary:     Effort: Pulmonary effort is normal.     Breath sounds: Normal breath sounds.  Abdominal:     General: Bowel sounds are normal. There is distension.     Palpations: Abdomen is soft.  Musculoskeletal:        General: Normal range of motion.     Cervical back: Normal range of motion and neck supple.  Skin:    General: Skin is warm and dry.  Neurological:     Mental Status: She is oriented to person, place, and time.  Psychiatric:        Mood and Affect: Mood normal.        Behavior: Behavior normal.       Assessment & Plan  Shadaisha was seen today for uri.  Diagnoses and all orders for this visit:  Seasonal allergic rhinitis due to pollen Allergy symptoms taking Zyrtec  daily  Nasal congestion 2/2 Chronic cough Chills, sweats, cough cold and congestion rule out respiratory origin -     Respiratory Panel w/ SARS-CoV2 Take Flonase  as directed  Patient have been counseled extensively about nutrition and exercise. Other issues discussed during this visit include: low cholesterol diet, weight control and daily exercise, foot care, annual eye examinations at Ophthalmology, importance of adherence with medications and regular follow-up. We also discussed long term complications of uncontrolled diabetes and hypertension.   Return for PCP.  The patient  was given clear instructions to go to ER or return to medical center if symptoms don't improve, worsen or new problems develop. The patient verbalized understanding. The patient was told to call to get lab results if they haven't heard anything in the next week.   This note has been created with Education officer, environmental. Any transcriptional errors are unintentional.   Marius Siemens, NP 07/27/2023, 3:40 PM

## 2023-07-27 NOTE — Addendum Note (Signed)
 Addended by: Madelyn Schick on: 07/27/2023 03:45 PM   Modules accepted: Orders

## 2023-07-28 ENCOUNTER — Other Ambulatory Visit (INDEPENDENT_AMBULATORY_CARE_PROVIDER_SITE_OTHER): Payer: Self-pay

## 2023-07-28 MED ORDER — NIRMATRELVIR/RITONAVIR (PAXLOVID)TABLET: 3.0000 | ORAL_TABLET | Freq: Two times a day (BID) | ORAL | 0 refills | Status: AC

## 2023-07-28 NOTE — Addendum Note (Signed)
 Addended by: Madelyn Schick on: 07/28/2023 08:01 PM   Modules accepted: Orders

## 2023-07-29 ENCOUNTER — Telehealth (INDEPENDENT_AMBULATORY_CARE_PROVIDER_SITE_OTHER): Payer: Self-pay

## 2023-07-29 ENCOUNTER — Encounter (INDEPENDENT_AMBULATORY_CARE_PROVIDER_SITE_OTHER): Payer: Self-pay | Admitting: Primary Care

## 2023-07-29 ENCOUNTER — Encounter (INDEPENDENT_AMBULATORY_CARE_PROVIDER_SITE_OTHER): Payer: Self-pay

## 2023-07-29 NOTE — Telephone Encounter (Signed)
 When received results from nurse on 07/28/23 my jabber on my laptop would not work. Sent in rx and made nurse aware. Pt will need to remain out of work for 5 days. Will provide patient a work note and will have nurse follow up with pt during her recovery.

## 2023-07-29 NOTE — Telephone Encounter (Signed)
 Tried reaching out to pt in regards of results pt phone kept going to vm. Reached out to pt husband and asked if he is able to get in touch with pt and have her give us  a call. Pt husbad stated he will reach out to us  and have her call. Pt returned call and I have made pt aware of positive COVID results. Pt states she is at work and what should she do. Made pt aware that she will need to reach out to her supervisor in regards to do with the work [employer]chant [employer] officer. Pt is requesting a note. Made awrae that provider is going to provide a work note for her. Informed pt that medication was sent to [facility] on [address].  Pt states she appreciates everything and she will come by to pick up work note and she will pick up medication as well  Pt doesn't have any other questions or concerns

## 2023-07-29 NOTE — Progress Notes (Signed)
 The patient attended a screening event on 06/24/2023, where her blood pressure was measured at 142/84 mmHg, and her blood glucose was 101 mg/dl. During the event, the patient reported that she does not smoke, has [employer], is established with a primary care provider (Dr. Concetta Dee), and she does not have any documented SDOH needs.  A chart review confirmed that the patient has an active PCP, Dr. Concetta Dee with [facility]. The pt was last seen by her PCP on 01/12/2024. The pt currently does not have any [facility] upcoming appts with this provider. Pt has obtained refills since the screening event. The pt has also seen her Allergy specialist since the screening event. She has an upcoming appt on 01/08/24 at 3:20 PM with Dr. Thad Files at [facility]. She also had an appt with Madelyn Schick, NP at [facility] on 07/27/23. The pt has a housing SDOH need indicated for the day of the screening event but it was not documented on her screening form.   CHW called pt to determine if pt still has housing SDOH need. CHW left vm for pt to return call. CHW called pt to obtain SDOH need. CHW left pt a vm. CHW called pt again to discuss housing SDOH, CHW left vm to pt to return call. CHW sent letter with housing resources.   An additional follow up will be done at a later date per the [employer] protocol.

## 2023-07-29 NOTE — Telephone Encounter (Signed)
 The patient called in returning [place] phone call. I transferred her to [place]

## 2023-07-29 NOTE — Telephone Encounter (Signed)
 Received results from [facility] in regards to patient respiratory panel results and pt is positive. Results came in yesterday 07/28/23 at 4:49pm.   Reached out to provider to make aware 07/28/23 at  5:20pm  Will forward to provider

## 2023-08-03 ENCOUNTER — Other Ambulatory Visit: Payer: Self-pay | Admitting: Internal Medicine

## 2023-08-03 ENCOUNTER — Other Ambulatory Visit (HOSPITAL_COMMUNITY): Payer: Self-pay

## 2023-08-03 DIAGNOSIS — E1169 Type 2 diabetes mellitus with other specified complication: Secondary | ICD-10-CM

## 2023-08-10 ENCOUNTER — Other Ambulatory Visit (HOSPITAL_COMMUNITY): Payer: Self-pay

## 2023-08-18 ENCOUNTER — Other Ambulatory Visit: Payer: Self-pay

## 2023-08-18 ENCOUNTER — Other Ambulatory Visit: Payer: Self-pay | Admitting: Internal Medicine

## 2023-08-18 ENCOUNTER — Other Ambulatory Visit (HOSPITAL_COMMUNITY): Payer: Self-pay

## 2023-08-18 DIAGNOSIS — E1169 Type 2 diabetes mellitus with other specified complication: Secondary | ICD-10-CM

## 2023-08-18 MED ORDER — ATORVASTATIN CALCIUM 10 MG PO TABS
10.0000 mg | ORAL_TABLET | Freq: Every day | ORAL | 0 refills | Status: DC
  Filled 2023-08-18: qty 30, 30d supply, fill #0

## 2023-08-25 ENCOUNTER — Other Ambulatory Visit: Payer: Self-pay | Admitting: Internal Medicine

## 2023-08-31 ENCOUNTER — Other Ambulatory Visit (HOSPITAL_COMMUNITY): Payer: Self-pay

## 2023-09-07 ENCOUNTER — Telehealth (INDEPENDENT_AMBULATORY_CARE_PROVIDER_SITE_OTHER): Payer: Self-pay | Admitting: Primary Care

## 2023-09-07 NOTE — Telephone Encounter (Signed)
 Called pt to confirm appt. Pt is out of medication. Pt wil be at appt.

## 2023-09-11 ENCOUNTER — Telehealth (INDEPENDENT_AMBULATORY_CARE_PROVIDER_SITE_OTHER): Payer: Self-pay | Admitting: Primary Care

## 2023-09-11 NOTE — Telephone Encounter (Signed)
 Called pt to confirm appt. Pt will be present.

## 2023-09-14 ENCOUNTER — Encounter (INDEPENDENT_AMBULATORY_CARE_PROVIDER_SITE_OTHER): Payer: Self-pay | Admitting: Primary Care

## 2023-09-14 ENCOUNTER — Ambulatory Visit (INDEPENDENT_AMBULATORY_CARE_PROVIDER_SITE_OTHER): Payer: MEDICAID | Admitting: Primary Care

## 2023-09-14 VITALS — BP 135/80 | HR 72 | Resp 16 | Wt 281.4 lb

## 2023-09-14 DIAGNOSIS — E66813 Obesity, class 3: Secondary | ICD-10-CM | POA: Diagnosis not present

## 2023-09-14 DIAGNOSIS — Z6841 Body Mass Index (BMI) 40.0 and over, adult: Secondary | ICD-10-CM | POA: Diagnosis not present

## 2023-09-14 DIAGNOSIS — R7303 Prediabetes: Secondary | ICD-10-CM

## 2023-09-14 LAB — POCT GLYCOSYLATED HEMOGLOBIN (HGB A1C): HbA1c, POC (controlled diabetic range): 5.8 % (ref 0.0–7.0)

## 2023-09-14 NOTE — Patient Instructions (Signed)
 Obesity, Adult Obesity is having too much body fat. Being obese means that your weight is more than what is healthy for you.  BMI (body mass index) is a number that explains how much body fat you have. If you have a BMI of 30 or more, you are obese. Obesity can cause serious health problems, such as: Stroke. Coronary artery disease (CAD). Type 2 diabetes. Some types of cancer. High blood pressure (hypertension). High cholesterol. Gallbladder stones. Obesity can also contribute to: Osteoarthritis. Sleep apnea. Infertility problems. What are the causes? Eating meals each day that are high in calories, sugar, and fat. Drinking a lot of drinks that have sugar in them. Being born with genes that may make you more likely to become obese. Having a medical condition that causes obesity. Taking certain medicines. Sitting a lot (having a sedentary lifestyle). Not getting enough sleep. What increases the risk? Having a family history of obesity. Living in an area with limited access to: [place], recreation centers, or sidewalks. Healthy food choices, such as grocery stores and farmers' markets. What are the signs or symptoms? The main sign is having too much body fat. How is this treated? Treatment for this condition often includes changing your lifestyle. Treatment may include: Changing your diet. This may include making a healthy meal plan. Exercise. This may include activity that causes your heart to beat faster (aerobic exercise) and strength training. Work with your doctor to design a program that works for you. Medicine to help you lose weight. This may be used if you are not able to lose one pound a week after 6 weeks of healthy eating and more exercise. Treating conditions that cause the obesity. Surgery. Options may include gastric banding and gastric bypass. This may be done if: Other treatments have not helped to improve your condition. You have a BMI of 40 or higher. You have  life-threatening health problems related to obesity. Follow these instructions at home: Eating and drinking  Follow advice from your doctor about what to eat and drink. Your doctor may tell you to: Limit fast food, sweets, and processed snack foods. Choose low-fat options. For example, choose low-fat milk instead of whole milk. Eat five or more servings of fruits or vegetables each day. Eat at home more often. This gives you more control over what you eat. Choose healthy foods when you eat out. Learn to read food labels. This will help you learn how much food is in one serving. Keep low-fat snacks available. Avoid drinks that have a lot of sugar in them. These include soda, fruit juice, iced tea with sugar, and flavored milk. Drink enough water to keep your pee (urine) pale yellow. Do not go on fad diets. Physical activity Exercise often, as told by your doctor. Most adults should get up to 150 minutes of moderate-intensity exercise every week.Ask your doctor: What types of exercise are safe for you. How often you should exercise. Warm up and stretch before being active. Do slow stretching after being active (cool down). Rest between times of being active. Lifestyle Work with your doctor and a food expert (dietitian) to set a weight-loss goal that is best for you. Limit your screen time. Find ways to reward yourself that do not involve food. Do not drink alcohol if: Your doctor tells you not to drink. You are pregnant, may be pregnant, or are planning to become pregnant. If you drink alcohol: Limit how much you have to: 0-1 drink a day for women. 0-2 drinks  a day for men. Know how much alcohol is in your drink. In the [place] one drink equals one 12 oz bottle of beer (355 mL), one 5 oz glass of wine (148 mL), or one 1 oz glass of hard liquor (44 mL). General instructions Keep a weight-loss journal. This can help you keep track of: The food that you eat. How much exercise you  get. Take over-the-counter and prescription medicines only as told by your doctor. Take vitamins and supplements only as told by your doctor. Think about joining a support group. Pay attention to your mental health as obesity can lead to depression or self esteem issues. Keep all follow-up visits. Contact a doctor if: You cannot meet your weight-loss goal after you have changed your diet and lifestyle for 6 weeks. You are having trouble breathing. Summary Obesity is having too much body fat. Being obese means that your weight is more than what is healthy for you. Work with your doctor to set a weight-loss goal. Get regular exercise as told by your doctor. This information is not intended to replace advice given to you by your health care provider. Make sure you discuss any questions you have with your health care provider. Document Revised: 10/23/2020 Document Reviewed: 10/23/2020 Elsevier Patient Education  2024 [employer].

## 2023-09-14 NOTE — Progress Notes (Signed)
 [facility]  Sabrina [name], is a 63 y.o. female  [id number]:[id number]  [id number]  DOB - Oct 22, 1960  Chief Complaint  Patient presents with   Medication Refill       Subjective:   Sabrina [name] is a 63 y.o. female here today for an acute visit.  Patient presents today for A1c due to primary care doctor did not refill her metformin  until seen.  She has been out of her metformin  for at least 2 months.  A1c is 5.8.  Explained to patient prediabetes is 5.7-6.4.  Does not need to be on medication for treatment of diabetes however she can benefit from lifestyle modifications monitoring carbohydrates, sodas, sweets and exercise.    No problems updated.  Comprehensive ROS Pertinent positive and negative noted in HPI   Allergies  Allergen Reactions   Benzonatate  Other (See Comments)    Made her cough worse   Latex Dermatitis and Other (See Comments)    Hands became scaly   Losartan  Cough   Penicillins Other (See Comments)    Did it involve swelling of the face/tongue/throat, SOB, or low BP? Unk Did it involve sudden or severe rash/hives, skin peeling, or any reaction on the inside of your mouth or nose? Unk Did you need to seek medical attention at a hospital or doctor's office? Unk When did it last happen? I was little; I don't remember, but my parents told me to never take it.  If all above answers are "NO", may proceed with cephalosporin use.     Lisinopril  Cough   Oxycodone -Acetaminophen  Nausea And Vomiting    Pt thinks she may be able to take with food (??)    Past Medical History:  Diagnosis Date   Anxiety    Arthritis    legs, knees, hands (11/16/2014)   Bipolar disorder (HCC)    2 breakdowns - 1998, 2000 had to be hospitalized, followed at [facility]   Breast cancer [facility])    Carpal tunnel syndrome 07/22/2021   Chronic bronchitis (HCC)    get it q yr   Chronic lower back pain    Cough variant asthma 04/26/2020   Depression    GERD  (gastroesophageal reflux disease)    Gout    Hyperlipidemia LDL goal < 100    not on RX (11/15/2014)   Hypertension    Lactose intolerance    Migraine    monthly (11/16/2014)   Mixed restrictive and obstructive lung disease (HCC)    [facility] chart suggests PFTs done 1/10   Morbid obesity with BMI of 40.0-44.9, adult (HCC)    Rheumatoid arthritis (HCC)    [facility] records indicate Rheumatoid   Seizures (HCC)    might have had 1; I'm on depakote  (11/16/2014)   Type II diabetes mellitus (HCC)     Current Outpatient Medications on File Prior to Visit  Medication Sig Dispense Refill   albuterol  (VENTOLIN  HFA) 108 (90 Base) MCG/ACT inhaler Inhale 2 puffs into the lungs every 6 (six) hours as needed for wheezing or shortness of breath. 18 g 1   aspirin  EC 81 MG tablet Take 1 tablet (81 mg total) by mouth daily. For heart health     atorvastatin  (LIPITOR) 10 MG tablet Take 1 tablet (10 mg total) by mouth daily. Please schedule an appointment with Dr. Lincoln Renshaw for additional refills. 30 tablet 0   azelastine  (ASTELIN ) 0.1 % nasal spray Place 1-2 sprays into both nostrils 2 (two) times daily. 30 mL 5   benzonatate  (  TESSALON ) 100 MG capsule Take 1 capsule (100 mg total) by mouth every 8 (eight) hours. 21 capsule 0   Blood Glucose Monitoring Suppl (TRUE METRIX METER) w/Device KIT Use as directed 1 kit 0   budesonide -formoterol  (SYMBICORT ) 160-4.5 MCG/ACT inhaler Inhale 2 puffs into the lungs 2 (two) times daily. Rinse mouth after use. 10.2 g 5   budesonide -formoterol  (SYMBICORT ) 160-4.5 MCG/ACT inhaler Inhale 2 puffs into the lungs 2 (two) times daily. 10.2 g 5   cetirizine  (ZYRTEC ) 10 MG tablet Take 1 tablet (10 mg total) by mouth 2 (two) times daily. 60 tablet 5   diclofenac  Sodium (VOLTAREN ) 1 % GEL Apply 2 g topically 4 (four) times daily. (Patient not taking: Reported on 07/02/2023) 100 g 1   divalproex  (DEPAKOTE  ER) 250 MG 24 hr tablet Take 3 tablets (750 mg total) by mouth at  bedtime. For mood stabilization 90 tablet 0   EPINEPHrine  (EPIPEN  2-PAK) 0.3 mg/0.3 mL IJ SOAJ injection Inject 0.3 mg into the muscle as needed for anaphylaxis. 0.3 mL 1   fluticasone  (FLONASE ) 50 MCG/ACT nasal spray Place 2 sprays into both nostrils daily. 18.2 g 5   glucose blood (TRUE METRIX BLOOD GLUCOSE TEST) test strip Use as instructed 100 each 12   hydrochlorothiazide  (HYDRODIURIL ) 25 MG tablet TAKE 1 TABLET(25 MG) BY MOUTH DAILY 90 tablet 1   ibuprofen  (ADVIL ) 600 MG tablet Take 1 tablet (600 mg total) by mouth every 8 (eight) hours as needed. 30 tablet 0   metFORMIN  (GLUCOPHAGE ) 500 MG tablet Take 1 tablet (500 mg total) by mouth daily with breakfast. 30 tablet 0   nystatin  powder APPLY UNDER ABDOMINAL FOLD AND UNDER THE BREASTS WHEN THERE IS A FLARE-UP DAILY AS DIRECTED 60 g 0   solifenacin  (VESICARE ) 10 MG tablet TAKE 1 TABLET(10 MG) BY MOUTH DAILY 90 tablet 1   Spacer/Aero-Holding Chambers DEVI Use Spacer with Albuterol  inhaler as needed 1 Container 0   triamcinolone  cream (KENALOG ) 0.1 % Apply 1 Application topically daily as needed (to itchy areas on back.). (Patient not taking: Reported on 07/02/2023) 30 g 0   TRUEPLUS LANCETS 28G MISC Use as directed (Patient not taking: Reported on 07/02/2023) 100 each 6   No current facility-administered medications on file prior to visit.   Health Maintenance  Topic Date Due   Pap with HPV screening  09/11/2022   Complete foot exam   10/05/2022   COVID-19 Vaccine (5 - 2024-25 season) 11/30/2022   Flu Shot  10/30/2023   Yearly kidney function blood test for diabetes  01/12/2024   Yearly kidney health urinalysis for diabetes  01/12/2024   Hemoglobin A1C  03/15/2024   Mammogram  04/11/2024   Eye exam for diabetics  04/21/2024   Colon Cancer Screening  08/07/2025   DTaP/Tdap/Td vaccine (3 - Td or Tdap) 10/05/2031   Pneumococcal Vaccination  Completed   Hepatitis C Screening  Completed   HIV Screening  Completed   Zoster (Shingles) Vaccine   Completed   HPV Vaccine  Aged Out   Meningitis B Vaccine  Aged Out    Objective:   Vitals:   09/14/23 1657  BP: 135/80  Pulse: 72  Resp: 16  SpO2: 100%  Weight: 281 lb 6.4 oz (127.6 kg)   BP Readings from Last 3 Encounters:  09/14/23 135/80  07/27/23 134/82  07/02/23 130/82      Physical Exam Vitals reviewed.  Constitutional:      Appearance: Normal appearance. She is obese.     Comments:  Morbid obesity  HENT:     Head: Normocephalic.     Right Ear: Tympanic membrane, ear canal and external ear normal.     Left Ear: Tympanic membrane, ear canal and external ear normal.     Nose: Nose normal.     Mouth/Throat:     Mouth: Mucous membranes are moist.   Eyes:     Extraocular Movements: Extraocular movements intact.     Pupils: Pupils are equal, round, and reactive to light.    Cardiovascular:     Rate and Rhythm: Normal rate.  Pulmonary:     Effort: Pulmonary effort is normal.     Breath sounds: Normal breath sounds.  Abdominal:     General: Bowel sounds are normal.     Palpations: Abdomen is soft.   Musculoskeletal:        General: Normal range of motion.     Cervical back: Normal range of motion.   Skin:    General: Skin is warm and dry.   Neurological:     Mental Status: She is alert and oriented to person, place, and time.   Psychiatric:        Mood and Affect: Mood normal.        Behavior: Behavior normal.        Thought Content: Thought content normal.     Assessment & Plan  Nakema was seen today for medication refill.  Diagnoses and all orders for this visit:  Prediabetes See HPI -     POCT glycosylated hemoglobin (Hb A1C)  Class 3 severe obesity with serious comorbidity and body mass index (BMI) of 45.0 to 49.9 in adult Morbid Obesity is > 40 indicating an excess in caloric intake or underlining conditions. This may lead to other co-morbidities. Educated on lifestyle modifications of diet and exercise which may reduce obesity.    -      POCT glycosylated hemoglobin (Hb A1C)  Patient have been counseled extensively about nutrition and exercise. Other issues discussed during this visit include: low cholesterol diet, weight control and daily exercise, foot care, annual eye examinations at Ophthalmology, importance of adherence with medications and regular follow-up. We also discussed long term complications of uncontrolled diabetes and hypertension.   Return Follow up PCP.  The patient was given clear instructions to go to ER or return to medical center if symptoms don't improve, worsen or new problems develop. The patient verbalized understanding. The patient was told to call to get lab results if they haven't heard anything in the next week.   This note has been created with Education officer, environmental. Any transcriptional errors are unintentional.   Marius Siemens, NP 09/14/2023, 5:17 PM

## 2023-09-22 ENCOUNTER — Ambulatory Visit: Payer: Self-pay | Admitting: *Deleted

## 2023-09-22 NOTE — Telephone Encounter (Signed)
 Patient voiced  she is going to [facility] on tomorrow at [address]

## 2023-09-22 NOTE — Telephone Encounter (Signed)
 FYI Only or Action Required?: Action required by provider: request for appointment.  Patient was last seen in primary care on 09/14/2023 by Celestia Rosaline SQUIBB, NP. Called Nurse Triage reporting body pain. Symptoms began several days ago. Interventions attempted: Nothing. Symptoms are: gradually worsening.  Triage Disposition: See HCP Within 4 Hours (Or PCP Triage)  Patient/caregiver understands and will follow disposition? Patient is requesting appointment today- she works and called out today. Advised care options- but she wants to come to office if she can. Message sent for provider review     Reason for Disposition  [1] SEVERE pain (e.g., excruciating, unable to do any normal activities) AND [2] not improved 2 hours after pain medicine  Answer Assessment - Initial Assessment Questions 1. ONSET: When did the muscle aches or body pains start?      Worse over the weekend- 1 month 2. LOCATION: What part of your body is hurting? (e.g., entire body, arms, legs)      Back pain, left side-pelvis,thigh and lower back 3. SEVERITY: How bad is the pain? (Scale 1-10; or mild, moderate, severe)   - MILD (1-3): doesn't interfere with normal activities    - MODERATE (4-7): interferes with normal activities or awakens from sleep    - SEVERE (8-10):  excruciating pain, unable to do any normal activities      10/10 4. CAUSE: What do you think is causing the pains?     Patient states not sure- mattress, diabetes 5. FEVER: Have you been having fever?     no 6. OTHER SYMPTOMS: Do you have any other symptoms? (e.g., chest pain, weakness, rash, cold or flu symptoms, weight loss)     Persistent cough  8. TRAVEL: Have you traveled out of the country in the last month? 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 Topic: Clinical - Red Word Triage >> Sep 22, 2023 11:20 AM [place]  G wrote: Red Word that prompted transfer to Nurse Triage: severe pain pelvic, thigh, lower back and legs. ( 80month and getting worse)

## 2023-09-23 ENCOUNTER — Ambulatory Visit: Payer: MEDICAID | Admitting: Physician Assistant

## 2023-09-23 ENCOUNTER — Encounter: Payer: Self-pay | Admitting: Physician Assistant

## 2023-09-23 VITALS — BP 108/91 | HR 82 | Ht 64.0 in | Wt 280.0 lb

## 2023-09-23 DIAGNOSIS — M5442 Lumbago with sciatica, left side: Secondary | ICD-10-CM | POA: Diagnosis not present

## 2023-09-23 DIAGNOSIS — Z6841 Body Mass Index (BMI) 40.0 and over, adult: Secondary | ICD-10-CM

## 2023-09-23 DIAGNOSIS — E66813 Obesity, class 3: Secondary | ICD-10-CM

## 2023-09-23 DIAGNOSIS — M5441 Lumbago with sciatica, right side: Secondary | ICD-10-CM

## 2023-09-23 DIAGNOSIS — E1169 Type 2 diabetes mellitus with other specified complication: Secondary | ICD-10-CM

## 2023-09-23 DIAGNOSIS — Z7984 Long term (current) use of oral hypoglycemic drugs: Secondary | ICD-10-CM

## 2023-09-23 MED ORDER — METFORMIN HCL 500 MG PO TABS: 500.0000 mg | ORAL_TABLET | Freq: Every day | ORAL | 1 refills | Status: DC

## 2023-09-23 MED ORDER — CYCLOBENZAPRINE HCL 10 MG PO TABS: 10.0000 mg | ORAL_TABLET | Freq: Three times a day (TID) | ORAL | 0 refills | Status: DC | PRN

## 2023-09-23 MED ORDER — IBUPROFEN 600 MG PO TABS: 600.0000 mg | ORAL_TABLET | Freq: Three times a day (TID) | ORAL | 0 refills | Status: AC | PRN

## 2023-09-23 MED ORDER — METHYLPREDNISOLONE ACETATE 80 MG/ML IJ SUSP
80.0000 mg | Freq: Once | INTRAMUSCULAR | Status: AC
  Administered 2023-09-23: 80 mg via INTRAMUSCULAR

## 2023-09-23 MED ORDER — KETOROLAC TROMETHAMINE 60 MG/2ML IM SOLN
60.0000 mg | Freq: Once | INTRAMUSCULAR | Status: AC
  Administered 2023-09-23: 60 mg via INTRAMUSCULAR

## 2023-09-23 NOTE — Patient Instructions (Addendum)
 VISIT SUMMARY:  During your visit, we discussed your severe pelvic and leg pain, your diabetes management, and general health maintenance. We provided treatments to help alleviate your pain and reviewed your current diabetes management plan. We also discussed ways to improve your hydration and overall health.  YOUR PLAN:  -SCIATIC PAIN: Sciatic pain is caused by irritation or inflammation of the sciatic nerve, which can cause severe pain radiating from the lower back down to the leg. We administered a steroid injection and a pain relief injection to help reduce inflammation and pain. You are prescribed ibuprofen  600 mg for inflammation and a muscle relaxant. Please increase your water intake to four bottles daily and start stretching exercises once the pain subsides. If the pain persists, we may consider imaging and a referral to an orthopedic specialist.  -TYPE 2 DIABETES MELLITUS: Type 2 diabetes is a condition where your body does not use insulin  properly, leading to high blood sugar levels. Your diabetes is well-controlled with an A1c of 5.8%. We will continue your metformin  prescription . Please monitor your blood glucose levels regularly and follow up with Dr. Vicci on August 15th.  -GENERAL HEALTH MAINTENANCE: We discussed the importance of hydration and stretching to prevent musculoskeletal issues. Please drink at least four bottles of water daily and incorporate stretching exercises into your routine.   Sciatica  Sciatica is pain, numbness, weakness, or tingling along the path of the sciatic nerve. The sciatic nerve starts in the lower back and runs down the back of each leg. The nerve controls the muscles in the lower leg and in the back of the knee. It also provides feeling (sensation) to the back of the thigh, the lower leg, and the sole of the foot. Sciatica is a symptom of another medical condition that pinches or puts pressure on the sciatic nerve. Sciatica most often only affects one  side of the body. Sciatica usually goes away on its own or with treatment. In some cases, sciatica may come back (recur). What are the causes? This condition is caused by pressure on the sciatic nerve or pinching of the nerve. This may be the result of: A disk in between the bones of the spine bulging out too far (herniated disk). Age-related changes in the spinal disks. A pain disorder that affects a muscle in the buttock. Extra bone growth near the sciatic nerve. A break (fracture) of the pelvis. Pregnancy. Tumor. This is rare. What increases the risk? The following factors may make you more likely to develop this condition: Playing sports that place pressure or stress on the spine. Having poor strength and flexibility. A history of back injury or surgery. Sitting for long periods of time. Doing activities that involve repetitive bending or lifting. Obesity. What are the signs or symptoms? Symptoms can vary from mild to very severe. They may include: Any of the following problems in the lower back, leg, hip, or buttock: Mild tingling, numbness, or dull aches. Burning sensations. Sharp pains. Numbness in the back of the calf or the sole of the foot. Leg weakness. Severe back pain that makes movement difficult. Symptoms may get worse when you cough, sneeze, or laugh, or when you sit or stand for long periods of time. How is this diagnosed? This condition may be diagnosed based on: Your symptoms and medical history. A physical exam. Blood tests. Imaging tests, such as: X-rays. An MRI. A CT scan. How is this treated? In many cases, this condition improves on its own without treatment.  However, treatment may include: Reducing or modifying physical activity. Exercising, including strengthening and stretching. Icing and applying heat to the affected area. Medicines that help to: Relieve pain and swelling. Relax your muscles. Injections of medicines that help to relieve pain  and inflammation (steroids) around the sciatic nerve. Surgery. Follow these instructions at home: Medicines Take over-the-counter and prescription medicines only as told by your health care provider. Ask your health care provider if the medicine prescribed to you requires you to avoid driving or using heavy machinery. Managing pain     If directed, put ice on the affected area. To do this: Put ice in a plastic bag. Place a towel between your skin and the bag. Leave the ice on for 20 minutes, 2-3 times a day. If your skin turns bright red, remove the ice right away to prevent skin damage. The risk of skin damage is higher if you cannot feel pain, heat, or cold. If directed, apply heat to the affected area as often as told by your health care provider. Use the heat source that your health care provider recommends, such as a moist heat pack or a heating pad. Place a towel between your skin and the heat source. Leave the heat on for 20-30 minutes. If your skin turns bright red, remove the heat right away to prevent burns. The risk of burns is higher if you cannot feel pain, heat, or cold. Activity  Return to your normal activities as told by your health care provider. Ask your health care provider what activities are safe for you. Avoid activities that make your symptoms worse. Take brief periods of rest throughout the day. When you rest for longer periods, mix in some mild activity or stretching between periods of rest. This will help to prevent stiffness and pain. Avoid sitting for long periods of time without moving. Get up and move around at least one time each hour. Exercise and stretch regularly as told by your health care provider. Do not lift anything that is heavier than 10 lb (4.5 kg) until your health care provider says that it is safe. When you do not have symptoms, you should still avoid heavy lifting, especially repetitive heavy lifting. When you lift objects, always use proper  lifting technique, which includes: Bending your knees. Keeping the load close to your body. Avoiding twisting. General instructions Maintain a healthy weight. Excess weight puts extra stress on your back. Wear supportive, comfortable shoes. Avoid wearing high heels. Avoid sleeping on a mattress that is too soft or too hard. A mattress that is firm enough to support your back when you sleep may help to reduce your pain. Contact a health care provider if: Your pain is not controlled by medicine. Your pain does not improve or gets worse. Your pain lasts longer than 4 weeks. You have unexplained weight loss. Get help right away if: You are not able to control when you urinate or have bowel movements (incontinence). You have: Weakness in your lower back, pelvis, buttocks, or legs that gets worse. Redness or swelling of your back. A burning sensation when you urinate. Summary Sciatica is pain, numbness, weakness, or tingling along the path of the sciatic nerve, which may include the lower back, legs, hips, and buttocks. This condition is caused by pressure on the sciatic nerve or pinching of the nerve. Treatment often includes rest, exercise, medicines, and applying ice or heat. This information is not intended to replace advice given to you by your health care provider.  Make sure you discuss any questions you have with your health care provider. Document Revised: 06/24/2021 Document Reviewed: 06/24/2021 Elsevier Patient Education  2024 [employer].
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
## 2023-09-26 ENCOUNTER — Other Ambulatory Visit: Payer: Self-pay | Admitting: Internal Medicine

## 2023-09-26 DIAGNOSIS — L304 Erythema intertrigo: Secondary | ICD-10-CM

## 2023-11-06 ENCOUNTER — Encounter: Payer: Self-pay | Admitting: Internal Medicine

## 2023-11-06 NOTE — Progress Notes (Unsigned)
 Pt attended 06/24/2023 screening event where her bp was 142/84 and her blood glucose was 101. Pt documented that she is not a smoker, she has Medicaid as her insurance, her PCP is Vicci Sober MD and she did not note any SDOH needs.  Chart review indicated that she is still actively an established pt with Dr Vicci. Pt has had office visits with Celestia Browning NP at [facility] medicine on 09/14/2023 Pt had a visit at also, [facility] with Darcus Mantis PA on 09/23/2023 where her b/p was 135/80 . Pt has an up and coming appt with her PCP Dr Vicci on 11/13/2023 at 1410. Pt also has an up and coming appt on 01/08/2024 to see her allergist.  Pt did not not SDOH needs on her consent form from event but SDOH was updates same day for SDOH housing need. This CHW called pt f/u on SDOH need. CHW left vm asking pt to call back at her earliest continence. Letter sent to pt with SDOH housing resources in case the pt does have a need. F/u to be scheduled per health equity protocol.    CHW made encounter in error and listed pts PCP as encounter provider instead of CHW Cherise Finder

## 2023-11-12 ENCOUNTER — Telehealth: Payer: Self-pay | Admitting: Internal Medicine

## 2023-11-12 NOTE — Telephone Encounter (Signed)
 Confirned appt for 8/15

## 2023-11-13 ENCOUNTER — Ambulatory Visit: Payer: MEDICAID | Admitting: Internal Medicine

## 2023-11-18 ENCOUNTER — Other Ambulatory Visit: Payer: Self-pay | Admitting: Internal Medicine

## 2023-11-18 DIAGNOSIS — I10 Essential (primary) hypertension: Secondary | ICD-10-CM

## 2023-11-18 DIAGNOSIS — N3281 Overactive bladder: Secondary | ICD-10-CM

## 2023-11-19 NOTE — Telephone Encounter (Signed)
 Courtesy refill. Patient will need an office visit for additional refills.  Requested Prescriptions  Pending Prescriptions Disp Refills   hydrochlorothiazide  (HYDRODIURIL ) 25 MG tablet [Pharmacy Med Name: HYDROCHLOROTHIAZIDE  25MG  TABLETS] 30 tablet 0    Sig: TAKE 1 TABLET(25 MG) BY MOUTH DAILY     Cardiovascular: Diuretics - Thiazide Failed - 11/19/2023  4:32 PM      Failed - Cr in normal range and within 180 days    Creat  Date Value Ref Range Status  12/05/2015 0.78 0.50 - 1.05 mg/dL Final    Comment:      For patients > or = 62 years of age: The upper reference limit for Creatinine is approximately 13% higher for people identified as African-American.      Creatinine, Ser  Date Value Ref Range Status  01/12/2023 1.32 (H) 0.57 - 1.00 mg/dL Final   Creatinine, Urine  Date Value Ref Range Status  12/07/2015 74 20 - 320 mg/dL Final         Failed - K in normal range and within 180 days    Potassium  Date Value Ref Range Status  01/12/2023 4.1 3.5 - 5.2 mmol/L Final         Failed - Na in normal range and within 180 days    Sodium  Date Value Ref Range Status  01/12/2023 139 134 - 144 mmol/L Final         Failed - Last BP in normal range    BP Readings from Last 1 Encounters:  09/23/23 (!) 108/91         Passed - Valid encounter within last 6 months    Recent Outpatient Visits           2 months ago Prediabetes   [facility] Celestia Rosaline SQUIBB, NP   3 months ago Seasonal allergic rhinitis due to pollen   [facility] Celestia Rosaline SQUIBB, NP   10 months ago Muscle cramps   [facility] [place] Vicci Sober B, MD   1 year ago Type 2 diabetes mellitus with morbid obesity [facility])   [place] [facility] [place] [facility] Vicci Sober B, MD   1 year ago Type 2 diabetes mellitus with morbid obesity [facility])   [facility] [place]. [facility] Vicci Sober NOVAK, MD       Future Appointments             In 1 month Padgett, Danita Macintosh, MD [place] [facility]             solifenacin  (VESICARE ) 10 MG tablet [Pharmacy Med Name: SOLIFENACIN  10MG  TABLETS] 30 tablet 0    Sig: TAKE 1 TABLET(10 MG) BY MOUTH DAILY     Urology:  Bladder Agents 2 Failed - 11/19/2023  4:32 PM      Failed - Cr in normal range and within 360 days    Creat  Date Value Ref Range Status  12/05/2015 0.78 0.50 - 1.05 mg/dL Final    Comment:      For patients > or = 62 years of age: The upper reference limit for Creatinine is approximately 13% higher for people identified as African-American.      Creatinine, Ser  Date Value Ref Range Status  01/12/2023 1.32 (  H) 0.57 - 1.00 mg/dL Final   Creatinine, Urine  Date Value Ref Range Status  12/07/2015 74 20 - 320 mg/dL Final         Passed - ALT in normal range and within 360 days    ALT  Date Value Ref Range Status  01/12/2023 21 0 - 32 IU/L Final         Passed - AST in normal range and within 360 days    AST  Date Value Ref Range Status  01/12/2023 24 0 - 40 IU/L Final         Passed - Valid encounter within last 12 months    Recent Outpatient Visits           2 months ago Prediabetes   [place] [facility] Celestia Rosaline SQUIBB, NP   3 months ago Seasonal allergic rhinitis due to pollen   [facility] Celestia Rosaline SQUIBB, NP   10 months ago Muscle cramps   [facility] [place]. [facility] Vicci Sober B, MD   1 year ago Type 2 diabetes mellitus with morbid obesity [facility])   [place] [facility] [place]. [facility] Vicci Sober B, MD   1 year ago Type 2 diabetes mellitus with morbid obesity [facility])    [facility]  [place]. [facility] Vicci Sober NOVAK, MD       Future Appointments             In 1 month Padgett, Danita Macintosh, MD  [facility]

## 2023-11-25 LAB — AMB RESULTS CONSOLE CBG: Glucose: 100

## 2023-11-25 NOTE — Progress Notes (Unsigned)
 Bp was 187/100. Pt does not smoke.

## 2023-12-24 ENCOUNTER — Telehealth: Payer: Self-pay | Admitting: Internal Medicine

## 2023-12-24 NOTE — Telephone Encounter (Signed)
 Confirmed appptmfor 9/26

## 2023-12-25 ENCOUNTER — Encounter: Payer: Self-pay | Admitting: Pharmacist

## 2023-12-25 ENCOUNTER — Ambulatory Visit (HOSPITAL_BASED_OUTPATIENT_CLINIC_OR_DEPARTMENT_OTHER): Payer: MEDICAID | Admitting: Pharmacist

## 2023-12-25 ENCOUNTER — Ambulatory Visit: Payer: MEDICAID | Attending: Internal Medicine | Admitting: Internal Medicine

## 2023-12-25 DIAGNOSIS — E119 Type 2 diabetes mellitus without complications: Secondary | ICD-10-CM | POA: Diagnosis not present

## 2023-12-25 DIAGNOSIS — E1159 Type 2 diabetes mellitus with other circulatory complications: Secondary | ICD-10-CM | POA: Diagnosis not present

## 2023-12-25 DIAGNOSIS — E1169 Type 2 diabetes mellitus with other specified complication: Secondary | ICD-10-CM

## 2023-12-25 DIAGNOSIS — I152 Hypertension secondary to endocrine disorders: Secondary | ICD-10-CM

## 2023-12-25 DIAGNOSIS — E785 Hyperlipidemia, unspecified: Secondary | ICD-10-CM

## 2023-12-25 DIAGNOSIS — Z23 Encounter for immunization: Secondary | ICD-10-CM

## 2023-12-25 DIAGNOSIS — Z7189 Other specified counseling: Secondary | ICD-10-CM

## 2023-12-25 DIAGNOSIS — Z7984 Long term (current) use of oral hypoglycemic drugs: Secondary | ICD-10-CM

## 2023-12-25 DIAGNOSIS — N3946 Mixed incontinence: Secondary | ICD-10-CM | POA: Diagnosis not present

## 2023-12-25 LAB — GLUCOSE, POCT (MANUAL RESULT ENTRY): POC Glucose: 110 mg/dL — AB (ref 70–99)

## 2023-12-25 LAB — POCT GLYCOSYLATED HEMOGLOBIN (HGB A1C): HbA1c, POC (controlled diabetic range): 5.8 % (ref 0.0–7.0)

## 2023-12-25 MED ORDER — OZEMPIC (0.25 OR 0.5 MG/DOSE) 2 MG/3ML ~~LOC~~ SOPN: 0.2500 mg | PEN_INJECTOR | SUBCUTANEOUS | 0 refills | Status: DC

## 2023-12-25 MED ORDER — SOLIFENACIN SUCCINATE 10 MG PO TABS: 10.0000 mg | ORAL_TABLET | Freq: Every day | ORAL | 1 refills | Status: AC

## 2023-12-25 MED ORDER — ATORVASTATIN CALCIUM 10 MG PO TABS
10.0000 mg | ORAL_TABLET | Freq: Every day | ORAL | 1 refills | Status: AC
Start: 2023-12-25 — End: ?

## 2023-12-25 MED ORDER — METFORMIN HCL 500 MG PO TABS: 500.0000 mg | ORAL_TABLET | Freq: Every day | ORAL | 1 refills | Status: AC

## 2023-12-25 MED ORDER — FREESTYLE LIBRE 3 PLUS SENSOR MISC
11 refills | Status: AC
Start: 2023-12-25 — End: ?

## 2023-12-25 MED ORDER — AMLODIPINE BESYLATE 5 MG PO TABS: 5.0000 mg | ORAL_TABLET | Freq: Every day | ORAL | 1 refills | Status: DC

## 2023-12-25 MED ORDER — HYDROCHLOROTHIAZIDE 25 MG PO TABS: 25.0000 mg | ORAL_TABLET | Freq: Every day | ORAL | 1 refills | Status: AC

## 2023-12-25 NOTE — Patient Instructions (Signed)
  VISIT SUMMARY: Today, we reviewed your chronic conditions, including diabetes, hypertension, and urinary incontinence. We discussed your current symptoms, medications, and lifestyle habits, and made adjustments to your treatment plan to better manage your health.  YOUR PLAN: -TYPE 2 DIABETES MELLITUS: Your diabetes is well-controlled with an A1c of 5.8%.  We will start you on Ozempic  0.25 mg once a week injection to help with weight loss.  If you develop any vomiting, abdominal pain, severe diarrhea or palpitations/heart racing while on the medication, please stop it and call us  immediately.  Please have the clinical pharmacist show you how to do the injection once the medication is dispensed.  Once you have been on the medicine for about 2 weeks, you can stop the metformin .  We will also send a prescription for a continuous glucose monitor to check your insurance coverage.  -OVERWEIGHT: We discussed the potential benefits of Ozempic  for weight loss. You are encouraged to make lifestyle changes, including healthier eating and increasing physical activity. Walking several days a week is recommended, and you will be referred to a nutritionist for dietary counseling.  -ESSENTIAL HYPERTENSION WITH LOWER EXTREMITY EDEMA: Your blood pressure is not at the target level, and you have swelling in your ankles. We will add amlodipine  to your medication regimen to better control your blood pressure and continue your current dose of hydrochlorothiazide . We will also check your potassium levels with a lab test.  -HYPERLIPIDEMIA: We discussed the importance of taking a statin medication to manage your cholesterol levels, especially since you have diabetes. We will continue your current atorvastatin  prescription.  -MIXED URINARY INCONTINENCE: You experience urinary incontinence, which is partly managed with your current medications. We will refill your Vesicare  prescription to help with overactive bladder symptoms and  submit a request for disposable adult washcloths to assist with management.  -GENERAL HEALTH MAINTENANCE: We talked about the importance of limiting salt intake, monitoring your diet, and keeping up with regular screenings and vaccinations. You received a flu shot today, and we will order lab tests to check your cholesterol, kidney, and liver function.  INSTRUCTIONS: Please follow up with the lab tests we ordered, including potassium, cholesterol, kidney, and liver function tests. Continue taking your medications as prescribed, and start the new medications as discussed. Schedule an appointment with a nutritionist for dietary counseling. Keep monitoring your blood pressure and blood sugar levels regularly. If you experience any side effects from the new medications, contact our office immediately.                      Contains text generated by Abridge.                                 Contains text generated by Abridge.
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
## 2023-12-25 NOTE — Progress Notes (Signed)
Patient was educated on the use of the Ozempic pen. Reviewed necessary supplies and operation of the pen. Also reviewed goal blood glucose levels. Patient was able to demonstrate use. All questions and concerns were addressed. ? ?Time spent counseling: 15 minutes ? ?Luke Van Ausdall, PharmD, BCACP, CPP ?Clinical Pharmacist ?[facility] ?[phone] ? ? ?

## 2023-12-26 ENCOUNTER — Ambulatory Visit: Payer: Self-pay | Admitting: Internal Medicine

## 2023-12-26 ENCOUNTER — Encounter: Payer: Self-pay | Admitting: Internal Medicine

## 2023-12-27 LAB — COMPREHENSIVE METABOLIC PANEL WITH GFR
ALT: 24 IU/L (ref 0–32)
AST: 22 IU/L (ref 0–40)
Albumin: 3.9 g/dL (ref 3.9–4.9)
Alkaline Phosphatase: 85 IU/L (ref 49–135)
BUN/Creatinine Ratio: 16 (ref 12–28)
BUN: 12 mg/dL (ref 8–27)
Bilirubin Total: 0.2 mg/dL (ref 0.0–1.2)
CO2: 22 mmol/L (ref 20–29)
Calcium: 9.4 mg/dL (ref 8.7–10.3)
Chloride: 100 mmol/L (ref 96–106)
Creatinine, Ser: 0.75 mg/dL (ref 0.57–1.00)
Globulin, Total: 3.2 g/dL (ref 1.5–4.5)
Glucose: 92 mg/dL (ref 70–99)
Potassium: 4.1 mmol/L (ref 3.5–5.2)
Sodium: 137 mmol/L (ref 134–144)
Total Protein: 7.1 g/dL (ref 6.0–8.5)
eGFR: 90 mL/min/1.73 (ref >59)

## 2023-12-27 LAB — CBC
Hematocrit: 41.1 % (ref 34.0–46.6)
Hemoglobin: 13.6 g/dL (ref 11.1–15.9)
MCH: 32.2 pg (ref 26.6–33.0)
MCHC: 33.1 g/dL (ref 31.5–35.7)
MCV: 97 fL (ref 79–97)
Platelets: 346 x10E3/uL (ref 150–450)
RBC: 4.22 x10E6/uL (ref 3.77–5.28)
RDW: 13.6 % (ref 11.7–15.4)
WBC: 6.4 x10E3/uL (ref 3.4–10.8)

## 2023-12-27 LAB — LIPID PANEL
Chol/HDL Ratio: 2.2 ratio (ref 0.0–4.4)
Cholesterol, Total: 229 mg/dL — ABNORMAL HIGH (ref 100–199)
HDL: 106 mg/dL (ref >39)
LDL Chol Calc (NIH): 105 mg/dL — ABNORMAL HIGH (ref 0–99)
Triglycerides: 109 mg/dL (ref 0–149)
VLDL Cholesterol Cal: 18 mg/dL (ref 5–40)

## 2023-12-27 LAB — MICROALBUMIN / CREATININE URINE RATIO
Creatinine, Urine: 79.5 mg/dL
Microalb/Creat Ratio: <4 mg/g{creat} (ref 0–29)
Microalbumin, Urine: <3 ug/mL

## 2023-12-28 NOTE — Telephone Encounter (Signed)
-----   Message from Barnie Louder sent at 12/26/2023 10:03 AM EDT ----- Regarding: Appt Needs appt with me in 6-8 weeks for PAP and obesity management

## 2023-12-28 NOTE — Telephone Encounter (Signed)
 Appointment scheduled.

## 2023-12-30 ENCOUNTER — Telehealth: Payer: Self-pay

## 2023-12-30 ENCOUNTER — Other Ambulatory Visit: Payer: Self-pay

## 2023-12-30 MED ORDER — ACCU-CHEK GUIDE TEST VI STRP: ORAL_STRIP | 6 refills | Status: AC

## 2023-12-30 MED ORDER — ACCU-CHEK SOFTCLIX LANCETS MISC: 6 refills | Status: AC

## 2023-12-30 MED ORDER — ACCU-CHEK GUIDE W/DEVICE KIT: PACK | 0 refills | Status: AC

## 2023-12-30 NOTE — Telephone Encounter (Signed)
 Pharmacy Patient Advocate Encounter  Received notification from [employer] [place] [employer] that Prior Authorization for FREESTLYE LIBRE 3 PLUS SENSOR has been DENIED.  Full denial letter will be uploaded to the media tab. See denial reason below.   PA #/Case ID/Reference #: [id number]  Patient must have insulin -dependent diabetes. [employer] does not recognize Ozempic  as meeting their 'insulin -dependent' criteria.

## 2023-12-30 NOTE — Telephone Encounter (Signed)
 Yes ma'am, rxns for Accu Chek sent to replace [place].

## 2023-12-30 NOTE — Addendum Note (Signed)
 Addended by: FLEETA MORRIS, GARNETTE L on: 12/30/2023 05:08 PM   Modules accepted: Orders

## 2023-12-31 ENCOUNTER — Other Ambulatory Visit: Payer: Self-pay

## 2024-01-08 ENCOUNTER — Encounter: Payer: Self-pay | Admitting: Family Medicine

## 2024-01-08 ENCOUNTER — Ambulatory Visit (INDEPENDENT_AMBULATORY_CARE_PROVIDER_SITE_OTHER): Payer: MEDICAID | Admitting: Family Medicine

## 2024-01-08 VITALS — BP 120/80 | HR 76 | Temp 97.7°F

## 2024-01-08 DIAGNOSIS — K219 Gastro-esophageal reflux disease without esophagitis: Secondary | ICD-10-CM

## 2024-01-08 DIAGNOSIS — J4540 Moderate persistent asthma, uncomplicated: Secondary | ICD-10-CM

## 2024-01-08 DIAGNOSIS — T7800XD Anaphylactic reaction due to unspecified food, subsequent encounter: Secondary | ICD-10-CM

## 2024-01-08 DIAGNOSIS — H1013 Acute atopic conjunctivitis, bilateral: Secondary | ICD-10-CM

## 2024-01-08 DIAGNOSIS — J302 Other seasonal allergic rhinitis: Secondary | ICD-10-CM

## 2024-01-08 DIAGNOSIS — J3089 Other allergic rhinitis: Secondary | ICD-10-CM | POA: Diagnosis not present

## 2024-01-08 DIAGNOSIS — J358 Other chronic diseases of tonsils and adenoids: Secondary | ICD-10-CM

## 2024-01-08 DIAGNOSIS — T7800XA Anaphylactic reaction due to unspecified food, initial encounter: Secondary | ICD-10-CM

## 2024-01-08 MED ORDER — OMEPRAZOLE 20 MG PO CPDR: 20.0000 mg | DELAYED_RELEASE_CAPSULE | Freq: Every day | ORAL | 5 refills | Status: AC

## 2024-01-08 MED ORDER — AZELASTINE HCL 0.1 % NA SOLN: 1.0000 | Freq: Two times a day (BID) | NASAL | 5 refills | Status: AC

## 2024-01-08 MED ORDER — ALBUTEROL SULFATE HFA 108 (90 BASE) MCG/ACT IN AERS: 2.0000 | INHALATION_SPRAY | Freq: Four times a day (QID) | RESPIRATORY_TRACT | 1 refills | Status: AC | PRN

## 2024-01-08 MED ORDER — BUDESONIDE-FORMOTEROL FUMARATE 160-4.5 MCG/ACT IN AERO: 2.0000 | INHALATION_SPRAY | Freq: Two times a day (BID) | RESPIRATORY_TRACT | 5 refills | Status: AC

## 2024-01-08 MED ORDER — CETIRIZINE HCL 10 MG PO TABS: 10.0000 mg | ORAL_TABLET | Freq: Two times a day (BID) | ORAL | 5 refills | Status: AC

## 2024-01-08 MED ORDER — EPINEPHRINE 0.3 MG/0.3ML IJ SOAJ: 0.3000 mg | INTRAMUSCULAR | 1 refills | Status: DC | PRN

## 2024-01-08 NOTE — Progress Notes (Signed)
 [address] [place] [address] Dept: [phone]  FOLLOW UP NOTE  Patient ID: Sabrina [name], female    DOB: 11/04/1960  Age: 63 y.o. MRN: [id number] Date of Office Visit: 01/08/2024  Assessment  Chief Complaint: Follow-up and Cough  HPI Sabrina [name] is a 63 year old female who presents to the clinic for follow-up visit.  She was last seen in this clinic on 07/02/2023 by Dr. 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  Discussed the use of AI scribe software for clinical note transcription with the patient, who gave verbal consent to proceed.  History of Present Illness Sabrina [name] is a 63 year old female with persistent cough and food allergies who presents with concerns about these conditions.  She has experienced a persistent cough for several years, which remains unresolved despite intermittent use of inhalers and nasal sprays. The cough is sometimes productive with phlegm, particularly in cold weather. She experiences occasional shortness of breath with exertion but no wheezing. She uses albuterol  as needed but not consistently. She is not currently using Symbicort  160. She reports that she has previously used Symbicort  160 with relief of symptoms.  She reports that she has been very busy lately and has not made time for herself.    She reports being told she is allergic to tomatoes and lemons based on IgE testing, but continues to consume these foods as she has not experienced severe symptoms like throat closing or hives. She occasionally experiences itching, particularly on the roof of her mouth, which she suspects may be related to these foods.  Her last food allergy testing via lab on 02/11/2021 indicated tomato IgE 0.72 and 11 IgE 0.37.  She has not stopped consuming these foods at this time.  She reports that she is interested in stopping 1 food  at a time to evaluate for a decrease in itching.  If no decrease in itching she will resume consuming the food.  She experiences symptoms of allergic rhinitis, including runny nose, sneezing, and itchy eyes. She takes cetirizine  daily, which helps manage these symptoms, especially throat itching. She has not been using her nasal spray or antihistamine regularly. Her last environmental allergies testing via lab on 04/11/2020 was positive to grass pollen, weed pollen, tree pollen, ragweed pollen, dust mite, cat, dog, and cockroach.  Allergic conjunctivitis is reported as moderately well-controlled with occasional dry and itchy eyes.  She is not currently using an allergy eyedrop or lubricating eyedrop.  She experiences heartburn frequently, however, denies vomiting.  She has previously taken omeprazole  with relief of symptoms, however, she is not currently taking this medication.  She occasionally experiences tonsillar stones and is not currently using a salt water gargle.  She has not seen an ENT specialist for evaluation of' tonsillar stones.  Her current medications are listed in the chart.   Drug Allergies:  Allergies  Allergen Reactions   Benzonatate  Other (See Comments)    Made her cough worse   Latex Dermatitis and Other (See Comments)    Hands became scaly   Losartan  Cough   Penicillins Other (See Comments)    Did it involve swelling of the face/tongue/throat, SOB, or low BP? Unk Did it involve sudden or severe rash/hives, skin peeling, or any reaction on the inside of your mouth or nose? Unk Did you need to seek medical attention at a hospital or doctor's office? Unk When did it  last happen? I was little; I don't remember, but my parents told me to never take it.  If all above answers are "NO", may proceed with cephalosporin use.     Lisinopril  Cough   Oxycodone -Acetaminophen  Nausea And Vomiting    Pt thinks she may be able to take with food (??)    Physical Exam: BP 120/80    Pulse 76   Temp 97.7 F (36.5 C)   SpO2 100%    Physical Exam Vitals reviewed.  Constitutional:      Appearance: Normal appearance.  HENT:     Head: Normocephalic and atraumatic.     Right Ear: Tympanic membrane normal.     Left Ear: Tympanic membrane normal.     Nose:     Comments: Bilateral nares erythematous with thin clear nasal drainage noted.  Pharynx normal.  Ears normal.  Eyes normal.    Mouth/Throat:     Pharynx: Oropharynx is clear.  Eyes:     Conjunctiva/sclera: Conjunctivae normal.  Cardiovascular:     Rate and Rhythm: Normal rate and regular rhythm.     Heart sounds: Normal heart sounds. No murmur heard. Pulmonary:     Effort: Pulmonary effort is normal.     Breath sounds: Normal breath sounds.     Comments: Lungs clear to auscultation Musculoskeletal:        General: Normal range of motion.     Cervical back: Normal range of motion and neck supple.  Skin:    General: Skin is warm and dry.  Neurological:     Mental Status: She is alert and oriented to person, place, and time.  Psychiatric:        Mood and Affect: Mood normal.        Behavior: Behavior normal.        Thought Content: Thought content normal.        Judgment: Judgment normal.     Diagnostics: FVC 2.35 which is 91% of predicted value, FEV1 1.93 which is 95% of predicted value.  Spirometry indicates normal ventilatory function  Assessment and Plan: 1. Not well controlled moderate persistent asthma   2. Seasonal and perennial allergic rhinitis   3. Allergic conjunctivitis of both eyes   4. Gastroesophageal reflux disease, unspecified whether esophagitis present   5. Tonsil stone   6. Allergy with anaphylaxis due to food     Meds ordered this encounter  Medications   EPINEPHrine  (EPIPEN  2-PAK) 0.3 mg/0.3 mL IJ SOAJ injection    Sig: Inject 0.3 mg into the muscle as needed for anaphylaxis.    Dispense:  0.3 mL    Refill:  1   albuterol  (VENTOLIN  HFA) 108 (90 Base) MCG/ACT inhaler     Sig: Inhale 2 puffs into the lungs every 6 (six) hours as needed for wheezing or shortness of breath.    Dispense:  18 g    Refill:  1   azelastine  (ASTELIN ) 0.1 % nasal spray    Sig: Place 1-2 sprays into both nostrils 2 (two) times daily.    Dispense:  30 mL    Refill:  5   budesonide -formoterol  (SYMBICORT ) 160-4.5 MCG/ACT inhaler    Sig: Inhale 2 puffs into the lungs 2 (two) times daily. Rinse mouth after use.    Dispense:  10.2 g    Refill:  5   cetirizine  (ZYRTEC ) 10 MG tablet    Sig: Take 1 tablet (10 mg total) by mouth 2 (two) times daily.    Dispense:  60 tablet    Refill:  5   omeprazole  (PRILOSEC) 20 MG capsule    Sig: Take 1 capsule (20 mg total) by mouth daily.    Dispense:  30 capsule    Refill:  5    Patient Instructions  Asthma Restart Symbicort  160-2 puffs twice a day with a spacer to prevent cough or wheeze Continue albuterol  2 puffs once every 4 hours if needed for cough or wheeze You may use albuterol  2 puffs 5-15 minutes before activity to decrease cough or wheeze   Allergic rhinitis Continue allergen avoidance measures directed toward grass pollen, weed pollen, tree pollen, ragweed pollen, dust mite, cat, dog, and cockroach as listed below Continue cetirizine  10 mg once a day if needed for runny nose or itch Restart Flonase  2 sprays in each nostril once a day if needed for stuffy nose Continue azelastine  2 sprays in each nostril up to twice a day if needed for runny nose or nasal itch Consider saline nasal rinses as needed for nasal symptoms. Use this before any medicated nasal sprays for best result Consider allergen immunotherapy if your symptoms are not well-controlled with the treatment plan as listed above  Allergic conjunctivitis Some over the counter eye drops include Pataday one drop in each eye once a day as needed for red, itchy eyes OR Zaditor one drop in each eye twice a day as needed for red itchy eyes. Avoid eye drops that say red eye relief as  they may contain medications that dry out your eyes.  Continue cetirizine  10 mg once or twice a day if needed for itch  Reflux Continue dietary lifestyle modifications as listed below Restart omeprazole  once a day for reflux control.  Take this medication 30 to 60 minutes before your first meal for best results  Tonsil stones Continue to gargle with warm salt water when affected Consider referral to ENT if the treatment plan as listed above does not control your symptoms  Food allergy Eliminate tomato from your diet for 1 week. If you continue to experience overall itch with no improvement then add  tomato back into your diet. Then eliminate lemon from your diet for 1 week. If no improvement in your overall itch then add lemon back into your diet. In case of an allergic reaction, take cetirizine  10 mg once every 12-24 hours.  No need for EpiPen  at this time as you are tolerating these foods daily  Call the clinic if this treatment plan is not working well for you.  Follow up in 2 months or sooner if needed.  Return in about 2 months (around 03/09/2024), or if symptoms worsen or fail to improve.    Thank you for the opportunity to care for this patient.  Please do not hesitate to contact me with questions.  Arlean Mutter, FNP [facility]      Arlean

## 2024-01-08 NOTE — Patient Instructions (Addendum)
 Asthma Restart Symbicort  160-2 puffs twice a day with a spacer to prevent cough or wheeze Continue albuterol  2 puffs once every 4 hours if needed for cough or wheeze You may use albuterol  2 puffs 5-15 minutes before activity to decrease cough or wheeze   Allergic rhinitis Continue allergen avoidance measures directed toward grass pollen, weed pollen, tree pollen, ragweed pollen, dust mite, cat, dog, and cockroach as listed below Continue cetirizine  10 mg once a day if needed for runny nose or itch Restart Flonase  2 sprays in each nostril once a day if needed for stuffy nose Continue azelastine  2 sprays in each nostril up to twice a day if needed for runny nose or nasal itch Consider saline nasal rinses as needed for nasal symptoms. Use this before any medicated nasal sprays for best result Consider allergen immunotherapy if your symptoms are not well-controlled with the treatment plan as listed above  Allergic conjunctivitis Some over the counter eye drops include Pataday one drop in each eye once a day as needed for red, itchy eyes OR Zaditor one drop in each eye twice a day as needed for red itchy eyes. Avoid eye drops that say red eye relief as they may contain medications that dry out your eyes.  Continue cetirizine  10 mg once or twice a day if needed for itch  Reflux Continue dietary lifestyle modifications as listed below Restart omeprazole  once a day for reflux control.  Take this medication 30 to 60 minutes before your first meal for best results  Tonsil stones Continue to gargle with warm salt water when affected Consider referral to ENT if the treatment plan as listed above does not control your symptoms  Food allergy Eliminate tomato from your diet for 1 week. If you continue to experience overall itch with no improvement then add  tomato back into your diet. Then eliminate lemon from your diet for 1 week. If no improvement in your overall itch then add lemon back into your  diet. In case of an allergic reaction, take cetirizine  10 mg once every 12-24 hours.  No need for EpiPen  at this time as you are tolerating these foods daily  Call the clinic if this treatment plan is not working well for you.  Follow up in 2 months or sooner if needed.  Reducing Pollen Exposure The American Academy of Allergy, Asthma and Immunology suggests the following steps to reduce your exposure to pollen during allergy seasons. Do not hang sheets or clothing out to dry; pollen may collect on these items. Do not mow lawns or spend time around freshly cut grass; mowing stirs up pollen. Keep windows closed at night.  Keep car windows closed while driving. Minimize morning activities outdoors, a time when pollen counts are usually at their highest. Stay indoors as much as possible when pollen counts or humidity is high and on windy days when pollen tends to remain in the air longer. Use air conditioning when possible.  Many air conditioners have filters that trap the pollen spores. Use a HEPA room air filter to remove pollen form the indoor air you breathe.  Control of Mold Allergen Mold and fungi can grow on a variety of surfaces provided certain temperature and moisture conditions exist.  Outdoor molds grow on plants, decaying vegetation and soil.  The major outdoor mold, Alternaria and Cladosporium, are found in very high numbers during hot and dry conditions.  Generally, a late Summer - Fall peak is seen for common outdoor fungal spores.  Rain will temporarily lower outdoor mold spore count, but counts rise rapidly when the rainy period ends.  The most important indoor molds are Aspergillus and Penicillium.  Dark, humid and poorly ventilated basements are ideal sites for mold growth.  The next most common sites of mold growth are the bathroom and the kitchen.  Outdoor [employer] Use air conditioning and keep windows closed Avoid exposure to decaying vegetation. Avoid leaf  raking. Avoid grain handling. Consider wearing a face mask if working in moldy areas.  Indoor Mold Control Maintain humidity below 50%. Clean washable surfaces with 5% bleach solution. Remove sources e.g. Contaminated carpets.   Control of Dust Mite Allergen Dust mites play a major role in allergic asthma and rhinitis. They occur in environments with high humidity wherever human skin is found. Dust mites absorb humidity from the atmosphere (ie, they do not drink) and feed on organic matter (including shed human and animal skin). Dust mites are a microscopic type of insect that you cannot see with the naked eye. High levels of dust mites have been detected from mattresses, pillows, carpets, upholstered furniture, bed covers, clothes, soft toys and any woven material. The principal allergen of the dust mite is found in its feces. A gram of dust may contain 1,000 mites and 250,000 fecal particles. Mite antigen is easily measured in the air during house cleaning activities. Dust mites do not bite and do not cause harm to humans, other than by triggering allergies/asthma.  Ways to decrease your exposure to dust mites in your home:  1. Encase mattresses, box springs and pillows with a mite-impermeable barrier or cover  2. Wash sheets, blankets and drapes weekly in hot water (130 F) with detergent and dry them in a dryer on the hot setting.  3. Have the room cleaned frequently with a vacuum cleaner and a damp dust-mop. For carpeting or rugs, vacuuming with a vacuum cleaner equipped with a high-efficiency particulate air (HEPA) filter. The dust mite allergic individual should not be in a room which is being cleaned and should wait 1 hour after cleaning before going into the room.  4. Do not sleep on upholstered furniture (eg, couches).  5. If possible removing carpeting, upholstered furniture and drapery from the home is ideal. Horizontal blinds should be eliminated in the rooms where the person  spends the most time (bedroom, study, television room). Washable vinyl, roller-type shades are optimal.  6. Remove all non-washable stuffed toys from the bedroom. Wash stuffed toys weekly like sheets and blankets above.  7. Reduce indoor humidity to less than 50%. Inexpensive humidity monitors can be purchased at most hardware stores. Do not use a humidifier as can make the problem worse and are not recommended.  Control of Dog or Cat Allergen Avoidance is the best way to manage a dog or cat allergy. If you have a dog or cat and are allergic to dog or cats, consider removing the dog or cat from the home. If you have a dog or cat but don't want to find it a new home, or if your family wants a pet even though someone in the household is allergic, here are some strategies that may help keep symptoms at bay:  Keep the pet out of your bedroom and restrict it to only a few rooms. Be advised that keeping the dog or cat in only one room will not limit the allergens to that room. Don't pet, hug or kiss the dog or cat; if you do, wash  your hands with soap and water. High-efficiency particulate air (HEPA) cleaners run continuously in a bedroom or living room can reduce allergen levels over time. Regular use of a high-efficiency vacuum cleaner or a central vacuum can reduce allergen levels. Giving your dog or cat a bath at least once a week can reduce airborne allergen.  Control of Cockroach Allergen Cockroach allergen has been identified as an important cause of acute attacks of asthma, especially in urban settings.  There are fifty-five species of cockroach that exist in the [place] , however only three, the [place], [place] and [place] species produce allergen that can affect patients with Asthma.  Allergens can be obtained from fecal particles, egg casings and secretions from cockroaches.    Remove food sources. Reduce access to water. Seal access and entry points. Spray runways with 0.5-1%  Diazinon or Chlorpyrifos Blow boric acid power under stoves and refrigerator. Place bait stations (hydramethylnon) at feeding sites.

## 2024-01-11 ENCOUNTER — Other Ambulatory Visit: Payer: Self-pay

## 2024-01-11 MED ORDER — COVID-19 MRNA VAC-TRIS(PFIZER) 30 MCG/0.3ML IM SUSY
0.3000 mL | PREFILLED_SYRINGE | Freq: Once | INTRAMUSCULAR | 0 refills | Status: AC
  Filled 2024-01-11: qty 0.3, 1d supply, fill #0

## 2024-01-11 NOTE — Progress Notes (Addendum)
 Pt attended 11/25/2023 screening event with BP of 187/100 with a elevated blood sugar & A1C, pt is diabetic. Pt noted at event that he does have a PCP. At event pt did not indicate any SDOH needs. Pt also noted that he is not a smoker and listed Medicaid as his insurance at the event.   Per initial f/u pt PCP was sent curtesy in-basket message.   Per chart review pt does have a PCP Versa Louder; [facility]), insurance, and is not a smoker. Pt does not indicate any SDOH needs at this time.  Pt also seen by Pharm D after PCP visit and continues to have allergy specialty support, the most recent visit being 01/08/24. Pt has future appt with PCP on 03/01/24. No mention of housing insecurity by PCP notes and PCP office has Medical Illustrator and SW if pt needs additional SDOH support.   No additional pt f/u to be scheduled at this time per health equity protocol.

## 2024-01-12 ENCOUNTER — Encounter: Payer: Self-pay | Admitting: *Deleted

## 2024-01-12 NOTE — Progress Notes (Signed)
 Pt attended 06/24/23 screening event (as well as 11/25/23 screening event.) At the 06/24/23 event, pt's b/p was 142/84 and her blood sugar was 101. At the 06/24/23 event, pt listed Dr. Barnie Louder at [facility] as her PCP and noted having Medicaid insurance, documented not being a smoker, and identified housing as an insecurity, for which she was given resources at the event and again at the initial event f/u. Chart review at this 6 month post event f/u indicates pt has been getting refills from Dr. Louder and has seen Sabrina Bohr, NP, at [facility] clinic twice since the event. Pt had visit with PCP on 12/25/23, where her b/p was 148/86 and both b/p and blood sugar control were discussed per PCP notes. Pt also seen by Pharm D after PCP visit and continues to have allergy specialty support, the most recent visit being 01/08/24. Pt has future appt with PCP on 03/01/24. No mention of housing insecurity by PCP notes and PCP office has Medical illustrator and SW if pt needs additional SDOH support. No additional heath equity team support indicated at this time

## 2024-01-17 ENCOUNTER — Telehealth: Payer: Self-pay | Admitting: Internal Medicine

## 2024-01-17 NOTE — Telephone Encounter (Signed)
-----   Message from [place] sent at 01/12/2024 11:59 AM EDT ----- Regarding: Event Patient Follow Up Hello Dr. Vicci,   This patient attended an 11/25/2023 [facility] screening event, where her BP was 187/100. The patient was given her results to share with you and we are letting you know this information is also documented in EPIC for your own review.   Thank You,  CHW with [facility] Equity Team

## 2024-01-17 NOTE — Telephone Encounter (Signed)
 Please give pt appt with clinical pharmacist ASAP for BP f/u.

## 2024-01-19 NOTE — Telephone Encounter (Signed)
 Patient scheduled for the next available appointment 11/13 at 2:30 pm.

## 2024-01-20 NOTE — Telephone Encounter (Signed)
 Sabrina [name]

## 2024-01-30 ENCOUNTER — Other Ambulatory Visit: Payer: Self-pay | Admitting: Physician Assistant

## 2024-01-30 DIAGNOSIS — M5441 Lumbago with sciatica, right side: Secondary | ICD-10-CM

## 2024-02-02 ENCOUNTER — Other Ambulatory Visit: Payer: Self-pay

## 2024-02-10 ENCOUNTER — Telehealth: Payer: Self-pay | Admitting: Internal Medicine

## 2024-02-10 NOTE — Telephone Encounter (Signed)
 Confirmed appt for 11/13

## 2024-02-11 ENCOUNTER — Ambulatory Visit: Payer: MEDICAID | Attending: Internal Medicine | Admitting: Pharmacist

## 2024-02-11 ENCOUNTER — Other Ambulatory Visit: Payer: Self-pay

## 2024-02-11 ENCOUNTER — Encounter: Payer: Self-pay | Admitting: Pharmacist

## 2024-02-11 DIAGNOSIS — I10 Essential (primary) hypertension: Secondary | ICD-10-CM

## 2024-02-11 DIAGNOSIS — E1159 Type 2 diabetes mellitus with other circulatory complications: Secondary | ICD-10-CM

## 2024-02-11 MED ORDER — AMLODIPINE BESYLATE 5 MG PO TABS
5.0000 mg | ORAL_TABLET | Freq: Every day | ORAL | 1 refills | Status: AC
  Filled 2024-02-11: qty 90, 90d supply, fill #0

## 2024-02-11 NOTE — Progress Notes (Addendum)
   S:    PCP: Dr. Vicci   No chief complaint on file.  Sabrina [name] is a 63 y.o. female who presents for hypertension evaluation, education, and management. PMH is significant for HTN, HL,, DM,hx of + RA factor, Bipolar Disorder (followed at [facility]), asthma, chronic cough (dx with cough variant asthma and allergic rhinittis by Dr. 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  Patient was referred and last seen by Primary Care Provider, Dr. Vicci, on 12/25/2023. BP at that visit was 148/86 mmHg. Amlodipine  was added to her regimen.  Today, patient arrives in good spirits and presents without assistance. Denies dizziness, headache, blurred vision, swelling.   Patient reports hypertension is longstanding. She has taken the hydrochlorothiazide  today but did not pick-up or start the amlodipine .   Family/Social history:  Fhx: HTN, DM, HLD, heart disease/MI  Tobacco: never  Alcohol: none  Medication adherence denied.   Current antihypertensives include:  -Amlodipine  5 mg daily (not taking) -HCTZ 25 mg daily  Reported home BP readings:  -None   Patient reported dietary habits:  -Sodium: does not add salt to her food. Does not cook with much salt.  -Caffeine: does drink coffee but drinks decaf   Patient-reported exercise habits:  - Tries exercising more but allergies limit.   O:  Vitals:   02/11/24 1530  BP: 139/84   Last 3 Office BP readings: BP Readings from Last 3 Encounters:  02/11/24 139/84  01/08/24 120/80  12/25/23 (!) 148/86   BMET    Component Value Date/Time   NA 137 12/25/2023 1048   K 4.1 12/25/2023 1048   CL 100 12/25/2023 1048   CO2 22 12/25/2023 1048   GLUCOSE 92 12/25/2023 1048   GLUCOSE 98 11/03/2021 1240   BUN 12 12/25/2023 1048   CREATININE 0.75 12/25/2023 1048   CREATININE 0.78 12/05/2015 0848   CALCIUM  9.4 12/25/2023 1048   GFRNONAA >60 11/03/2021 1240   GFRNONAA 67 10/11/2015 1222   GFRAA 92  12/13/2019 1232   GFRAA 77 10/11/2015 1222    Renal function: CrCl cannot be calculated (Patient's most recent lab result is older than the maximum 21 days allowed.).  Clinical ASCVD: No  The ASCVD Risk score (Arnett DK, et al., 2019) failed to calculate for the following reasons:   The valid HDL cholesterol range is 20 to 100 mg/dL  A/P: Hypertension longstanding currently above goal on current medications. BP goal < 130/80 mmHg. Medication adherence is suboptimal. Collaborated with our pharmacy to fill amlodipine  today. Will have her pick this up when she leaves.  -Continue HCTZ 25 mg daily.  -Start amlodipine  5 mg daily as prescribed.  -Patient educated on purpose, proper use, and potential adverse effects of hydrochlorothiazide , amlodipine .  -Counseled on lifestyle modifications for blood pressure control including reduced dietary sodium, increased exercise, adequate sleep. -Encouraged patient to check BP at home and bring log of readings to next visit. Counseled on proper use of home BP cuff.   Results reviewed and written information provided. Patient verbalized understanding of treatment plan. Total time in face-to-face counseling 30 minutes.   F/u clinic visit with PCP.  Herlene Fleeta Morris, PharmD, JAQUELINE, CPP Clinical Pharmacist [facility] [phone]

## 2024-03-01 ENCOUNTER — Other Ambulatory Visit (HOSPITAL_COMMUNITY)
Admission: RE | Admit: 2024-03-01 | Discharge: 2024-03-01 | Disposition: A | Discharge status: Home / Self Care | Payer: MEDICAID | Source: Ambulatory Visit | Attending: Internal Medicine | Admitting: Internal Medicine

## 2024-03-01 ENCOUNTER — Ambulatory Visit: Payer: MEDICAID | Attending: Internal Medicine | Admitting: Internal Medicine

## 2024-03-01 VITALS — BP 116/78 | HR 84 | Temp 98.4°F | Ht 63.0 in | Wt 283.0 lb

## 2024-03-01 DIAGNOSIS — Z7985 Long-term (current) use of injectable non-insulin antidiabetic drugs: Secondary | ICD-10-CM

## 2024-03-01 DIAGNOSIS — R4189 Other symptoms and signs involving cognitive functions and awareness: Secondary | ICD-10-CM

## 2024-03-01 DIAGNOSIS — Z124 Encounter for screening for malignant neoplasm of cervix: Secondary | ICD-10-CM

## 2024-03-01 DIAGNOSIS — E1169 Type 2 diabetes mellitus with other specified complication: Secondary | ICD-10-CM | POA: Diagnosis not present

## 2024-03-01 DIAGNOSIS — Z1231 Encounter for screening mammogram for malignant neoplasm of breast: Secondary | ICD-10-CM

## 2024-03-01 DIAGNOSIS — Z6841 Body Mass Index (BMI) 40.0 and over, adult: Secondary | ICD-10-CM

## 2024-03-01 MED ORDER — OZEMPIC (0.25 OR 0.5 MG/DOSE) 2 MG/3ML ~~LOC~~ SOPN: 0.2500 mg | PEN_INJECTOR | SUBCUTANEOUS | 0 refills | Status: DC

## 2024-03-01 NOTE — Patient Instructions (Addendum)
                         Contains text generated by Abridge.                                 Contains text generated by Abridge.                          Contains text generated by Abridge.                                 Contains text generated by Abridge.
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
## 2024-03-02 ENCOUNTER — Encounter: Payer: Self-pay | Admitting: Internal Medicine

## 2024-03-04 LAB — CYTOLOGY - PAP
Adequacy: ABSENT
Comment: NEGATIVE
Diagnosis: NEGATIVE
High risk HPV: NEGATIVE

## 2024-03-06 ENCOUNTER — Ambulatory Visit: Payer: Self-pay | Admitting: Internal Medicine

## 2024-03-11 ENCOUNTER — Other Ambulatory Visit: Payer: Self-pay

## 2024-03-13 NOTE — Patient Instructions (Incomplete)
 Asthma Continue Symbicort  160-2 puffs twice a day with a spacer to prevent cough or wheeze Continue albuterol  2 puffs once every 4 hours if needed for cough or wheeze You may use albuterol  2 puffs 5-15 minutes before activity to decrease cough or wheeze   Allergic rhinitis Continue allergen avoidance measures directed toward grass pollen, weed pollen, tree pollen, ragweed pollen, dust mite, cat, dog, and cockroach as listed below Continue cetirizine  10 mg once a day if needed for runny nose or itch Continue Flonase  2 sprays in each nostril once a day if needed for stuffy nose Continue azelastine  2 sprays in each nostril up to twice a day if needed for runny nose or nasal itch Consider saline nasal rinses as needed for nasal symptoms. Use this before any medicated nasal sprays for best result Consider allergen immunotherapy if your symptoms are not well-controlled with the treatment plan as listed above  Allergic conjunctivitis Some over the counter eye drops include Pataday one drop in each eye once a day as needed for red, itchy eyes OR Zaditor one drop in each eye twice a day as needed for red itchy eyes. Avoid eye drops that say red eye relief as they may contain medications that dry out your eyes.  Continue cetirizine  10 mg once or twice a day if needed for itch  Reflux Continue dietary lifestyle modifications as listed below Restart omeprazole  once a day for reflux control.  Take this medication 30 to 60 minutes before your first meal for best results  Tonsil stones Continue to gargle with warm salt water when affected Consider referral to ENT if the treatment plan as listed above does not control your symptoms  Food allergy Eliminate tomato from your diet for 1 week. If you continue to experience overall itch with no improvement then add  tomato back into your diet. Then eliminate lemon from your diet for 1 week. If no improvement in your overall itch then add lemon back into your  diet. In case of an allergic reaction, take cetirizine  10 mg once every 12-24 hours.  No need for EpiPen  at this time as you are tolerating these foods daily  Call the clinic if this treatment plan is not working well for you.  Follow up in 2 months or sooner if needed.  Reducing Pollen Exposure The American Academy of Allergy, Asthma and Immunology suggests the following steps to reduce your exposure to pollen during allergy seasons. Do not hang sheets or clothing out to dry; pollen may collect on these items. Do not mow lawns or spend time around freshly cut grass; mowing stirs up pollen. Keep windows closed at night.  Keep car windows closed while driving. Minimize morning activities outdoors, a time when pollen counts are usually at their highest. Stay indoors as much as possible when pollen counts or humidity is high and on windy days when pollen tends to remain in the air longer. Use air conditioning when possible.  Many air conditioners have filters that trap the pollen spores. Use a HEPA room air filter to remove pollen form the indoor air you breathe.  Control of Mold Allergen Mold and fungi can grow on a variety of surfaces provided certain temperature and moisture conditions exist.  Outdoor molds grow on plants, decaying vegetation and soil.  The major outdoor mold, Alternaria and Cladosporium, are found in very high numbers during hot and dry conditions.  Generally, a late Summer - Fall peak is seen for common outdoor fungal spores.  Rain will temporarily lower outdoor mold spore count, but counts rise rapidly when the rainy period ends.  The most important indoor molds are Aspergillus and Penicillium.  Dark, humid and poorly ventilated basements are ideal sites for mold growth.  The next most common sites of mold growth are the bathroom and the kitchen.  Outdoor [employer] Use air conditioning and keep windows closed Avoid exposure to decaying vegetation. Avoid leaf  raking. Avoid grain handling. Consider wearing a face mask if working in moldy areas.  Indoor Mold Control Maintain humidity below 50%. Clean washable surfaces with 5% bleach solution. Remove sources e.g. Contaminated carpets.   Control of Dust Mite Allergen Dust mites play a major role in allergic asthma and rhinitis. They occur in environments with high humidity wherever human skin is found. Dust mites absorb humidity from the atmosphere (ie, they do not drink) and feed on organic matter (including shed human and animal skin). Dust mites are a microscopic type of insect that you cannot see with the naked eye. High levels of dust mites have been detected from mattresses, pillows, carpets, upholstered furniture, bed covers, clothes, soft toys and any woven material. The principal allergen of the dust mite is found in its feces. A gram of dust may contain 1,000 mites and 250,000 fecal particles. Mite antigen is easily measured in the air during house cleaning activities. Dust mites do not bite and do not cause harm to humans, other than by triggering allergies/asthma.  Ways to decrease your exposure to dust mites in your home:  1. Encase mattresses, box springs and pillows with a mite-impermeable barrier or cover  2. Wash sheets, blankets and drapes weekly in hot water (130 F) with detergent and dry them in a dryer on the hot setting.  3. Have the room cleaned frequently with a vacuum cleaner and a damp dust-mop. For carpeting or rugs, vacuuming with a vacuum cleaner equipped with a high-efficiency particulate air (HEPA) filter. The dust mite allergic individual should not be in a room which is being cleaned and should wait 1 hour after cleaning before going into the room.  4. Do not sleep on upholstered furniture (eg, couches).  5. If possible removing carpeting, upholstered furniture and drapery from the home is ideal. Horizontal blinds should be eliminated in the rooms where the person  spends the most time (bedroom, study, television room). Washable vinyl, roller-type shades are optimal.  6. Remove all non-washable stuffed toys from the bedroom. Wash stuffed toys weekly like sheets and blankets above.  7. Reduce indoor humidity to less than 50%. Inexpensive humidity monitors can be purchased at most hardware stores. Do not use a humidifier as can make the problem worse and are not recommended.  Control of Dog or Cat Allergen Avoidance is the best way to manage a dog or cat allergy. If you have a dog or cat and are allergic to dog or cats, consider removing the dog or cat from the home. If you have a dog or cat but dont want to find it a new home, or if your family wants a pet even though someone in the household is allergic, here are some strategies that may help keep symptoms at bay:  Keep the pet out of your bedroom and restrict it to only a few rooms. Be advised that keeping the dog or cat in only one room will not limit the allergens to that room. Dont pet, hug or kiss the dog or cat; if you do, wash  your hands with soap and water. High-efficiency particulate air (HEPA) cleaners run continuously in a bedroom or living room can reduce allergen levels over time. Regular use of a high-efficiency vacuum cleaner or a central vacuum can reduce allergen levels. Giving your dog or cat a bath at least once a week can reduce airborne allergen.  Control of Cockroach Allergen Cockroach allergen has been identified as an important cause of acute attacks of asthma, especially in urban settings.  There are fifty-five species of cockroach that exist in the [place] , however only three, the American, German and Oriental species produce allergen that can affect patients with Asthma.  Allergens can be obtained from fecal particles, egg casings and secretions from cockroaches.    Remove food sources. Reduce access to water. Seal access and entry points. Spray runways with 0.5-1%  Diazinon or Chlorpyrifos Blow boric acid power under stoves and refrigerator. Place bait stations (hydramethylnon) at feeding sites.

## 2024-03-13 NOTE — Progress Notes (Deleted)
° °  [address] [place] [address] Dept: [phone]  FOLLOW UP NOTE  Patient ID: Sabrina [name], female    DOB: 1961/02/23  Age: 63 y.o. MRN: [id number] Date of Office Visit: 03/14/2024  Assessment  Chief Complaint: No chief complaint on file.  HPI Sabrina [name] is a 63 year old female who presents to the clinic for follow-up visit.  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  Discussed the use of AI scribe software for clinical note transcription with the patient, who gave verbal consent to proceed.  History of Present Illness      Drug Allergies:  Allergies[1]  Physical Exam: There were no vitals taken for this visit.   Physical Exam  Diagnostics:    Assessment and Plan: No diagnosis found.  No orders of the defined types were placed in this encounter.   There are no Patient Instructions on file for this visit.  No follow-ups on file.    Thank you for the opportunity to care for this patient.  Please do not hesitate to contact me with questions.  Arlean Mutter, FNP [facility]          [1]  Allergies Allergen Reactions   Benzonatate  Other (See Comments)    Made her cough worse   Latex Dermatitis and Other (See Comments)    Hands became scaly   Losartan  Cough   Penicillins Other (See Comments)    Did it involve swelling of the face/tongue/throat, SOB, or low BP? Unk Did it involve sudden or severe rash/hives, skin peeling, or any reaction on the inside of your mouth or nose? Unk Did you need to seek medical attention at a hospital or doctor's office? Unk When did it last happen? I was little; I don't remember, but my parents told me to never take it.  If all above answers are NO, may proceed with cephalosporin use.     Lisinopril  Cough    Oxycodone -Acetaminophen  Nausea And Vomiting    Pt thinks she may be able to take with food (??)

## 2024-03-14 ENCOUNTER — Ambulatory Visit: Payer: MEDICAID | Admitting: Family Medicine

## 2024-03-15 ENCOUNTER — Ambulatory Visit: Payer: MEDICAID | Admitting: Pharmacist

## 2024-03-27 NOTE — Progress Notes (Unsigned)
 0  [address] [place] [address] Dept: [phone]  FOLLOW UP NOTE  Patient ID: Sabrina [name], female    DOB: 05/27/1960  Age: 63 y.o. MRN: [id number] Date of Office Visit: 03/28/2024  Assessment  Chief Complaint: Follow-up and Cough (Pt states that she is still having a coughing issue, she states that this week it has became worse. Some days are better than others )  HPI Sabrina [name] is a 63 year old female who presents to the clinic for follow-up visit.  She was last seen in this clinic on 01/08/2024 by Arlean Mutter, FNP, for evaluation of asthma, allergic rhinitis, allergic conjunctivitis, reflux, tonsillar stone, and food allergy/sensitivity to tomato and lemon.  Discussed the use of AI scribe software for clinical note transcription with the patient, who gave verbal consent to proceed.  History of Present Illness Sabrina [name] is a 63 year old female who presents with persistent cough and allergy symptoms.  Today's visit, she reports that she continues to experience cough producing clear thick mucus at frequent intervals.  She denies shortness of breath or wheeze with activity or rest.  She does report that the cough can be aggravated by weather changes.  She reports that she has tried albuterol  and Symbicort  for several days in a row and then stopped these medications as she feels they were not helping her cough.  She is not currently using Symbicort  or albuterol .    Allergic rhinitis is reported as poorly controlled with symptoms including clear rhinorrhea, nasal congestion, sneezing, and postnasal drainage with cough and throat clearing.  She reports that she is not currently using any nasal sprays including Flonase  or nasal saline rinses.  She is not currently taking an antihistamine.  She reports that she previously received allergy injections for about 3 years when she was a teenager. And states that her allergy symptoms seemed to be more controlled at  that time. Her last environmental allergy testing via lab on 04/11/2020 was positive to grass pollen, weed pollen, tree pollen, ragweed pollen, dust mite, cat, dog, and cockroach.  She reports that she may be interested in allergen immunotherapy in the future.  Allergic conjunctivitis is reported as well-controlled with no symptoms including red or itchy eyes.  She continues olopatadine if needed with relief of symptoms.  Reflux is reported as poorly controlled with heartburn occurring about 1 to 2 days a week.  She reports that she has previously taken omeprazole  with relief of symptoms, however, reports that she is not currently taking omeprazole  or any other medication to control reflux.  She continues to avoid large amounts of tomato and lemon with a reported reduction in pruritus.  She reports that she does occasionally eat these foods with no anaphylactic symptoms. Her last food allergy testing via lab on 02/11/2021 indicated tomato IgE 0.72 and 11 IgE 0.37.   She denies recent episodes of tonsillar stones.  Her current medications are listed in the chart.  Drug Allergies:  Allergies[1]  Physical Exam: BP 134/70 (BP Location: Left Arm, Patient Position: Sitting, Cuff Size: Normal)   Pulse 90   Temp 97.7 F (36.5 C)   SpO2 98%    Physical Exam Vitals reviewed.  Constitutional:      Appearance: Normal appearance.  HENT:     Head: Normocephalic and atraumatic.     Right Ear: Tympanic membrane normal.     Left Ear: Tympanic membrane normal.     Nose:     Comments: Bilateral nares  edematous and pale with thin clear nasal drainage noted.  Pharynx normal.  Ears normal.  Eyes normal.    Mouth/Throat:     Pharynx: Oropharynx is clear.  Eyes:     Conjunctiva/sclera: Conjunctivae normal.  Cardiovascular:     Rate and Rhythm: Normal rate and regular rhythm.     Heart sounds: Normal heart sounds. No murmur heard. Pulmonary:     Effort: Pulmonary effort is normal.     Breath sounds:  Normal breath sounds.     Comments: Lungs clear to auscultation.  Patient with cough producing mucus during the interview. Musculoskeletal:        General: Normal range of motion.     Cervical back: Normal range of motion and neck supple.  Skin:    General: Skin is warm and dry.  Neurological:     Mental Status: She is alert and oriented to person, place, and time.  Psychiatric:        Mood and Affect: Mood normal.        Behavior: Behavior normal.        Thought Content: Thought content normal.        Judgment: Judgment normal.     Assessment and Plan: Normal 1. Not well controlled moderate persistent asthma   2. Seasonal and perennial allergic rhinitis   3. Allergic conjunctivitis of both eyes   4. Gastroesophageal reflux disease, unspecified whether esophagitis present   5. Food intolerance     Patient Instructions  Asthma Begin Symbicort  160-2 puffs twice a day with a spacer to prevent cough or wheeze Continue albuterol  2 puffs once every 4 hours if needed for cough or wheeze You may use albuterol  2 puffs 5-15 minutes before activity to decrease cough or wheeze   Allergic rhinitis Continue allergen avoidance measures directed toward grass pollen, weed pollen, tree pollen, ragweed pollen, dust mite, cat, dog, and cockroach as listed below Continue cetirizine  10 mg once a day if needed for runny nose or itch Continue Flonase  2 sprays in each nostril once a day if needed for stuffy nose Continue azelastine  2 sprays in each nostril up to twice a day if needed for runny nose or nasal itch Consider saline nasal rinses as needed for nasal symptoms. Use this before any medicated nasal sprays for best result Consider allergen immunotherapy if your symptoms are not well-controlled with the treatment plan as listed above  Allergic conjunctivitis Some over the counter eye drops include Pataday one drop in each eye once a day as needed for red, itchy eyes OR Zaditor one drop in each  eye twice a day as needed for red itchy eyes. Avoid eye drops that say red eye relief as they may contain medications that dry out your eyes.   Reflux Continue dietary lifestyle modifications as listed below Restart omeprazole  once a day for reflux control.  Take this medication 30 to 60 minutes before your first meal for best results  Tonsil stones Continue to gargle with warm salt water when affected Consider referral to ENT if the treatment plan as listed above does not control your symptoms  Food allergy/intolerance Continue to limit consumption of tomato and lemon as you have been to decrease pruritus.  In case of an allergic reaction, take cetirizine  10 mg once every 12-24 hours.  No need for EpiPen  at this time as you are tolerating these foods regularly  Call the clinic if this treatment plan is not working well for you.  Follow up in  2 months or sooner if needed.  Return in about 2 months (around 05/28/2024), or if symptoms worsen or fail to improve.    Thank you for the opportunity to care for this patient.  Please do not hesitate to contact me with questions.  Arlean Mutter, FNP [facility]           [1]  Allergies Allergen Reactions   Benzonatate  Other (See Comments)    Made her cough worse   Latex Dermatitis and Other (See Comments)    Hands became scaly   Losartan  Cough   Penicillins Other (See Comments)    Did it involve swelling of the face/tongue/throat, SOB, or low BP? Unk Did it involve sudden or severe rash/hives, skin peeling, or any reaction on the inside of your mouth or nose? Unk Did you need to seek medical attention at a hospital or doctor's office? Unk When did it last happen? I was little; I don't remember, but my parents told me to never take it.  If all above answers are NO, may proceed with cephalosporin use.     Lisinopril  Cough   Oxycodone -Acetaminophen  Nausea And Vomiting    Pt thinks she may be able  to take with food (??)

## 2024-03-27 NOTE — Patient Instructions (Incomplete)
 Asthma Continue Symbicort  160-2 puffs twice a day with a spacer to prevent cough or wheeze Continue albuterol  2 puffs once every 4 hours if needed for cough or wheeze You may use albuterol  2 puffs 5-15 minutes before activity to decrease cough or wheeze   Allergic rhinitis Continue allergen avoidance measures directed toward grass pollen, weed pollen, tree pollen, ragweed pollen, dust mite, cat, dog, and cockroach as listed below Continue cetirizine  10 mg once a day if needed for runny nose or itch Continue Flonase  2 sprays in each nostril once a day if needed for stuffy nose Continue azelastine  2 sprays in each nostril up to twice a day if needed for runny nose or nasal itch Consider saline nasal rinses as needed for nasal symptoms. Use this before any medicated nasal sprays for best result Consider allergen immunotherapy if your symptoms are not well-controlled with the treatment plan as listed above  Allergic conjunctivitis Some over the counter eye drops include Pataday one drop in each eye once a day as needed for red, itchy eyes OR Zaditor one drop in each eye twice a day as needed for red itchy eyes. Avoid eye drops that say red eye relief as they may contain medications that dry out your eyes.  Continue cetirizine  10 mg once or twice a day if needed for itch  Reflux Continue dietary lifestyle modifications as listed below Restart omeprazole  once a day for reflux control.  Take this medication 30 to 60 minutes before your first meal for best results  Tonsil stones Continue to gargle with warm salt water when affected Consider referral to ENT if the treatment plan as listed above does not control your symptoms  Food allergy Eliminate tomato from your diet for 1 week. If you continue to experience overall itch with no improvement then add  tomato back into your diet. Then eliminate lemon from your diet for 1 week. If no improvement in your overall itch then add lemon back into your  diet. In case of an allergic reaction, take cetirizine  10 mg once every 12-24 hours.  No need for EpiPen  at this time as you are tolerating these foods daily  Call the clinic if this treatment plan is not working well for you.  Follow up in 2 months or sooner if needed.  Reducing Pollen Exposure The American Academy of Allergy, Asthma and Immunology suggests the following steps to reduce your exposure to pollen during allergy seasons. Do not hang sheets or clothing out to dry; pollen may collect on these items. Do not mow lawns or spend time around freshly cut grass; mowing stirs up pollen. Keep windows closed at night.  Keep car windows closed while driving. Minimize morning activities outdoors, a time when pollen counts are usually at their highest. Stay indoors as much as possible when pollen counts or humidity is high and on windy days when pollen tends to remain in the air longer. Use air conditioning when possible.  Many air conditioners have filters that trap the pollen spores. Use a HEPA room air filter to remove pollen form the indoor air you breathe.  Control of Mold Allergen Mold and fungi can grow on a variety of surfaces provided certain temperature and moisture conditions exist.  Outdoor molds grow on plants, decaying vegetation and soil.  The major outdoor mold, Alternaria and Cladosporium, are found in very high numbers during hot and dry conditions.  Generally, a late Summer - Fall peak is seen for common outdoor fungal spores.  Rain will temporarily lower outdoor mold spore count, but counts rise rapidly when the rainy period ends.  The most important indoor molds are Aspergillus and Penicillium.  Dark, humid and poorly ventilated basements are ideal sites for mold growth.  The next most common sites of mold growth are the bathroom and the kitchen.  Outdoor [employer] Use air conditioning and keep windows closed Avoid exposure to decaying vegetation. Avoid leaf  raking. Avoid grain handling. Consider wearing a face mask if working in moldy areas.  Indoor Mold Control Maintain humidity below 50%. Clean washable surfaces with 5% bleach solution. Remove sources e.g. Contaminated carpets.   Control of Dust Mite Allergen Dust mites play a major role in allergic asthma and rhinitis. They occur in environments with high humidity wherever human skin is found. Dust mites absorb humidity from the atmosphere (ie, they do not drink) and feed on organic matter (including shed human and animal skin). Dust mites are a microscopic type of insect that you cannot see with the naked eye. High levels of dust mites have been detected from mattresses, pillows, carpets, upholstered furniture, bed covers, clothes, soft toys and any woven material. The principal allergen of the dust mite is found in its feces. A gram of dust may contain 1,000 mites and 250,000 fecal particles. Mite antigen is easily measured in the air during house cleaning activities. Dust mites do not bite and do not cause harm to humans, other than by triggering allergies/asthma.  Ways to decrease your exposure to dust mites in your home:  1. Encase mattresses, box springs and pillows with a mite-impermeable barrier or cover  2. Wash sheets, blankets and drapes weekly in hot water (130 F) with detergent and dry them in a dryer on the hot setting.  3. Have the room cleaned frequently with a vacuum cleaner and a damp dust-mop. For carpeting or rugs, vacuuming with a vacuum cleaner equipped with a high-efficiency particulate air (HEPA) filter. The dust mite allergic individual should not be in a room which is being cleaned and should wait 1 hour after cleaning before going into the room.  4. Do not sleep on upholstered furniture (eg, couches).  5. If possible removing carpeting, upholstered furniture and drapery from the home is ideal. Horizontal blinds should be eliminated in the rooms where the person  spends the most time (bedroom, study, television room). Washable vinyl, roller-type shades are optimal.  6. Remove all non-washable stuffed toys from the bedroom. Wash stuffed toys weekly like sheets and blankets above.  7. Reduce indoor humidity to less than 50%. Inexpensive humidity monitors can be purchased at most hardware stores. Do not use a humidifier as can make the problem worse and are not recommended.  Control of Dog or Cat Allergen Avoidance is the best way to manage a dog or cat allergy. If you have a dog or cat and are allergic to dog or cats, consider removing the dog or cat from the home. If you have a dog or cat but dont want to find it a new home, or if your family wants a pet even though someone in the household is allergic, here are some strategies that may help keep symptoms at bay:  Keep the pet out of your bedroom and restrict it to only a few rooms. Be advised that keeping the dog or cat in only one room will not limit the allergens to that room. Dont pet, hug or kiss the dog or cat; if you do, wash  your hands with soap and water. High-efficiency particulate air (HEPA) cleaners run continuously in a bedroom or living room can reduce allergen levels over time. Regular use of a high-efficiency vacuum cleaner or a central vacuum can reduce allergen levels. Giving your dog or cat a bath at least once a week can reduce airborne allergen.  Control of Cockroach Allergen Cockroach allergen has been identified as an important cause of acute attacks of asthma, especially in urban settings.  There are fifty-five species of cockroach that exist in the [place] , however only three, the American, German and Oriental species produce allergen that can affect patients with Asthma.  Allergens can be obtained from fecal particles, egg casings and secretions from cockroaches.    Remove food sources. Reduce access to water. Seal access and entry points. Spray runways with 0.5-1%  Diazinon or Chlorpyrifos Blow boric acid power under stoves and refrigerator. Place bait stations (hydramethylnon) at feeding sites.

## 2024-03-28 ENCOUNTER — Encounter: Payer: Self-pay | Admitting: Family Medicine

## 2024-03-28 ENCOUNTER — Ambulatory Visit (INDEPENDENT_AMBULATORY_CARE_PROVIDER_SITE_OTHER): Payer: MEDICAID | Admitting: Family Medicine

## 2024-03-28 ENCOUNTER — Other Ambulatory Visit: Payer: Self-pay

## 2024-03-28 VITALS — BP 134/70 | HR 90 | Temp 97.7°F

## 2024-03-28 DIAGNOSIS — T7800XA Anaphylactic reaction due to unspecified food, initial encounter: Secondary | ICD-10-CM | POA: Insufficient documentation

## 2024-03-28 DIAGNOSIS — K9049 Malabsorption due to intolerance, not elsewhere classified: Secondary | ICD-10-CM | POA: Insufficient documentation

## 2024-03-28 DIAGNOSIS — J4540 Moderate persistent asthma, uncomplicated: Secondary | ICD-10-CM | POA: Insufficient documentation

## 2024-03-28 DIAGNOSIS — J302 Other seasonal allergic rhinitis: Secondary | ICD-10-CM | POA: Insufficient documentation

## 2024-03-28 DIAGNOSIS — J3089 Other allergic rhinitis: Secondary | ICD-10-CM

## 2024-03-28 DIAGNOSIS — H1013 Acute atopic conjunctivitis, bilateral: Secondary | ICD-10-CM | POA: Insufficient documentation

## 2024-03-28 DIAGNOSIS — K219 Gastro-esophageal reflux disease without esophagitis: Secondary | ICD-10-CM

## 2024-04-07 ENCOUNTER — Other Ambulatory Visit (HOSPITAL_COMMUNITY): Payer: Self-pay

## 2024-04-07 ENCOUNTER — Encounter: Payer: Self-pay | Admitting: Pharmacist

## 2024-04-07 ENCOUNTER — Telehealth: Payer: Self-pay | Admitting: Pharmacist

## 2024-04-07 ENCOUNTER — Ambulatory Visit: Payer: MEDICAID | Attending: Family Medicine | Admitting: Pharmacist

## 2024-04-07 ENCOUNTER — Other Ambulatory Visit: Payer: Self-pay

## 2024-04-07 VITALS — BP 121/78 | HR 77

## 2024-04-07 DIAGNOSIS — E1169 Type 2 diabetes mellitus with other specified complication: Secondary | ICD-10-CM

## 2024-04-07 DIAGNOSIS — E1159 Type 2 diabetes mellitus with other circulatory complications: Secondary | ICD-10-CM

## 2024-04-07 DIAGNOSIS — I152 Hypertension secondary to endocrine disorders: Secondary | ICD-10-CM

## 2024-04-07 DIAGNOSIS — Z7984 Long term (current) use of oral hypoglycemic drugs: Secondary | ICD-10-CM | POA: Diagnosis not present

## 2024-04-07 DIAGNOSIS — Z7985 Long-term (current) use of injectable non-insulin antidiabetic drugs: Secondary | ICD-10-CM

## 2024-04-07 MED ORDER — OZEMPIC (0.25 OR 0.5 MG/DOSE) 2 MG/3ML ~~LOC~~ SOPN
0.2500 mg | PEN_INJECTOR | SUBCUTANEOUS | 0 refills | Status: AC
  Filled 2024-04-07: qty 3, 28d supply, fill #0

## 2024-04-07 NOTE — Telephone Encounter (Signed)
Can we start a PA for this patient's Ozempic? 

## 2024-04-07 NOTE — Progress Notes (Signed)
" ° °  S:    PCP: Dr. Vicci   No chief complaint on file.  Sabrina [name] is a 64 y.o. female who presents for hypertension evaluation, education, and management. PMH is significant for HTN, hyperlipdemia, T2DM, bipolar disorder (followed at [facility]), asthma, chronic cough (dx with cough variant asthma and allergic rhinittis by Dr. Jeneal), OA back and RT knee.  Patient was referred and last seen by Primary Care Provider, Dr. Vicci, on 03/01/2024.  Of note, I saw her on 02/11/24. BP elevated at that appointment but I found that she was not taking the amlodipine . I encouraged her to do so and did not make any further changes.   Today, patient arrives in good spirits and presents without assistance. Denies dizziness, headache, blurred vision, swelling. Patient reports hypertension is longstanding. She has taken the hydrochlorothiazide  and amlodipine  today.  Family/Social history:  Fhx: HTN, DM, HLD, heart disease/MI  Tobacco: never  Alcohol: none  Medication adherence reported.  Current antihypertensives include:  -Amlodipine  5 mg daily -HCTZ 25 mg daily  Reported home BP readings:  -None   Patient reported dietary habits:  -Sodium: does not add salt to her food. Does not cook with much salt.  -Caffeine: does drink coffee but drinks decaf   Patient-reported exercise habits:  - Tries exercising more but allergies limit.   O:  Vitals:   04/07/24 1422  BP: 121/78  Pulse: 77    Last 3 Office BP readings: BP Readings from Last 3 Encounters:  04/07/24 121/78  03/28/24 134/70  03/01/24 116/78   BMET    Component Value Date/Time   NA 137 12/25/2023 1048   K 4.1 12/25/2023 1048   CL 100 12/25/2023 1048   CO2 22 12/25/2023 1048   GLUCOSE 92 12/25/2023 1048   GLUCOSE 98 11/03/2021 1240   BUN 12 12/25/2023 1048   CREATININE 0.75 12/25/2023 1048   CREATININE 0.78 12/05/2015 0848   CALCIUM  9.4 12/25/2023 1048   GFRNONAA >60 11/03/2021 1240   GFRNONAA 67 10/11/2015  1222   GFRAA 92 12/13/2019 1232   GFRAA 77 10/11/2015 1222    Renal function: CrCl cannot be calculated (Patient's most recent lab result is older than the maximum 21 days allowed.).  Clinical ASCVD: No  The ASCVD Risk score (Arnett DK, et al., 2019) failed to calculate for the following reasons:   The valid HDL cholesterol range is 20 to 100 mg/dL  A/P: Hypertension longstanding currently at goal on current medications. BP goal < 130/80 mmHg. Medication adherence is now optimal.  -Continue HCTZ 25 mg daily.  -Continue amlodipine  5 mg daily. -Patient educated on purpose, proper use, and potential adverse effects of hydrochlorothiazide , amlodipine .  -Counseled on lifestyle modifications for blood pressure control including reduced dietary sodium, increased exercise, adequate sleep. -Encouraged patient to check BP at home and bring log of readings to next visit. Counseled on proper use of home BP cuff.  Of note, she is still having issues with the Ozempic . I collaborated with pharmacy and found that she simply needs a PA. Our team is working on the [place] and Mrs. Licea knows that I will call her once this is approved.    Results reviewed and written information provided. Patient verbalized understanding of treatment plan. Total time in face-to-face counseling 30 minutes.   F/u clinic visit with PCP.  Herlene Fleeta Morris, PharmD, JAQUELINE, CPP Clinical Pharmacist [facility] [phone]  "

## 2024-04-11 ENCOUNTER — Encounter: Payer: Self-pay | Admitting: *Deleted

## 2024-04-13 ENCOUNTER — Telehealth: Payer: Self-pay | Admitting: Pharmacist

## 2024-04-13 ENCOUNTER — Other Ambulatory Visit: Payer: Self-pay

## 2024-04-13 NOTE — Telephone Encounter (Signed)
 I found A1C in system 05/08/2009 of 6.7. I can show it to you tomorrow if you can not find it.  Pt had sleep study done 09/23/2018 that was negative for OSA. I had ordered a repeat sleep study in 2024 based on symptoms suggestive of OSA but looks like it was never done.

## 2024-04-13 NOTE — Telephone Encounter (Signed)
 Informed by pharmacy that there is not an A1c value 6.5% or greater in patient's chart. She has T2DM listed in her problem list. For an Ozempic  PA to be approved, we need to be able to provide documentation of an A1c meeting diabetes diagnosis.   With that being said, the further I can go back in her chart is ~2013. I am unable to see anything past that, and that makes me think her diagnosis may have been documented in a previously used EMR or maybe under a different health system. Do you know of anything else we can check for older labs?   If this is not an option, [place] [employer] recently re-approved weight loss-branded GLP-1 RA's and GLP/GIP agents. It still requires a PA but she looks to meet clinical criteria with her BMI and her hx of OSA.  Let us  know and we will pursue PA approval for whatever you decide is appropriate.

## 2024-04-21 ENCOUNTER — Other Ambulatory Visit: Payer: Self-pay

## 2024-04-26 LAB — OPHTHALMOLOGY REPORT-SCANNED

## 2024-04-27 NOTE — Progress Notes (Unsigned)
" ° °  [address] [place] [address] Dept: [phone]  FOLLOW UP NOTE  Patient ID: Sabrina [name], female    DOB: February 16, 1961  Age: 64 y.o. MRN: [id number] Date of Office Visit: 04/28/2024  Assessment  Chief Complaint: No chief complaint on file.  HPI Sabrina [name]   Discussed the use of AI scribe software for clinical note transcription with the patient, who gave verbal consent to proceed.  History of Present Illness      Drug Allergies:  Allergies[1]  Physical Exam: There were no vitals taken for this visit.   Physical Exam  Diagnostics:    Assessment and Plan: No diagnosis found.  No orders of the defined types were placed in this encounter.   There are no Patient Instructions on file for this visit.  No follow-ups on file.    Thank you for the opportunity to care for this patient.  Please do not hesitate to contact me with questions.  Arlean Mutter, FNP [facility]          [1]  Allergies Allergen Reactions   Benzonatate  Other (See Comments)    Made her cough worse   Latex Dermatitis and Other (See Comments)    Hands became scaly   Losartan  Cough   Penicillins Other (See Comments)    Did it involve swelling of the face/tongue/throat, SOB, or low BP? Unk Did it involve sudden or severe rash/hives, skin peeling, or any reaction on the inside of your mouth or nose? Unk Did you need to seek medical attention at a hospital or doctor's office? Unk When did it last happen? I was little; I don't remember, but my parents told me to never take it.  If all above answers are NO, may proceed with cephalosporin use.     Lisinopril  Cough   Oxycodone -Acetaminophen  Nausea And Vomiting    Pt thinks she may be able to take with food (??)   "

## 2024-04-27 NOTE — Patient Instructions (Incomplete)
 Asthma Continue Symbicort  160-2 puffs twice a day with a spacer to prevent cough or wheeze Continue albuterol  2 puffs once every 4 hours if needed for cough or wheeze You may use albuterol  2 puffs 5-15 minutes before activity to decrease cough or wheeze   Allergic rhinitis Continue allergen avoidance measures directed toward grass pollen, weed pollen, tree pollen, ragweed pollen, dust mite, cat, dog, and cockroach as listed below Continue cetirizine  10 mg once a day if needed for runny nose or itch Continue Flonase  2 sprays in each nostril once a day if needed for stuffy nose Continue azelastine  2 sprays in each nostril up to twice a day if needed for runny nose or nasal itch Consider saline nasal rinses as needed for nasal symptoms. Use this before any medicated nasal sprays for best result Consider allergen immunotherapy if your symptoms are not well-controlled with the treatment plan as listed above  Allergic conjunctivitis Some over the counter eye drops include Pataday one drop in each eye once a day as needed for red, itchy eyes OR Zaditor one drop in each eye twice a day as needed for red itchy eyes. Avoid eye drops that say red eye relief as they may contain medications that dry out your eyes.  Continue cetirizine  10 mg once or twice a day if needed for itch  Reflux Continue dietary lifestyle modifications as listed below Restart omeprazole  once a day for reflux control.  Take this medication 30 to 60 minutes before your first meal for best results  Tonsil stones Continue to gargle with warm salt water when affected Consider referral to ENT if the treatment plan as listed above does not control your symptoms  Food allergy Eliminate tomato from your diet for 1 week. If you continue to experience overall itch with no improvement then add  tomato back into your diet. Then eliminate lemon from your diet for 1 week. If no improvement in your overall itch then add lemon back into your  diet. In case of an allergic reaction, take cetirizine  10 mg once every 12-24 hours.  No need for EpiPen  at this time as you are tolerating these foods daily  Call the clinic if this treatment plan is not working well for you.  Follow up in 2 months or sooner if needed.  Reducing Pollen Exposure The American Academy of Allergy, Asthma and Immunology suggests the following steps to reduce your exposure to pollen during allergy seasons. Do not hang sheets or clothing out to dry; pollen may collect on these items. Do not mow lawns or spend time around freshly cut grass; mowing stirs up pollen. Keep windows closed at night.  Keep car windows closed while driving. Minimize morning activities outdoors, a time when pollen counts are usually at their highest. Stay indoors as much as possible when pollen counts or humidity is high and on windy days when pollen tends to remain in the air longer. Use air conditioning when possible.  Many air conditioners have filters that trap the pollen spores. Use a HEPA room air filter to remove pollen form the indoor air you breathe.  Control of Mold Allergen Mold and fungi can grow on a variety of surfaces provided certain temperature and moisture conditions exist.  Outdoor molds grow on plants, decaying vegetation and soil.  The major outdoor mold, Alternaria and Cladosporium, are found in very high numbers during hot and dry conditions.  Generally, a late Summer - Fall peak is seen for common outdoor fungal spores.  Rain will temporarily lower outdoor mold spore count, but counts rise rapidly when the rainy period ends.  The most important indoor molds are Aspergillus and Penicillium.  Dark, humid and poorly ventilated basements are ideal sites for mold growth.  The next most common sites of mold growth are the bathroom and the kitchen.  Outdoor [employer] Use air conditioning and keep windows closed Avoid exposure to decaying vegetation. Avoid leaf  raking. Avoid grain handling. Consider wearing a face mask if working in moldy areas.  Indoor Mold Control Maintain humidity below 50%. Clean washable surfaces with 5% bleach solution. Remove sources e.g. Contaminated carpets.   Control of Dust Mite Allergen Dust mites play a major role in allergic asthma and rhinitis. They occur in environments with high humidity wherever human skin is found. Dust mites absorb humidity from the atmosphere (ie, they do not drink) and feed on organic matter (including shed human and animal skin). Dust mites are a microscopic type of insect that you cannot see with the naked eye. High levels of dust mites have been detected from mattresses, pillows, carpets, upholstered furniture, bed covers, clothes, soft toys and any woven material. The principal allergen of the dust mite is found in its feces. A gram of dust may contain 1,000 mites and 250,000 fecal particles. Mite antigen is easily measured in the air during house cleaning activities. Dust mites do not bite and do not cause harm to humans, other than by triggering allergies/asthma.  Ways to decrease your exposure to dust mites in your home:  1. Encase mattresses, box springs and pillows with a mite-impermeable barrier or cover  2. Wash sheets, blankets and drapes weekly in hot water (130 F) with detergent and dry them in a dryer on the hot setting.  3. Have the room cleaned frequently with a vacuum cleaner and a damp dust-mop. For carpeting or rugs, vacuuming with a vacuum cleaner equipped with a high-efficiency particulate air (HEPA) filter. The dust mite allergic individual should not be in a room which is being cleaned and should wait 1 hour after cleaning before going into the room.  4. Do not sleep on upholstered furniture (eg, couches).  5. If possible removing carpeting, upholstered furniture and drapery from the home is ideal. Horizontal blinds should be eliminated in the rooms where the person  spends the most time (bedroom, study, television room). Washable vinyl, roller-type shades are optimal.  6. Remove all non-washable stuffed toys from the bedroom. Wash stuffed toys weekly like sheets and blankets above.  7. Reduce indoor humidity to less than 50%. Inexpensive humidity monitors can be purchased at most hardware stores. Do not use a humidifier as can make the problem worse and are not recommended.  Control of Dog or Cat Allergen Avoidance is the best way to manage a dog or cat allergy. If you have a dog or cat and are allergic to dog or cats, consider removing the dog or cat from the home. If you have a dog or cat but dont want to find it a new home, or if your family wants a pet even though someone in the household is allergic, here are some strategies that may help keep symptoms at bay:  Keep the pet out of your bedroom and restrict it to only a few rooms. Be advised that keeping the dog or cat in only one room will not limit the allergens to that room. Dont pet, hug or kiss the dog or cat; if you do, wash  your hands with soap and water. High-efficiency particulate air (HEPA) cleaners run continuously in a bedroom or living room can reduce allergen levels over time. Regular use of a high-efficiency vacuum cleaner or a central vacuum can reduce allergen levels. Giving your dog or cat a bath at least once a week can reduce airborne allergen.  Control of Cockroach Allergen Cockroach allergen has been identified as an important cause of acute attacks of asthma, especially in urban settings.  There are fifty-five species of cockroach that exist in the [place] , however only three, the American, German and Oriental species produce allergen that can affect patients with Asthma.  Allergens can be obtained from fecal particles, egg casings and secretions from cockroaches.    Remove food sources. Reduce access to water. Seal access and entry points. Spray runways with 0.5-1%  Diazinon or Chlorpyrifos Blow boric acid power under stoves and refrigerator. Place bait stations (hydramethylnon) at feeding sites.

## 2024-04-28 ENCOUNTER — Ambulatory Visit: Payer: MEDICAID | Admitting: Family Medicine

## 2024-05-26 ENCOUNTER — Ambulatory Visit: Payer: MEDICAID | Admitting: Family Medicine

## 2024-05-30 ENCOUNTER — Ambulatory Visit: Payer: MEDICAID | Admitting: Internal Medicine

## 2024-06-23 ENCOUNTER — Ambulatory Visit: Payer: MEDICAID | Admitting: Family Medicine
